# Patient Record
Sex: Female | Born: 1935 | Race: White | Hispanic: No | Smoking: Never smoker
Health system: Southern US, Community
[De-identification: ages and names within clinical notes are randomized; demographics above are authoritative.]

## PROBLEM LIST (undated history)

## (undated) DIAGNOSIS — D649 Anemia, unspecified: Secondary | ICD-10-CM

## (undated) DIAGNOSIS — H409 Unspecified glaucoma: Secondary | ICD-10-CM

## (undated) DIAGNOSIS — I6529 Occlusion and stenosis of unspecified carotid artery: Secondary | ICD-10-CM

## (undated) DIAGNOSIS — M810 Age-related osteoporosis without current pathological fracture: Secondary | ICD-10-CM

## (undated) DIAGNOSIS — N390 Urinary tract infection, site not specified: Secondary | ICD-10-CM

## (undated) DIAGNOSIS — M1990 Unspecified osteoarthritis, unspecified site: Secondary | ICD-10-CM

## (undated) DIAGNOSIS — E7800 Pure hypercholesterolemia, unspecified: Secondary | ICD-10-CM

## (undated) DIAGNOSIS — R55 Syncope and collapse: Secondary | ICD-10-CM

## (undated) DIAGNOSIS — I10 Essential (primary) hypertension: Secondary | ICD-10-CM

## (undated) DIAGNOSIS — J189 Pneumonia, unspecified organism: Secondary | ICD-10-CM

## (undated) DIAGNOSIS — H269 Unspecified cataract: Secondary | ICD-10-CM

## (undated) HISTORY — DX: Unspecified glaucoma: H40.9

## (undated) HISTORY — DX: Pneumonia, unspecified organism: J18.9

## (undated) HISTORY — DX: Anemia, unspecified: D64.9

## (undated) HISTORY — DX: Pure hypercholesterolemia, unspecified: E78.00

## (undated) HISTORY — DX: Unspecified osteoarthritis, unspecified site: M19.90

## (undated) HISTORY — DX: Urinary tract infection, site not specified: N39.0

## (undated) HISTORY — PX: APPENDECTOMY: SHX54

## (undated) HISTORY — PX: EYE SURGERY: SHX253

## (undated) HISTORY — DX: Syncope and collapse: R55

## (undated) HISTORY — DX: Unspecified cataract: H26.9

## (undated) HISTORY — DX: Age-related osteoporosis without current pathological fracture: M81.0

## (undated) HISTORY — DX: Essential (primary) hypertension: I10

## (undated) HISTORY — DX: Occlusion and stenosis of unspecified carotid artery: I65.29

---

## 1967-03-09 HISTORY — PX: ABDOMINAL HYSTERECTOMY: SHX81

## 1997-07-15 ENCOUNTER — Other Ambulatory Visit: Admission: RE | Admit: 1997-07-15 | Discharge: 1997-07-15 | Payer: Self-pay | Admitting: *Deleted

## 2003-10-10 ENCOUNTER — Ambulatory Visit (HOSPITAL_COMMUNITY): Admission: RE | Admit: 2003-10-10 | Discharge: 2003-10-10 | Payer: Self-pay | Admitting: Endocrinology

## 2011-02-10 ENCOUNTER — Ambulatory Visit (INDEPENDENT_AMBULATORY_CARE_PROVIDER_SITE_OTHER): Payer: MEDICARE

## 2011-02-10 DIAGNOSIS — J069 Acute upper respiratory infection, unspecified: Secondary | ICD-10-CM

## 2011-02-10 DIAGNOSIS — H6120 Impacted cerumen, unspecified ear: Secondary | ICD-10-CM

## 2011-11-28 ENCOUNTER — Ambulatory Visit (INDEPENDENT_AMBULATORY_CARE_PROVIDER_SITE_OTHER): Payer: MEDICARE | Admitting: Family Medicine

## 2011-11-28 ENCOUNTER — Ambulatory Visit: Payer: MEDICARE

## 2011-11-28 VITALS — BP 148/80 | HR 97 | Temp 98.0°F | Resp 16 | Ht 61.5 in | Wt 145.0 lb

## 2011-11-28 DIAGNOSIS — M545 Low back pain, unspecified: Secondary | ICD-10-CM

## 2011-11-28 DIAGNOSIS — R82998 Other abnormal findings in urine: Secondary | ICD-10-CM

## 2011-11-28 DIAGNOSIS — R300 Dysuria: Secondary | ICD-10-CM

## 2011-11-28 DIAGNOSIS — R8281 Pyuria: Secondary | ICD-10-CM

## 2011-11-28 LAB — POCT URINALYSIS DIPSTICK
Blood, UA: NEGATIVE
Glucose, UA: NEGATIVE
Nitrite, UA: NEGATIVE
Spec Grav, UA: >=1.03
Urobilinogen, UA: 0.2
pH, UA: 5.5

## 2011-11-28 LAB — POCT UA - MICROSCOPIC ONLY
Casts, Ur, LPF, POC: NEGATIVE
Crystals, Ur, HPF, POC: NEGATIVE
Epithelial cells, urine per micros: NEGATIVE
Mucus, UA: NEGATIVE
RBC, urine, microscopic: NEGATIVE
Yeast, UA: NEGATIVE

## 2011-11-28 MED ORDER — VALACYCLOVIR HCL 1 G PO TABS: 1000.0000 mg | ORAL_TABLET | Freq: Three times a day (TID) | ORAL | Status: DC

## 2011-11-28 NOTE — Patient Instructions (Signed)
Your low back pain is likely a flair of arthritis in the back.  You can take tylenol over the counter, or if stronger med needed, 1/2 to 1 of your Vicoprofen up to every 4 to 6 hours.  Drink plenty of fluids. If any rash - start Valtrex for possible shingles. Your should receive a call or letter about your lab results within the next week to 10 days. Return to the clinic or go to the nearest emergency room if any of your symptoms worsen or new symptoms occur.

## 2011-11-28 NOTE — Progress Notes (Signed)
Subjective:    Patient ID: Lisa [name], female    DOB: May 26, 1923, 78 y.o.   MRN: [id number]  HPI Lisa [name] is a 78 y.o. female Woke up 2 mornings ago - low back ache - across both sides lower back. Slight discomfort.  Took tylenol that afternoon - few episodes.  Localized pain that night to L side - worsened. Up and down that night.  Took ibuprofen that night. Yesterday - took vicoprofen at bedtime (leftover from dental problem).   S/p 2 doses.  Last one at around 3 am.  Pain has subsided for most part. No pain now.  Few ibuprofen this am. No bowel or bladder incontinence, no saddle anesthesia, no lower extremity weakness.  No rash, no burning or tingling on skin. Felt like muscle spasm type of pain.  Hx of shingles 5 or 6 years ago, but has had zostavax few years ago. This pain felt similar to shingles sx's in past, but no rash.   No known hx of compression fractures, but hx of Osteoporosis - taken Evista for few years.  No known hx of kidney stones.   Review of Systems  Constitutional: Negative for fever and chills.  Respiratory: Negative for chest tightness and shortness of breath.   Cardiovascular: Negative for chest pain.  Gastrointestinal: Negative for abdominal pain, blood in stool and abdominal distention.  Genitourinary: Positive for frequency (chronic - but may be going a little more than usual.). Negative for dysuria, urgency, hematuria, flank pain, vaginal bleeding and difficulty urinating.  Neurological: Negative for weakness.       Objective:   Physical Exam  Constitutional: She is oriented to person, place, and time. She appears well-developed and well-nourished. No distress.  HENT:  Head: Normocephalic and atraumatic.  Cardiovascular: Normal rate, regular rhythm, normal heart sounds and intact distal pulses.   Pulmonary/Chest: Effort normal and breath sounds normal. No respiratory distress.  Abdominal: Soft. There is no tenderness.  Musculoskeletal: Normal  range of motion.       Lumbar back: She exhibits normal range of motion, no tenderness, no bony tenderness, no swelling, no edema, no pain and no spasm.       Back:  Neurological: She is alert and oriented to person, place, and time. She has normal strength. No sensory deficit.  Reflex Scores:      Patellar reflexes are 2+ on the right side and 2+ on the left side.      Achilles reflexes are 2+ on the right side and 2+ on the left side. Skin: Skin is warm and dry. No rash noted. No erythema.  Psychiatric: She has a normal mood and affect. Her behavior is normal.      Results for orders placed in visit on 11/28/11  POCT UA - MICROSCOPIC ONLY      Component Value Range   WBC, Ur, HPF, POC 2-6     RBC, urine, microscopic neg     Bacteria, U Microscopic trace     Mucus, UA neg     Epithelial cells, urine per micros neg     Crystals, Ur, HPF, POC neg     Casts, Ur, LPF, POC neg     Yeast, UA neg    POCT URINALYSIS DIPSTICK      Component Value Range   Color, UA yellow     Clarity, UA clear     Glucose, UA neg     Bilirubin, UA small     Ketones, UA trace  Spec Grav, UA >=1.030     Blood, UA neg     pH, UA 5.5     Protein, UA trace     Urobilinogen, UA 0.2     Nitrite, UA neg     Leukocytes, UA small (1+)     [facility] reading (PRIMARY) by  Dr. Neva Seat: DDD, R apex scoliosis of lumbar spine.  Likely degenerative retrolisthesis of L5 - Grade 1.      Assessment & Plan:  Lisa [name] is a 78 y.o. female 1. Dysuria  POCT UA - Microscopic Only, POCT urinalysis dipstick  2. Low back pain  DG Lumbar Spine Complete, valACYclovir (VALTREX) 1000 MG tablet  3. Pyuria  Urine culture    Low back pain - with progression to L side.  No rash and hx of Zostavax makes zoster less likely, but if any rash noted - start VAltrex.  Likely DDD flair with degerative spondylolisthesis. Pain much improved in office. . Start with otc tylenol, sx care, but if increased pain - has Vicoprfen 7.5/200 -  1/2 to 1 Q 4-6 hrs prn.  Only slight increase in urinary frequency, Unlikely UTI, but will check urine cx. Hold on abx's for now. RTC precautions. All questions answered.    Patient Instructions  Your low back pain is likely a flair of arthritis in the back.  You can take tylenol over the counter, or if stronger med needed, 1/2 to 1 of your Vicoprofen up to every 4 to 6 hours.  Drink plenty of fluids. If any rash - start Valtrex for possible shingles. Your should receive a call or letter about your lab results within the next week to 10 days. Return to the clinic or go to the nearest emergency room if any of your symptoms worsen or new symptoms occur.

## 2011-11-30 LAB — URINE CULTURE
Colony Count: NO GROWTH
Organism ID, Bacteria: NO GROWTH

## 2013-01-31 ENCOUNTER — Ambulatory Visit (HOSPITAL_COMMUNITY)
Admission: RE | Admit: 2013-01-31 | Discharge: 2013-01-31 | Disposition: A | Discharge status: Home / Self Care | Payer: Medicare Other | Source: Ambulatory Visit | Attending: Family Medicine | Admitting: Family Medicine

## 2013-01-31 ENCOUNTER — Other Ambulatory Visit: Payer: Self-pay | Admitting: Physician Assistant

## 2013-01-31 ENCOUNTER — Ambulatory Visit (INDEPENDENT_AMBULATORY_CARE_PROVIDER_SITE_OTHER): Payer: Medicare Other | Admitting: Family Medicine

## 2013-01-31 ENCOUNTER — Other Ambulatory Visit (HOSPITAL_COMMUNITY): Payer: Self-pay | Admitting: Physician Assistant

## 2013-01-31 VITALS — BP 124/74 | HR 94 | Temp 98.4°F | Resp 18 | Wt 138.0 lb

## 2013-01-31 DIAGNOSIS — R7989 Other specified abnormal findings of blood chemistry: Secondary | ICD-10-CM

## 2013-01-31 DIAGNOSIS — M79604 Pain in right leg: Secondary | ICD-10-CM

## 2013-01-31 DIAGNOSIS — R600 Localized edema: Secondary | ICD-10-CM

## 2013-01-31 DIAGNOSIS — M7989 Other specified soft tissue disorders: Secondary | ICD-10-CM

## 2013-01-31 DIAGNOSIS — R799 Abnormal finding of blood chemistry, unspecified: Secondary | ICD-10-CM | POA: Insufficient documentation

## 2013-01-31 DIAGNOSIS — R609 Edema, unspecified: Secondary | ICD-10-CM | POA: Insufficient documentation

## 2013-01-31 LAB — POCT CBC
Granulocyte percent: 74.4 %G (ref 37–80)
HCT, POC: 36.4 % — AB (ref 37.7–47.9)
Hemoglobin: 11.3 g/dL — AB (ref 12.2–16.2)
Lymph, poc: 1 (ref 0.6–3.4)
MCH, POC: 32 pg — AB (ref 27–31.2)
MCHC: 31 g/dL — AB (ref 31.8–35.4)
MCV: 103 fL — AB (ref 80–97)
MID (cbc): 0.3 (ref 0–0.9)
MPV: 8.6 fL (ref 0–99.8)
POC Granulocyte: 3.8 (ref 2–6.9)
POC LYMPH PERCENT: 19.6 %L (ref 10–50)
POC MID %: 6 %M (ref 0–12)
Platelet Count, POC: 178 10*3/uL (ref 142–424)
RBC: 3.53 M/uL — AB (ref 4.04–5.48)
RDW, POC: 14.6 %
WBC: 5.1 10*3/uL (ref 4.6–10.2)

## 2013-01-31 LAB — D-DIMER, QUANTITATIVE: D-Dimer, Quant: 0.56 ug/mL-FEU — ABNORMAL HIGH (ref 0.00–0.48)

## 2013-01-31 NOTE — Progress Notes (Signed)
VASCULAR LAB PRELIMINARY  PRELIMINARY  PRELIMINARY  PRELIMINARY  Bilateral lower extremity venous duplex completed.    Preliminary report:  Bilateral:  No evidence of DVT, superficial thrombosis, or Baker's Cyst.   Jacklynn Dehaas, RVS 01/31/2013, 7:54 PM

## 2013-01-31 NOTE — Patient Instructions (Signed)
Wear the compression stockings  Keep legs elevated  Avoid excessive salt  Return if worse or so your primary physician

## 2013-01-31 NOTE — Progress Notes (Signed)
Subjective: Patient was at the beach last week, and probably got more salt in her diet computer. She's been home for a few days, but after a day or 2 at home she started having some edema her left leg. It goes down overnight, but swells in the daytime. She has not had swelling there in the past. She's not had any shortness of breath or chest pain or other major symptoms. Her daughter is with her today.  Objective: Alert pleasant lady. No JVD. Chest clear. Heart regular without murmurs. She has 2+ edema of her left ankle, 1+ on the right. Feet are warm and dry. Calves and thighs are nontender. No erythema.  Assessment: Left leg edema, new onset   Plan:  CBC D-dimer If her d-dimer is elevated she will need to get a Doppler ultrasound of her leg. Have no suspicion of pulmonary issues.

## 2013-11-23 ENCOUNTER — Other Ambulatory Visit: Payer: Self-pay | Admitting: Family Medicine

## 2013-11-23 DIAGNOSIS — R0989 Other specified symptoms and signs involving the circulatory and respiratory systems: Secondary | ICD-10-CM

## 2013-12-06 ENCOUNTER — Ambulatory Visit
Admission: RE | Admit: 2013-12-06 | Discharge: 2013-12-06 | Disposition: A | Discharge status: Home / Self Care | Payer: Medicare Other | Source: Ambulatory Visit | Attending: Family Medicine | Admitting: Family Medicine

## 2013-12-06 DIAGNOSIS — R0989 Other specified symptoms and signs involving the circulatory and respiratory systems: Secondary | ICD-10-CM

## 2013-12-14 ENCOUNTER — Encounter: Payer: Self-pay | Admitting: Vascular Surgery

## 2013-12-19 ENCOUNTER — Encounter: Payer: Self-pay | Admitting: Vascular Surgery

## 2013-12-20 ENCOUNTER — Encounter: Payer: Self-pay | Admitting: Vascular Surgery

## 2013-12-20 ENCOUNTER — Ambulatory Visit (INDEPENDENT_AMBULATORY_CARE_PROVIDER_SITE_OTHER): Payer: Medicare Other | Admitting: Vascular Surgery

## 2013-12-20 VITALS — BP 123/67 | HR 86 | Resp 14 | Ht 61.0 in | Wt 127.0 lb

## 2013-12-20 DIAGNOSIS — I6523 Occlusion and stenosis of bilateral carotid arteries: Secondary | ICD-10-CM

## 2013-12-20 DIAGNOSIS — I6529 Occlusion and stenosis of unspecified carotid artery: Secondary | ICD-10-CM | POA: Insufficient documentation

## 2013-12-20 DIAGNOSIS — I872 Venous insufficiency (chronic) (peripheral): Secondary | ICD-10-CM

## 2013-12-20 NOTE — Addendum Note (Signed)
Addended by: Sharee PimpleMCCHESNEY, Renley Gutman K on: 12/20/2013 03:33 PM   Modules accepted: Orders

## 2013-12-20 NOTE — Progress Notes (Signed)
New Carotid Patient  Referred by:  Junious SilkMichael D Altheimer, MD [facility] [place], [place] [id number]  Reason for referral: B carotid stenosis  History of Present Illness  Lisa [name] is a 80 y.o. (October 23, 1923) female who presents with chief complaint: abnormal carotid studies.  Previous carotid studies demonstrated: RICA <50% stenosis, LICA <50% stenosis.   Pt had B carotid duplex completed due to R carotid bruit.  Patient has no history of TIA or stroke symptom.  The patient has never had amaurosis fugax or monocular blindness.  The patient has never had facial drooping or hemiplegia.  The patient has never had receptive or expressive aphasia.   The patient's risks factors for carotid disease include: HTN, HLD.    Past Medical History  Diagnosis Date  . Arthritis   . Cataract   . Glaucoma   . Hypertension   . Osteoporosis    Hyperlipidemia   Past Surgical History  Procedure Laterality Date  . Appendectomy    . Eye surgery    . Abdominal hysterectomy      History   Social History  . Marital Status: Widowed    Spouse Name: N/A    Number of Children: N/A  . Years of Education: N/A   Occupational History  . Not on file.   Social History Main Topics  . Smoking status: Never Smoker   . Smokeless tobacco: Never Used  . Alcohol Use: No  . Drug Use: No  . Sexual Activity: Not on file   Other Topics Concern  . Not on file   Social History Narrative  . No narrative on file    Family History  Problem Relation Age of Onset  . Hypertension Mother   . Heart disease Mother   . Hyperlipidemia Brother   . Hypertension Brother     Current Outpatient Prescriptions on File Prior to Visit  Medication Sig Dispense Refill  . amLODipine (NORVASC) 2.5 MG tablet Take 2.5 mg by mouth daily.      . calcium carbonate (OS-CAL) 600 MG TABS Take 600 mg by mouth 2 (two) times daily with a meal.      . ezetimibe-simvastatin (VYTORIN) 10-40 MG per tablet Take 1 tablet by  mouth at bedtime.      . Multiple Vitamin (MULTIVITAMIN) tablet Take 1 tablet by mouth daily.      . raloxifene (EVISTA) 60 MG tablet Take 60 mg by mouth daily.      Marland Kitchen. telmisartan (MICARDIS) 80 MG tablet Take 80 mg by mouth daily.      . timolol (BETIMOL) 0.25 % ophthalmic solution Place 1-2 drops into both eyes daily.      . valACYclovir (VALTREX) 1000 MG tablet Take 1 tablet (1,000 mg total) by mouth 3 (three) times daily.  21 tablet  0   No current facility-administered medications on file prior to visit.    Allergies  Allergen Reactions  . Mycostatin [Nystatin] Swelling and Rash    REVIEW OF SYSTEMS:  (Positives checked otherwise negative)  CARDIOVASCULAR:  []  chest pain, []  chest pressure, []  palpitations, []  shortness of breath when laying flat, []  shortness of breath with exertion,  []  pain in feet when walking, []  pain in feet when laying flat, []  history of blood clot in veins (DVT), []  history of phlebitis, [x]  swelling in legs, [x]  varicose veins  PULMONARY:  []  productive cough, []  asthma, []  wheezing  NEUROLOGIC:  []  weakness in arms or legs, [x]  numbness in  arms or legs, []  difficulty speaking or slurred speech, []  temporary loss of vision in one eye, []  dizziness  HEMATOLOGIC:  []  bleeding problems, []  problems with blood clotting too easily  MUSCULOSKEL:  []  joint pain, []  joint swelling  GASTROINTEST:  []  vomiting blood, []  blood in stool     GENITOURINARY:  []  burning with urination, []  blood in urine  PSYCHIATRIC:  []  history of major depression  INTEGUMENTARY:  []  rashes, []  ulcers  CONSTITUTIONAL:  []  fever, []  chills  For VQI Use Only  PRE-ADM LIVING: Home  AMB STATUS: Ambulatory  CAD Sx: None  PRIOR CHF: None  STRESS TEST: [x]  No, [ ]  Normal, [ ]  + ischemia, [ ]  + MI, [ ]  Both  Physical Examination  Filed Vitals:   12/20/13 1357 12/20/13 1358  BP: 138/64 123/67  Pulse: 89 86  Resp: 14   Height: 5\' 1"  (1.549 m)   Weight: 127 lb (57.607  kg)    Body mass index is 24.01 kg/(m^2).  General: A&O x 3, WDWN  Head: [place]/AT  Ear/Nose/Throat: Hearing grossly intact, nares w/o erythema or drainage, oropharynx w/o Erythema/Exudate, Mallampati score: 2  Eyes: PERRLA, EOMI, Post surg chg to lenses  Neck: Supple, no nuchal rigidity, no palpable LAD  Pulmonary: Sym exp, good air movt, CTAB, no rales, rhonchi, & wheezing  Cardiac: RRR, Nl S1, S2, no Murmurs, rubs or gallops  Vascular: Vessel Right Left  Radial Faintly Palpable Faintly Palpable  Brachial Palpable Palpable  Carotid Palpable, without bruit Palpable, without bruit  Aorta  Not palpable N/A  Femoral Palpable Palpable  Popliteal Not palpable Not palpable  PT Not Palpable Not Palpable  DP Faintly Palpable Faintly Palpable   Gastrointestinal: soft, NTND, -G/R, - HSM, - masses, - CVAT B  Musculoskeletal: M/S 5/5 throughout , Extremities without ischemic changes , B varicose veins and spider veins, no LDS  Neurologic: CN 2-12 intact , Pain and light touch intact in extremities , Motor exam as listed above  Psychiatric: Judgment intact, Mood & affect appropriate for pt's clinical situation  Dermatologic: See M/S exam for extremity exam, no rashes otherwise noted  Lymph : No Cervical, Axillary, or Inguinal lymphadenopathy   Non-Invasive Vascular Imaging  Outside CAROTID DUPLEX (Date: 12/20/2013):  B carotid have plaque but no hemodynamically significant disease  Outside Studies/Documentation 8 pages of outside documents were reviewed including: outside carotid duplex, PCP chart.  Medical Decision Making  Lisa Canardancy O Proctor is a 80 y.o. female who presents with: asx B ICA stenosis <50%, chronic venous insufficiency (C2)   No evidence for any intervention in any patient with <50% carotid stenosis .  Based on the patient's vascular studies and examination, I have offered the patient: Annual B carotid duplex.  I discussed in depth with the patient the nature of  atherosclerosis, and emphasized the importance of maximal medical management including strict control of blood pressure, blood glucose, and lipid levels, obtaining regular exercise, antiplatelet agents, and cessation of smoking.   The patient is currently on a statin: Vytorin.  The patient is currently on an anti-platelet: ASA.  The patient is aware that without maximal medical management the underlying atherosclerotic disease process will progress, limiting the benefit of any interventions.  Thank you for allowing us to participate in this patient's care.  Leonides SakeBrian Jaycelyn Orrison, MD [facility] Office: [phone] Pager: [phone]  12/20/2013, 2:50 PM

## 2014-07-03 ENCOUNTER — Emergency Department (HOSPITAL_COMMUNITY): Payer: Medicare Other

## 2014-07-03 ENCOUNTER — Inpatient Hospital Stay (HOSPITAL_COMMUNITY)
Admission: EM | Admit: 2014-07-03 | Discharge: 2014-07-07 | DRG: 193 | Disposition: A | Discharge status: Skilled Nursing Facility | Payer: Medicare Other | Attending: Internal Medicine | Admitting: Internal Medicine

## 2014-07-03 ENCOUNTER — Encounter (HOSPITAL_COMMUNITY): Payer: Self-pay | Admitting: Emergency Medicine

## 2014-07-03 DIAGNOSIS — M810 Age-related osteoporosis without current pathological fracture: Secondary | ICD-10-CM | POA: Diagnosis present

## 2014-07-03 DIAGNOSIS — J9601 Acute respiratory failure with hypoxia: Secondary | ICD-10-CM | POA: Diagnosis present

## 2014-07-03 DIAGNOSIS — R42 Dizziness and giddiness: Secondary | ICD-10-CM

## 2014-07-03 DIAGNOSIS — E440 Moderate protein-calorie malnutrition: Secondary | ICD-10-CM | POA: Diagnosis present

## 2014-07-03 DIAGNOSIS — H409 Unspecified glaucoma: Secondary | ICD-10-CM | POA: Diagnosis present

## 2014-07-03 DIAGNOSIS — R5381 Other malaise: Secondary | ICD-10-CM

## 2014-07-03 DIAGNOSIS — I6529 Occlusion and stenosis of unspecified carotid artery: Secondary | ICD-10-CM | POA: Diagnosis present

## 2014-07-03 DIAGNOSIS — D649 Anemia, unspecified: Secondary | ICD-10-CM | POA: Diagnosis present

## 2014-07-03 DIAGNOSIS — J189 Pneumonia, unspecified organism: Secondary | ICD-10-CM | POA: Diagnosis not present

## 2014-07-03 DIAGNOSIS — I10 Essential (primary) hypertension: Secondary | ICD-10-CM | POA: Diagnosis present

## 2014-07-03 DIAGNOSIS — E860 Dehydration: Secondary | ICD-10-CM | POA: Diagnosis present

## 2014-07-03 DIAGNOSIS — R531 Weakness: Secondary | ICD-10-CM | POA: Diagnosis not present

## 2014-07-03 DIAGNOSIS — Z8249 Family history of ischemic heart disease and other diseases of the circulatory system: Secondary | ICD-10-CM

## 2014-07-03 DIAGNOSIS — R0902 Hypoxemia: Secondary | ICD-10-CM

## 2014-07-03 DIAGNOSIS — R109 Unspecified abdominal pain: Secondary | ICD-10-CM

## 2014-07-03 DIAGNOSIS — E785 Hyperlipidemia, unspecified: Secondary | ICD-10-CM | POA: Diagnosis present

## 2014-07-03 HISTORY — DX: Pneumonia, unspecified organism: J18.9

## 2014-07-03 LAB — URINALYSIS, ROUTINE W REFLEX MICROSCOPIC
Bilirubin Urine: NEGATIVE
Glucose, UA: NEGATIVE mg/dL
Hgb urine dipstick: NEGATIVE
Ketones, ur: 15 mg/dL — AB
Nitrite: NEGATIVE
Protein, ur: NEGATIVE mg/dL
Specific Gravity, Urine: 1.026 (ref 1.005–1.030)
Urobilinogen, UA: 0.2 mg/dL (ref 0.0–1.0)
pH: 5 (ref 5.0–8.0)

## 2014-07-03 LAB — COMPREHENSIVE METABOLIC PANEL
ALT: 24 U/L (ref 0–35)
AST: 23 U/L (ref 0–37)
Albumin: 3.4 g/dL — ABNORMAL LOW (ref 3.5–5.2)
Alkaline Phosphatase: 59 U/L (ref 39–117)
Anion gap: 7 (ref 5–15)
BUN: 23 mg/dL (ref 6–23)
CO2: 25 mmol/L (ref 19–32)
Calcium: 8.2 mg/dL — ABNORMAL LOW (ref 8.4–10.5)
Chloride: 104 mmol/L (ref 96–112)
Creatinine, Ser: 0.81 mg/dL (ref 0.50–1.10)
GFR calc Af Amer: 73 mL/min — ABNORMAL LOW (ref >90)
GFR calc non Af Amer: 63 mL/min — ABNORMAL LOW (ref >90)
Glucose, Bld: 163 mg/dL — ABNORMAL HIGH (ref 70–99)
Potassium: 4.3 mmol/L (ref 3.5–5.1)
Sodium: 136 mmol/L (ref 135–145)
Total Bilirubin: 0.4 mg/dL (ref 0.3–1.2)
Total Protein: 5.4 g/dL — ABNORMAL LOW (ref 6.0–8.3)

## 2014-07-03 LAB — CBC WITH DIFFERENTIAL/PLATELET
Basophils Absolute: 0 10*3/uL (ref 0.0–0.1)
Basophils Relative: 0 % (ref 0–1)
Eosinophils Absolute: 0 10*3/uL (ref 0.0–0.7)
Eosinophils Relative: 0 % (ref 0–5)
HCT: 30.7 % — ABNORMAL LOW (ref 36.0–46.0)
Hemoglobin: 10.4 g/dL — ABNORMAL LOW (ref 12.0–15.0)
Lymphocytes Relative: 5 % — ABNORMAL LOW (ref 12–46)
Lymphs Abs: 0.4 10*3/uL — ABNORMAL LOW (ref 0.7–4.0)
MCH: 32.4 pg (ref 26.0–34.0)
MCHC: 33.9 g/dL (ref 30.0–36.0)
MCV: 95.6 fL (ref 78.0–100.0)
Monocytes Absolute: 0.2 10*3/uL (ref 0.1–1.0)
Monocytes Relative: 2 % — ABNORMAL LOW (ref 3–12)
Neutro Abs: 7.1 10*3/uL (ref 1.7–7.7)
Neutrophils Relative %: 93 % — ABNORMAL HIGH (ref 43–77)
Platelets: 156 10*3/uL (ref 150–400)
RBC: 3.21 MIL/uL — ABNORMAL LOW (ref 3.87–5.11)
RDW: 12.7 % (ref 11.5–15.5)
WBC: 7.7 10*3/uL (ref 4.0–10.5)

## 2014-07-03 LAB — URINE MICROSCOPIC-ADD ON

## 2014-07-03 LAB — TROPONIN I: Troponin I: <0.03 ng/mL (ref <0.031)

## 2014-07-03 MED ORDER — SODIUM CHLORIDE 0.9 % IV BOLUS (SEPSIS)
1000.0000 mL | Freq: Once | INTRAVENOUS | Status: AC
  Administered 2014-07-03: 1000 mL via INTRAVENOUS

## 2014-07-03 MED ORDER — ASPIRIN 81 MG PO CHEW
324.0000 mg | CHEWABLE_TABLET | Freq: Once | ORAL | Status: AC
  Administered 2014-07-03: 324 mg via ORAL
  Filled 2014-07-03: qty 4

## 2014-07-03 MED ORDER — ONDANSETRON HCL 4 MG/2ML IJ SOLN
4.0000 mg | Freq: Once | INTRAMUSCULAR | Status: AC
  Administered 2014-07-03: 4 mg via INTRAVENOUS
  Filled 2014-07-03: qty 2

## 2014-07-03 MED ORDER — AZITHROMYCIN 500 MG IV SOLR
500.0000 mg | Freq: Once | INTRAVENOUS | Status: DC
  Administered 2014-07-04: 500 mg via INTRAVENOUS
  Filled 2014-07-03: qty 500

## 2014-07-03 MED ORDER — CEFTRIAXONE SODIUM 1 G IJ SOLR
1.0000 g | Freq: Once | INTRAMUSCULAR | Status: AC
  Administered 2014-07-04: 1 g via INTRAVENOUS
  Filled 2014-07-03: qty 10

## 2014-07-03 NOTE — ED Provider Notes (Signed)
CSN: [id number]     Arrival date & time 07/03/14  1712 History   First MD Initiated Contact with Patient 07/03/14 1741     Chief Complaint  Patient presents with  . Weakness     (Consider location/radiation/quality/duration/timing/severity/associated sxs/prior Treatment) Patient is a 81 y.o. female presenting with weakness.  Weakness This is a new problem. The current episode started 3 to 5 hours ago. The problem occurs constantly. The problem has not changed since onset.Pertinent negatives include no chest pain, no abdominal pain, no headaches and no shortness of breath. Associated symptoms comments: Nausea, vomiting x 2, lightheadedness.. Exacerbated by: standing up. Nothing relieves the symptoms. Treatments tried: zofran given by EMS. The treatment provided moderate relief.    Past Medical History  Diagnosis Date  . Arthritis   . Cataract   . Glaucoma   . Hypertension   . Osteoporosis    Past Surgical History  Procedure Laterality Date  . Appendectomy    . Eye surgery    . Abdominal hysterectomy     Family History  Problem Relation Age of Onset  . Hypertension Mother   . Heart disease Mother   . Hyperlipidemia Brother   . Hypertension Brother    History  Substance Use Topics  . Smoking status: Never Smoker   . Smokeless tobacco: Never Used  . Alcohol Use: No   OB History    No data available     Review of Systems  Respiratory: Negative for shortness of breath.   Cardiovascular: Negative for chest pain.  Gastrointestinal: Negative for abdominal pain.  Neurological: Positive for weakness. Negative for headaches.  All other systems reviewed and are negative.     Allergies  Mycostatin  Home Medications   Prior to Admission medications   Medication Sig Start Date End Date Taking? Authorizing Provider  amLODipine (NORVASC) 2.5 MG tablet Take 2.5 mg by mouth daily.    Historical Provider, MD  calcium carbonate (OS-CAL) 600 MG TABS Take 600 mg by mouth 2  (two) times daily with a meal.    Historical Provider, MD  ezetimibe-simvastatin (VYTORIN) 10-40 MG per tablet Take 1 tablet by mouth at bedtime.    Historical Provider, MD  Multiple Vitamin (MULTIVITAMIN) tablet Take 1 tablet by mouth daily.    Historical Provider, MD  raloxifene (EVISTA) 60 MG tablet Take 60 mg by mouth daily.    Historical Provider, MD  telmisartan (MICARDIS) 80 MG tablet Take 80 mg by mouth daily.    Historical Provider, MD  timolol (BETIMOL) 0.25 % ophthalmic solution Place 1-2 drops into both eyes daily.    Historical Provider, MD  timolol (TIMOPTIC) 0.5 % ophthalmic solution  12/07/13   Historical Provider, MD  valACYclovir (VALTREX) 1000 MG tablet Take 1 tablet (1,000 mg total) by mouth 3 (three) times daily. 11/28/11   Shade Flood, MD   BP 143/60 mmHg  Pulse 89  Temp(Src) 97.6 F (36.4 C) (Oral)  Resp 20  SpO2 97% Physical Exam  Constitutional: She is oriented to person, place, and time. She appears well-developed and well-nourished. No distress.  HENT:  Head: Normocephalic and atraumatic.  Mouth/Throat: Oropharynx is clear and moist.  Eyes: Conjunctivae are normal. Pupils are equal, round, and reactive to light. No scleral icterus.  Neck: Neck supple.  Cardiovascular: Normal rate, regular rhythm, normal heart sounds and intact distal pulses.   No murmur heard. Pulmonary/Chest: Effort normal and breath sounds normal. No stridor. No respiratory distress. She has no rales.  Abdominal:  Soft. Bowel sounds are normal. She exhibits no distension. There is no tenderness.  Musculoskeletal: Normal range of motion.  Neurological: She is alert and oriented to person, place, and time.  Skin: Skin is warm and dry. No rash noted.  Psychiatric: She has a normal mood and affect. Her behavior is normal.  Nursing note and vitals reviewed.   ED Course  Procedures (including critical care time) Labs Review Labs Reviewed  CBC WITH DIFFERENTIAL/PLATELET - Abnormal; Notable  for the following:    RBC 3.21 (*)    Hemoglobin 10.4 (*)    HCT 30.7 (*)    Neutrophils Relative % 93 (*)    Lymphocytes Relative 5 (*)    Lymphs Abs 0.4 (*)    Monocytes Relative 2 (*)    All other components within normal limits  COMPREHENSIVE METABOLIC PANEL - Abnormal; Notable for the following:    Glucose, Bld 163 (*)    Calcium 8.2 (*)    Total Protein 5.4 (*)    Albumin 3.4 (*)    GFR calc non Af Amer 63 (*)    GFR calc Af Amer 73 (*)    All other components within normal limits  URINALYSIS, ROUTINE W REFLEX MICROSCOPIC - Abnormal; Notable for the following:    APPearance HAZY (*)    Ketones, ur 15 (*)    Leukocytes, UA SMALL (*)    All other components within normal limits  URINE MICROSCOPIC-ADD ON - Abnormal; Notable for the following:    Casts HYALINE CASTS (*)    All other components within normal limits  CULTURE, BLOOD (ROUTINE X 2)  CULTURE, BLOOD (ROUTINE X 2)  TROPONIN I    Imaging Review Dg Chest Port 1 View  07/03/2014   CLINICAL DATA:  Syncope and shortness of breath for 1 day.  EXAM: PORTABLE CHEST - 1 VIEW  COMPARISON:  None.  FINDINGS: Mild enlargement of the cardiopericardial silhouette. No mediastinal or hilar masses or convincing adenopathy.  There are prominent markings in the lungs. Mild opacity at the right lung base partly silhouettes the hemidiaphragm. This may be atelectasis. A small area of pneumonia is possible. No other evidence of pneumonia and no pulmonary edema.  Bony thorax is demineralized but grossly intact.  IMPRESSION: Mild opacity at the right lung base. Pneumonia is possible but this is more likely scarring or atelectasis.  No other evidence of acute cardiopulmonary disease. Mild cardiomegaly.   Electronically Signed   By: Amie Portlandavid  Ormond M.D.   On: 07/03/2014 19:33  All radiology studies independently viewed by me.      EKG Interpretation   Date/Time:  Wednesday July 03 2014 17:18:03 EDT Ventricular Rate:  87 PR Interval:  188 QRS  Duration: 139 QT Interval:  406 QTC Calculation: 488 R Axis:   90 Text Interpretation:  Sinus rhythm LVH with secondary repolarization  abnormality Anterior infarct, old No old tracing to compare Confirmed by  [facility]  MD, TREY ([id number]) on 07/03/2014 6:04:17 PM     Pt reports hx of LBBB MDM   Final diagnoses:  Dizziness  Malaise  Hypoxia    81 yo female with generalized weakness and lightheadedness.  Symptoms started when she tried to get out of bed after a nap about 2pm.  She also reports taking her BP meds late today (around lunch time).    During ED course, her O2 sats dropped when taken off oxygen, and her HR increased to 110's.  Workup notable for no leukocytosis, negative troponin,  CXR with atelectasis vs pneumonia.  No significant cough or subjective SOB.  No fevers.  She will need to be admitted for her hypoxia.  Treated for CAP.  But also have ordered CTA to rule out PE.  Discussed with Dr. Allena Katz ([facility]) for admisison.    Blake Divine, MD 07/04/14 [phone]

## 2014-07-03 NOTE — ED Notes (Signed)
Patient if from Home took her morning Hypertension medication at 1400. States starting feeling dizzy, lighthead, and hot.Patient CBG with EMS 159 with negative orthostatics. Patient vomiting x2 with EMS which she received 4mg  of Zofran. No N/V since.

## 2014-07-04 ENCOUNTER — Encounter (HOSPITAL_COMMUNITY): Payer: Self-pay | Admitting: Radiology

## 2014-07-04 ENCOUNTER — Inpatient Hospital Stay (HOSPITAL_COMMUNITY): Payer: Medicare Other

## 2014-07-04 DIAGNOSIS — E860 Dehydration: Secondary | ICD-10-CM

## 2014-07-04 DIAGNOSIS — Z8249 Family history of ischemic heart disease and other diseases of the circulatory system: Secondary | ICD-10-CM | POA: Diagnosis not present

## 2014-07-04 DIAGNOSIS — R531 Weakness: Secondary | ICD-10-CM | POA: Diagnosis present

## 2014-07-04 DIAGNOSIS — I872 Venous insufficiency (chronic) (peripheral): Secondary | ICD-10-CM | POA: Diagnosis not present

## 2014-07-04 DIAGNOSIS — J9601 Acute respiratory failure with hypoxia: Secondary | ICD-10-CM | POA: Diagnosis present

## 2014-07-04 DIAGNOSIS — I6523 Occlusion and stenosis of bilateral carotid arteries: Secondary | ICD-10-CM | POA: Diagnosis not present

## 2014-07-04 DIAGNOSIS — H409 Unspecified glaucoma: Secondary | ICD-10-CM | POA: Diagnosis present

## 2014-07-04 DIAGNOSIS — E785 Hyperlipidemia, unspecified: Secondary | ICD-10-CM | POA: Diagnosis present

## 2014-07-04 DIAGNOSIS — D649 Anemia, unspecified: Secondary | ICD-10-CM | POA: Diagnosis present

## 2014-07-04 DIAGNOSIS — R112 Nausea with vomiting, unspecified: Secondary | ICD-10-CM

## 2014-07-04 DIAGNOSIS — I10 Essential (primary) hypertension: Secondary | ICD-10-CM

## 2014-07-04 DIAGNOSIS — R000 Tachycardia, unspecified: Secondary | ICD-10-CM

## 2014-07-04 DIAGNOSIS — J189 Pneumonia, unspecified organism: Secondary | ICD-10-CM | POA: Diagnosis present

## 2014-07-04 DIAGNOSIS — E440 Moderate protein-calorie malnutrition: Secondary | ICD-10-CM | POA: Diagnosis present

## 2014-07-04 DIAGNOSIS — R42 Dizziness and giddiness: Secondary | ICD-10-CM

## 2014-07-04 DIAGNOSIS — M810 Age-related osteoporosis without current pathological fracture: Secondary | ICD-10-CM | POA: Diagnosis present

## 2014-07-04 LAB — COMPREHENSIVE METABOLIC PANEL
ALT: 24 U/L (ref 0–35)
AST: 21 U/L (ref 0–37)
Albumin: 3.2 g/dL — ABNORMAL LOW (ref 3.5–5.2)
Alkaline Phosphatase: 54 U/L (ref 39–117)
Anion gap: 6 (ref 5–15)
BUN: 21 mg/dL (ref 6–23)
CO2: 25 mmol/L (ref 19–32)
Calcium: 8.3 mg/dL — ABNORMAL LOW (ref 8.4–10.5)
Chloride: 103 mmol/L (ref 96–112)
Creatinine, Ser: 0.7 mg/dL (ref 0.50–1.10)
GFR calc Af Amer: 87 mL/min — ABNORMAL LOW (ref >90)
GFR calc non Af Amer: 75 mL/min — ABNORMAL LOW (ref >90)
Glucose, Bld: 162 mg/dL — ABNORMAL HIGH (ref 70–99)
Potassium: 4.2 mmol/L (ref 3.5–5.1)
Sodium: 134 mmol/L — ABNORMAL LOW (ref 135–145)
Total Bilirubin: 0.3 mg/dL (ref 0.3–1.2)
Total Protein: 5.1 g/dL — ABNORMAL LOW (ref 6.0–8.3)

## 2014-07-04 LAB — CBC WITH DIFFERENTIAL/PLATELET
Basophils Absolute: 0 10*3/uL (ref 0.0–0.1)
Basophils Relative: 0 % (ref 0–1)
Eosinophils Absolute: 0 10*3/uL (ref 0.0–0.7)
Eosinophils Relative: 0 % (ref 0–5)
HCT: 30.1 % — ABNORMAL LOW (ref 36.0–46.0)
Hemoglobin: 10.3 g/dL — ABNORMAL LOW (ref 12.0–15.0)
Lymphocytes Relative: 3 % — ABNORMAL LOW (ref 12–46)
Lymphs Abs: 0.3 10*3/uL — ABNORMAL LOW (ref 0.7–4.0)
MCH: 32.6 pg (ref 26.0–34.0)
MCHC: 34.2 g/dL (ref 30.0–36.0)
MCV: 95.3 fL (ref 78.0–100.0)
Monocytes Absolute: 0.9 10*3/uL (ref 0.1–1.0)
Monocytes Relative: 7 % (ref 3–12)
Neutro Abs: 10.4 10*3/uL — ABNORMAL HIGH (ref 1.7–7.7)
Neutrophils Relative %: 90 % — ABNORMAL HIGH (ref 43–77)
Platelets: 163 10*3/uL (ref 150–400)
RBC: 3.16 MIL/uL — ABNORMAL LOW (ref 3.87–5.11)
RDW: 12.8 % (ref 11.5–15.5)
WBC: 11.6 10*3/uL — ABNORMAL HIGH (ref 4.0–10.5)

## 2014-07-04 LAB — STREP PNEUMONIAE URINARY ANTIGEN: Strep Pneumo Urinary Antigen: NEGATIVE

## 2014-07-04 LAB — RETICULOCYTES
RBC.: 3.16 MIL/uL — ABNORMAL LOW (ref 3.87–5.11)
Retic Count, Absolute: 34.8 10*3/uL (ref 19.0–186.0)
Retic Ct Pct: 1.1 % (ref 0.4–3.1)

## 2014-07-04 LAB — TROPONIN I: Troponin I: <0.03 ng/mL (ref <0.031)

## 2014-07-04 LAB — LACTIC ACID, PLASMA: Lactic Acid, Venous: 0.6 mmol/L (ref 0.5–2.0)

## 2014-07-04 LAB — LIPASE, BLOOD: Lipase: 20 U/L (ref 11–59)

## 2014-07-04 MED ORDER — METOPROLOL TARTRATE 12.5 MG HALF TABLET
12.5000 mg | ORAL_TABLET | Freq: Two times a day (BID) | ORAL | Status: DC
  Administered 2014-07-04 – 2014-07-07 (×8): 12.5 mg via ORAL
  Filled 2014-07-04 (×9): qty 1

## 2014-07-04 MED ORDER — RALOXIFENE HCL 60 MG PO TABS
60.0000 mg | ORAL_TABLET | Freq: Every day | ORAL | Status: DC
  Administered 2014-07-04 – 2014-07-06 (×3): 60 mg via ORAL
  Filled 2014-07-04 (×4): qty 1

## 2014-07-04 MED ORDER — LEVALBUTEROL HCL 0.63 MG/3ML IN NEBU
0.6300 mg | INHALATION_SOLUTION | RESPIRATORY_TRACT | Status: DC | PRN
  Filled 2014-07-04: qty 3

## 2014-07-04 MED ORDER — ASPIRIN EC 81 MG PO TBEC
81.0000 mg | DELAYED_RELEASE_TABLET | Freq: Every day | ORAL | Status: DC
  Administered 2014-07-04 – 2014-07-07 (×4): 81 mg via ORAL
  Filled 2014-07-04 (×4): qty 1

## 2014-07-04 MED ORDER — SODIUM CHLORIDE 0.9 % IV SOLN
INTRAVENOUS | Status: DC
  Administered 2014-07-04: 03:00:00 via INTRAVENOUS

## 2014-07-04 MED ORDER — ONDANSETRON HCL 4 MG/2ML IJ SOLN
4.0000 mg | Freq: Four times a day (QID) | INTRAMUSCULAR | Status: DC | PRN
  Administered 2014-07-04 – 2014-07-05 (×2): 4 mg via INTRAVENOUS
  Filled 2014-07-04 (×2): qty 2

## 2014-07-04 MED ORDER — TIMOLOL MALEATE 0.25 % OP SOLN
1.0000 [drp] | Freq: Every day | OPHTHALMIC | Status: DC
  Administered 2014-07-05 – 2014-07-07 (×3): 1 [drp] via OPHTHALMIC
  Filled 2014-07-04 (×2): qty 5

## 2014-07-04 MED ORDER — DEXTROSE 5 % IV SOLN
1.0000 g | INTRAVENOUS | Status: DC
Start: 2014-07-04 — End: 2014-07-07
  Administered 2014-07-04 – 2014-07-07 (×3): 1 g via INTRAVENOUS
  Filled 2014-07-04 (×5): qty 10

## 2014-07-04 MED ORDER — EZETIMIBE-SIMVASTATIN 10-40 MG PO TABS
1.0000 | ORAL_TABLET | Freq: Every day | ORAL | Status: DC
  Administered 2014-07-04 – 2014-07-06 (×3): 1 via ORAL
  Filled 2014-07-04 (×4): qty 1

## 2014-07-04 MED ORDER — BOOST / RESOURCE BREEZE PO LIQD
1.0000 | Freq: Three times a day (TID) | ORAL | Status: DC
  Administered 2014-07-04 – 2014-07-07 (×7): 1 via ORAL

## 2014-07-04 MED ORDER — SODIUM CHLORIDE 0.9 % IV BOLUS (SEPSIS)
250.0000 mL | Freq: Once | INTRAVENOUS | Status: AC
  Administered 2014-07-04: 250 mL via INTRAVENOUS

## 2014-07-04 MED ORDER — DEXTROSE 5 % IV SOLN
500.0000 mg | INTRAVENOUS | Status: DC
  Administered 2014-07-04 – 2014-07-06 (×3): 500 mg via INTRAVENOUS
  Filled 2014-07-04 (×5): qty 500

## 2014-07-04 MED ORDER — IOHEXOL 350 MG/ML SOLN
80.0000 mL | Freq: Once | INTRAVENOUS | Status: AC | PRN
  Administered 2014-07-04: 80 mL via INTRAVENOUS

## 2014-07-04 MED ORDER — IRBESARTAN 150 MG PO TABS
150.0000 mg | ORAL_TABLET | Freq: Every day | ORAL | Status: DC
  Filled 2014-07-04: qty 1

## 2014-07-04 MED ORDER — ENOXAPARIN SODIUM 30 MG/0.3ML ~~LOC~~ SOLN
30.0000 mg | SUBCUTANEOUS | Status: DC
  Administered 2014-07-04: 30 mg via SUBCUTANEOUS
  Filled 2014-07-04 (×2): qty 0.3

## 2014-07-04 MED ORDER — LEVALBUTEROL HCL 0.63 MG/3ML IN NEBU
0.6300 mg | INHALATION_SOLUTION | Freq: Four times a day (QID) | RESPIRATORY_TRACT | Status: DC
  Administered 2014-07-04: 0.63 mg via RESPIRATORY_TRACT

## 2014-07-04 MED ORDER — PANTOPRAZOLE SODIUM 40 MG IV SOLR
40.0000 mg | Freq: Two times a day (BID) | INTRAVENOUS | Status: DC
  Administered 2014-07-04 (×3): 40 mg via INTRAVENOUS
  Filled 2014-07-04 (×6): qty 40

## 2014-07-04 NOTE — Progress Notes (Signed)
Patient states is having some nausea. Paged Dr. Allena KatzPatel via text page. Awaiting page and or orders.

## 2014-07-04 NOTE — ED Notes (Signed)
Dr. Patel at bedside 

## 2014-07-04 NOTE — ED Notes (Signed)
Floor called and environmental there now to clean bed

## 2014-07-04 NOTE — Progress Notes (Signed)
Patient transferred from the ED via stretcher to room 3E10. Oriented and educated patient and family to the room, room equipment, and to the heart failure floor.VSS. Confirmed placement of telemetry leads and box number with CMT - Trina. Currently patient states no pain but is requesting something for nausea. Will notify doctor and continue to monitor patient to end of shift.

## 2014-07-04 NOTE — ED Notes (Signed)
Report given but bed and room needs to be cleaned.

## 2014-07-04 NOTE — Progress Notes (Signed)
I have seen and assessed patient and agree with Dr. Eliane DecreePatel's assessment and plan. Patient is a pleasant 81 year old female presented with nausea, vomiting and generalized weakness with dizziness and lightheadedness. Patient also noted to be hypoxic on room air. CT angiogram chest negative for PE however consent for possible bibasilar pneumonia versus atelectasis. Patient with some clinical improvement. Patient's blood pressure is borderline. Discontinue Avapro. Continue home dose Lopressor. Continue hydration with IV fluids. Continue empiric antibiotics to cover for probable community acquired pneumonia versus aspiration pneumonia. Will place on Xopenex nebs. Advance diet to clear liquid diet and advance as tolerated. Check orthostatics. PT/OT. Discussed CODE STATUS with patient and daughter and patient is a full code.

## 2014-07-04 NOTE — Evaluation (Addendum)
Physical Therapy Evaluation Patient Details Name: Lisa [name] MRN: [id number] DOB: 1923/05/07 Today's Date: 07/04/2014   History of Present Illness  Patient is an 81 yo female admitted 07/03/14 with fatigue, N/V, SOB.  Patient with CAP.   PMH:  HTN, dyslipidemia, arthritis  Clinical Impression  Patient presents with problems listed below.  Will benefit from acute PT to maximize mobility prior to discharge.  Patient with weakness and decreased mobility/gait.  Recommend SNF at discharge for continued therapy and 24 hour assist.   If patient can progress to Mod I level, may be able to return home with HHPT.    Follow Up Recommendations SNF;Supervision/Assistance - 24 hour    Equipment Recommendations  None recommended by PT    Recommendations for Other Services       Precautions / Restrictions Precautions Precautions: Fall Restrictions Weight Bearing Restrictions: No      Mobility  Bed Mobility Overal bed mobility: Needs Assistance Bed Mobility: Supine to Sit;Sit to Supine;Rolling Rolling: Min assist   Supine to sit: Min assist Sit to supine: Min guard   General bed mobility comments: Patient able to roll to both sides (removed bedpan and changed bed pad).  Uses rail.  Required assist to roll completely onto Rt side.  Assist to raise trunk to sitting position.  Once upright, patient c/o feeling lightheaded.  Able to sit EOB x 10 minutes.  Returned to supine with assist for safety only.  Transfers                 General transfer comment: Patient declined to stand due to lightheadedness and feeling "too weak".  Ambulation/Gait                Stairs            Wheelchair Mobility    Modified Rankin (Stroke Patients Only)       Balance                                             Pertinent Vitals/Pain Pain Assessment: No/denies pain    Home Living Family/patient expects to be discharged to:: Unsure Living Arrangements:  Children (Daughter) Available Help at Discharge: Family;Available PRN/intermittently Type of Home: House Home Access: Stairs to enter   [employer] of Steps: 2 Home Layout: Multi-level;Bed/bath upstairs (Split-level; laundry downstairs) Home Equipment: Environmental consultant - 4 wheels;Cane - single point;Shower seat;Bedside commode Additional Comments: Daughter works prn for school system.  Daughter moving to townhouse in June.  Patient reports she is on waiting list for [facility].  Daughter travels to [place] to see grandchild.  Neighborhood "teenager" stays with patient at night when daughter is away.    Prior Function Level of Independence: Independent with assistive device(s);Needs assistance   Gait / Transfers Assistance Needed: Ambulates with cane inside of house.  Uses rollator outside of home.  ADL's / Homemaking Assistance Needed: Assist with meals and housekeeping        Hand Dominance        Extremity/Trunk Assessment   Upper Extremity Assessment: Generalized weakness           Lower Extremity Assessment: Generalized weakness      Cervical / Trunk Assessment: Kyphotic  Communication   Communication: HOH  Cognition Arousal/Alertness: Awake/alert Behavior During Therapy: Anxious Overall Cognitive Status: Within Functional Limits for tasks assessed  General Comments      Exercises        Assessment/Plan    PT Assessment Patient needs continued PT services  PT Diagnosis Difficulty walking;Generalized weakness   PT Problem List Decreased strength;Decreased activity tolerance;Decreased balance;Decreased mobility;Decreased knowledge of use of DME;Cardiopulmonary status limiting activity  PT Treatment Interventions Gait training;DME instruction;Functional mobility training;Therapeutic activities;Patient/family education;Stair training   PT Goals (Current goals can be found in the Care Plan section) Acute Rehab PT  Goals Patient Stated Goal: To get stronger PT Goal Formulation: With patient Time For Goal Achievement: 07/18/14 Potential to Achieve Goals: Good    Frequency Min 3X/week   Barriers to discharge Decreased caregiver support Patient does not have 24 hour assist    Co-evaluation               End of Session Equipment Utilized During Treatment: Oxygen Activity Tolerance: Patient limited by fatigue (Limited by lightheadedness) Patient left: in bed;with call bell/phone within reach [facility](HOB elevated) Nurse Communication: Mobility status (O2 sats remained 93-97% during mobility)         Time: 1720-1744 PT Time Calculation (min) (ACUTE ONLY): 24 min   Charges:   PT Evaluation $Initial PT Evaluation Tier I: 1 Procedure PT Treatments $Therapeutic Activity: 8-22 mins   PT G CodesVena Austria:        June Vacha H 07/04/2014, 6:19 PM Durenda HurtSusan H. Renaldo Fiddleravis, PT, [facility] Acute Rehab Services Pager [phone]

## 2014-07-04 NOTE — Care Management Note (Unsigned)
    Page 1 of 1   07/05/2014     4:19:56 PM CARE MANAGEMENT NOTE 07/05/2014  Patient:  Lisa [name],Lisa [name]   Account Number:  [id number]  Date Initiated:  07/04/2014  Documentation initiated by:  Karmel Patricelli  Subjective/Objective Assessment:   Pt adm on 07/03/14 with vomiting, PNA.  PTA, pt reisdes at home with daughter.  [facility]     Action/Plan:   PT/OT evals pending.  Will follow for home needs as pt progresses.   Anticipated DC Date:  07/07/2014   Anticipated DC Plan:  SKILLED NURSING FACILITY  In-house referral  Clinical Social Worker      DC Planning Services  CM consult      Choice offered to / List presented to:             Status of service:  In process, will continue to follow Medicare Important Message given?   (If response is "NO", the following Medicare IM given date fields will be blank) Date Medicare IM given:   Medicare IM given by:   Date Additional Medicare IM given:   Additional Medicare IM given by:    Discharge Disposition:    Per UR Regulation:  Reviewed for med. necessity/level of care/duration of stay  If discussed at Long Length of Stay Meetings, dates discussed:    Comments:  07/05/14 Sidney AceJulie Melane Windholz, RN, BSN [phone] PT recommending SNF at dc; referral to CSW to facilitate dc to SNF when medically stable for dc.

## 2014-07-04 NOTE — Evaluation (Signed)
Clinical/Bedside Swallow Evaluation Patient Details  Name: Lisa [name] MRN: [id number] Date of Birth: 1923/12/29  Today's Date: 07/04/2014 Time: SLP Start Time (ACUTE ONLY): 0935 SLP Stop Time (ACUTE ONLY): 0956 SLP Time Calculation (min) (ACUTE ONLY): 21 min  Past Medical History:  Past Medical History  Diagnosis Date  . Arthritis   . Cataract   . Glaucoma   . Hypertension   . Osteoporosis    Past Surgical History:  Past Surgical History  Procedure Laterality Date  . Appendectomy    . Eye surgery    . Abdominal hysterectomy     HPI:  Lisa [name] is a 81 y.o. female with Past medical history of hypertension, dyslipidemia. The patient is presents with complaints of fatigue as well as nausea vomiting and shortness of breath. MD suspects CAP with bibasilar atelectasis vs pna on xray.    Assessment / Plan / Recommendation Clinical Impression  Pt with no clinical signs of aspiration with thin liquids or jello. Pt still feeling sick to her stomach and refused solid trials, though given her strength and adequate arousal, would expect pt to tolerate solids well as she does at baseline. Given presentation and no report of difficulty at home, risk of aspiration relatively low. Pt may continue thin liquids and upgrade diet as tolerated. No SLP f/u needed, will sign off.      Aspiration Risk  Mild    Diet Recommendation Regular;Thin liquid   Liquid Administration via: Cup;Straw Medication Administration: Whole meds with liquid Supervision: Patient able to self feed Postural Changes and/or Swallow Maneuvers: Seated upright 90 degrees    Other  Recommendations Oral Care Recommendations: Oral care BID   Follow Up Recommendations  None    Frequency and Duration        Pertinent Vitals/Pain NA    SLP Swallow Goals     Swallow Study Prior Functional Status       General HPI: Lisa [name] is a 81 y.o. female with Past medical history of hypertension, dyslipidemia.  The patient is presents with complaints of fatigue as well as nausea vomiting and shortness of breath. MD suspects CAP with bibasilar atelectasis vs pna on xray.  Type of Study: Bedside swallow evaluation Previous Swallow Assessment: none Diet Prior to this Study: Thin liquids Temperature Spikes Noted: No Respiratory Status: Room air History of Recent Intubation: No Behavior/Cognition: Alert;Cooperative;Pleasant mood Oral Cavity - Dentition: Dentures, bottom;Missing dentition;Dentures, not available Self-Feeding Abilities: Able to feed self Patient Positioning: Upright in bed Baseline Vocal Quality: Clear Volitional Cough: Strong Volitional Swallow: Able to elicit    Oral/Motor/Sensory Function Overall Oral Motor/Sensory Function: Appears within functional limits for tasks assessed   Ice Chips     Thin Liquid Thin Liquid: Within functional limits    Nectar Thick Nectar Thick Liquid: Not tested   Honey Thick Honey Thick Liquid: Not tested   Puree Puree: Within functional limits   Solid   GO    Solid: Not tested       Claudine MoutonDeBlois, Maxmillian Carsey Caroline 07/04/2014,10:08 AM

## 2014-07-04 NOTE — ED Notes (Addendum)
Dr Allena KatzPatel stated should finish the Zithromax that is hanging.

## 2014-07-04 NOTE — H&P (Signed)
[facility] History and Physical  Patient: Lisa [name]  MRN: [id number]  DOB: 1924-02-03  DOS: the patient was seen and examined on 07/04/2014 PCP: [name], MD  Referring physician: Dr. Loretha Stapler Chief Complaint: Fatigue  HPI: Lisa [name] is a 81 y.o. female with Past medical history of hypertension, dyslipidemia. The patient is presenting with complaints of fatigue as well as nausea vomiting and shortness of breath. Patient was at her baseline earlier in the morning, she lives with her daughter, history was obtained on patient as well as daughter. After the lunch the patient has taken her blood pressure medications which she generally takes it on 11 AM. After that she slept and when she woke up she felt dizzy and lightheaded. She also for warm. She also had some generalized fatigue and weakness and therefore they called EMS as her symptoms were not resolving. When EMS found her she was hypoxic. She also had an episode of vomiting 2 with the EMS and received Zofran. At the time of my evaluation she continues to have complains of nausea and vomiting as well as mild epigastric discomfort. She denies any chest pain chest heaviness chest tightness. No recent hospitalization or immobilization. She denies any focal deficit. She has chronic carpal tunnel on the right arm. She denies any diarrhea or constipation does not have any active bleeding does not have the burning urination.  The patient is coming from home. And at her baseline independent for most of her ADL.  Review of Systems: as mentioned in the history of present illness.  A comprehensive review of the other systems is negative.  Past Medical History  Diagnosis Date  . Arthritis   . Cataract   . Glaucoma   . Hypertension   . Osteoporosis    Past Surgical History  Procedure Laterality Date  . Appendectomy    . Eye surgery    . Abdominal hysterectomy     Social History:  reports that she has  never smoked. She has never used smokeless tobacco. She reports that she does not drink alcohol or use illicit drugs.  Allergies  Allergen Reactions  . Mycostatin [Nystatin] Swelling and Rash    Family History  Problem Relation Age of Onset  . Hypertension Mother   . Heart disease Mother   . Hyperlipidemia Brother   . Hypertension Brother     Prior to Admission medications   Medication Sig Start Date End Date Taking? Authorizing Provider  amLODipine (NORVASC) 2.5 MG tablet Take 2.5 mg by mouth daily.   Yes Historical Provider, MD  aspirin EC 81 MG tablet Take 81 mg by mouth daily.   Yes Historical Provider, MD  calcium carbonate (OS-CAL) 600 MG TABS Take 600 mg by mouth 2 (two) times daily with a meal.   Yes Historical Provider, MD  Cholecalciferol (VITAMIN D PO) Take 1 tablet by mouth daily.   Yes Historical Provider, MD  ezetimibe-simvastatin (VYTORIN) 10-40 MG per tablet Take 1 tablet by mouth at bedtime.   Yes Historical Provider, MD  Multiple Vitamin (MULTIVITAMIN) tablet Take 1 tablet by mouth daily.   Yes Historical Provider, MD  raloxifene (EVISTA) 60 MG tablet Take 60 mg by mouth at bedtime.    Yes Historical Provider, MD  telmisartan (MICARDIS) 20 MG tablet Take 60 mg by mouth daily.   Yes Historical Provider, MD  timolol (BETIMOL) 0.25 % ophthalmic solution Place 1-2 drops into both eyes daily.   Yes Historical Provider, MD  valACYclovir (VALTREX) 1000  MG tablet Take 1 tablet (1,000 mg total) by mouth 3 (three) times daily. 11/28/11   Shade Flood, MD    Physical Exam: Filed Vitals:   07/03/14 2330 07/04/14 0025 07/04/14 0039 07/04/14 0045  BP: 112/41  119/47 119/43  Pulse: 97  102 103  Temp:  97.1 F (36.2 C)    TempSrc:  Rectal    Resp: SpO2: 98%  97% 95%    General: Alert, Awake and Oriented to Time, Place and Person. Appear in mild distress Eyes: PERRL ENT: Oral Mucosa clear moist. Neck: no JVD Cardiovascular: S1 and S2 Present, aortic  systolic Murmur, Peripheral Pulses Present Respiratory: Bilateral Air entry equal and Decreased,  basal Crackles, no wheezes Abdomen: Bowel Sound present, Soft and mild epigastric tender Skin: no Rash Extremities: Bilateral trace Pedal edema, no calf tenderness Neurologic: Grossly no focal neuro deficit.  Labs on Admission:  CBC:  Recent Labs Lab 07/03/14 1941  WBC 7.7  NEUTROABS 7.1  HGB 10.4*  HCT 30.7*  MCV 95.6  PLT 156    CMP     Component Value Date/Time   NA 136 07/03/2014 1941   K 4.3 07/03/2014 1941   CL 104 07/03/2014 1941   CO2 25 07/03/2014 1941   GLUCOSE 163* 07/03/2014 1941   BUN 23 07/03/2014 1941   CREATININE 0.81 07/03/2014 1941   CALCIUM 8.2* 07/03/2014 1941   PROT 5.4* 07/03/2014 1941   ALBUMIN 3.4* 07/03/2014 1941   AST 23 07/03/2014 1941   ALT 24 07/03/2014 1941   ALKPHOS 59 07/03/2014 1941   BILITOT 0.4 07/03/2014 1941   GFRNONAA 63* 07/03/2014 1941   GFRAA 73* 07/03/2014 1941    No results for input(s): LIPASE, AMYLASE in the last 168 hours.   Recent Labs Lab 07/03/14 1941  TROPONINI <0.03   BNP (last 3 results) No results for input(s): BNP in the last 8760 hours.  ProBNP (last 3 results) No results for input(s): PROBNP in the last 8760 hours.   Radiological Exams on Admission: Ct Angio Chest Pe W/cm &/or Wo Cm  07/04/2014   CLINICAL DATA:  Acute onset of nausea, vomiting, dizziness and lightheadedness. Initial encounter.  EXAM: CT ANGIOGRAPHY CHEST WITH CONTRAST  TECHNIQUE: Multidetector CT imaging of the chest was performed using the standard protocol during bolus administration of intravenous contrast. Multiplanar CT image reconstructions and MIPs were obtained to evaluate the vascular anatomy.  CONTRAST:  80mL OMNIPAQUE IOHEXOL 350 MG/ML SOLN  COMPARISON:  Chest radiograph performed 07/03/2014  FINDINGS: There is no evidence of pulmonary embolus.  Dependent bilateral lower lobe airspace opacification may reflect atelectasis or  pneumonia. Scattered peripheral nodular densities likely reflect atelectasis. Scattered bilateral calcified granulomata are seen. There is no evidence of pleural effusion or pneumothorax. No masses are identified; no abnormal focal contrast enhancement is seen.  Calcified nodes are noted at the subcarinal region, and about the distal esophagus. No mediastinal lymphadenopathy is seen. No pericardial effusion is identified. Scattered calcification is noted along the aortic arch. The great vessels are grossly unremarkable in appearance. No axillary lymphadenopathy is seen. The visualized portions of the thyroid gland are unremarkable in appearance.  The visualized portions of the liver and spleen are unremarkable. The visualized portions of the pancreas, gallbladder, stomach, adrenal glands and kidneys are within normal limits. Scattered diverticulosis is noted at the splenic flexure of the colon.  No acute osseous abnormalities are seen. There is mild chronic anterior fusion of multiple thoracic vertebral bodies.  Review of the MIP images confirms the above findings.  IMPRESSION: 1. No evidence of pulmonary embolus. 2. Dependent bilateral lower lobe airspace opacification may reflect atelectasis or pneumonia. 3. Calcified nodes at the subcarinal region, and about the distal esophagus, likely reflecting remote granulomatous disease. No mediastinal lymphadenopathy seen. 4. Scattered diverticulosis at the splenic flexure of the colon. 5. Mild chronic anterior fusion of multiple thoracic vertebral bodies.   Electronically Signed   By: Roanna RaiderJeffery  Chang M.D.   On: 07/04/2014 00:25   Dg Chest Port 1 View  07/03/2014   CLINICAL DATA:  Syncope and shortness of breath for 1 day.  EXAM: PORTABLE CHEST - 1 VIEW  COMPARISON:  None.  FINDINGS: Mild enlargement of the cardiopericardial silhouette. No mediastinal or hilar masses or convincing adenopathy.  There are prominent markings in the lungs. Mild opacity at the right lung base  partly silhouettes the hemidiaphragm. This may be atelectasis. A small area of pneumonia is possible. No other evidence of pneumonia and no pulmonary edema.  Bony thorax is demineralized but grossly intact.  IMPRESSION: Mild opacity at the right lung base. Pneumonia is possible but this is more likely scarring or atelectasis.  No other evidence of acute cardiopulmonary disease. Mild cardiomegaly.   Electronically Signed   By: Amie Portlandavid  Ormond M.D.   On: 07/03/2014 19:33   EKG: Independently reviewed. normal sinus rhythm, LBBB, family mentions she has long-standing LBBB.  Assessment/Plan Principal Problem:   CAP (community acquired pneumonia) Active Problems:   Asymptomatic carotid artery stenosis without infarction   Chronic venous insufficiency   Acute respiratory failure with hypoxia   Hypertension   Nausea with vomiting   Dizziness and giddiness   1. CAP (community acquired pneumonia)  Acute hypoxia  The patient is presenting with complaints of shortness of breath nausea vomiting and fatigue. This is a sudden onset symptoms. Patient is significantly hypoxic requiring 3 L of oxygen to maintain adequate saturation Her chest x-ray suggests possible atelectasis versus pneumonia. Probability of aspiration is high. At present she remains nothing by mouth we will get speech therapy involved. She'll be treated with ceftriaxone azithromycin. Follow cultures. I would also use incentive spirometry.  2. Nausea and vomiting. X-ray of the abdomen does not show any evidence of free air or any other acute bowel issues. Lactic acid and lipase levels are negative. Allergies unremarkable. Probably gastritis. Will use Zofran as needed as well as Protonix every 12 hours. Continue close monitoring.  3. Dizziness. Patient has occasional PVCs on rhythm. She does not have any focal deficit on exam. Will check orthostatic in the morning. I would hold her amlodipine and add Lopressor in view of PVC as  well as bundle branch block. She does have history of bilateral carotid stenosis which is moderate. We will continue aspirin and monitor serial neuro checks as well as monitor on telemetry.  4. Chronic anemia. Continue close monitoring. H&H remained stable. Continue iron supplements.  Advance goals of care discussion: Full code as per my discussion with patient and her daughter. Patient's daughter will be power of attorney.   DVT Prophylaxis: subcutaneous Heparin Nutrition: Nothing by mouth  Family Communication: family was present at bedside, opportunity was given to ask question and all questions were answered satisfactorily at the time of interview. Disposition: Admitted as inpatient, telemetry unit.  Author: Lynden OxfordPranav Theophilus Walz, MD [facility] Pager: [phone] 07/04/2014  If 7PM-7AM, please contact night-coverage [email or web address] Password [email or web address]

## 2014-07-04 NOTE — Progress Notes (Signed)
Initial Nutrition Assessment  DOCUMENTATION CODES:  Non-severe (moderate) malnutrition in context of chronic illness  INTERVENTION:  Boost Breeze TID, each supplement provides 250 kcal and 9 grams of protein Encourage PO intake  NUTRITION DIAGNOSIS:  Predicted suboptimal nutrient intake related to nausea as evidenced by per patient/family report.   GOAL:  Patient will meet greater than or equal to 90% of their needs   MONITOR:  PO intake, Supplement acceptance, Weight trends, Labs  REASON FOR ASSESSMENT:  Malnutrition Screening Tool    ASSESSMENT: 81 y.o. female with Past medical history of hypertension, dyslipidemia. The patient is presenting with complaints of fatigue as well as nausea vomiting and shortness of breath.  Pt reports that she usually eats 6 small meals daily but, yesterday she developed nausea and vomiting and was unable to eat. Today she is on clear liquids and has only eaten a few bites of jello due to nausea and being nervous to eat. She reports losing 15 lbs in the past 2 years due to decreased appetite. She drinks Ensure or Boost supplements on occasion depending on how she has been eating.   Height:  Ht Readings from Last 1 Encounters:  07/04/14 5\' 1"  (1.549 m)    Weight:  Wt Readings from Last 1 Encounters:  07/04/14 121 lb 11.1 oz (55.2 kg)    Ideal Body Weight:  105 lbs  Wt Readings from Last 10 Encounters:  07/04/14 121 lb 11.1 oz (55.2 kg)  12/20/13 127 lb (57.607 kg)  01/31/13 138 lb (62.596 kg)  11/28/11 145 lb (65.772 kg)    BMI:  Body mass index is 23.01 kg/(m^2).  Estimated Nutritional Needs:  Kcal:  1300-1500  Protein:  65-75 grams  Fluid:  1.3-1.5 L/day  Skin:  Reviewed, no issues  Diet Order:  Diet clear liquid Room service appropriate?: Yes; Fluid consistency:: Thin  EDUCATION NEEDS:  No education needs identified at this time   Intake/Output Summary (Last 24 hours) at 07/04/14 1406 Last data filed at  07/04/14 0824  Gross per 24 hour  Intake    300 ml  Output    300 ml  Net      0 ml    Last BM:  4/27   Ian Malkineanne Barnett RD, LDN Inpatient Clinical Dietitian Pager: [phone] After Hours Pager: [phone]

## 2014-07-05 LAB — LEGIONELLA ANTIGEN, URINE

## 2014-07-05 LAB — BASIC METABOLIC PANEL
Anion gap: 7 (ref 5–15)
BUN: 13 mg/dL (ref 6–23)
CO2: 25 mmol/L (ref 19–32)
Calcium: 8.2 mg/dL — ABNORMAL LOW (ref 8.4–10.5)
Chloride: 105 mmol/L (ref 96–112)
Creatinine, Ser: 0.69 mg/dL (ref 0.50–1.10)
GFR calc Af Amer: 87 mL/min — ABNORMAL LOW (ref >90)
GFR calc non Af Amer: 75 mL/min — ABNORMAL LOW (ref >90)
Glucose, Bld: 100 mg/dL — ABNORMAL HIGH (ref 70–99)
Potassium: 4.4 mmol/L (ref 3.5–5.1)
Sodium: 137 mmol/L (ref 135–145)

## 2014-07-05 LAB — CBC
HCT: 27.4 % — ABNORMAL LOW (ref 36.0–46.0)
Hemoglobin: 9.2 g/dL — ABNORMAL LOW (ref 12.0–15.0)
MCH: 33 pg (ref 26.0–34.0)
MCHC: 33.6 g/dL (ref 30.0–36.0)
MCV: 98.2 fL (ref 78.0–100.0)
Platelets: 136 10*3/uL — ABNORMAL LOW (ref 150–400)
RBC: 2.79 MIL/uL — ABNORMAL LOW (ref 3.87–5.11)
RDW: 13.1 % (ref 11.5–15.5)
WBC: 7.2 10*3/uL (ref 4.0–10.5)

## 2014-07-05 MED ORDER — PANTOPRAZOLE SODIUM 40 MG PO TBEC
40.0000 mg | DELAYED_RELEASE_TABLET | Freq: Two times a day (BID) | ORAL | Status: DC
  Administered 2014-07-05: 40 mg via ORAL
  Filled 2014-07-05: qty 1

## 2014-07-05 MED ORDER — ENOXAPARIN SODIUM 40 MG/0.4ML ~~LOC~~ SOLN
40.0000 mg | SUBCUTANEOUS | Status: DC
  Administered 2014-07-05 – 2014-07-07 (×3): 40 mg via SUBCUTANEOUS
  Filled 2014-07-05 (×3): qty 0.4

## 2014-07-05 MED ORDER — SENNOSIDES-DOCUSATE SODIUM 8.6-50 MG PO TABS
2.0000 | ORAL_TABLET | Freq: Two times a day (BID) | ORAL | Status: DC
  Administered 2014-07-05 – 2014-07-06 (×3): 2 via ORAL
  Filled 2014-07-05 (×6): qty 2

## 2014-07-05 NOTE — Clinical Social Work Note (Signed)
Clinical Social Work Assessment  Patient Details  Name: Lisa [name] MRN: [id number] Date of Birth: 1923-07-12  Date of referral:  07/05/14               Reason for consult:  Facility Placement, Discharge Planning                Permission sought to share information with:  Facility Industrial/product designerContact Representative Permission granted to share information::  Yes, Verbal Permission Granted  Name::     [facility]   Housing/Transportation Living arrangements for the past 2 months:  Single Family Home Source of Information:  Patient, Adult Children Patient Interpreter Needed:  None Criminal Activity/Legal Involvement Pertinent to Current Situation/Hospitalization:  No - Comment as needed Significant Relationships:  Adult Children, Community Support, Church Lives with:  Adult Children Do you feel safe going back to the place where you live?  No Need for family participation in patient care:  No (Coment) (Not needed but pt requested)  Care giving concerns:  Pt daughter concerned about pt mobility and safety at home   Social Worker assessment / plan:  CSW visited pt room and spoke with pt and pt daughter at bedside. Pt very pleasant but deferred all medical/discharge conversations to there daughter. Pt daughter explained to CSW that pt [facility] and she is hoping pt can dc to their SNF at dc. CSW explained that typically this facility is unable to accept residents outside of their community. CSW did agree to call facility to confirm and also provide pt daughter with contact information.   CSW confirmed with [facility] that they cannot accept pt. CSW provided alternative bed offers to pt daughter who confirmed she would [facility]. CSW did notify of plan for weekend dc. Facility confirmed they can accept over weekend and CSW did receive insurance authorization for weekend  discharge.   Employment status:  Retired Database administratornsurance information:  Managed Medicare PT Recommendations:  Skilled Nursing Facility Information / Referral to community resources:  Skilled Nursing Facility  Patient/Family's Response to care:  Pt and pt daughter in agreement that SNF will be safest option at [facility] for [facility] re[facility]  Patient/Family's Understanding of and Emotional Response to Diagnosis, Current Treatment, and Prognosis:  Pt and pt daughter both calm through out conversation. Pt with limited understanding of medication condition as she defers to her daughter. Pt daughter with good understanding of pt diagnosis and also long term needs.   Emotional Assessment Appearance:  Appears stated age, Well-Groomed Attitude/Demeanor/Rapport:    Affect (typically observed):  Appropriate, Happy, Pleasant Orientation:  Oriented to Self, Oriented to Place, Oriented to  Time, Oriented to Situation Alcohol / Substance use:  Not Applicable Psych involvement (Current and /or in the community):  No (Comment)  Discharge Needs  Concerns to be addressed:  Discharge Planning Concerns Readmission within the last 30 days:  No Current discharge risk:  Dependent with Mobility Barriers to Discharge:  Continued Medical Work up   [employer] BlockPoonum Shraddha Lebron, [employer]CSWA [phone]

## 2014-07-05 NOTE — Evaluation (Signed)
Occupational Therapy Evaluation Patient Details Name: Lisa [name] MRN: [id number] DOB: Nov 05, 1923 Today's Date: 07/05/2014    History of Present Illness Patient is an 81 yo female admitted 07/03/14 with fatigue, N/V, SOB.  Patient with CAP.   PMH:  HTN, dyslipidemia, arthritis   Clinical Impression   Pt was performing self care at a modified independent level PTA. Pt currently demonstrates deficits in balance and generalized weakness interfering with ability to perform ADL and ADL transfers.  Pt needs 24 hour assist and ST rehab prior to return home.  Will defer further OT to SNF.    Follow Up Recommendations  SNF;Supervision/Assistance - 24 hour    Equipment Recommendations  None recommended by OT    Recommendations for Other Services       Precautions / Restrictions Precautions Precautions: Fall Restrictions Weight Bearing Restrictions: No      Mobility Bed Mobility Overal bed mobility: Needs Assistance Bed Mobility: Supine to Sit;Sit to Supine     Supine to sit: Supervision;HOB elevated Sit to supine: Supervision      Transfers Overall transfer level: Needs assistance Equipment used: 1 person hand held assist Transfers: Sit to/from [employer] Sit to Stand: Min assist Stand pivot transfers: Min assist            Balance Overall balance assessment: Needs assistance Sitting-balance support: Feet supported Sitting balance-Leahy Scale: Good       Standing balance-Leahy Scale: Poor                              ADL Overall ADL's : Needs assistance/impaired Eating/Feeding: Independent;Bed level   Grooming: Wash/dry hands;Bed level;Set up   Upper Body Bathing: Minimal assitance;Sitting   Lower Body Bathing: Minimal assistance;Sit to/from stand   Upper Body Dressing : Set up;Sitting   Lower Body Dressing: Minimal assistance;Sit to/from stand Lower Body Dressing Details (indicate cue type and reason): able to donn and  doff socks Toilet Transfer: Minimal assistance;Stand-pivot;BSC   Toileting- Clothing Manipulation and Hygiene: Minimal assistance;Sit to/from stand               Vision     Perception     Praxis      Pertinent Vitals/Pain Pain Assessment: No/denies pain     Hand Dominance Right   Extremity/Trunk Assessment Upper Extremity Assessment Upper Extremity Assessment: Overall WFL for tasks assessed   Lower Extremity Assessment Lower Extremity Assessment: Defer to PT evaluation       Communication Communication Communication: HOH   Cognition Arousal/Alertness: Awake/alert Behavior During Therapy: WFL for tasks assessed/performed Overall Cognitive Status: Within Functional Limits for tasks assessed                     General Comments       Exercises       Shoulder Instructions      Home Living Family/patient expects to be discharged to:: Skilled nursing facility Living Arrangements: Children (daughter) Available Help at Discharge: Family;Available PRN/intermittently Type of Home: House Home Access: Stairs to enter Entrance Stairs-Number of Steps: 2   Home Layout: Multi-level;Bed/bath upstairs (split level) Alternate Level Stairs-Number of Steps: 6 Alternate Level Stairs-Rails: Right           Home Equipment: Walker - 4 wheels;Cane - single point;Shower seat;Bedside commode;Grab bars - toilet;Grab bars - tub/shower   Additional Comments: Does not have 24 hour care.      Prior Functioning/Environment Level of Independence:  Independent with assistive device(s);Needs assistance  Gait / Transfers Assistance Needed: Ambulates with cane inside of house.  Uses rollator outside of home. ADL's / Homemaking Assistance Needed: Assist with meals and housekeeping        OT Diagnosis: Generalized weakness   OT Problem List:     OT Treatment/Interventions:      OT Goals(Current goals can be found in the care plan section) Acute Rehab OT  Goals Patient Stated Goal: To get stronger  OT Frequency:     Barriers to D/C:            Co-evaluation              End of Session    Activity Tolerance: Patient tolerated treatment well Patient left: in bed;with family/visitor present;with call bell/phone within reach   Time: 1355-1419 OT Time Calculation (min): 24 min Charges:  OT General Charges $OT Visit: 1 Procedure OT Evaluation $Initial OT Evaluation Tier I: 1 Procedure OT Treatments $Self Care/Home Management : 8-22 mins G-Codes:    Evern BioMayberry, Kennieth Plotts Lynn 07/05/2014, 2:27 [id number]

## 2014-07-05 NOTE — Progress Notes (Signed)
PROGRESS NOTE  Lisa [name] ZOX:[id number] DOB: [birth year] DOA: 07/03/2014 PCP: [name], MD  HPI/Recap of past 24 hours: Reported feeling better, no n/v, no cough, no sob, no chest pain, still weak, but has improved, daughter at bedside Patient requesting soft food due to wearing denture. Denies dysphagia.  Assessment/Plan: Principal Problem:   CAP (community acquired pneumonia) Active Problems:   Asymptomatic carotid artery stenosis without infarction   Chronic venous insufficiency   Acute respiratory failure with hypoxia   Hypertension   Nausea with vomiting   Dizziness and giddiness   Dehydration   Tachycardia   Malnutrition of moderate degree  CAP: continue rocephin/zithromax, improving, adequate oral intake, d/c ivf.  N/v, resolved, not take good oral intake, d/c ivf  H/o htn: continue lopressor, hold norvasc/micardis   Code Status: full  Family Communication: patient and daughter  Disposition Plan: snf when medically stable   Consultants:  none  Procedures:  CTA  Antibiotics:  Rocephin/zithro   Objective: BP 126/43 mmHg  Pulse 89  Temp(Src) 98.6 F (37 C) (Oral)  Resp 18  Ht  (1.549 m)  Wt 56.7 kg (125 lb)  BMI 23.63 kg/m2  SpO2 94%  Intake/Output Summary (Last 24 hours) at 07/05/14 1816 Last data filed at 07/05/14 1359  Gross per 24 hour  Intake   2760 ml  Output    950 ml  Net   1810 ml   Filed Weights   07/04/14 0222 07/05/14 0530  Weight: 55.2 kg (121 lb 11.1 oz) 56.7 kg (125 lb)    Exam:   General:  NAD  Cardiovascular: RRR  Respiratory: CTABL  Abdomen: Soft/ND/NT, positive BS  Musculoskeletal: No Edema  Neuro: aox3  Data Reviewed: Basic Metabolic Panel:  Recent Labs Lab 07/03/14 1941 07/04/14 0146 07/05/14 0547  NA 136 134* 137  K 4.3 4.2 4.4  CL 104 103 105  CO2 GLUCOSE 163* 162* 100*  BUN CREATININE 0.81 0.70 0.69  CALCIUM 8.2* 8.3* 8.2*   Liver Function  Tests:  Recent Labs Lab 07/03/14 1941 07/04/14 0146  AST 23 21  ALT 24 24  ALKPHOS 59 54  BILITOT 0.4 0.3  PROT 5.4* 5.1*  ALBUMIN 3.4* 3.2*    Recent Labs Lab 07/04/14 0146  LIPASE 20   No results for input(s): AMMONIA in the last 168 hours. CBC:  Recent Labs Lab 07/03/14 1941 07/04/14 0146 07/05/14 0547  WBC 7.7 11.6* 7.2  NEUTROABS 7.1 10.4*  --   HGB 10.4* 10.3* 9.2*  HCT 30.7* 30.1* 27.4*  MCV 95.6 95.3 98.2  PLT 156 163 136*   Cardiac Enzymes:    Recent Labs Lab 07/03/14 1941 07/04/14 0146  TROPONINI <0.03 <0.03   BNP (last 3 results) No results for input(s): BNP in the last 8760 hours.  ProBNP (last 3 results) No results for input(s): PROBNP in the last 8760 hours.  CBG: No results for input(s): GLUCAP in the last 168 hours.  Recent Results (from the past 240 hour(s))  Blood culture (routine x 2)     Status: None (Preliminary result)   Collection Time: 07/03/14 10:39 PM  Result Value Ref Range Status   Specimen Description BLOOD RIGHT ARM  Final   Special Requests BOTTLES DRAWN AEROBIC AND ANAEROBIC 5CC  Final   Culture   Final           BLOOD CULTURE RECEIVED NO GROWTH TO DATE CULTURE WILL BE HELD FOR 5 DAYS  BEFORE ISSUING A FINAL NEGATIVE REPORT Performed at [facility]    Report Status PENDING  Incomplete  Blood culture (routine x 2)     Status: None (Preliminary result)   Collection Time: 07/03/14 10:43 PM  Result Value Ref Range Status   Specimen Description BLOOD RIGHT HAND  Final   Special Requests BOTTLES DRAWN AEROBIC AND ANAEROBIC 5CC  Final   Culture   Final           BLOOD CULTURE RECEIVED NO GROWTH TO DATE CULTURE WILL BE HELD FOR 5 DAYS BEFORE ISSUING A FINAL NEGATIVE REPORT Performed at [facility]    Report Status PENDING  Incomplete     Studies: Ct Angio Chest Pe W/cm &/or Wo Cm  07/04/2014   CLINICAL DATA:  Acute onset of nausea, vomiting, dizziness and lightheadedness. Initial encounter.  EXAM: CT  ANGIOGRAPHY CHEST WITH CONTRAST  TECHNIQUE: Multidetector CT imaging of the chest was performed using the standard protocol during bolus administration of intravenous contrast. Multiplanar CT image reconstructions and MIPs were obtained to evaluate the vascular anatomy.  CONTRAST:  80mL OMNIPAQUE IOHEXOL 350 MG/ML SOLN  COMPARISON:  Chest radiograph performed 07/03/2014  FINDINGS: There is no evidence of pulmonary embolus.  Dependent bilateral lower lobe airspace opacification may reflect atelectasis or pneumonia. Scattered peripheral nodular densities likely reflect atelectasis. Scattered bilateral calcified granulomata are seen. There is no evidence of pleural effusion or pneumothorax. No masses are identified; no abnormal focal contrast enhancement is seen.  Calcified nodes are noted at the subcarinal region, and about the distal esophagus. No mediastinal lymphadenopathy is seen. No pericardial effusion is identified. Scattered calcification is noted along the aortic arch. The great vessels are grossly unremarkable in appearance. No axillary lymphadenopathy is seen. The visualized portions of the thyroid gland are unremarkable in appearance.  The visualized portions of the liver and spleen are unremarkable. The visualized portions of the pancreas, gallbladder, stomach, adrenal glands and kidneys are within normal limits. Scattered diverticulosis is noted at the splenic flexure of the colon.  No acute osseous abnormalities are seen. There is mild chronic anterior fusion of multiple thoracic vertebral bodies.  Review of the MIP images confirms the above findings.  IMPRESSION: 1. No evidence of pulmonary embolus. 2. Dependent bilateral lower lobe airspace opacification may reflect atelectasis or pneumonia. 3. Calcified nodes at the subcarinal region, and about the distal esophagus, likely reflecting remote granulomatous disease. No mediastinal lymphadenopathy seen. 4. Scattered diverticulosis at the splenic flexure  of the colon. 5. Mild chronic anterior fusion of multiple thoracic vertebral bodies.   Electronically Signed   By: Roanna RaiderJeffery  Chang M.D.   On: 07/04/2014 00:25   Dg Abd 2 Views  07/04/2014   CLINICAL DATA:  81 year old female with abdominal pain, nausea and vomiting  EXAM: ABDOMEN - 2 VIEW  COMPARISON:  Chest x-ray and CT PE study performed earlier today  FINDINGS: Lateral decubitus radiograph demonstrates no evidence of free air. The bones are diffusely osteopenic. Contrast material present within the bladder. No evidence of bowel obstruction. Partial opacification of the bilateral renal collecting systems. Multilevel degenerative disc disease and dextro convex scoliosis.  IMPRESSION: 1. No evidence of obstruction or free air.   Electronically Signed   By: Malachy MoanHeath  McCullough M.D.   On: 07/04/2014 02:08    Scheduled Meds: . aspirin EC  81 mg Oral Daily  . azithromycin  500 mg Intravenous Q24H  . cefTRIAXone (ROCEPHIN)  IV  1 g Intravenous Q24H  . enoxaparin (  LOVENOX) injection  40 mg Subcutaneous Q24H  . ezetimibe-simvastatin  1 tablet Oral QHS  . feeding supplement (RESOURCE BREEZE)  1 Container Oral TID BM  . metoprolol tartrate  12.5 mg Oral BID  . raloxifene  60 mg Oral QHS  . senna-docusate  2 tablet Oral BID  . timolol  1 drop Both Eyes Daily    Continuous Infusions:    Time spent:  Quaron Delacruz MD, PhD  [facility] Pager [phone]. If 7PM-7AM, please contact night-coverage at [email or web address], password [facility] 07/05/2014, 6:16 PM  LOS: 1 day

## 2014-07-05 NOTE — Progress Notes (Signed)
CSW (Clinical Child psychotherapistocial Worker) full assessment to follow. CSW working on SNF placement.   Lisa [name], LCSWA [phone]

## 2014-07-06 DIAGNOSIS — E440 Moderate protein-calorie malnutrition: Secondary | ICD-10-CM

## 2014-07-06 LAB — CBC
HCT: 25.7 % — ABNORMAL LOW (ref 36.0–46.0)
Hemoglobin: 8.8 g/dL — ABNORMAL LOW (ref 12.0–15.0)
MCH: 33.1 pg (ref 26.0–34.0)
MCHC: 34.2 g/dL (ref 30.0–36.0)
MCV: 96.6 fL (ref 78.0–100.0)
Platelets: 168 10*3/uL (ref 150–400)
RBC: 2.66 MIL/uL — ABNORMAL LOW (ref 3.87–5.11)
RDW: 13.1 % (ref 11.5–15.5)
WBC: 5.6 10*3/uL (ref 4.0–10.5)

## 2014-07-06 LAB — BASIC METABOLIC PANEL
Anion gap: 7 (ref 5–15)
BUN: 13 mg/dL (ref 6–23)
CO2: 25 mmol/L (ref 19–32)
Calcium: 8.2 mg/dL — ABNORMAL LOW (ref 8.4–10.5)
Chloride: 101 mmol/L (ref 96–112)
Creatinine, Ser: 0.78 mg/dL (ref 0.50–1.10)
GFR calc Af Amer: 84 mL/min — ABNORMAL LOW (ref >90)
GFR calc non Af Amer: 73 mL/min — ABNORMAL LOW (ref >90)
Glucose, Bld: 112 mg/dL — ABNORMAL HIGH (ref 70–99)
Potassium: 4.3 mmol/L (ref 3.5–5.1)
Sodium: 133 mmol/L — ABNORMAL LOW (ref 135–145)

## 2014-07-06 LAB — VITAMIN B12: Vitamin B-12: 745 pg/mL (ref 211–911)

## 2014-07-06 LAB — IRON AND TIBC
Iron: 32 ug/dL — ABNORMAL LOW (ref 42–145)
Saturation Ratios: 16 % — ABNORMAL LOW (ref 20–55)
TIBC: 195 ug/dL — ABNORMAL LOW (ref 250–470)
UIBC: 163 ug/dL (ref 125–400)

## 2014-07-06 LAB — FERRITIN: Ferritin: 82 ng/mL (ref 10–291)

## 2014-07-06 LAB — TSH: TSH: 3.12 u[IU]/mL (ref 0.350–4.500)

## 2014-07-06 LAB — OCCULT BLOOD X 1 CARD TO LAB, STOOL: Fecal Occult Bld: NEGATIVE

## 2014-07-06 MED ORDER — ACETAMINOPHEN 325 MG PO TABS
650.0000 mg | ORAL_TABLET | Freq: Four times a day (QID) | ORAL | Status: DC | PRN
  Administered 2014-07-06: 650 mg via ORAL
  Filled 2014-07-06: qty 2

## 2014-07-06 NOTE — Progress Notes (Signed)
PROGRESS NOTE  Lisa [name] ZHY:[id number] DOB: [birth year] DOA: 07/03/2014 PCP: Gaye AlkenBARNES,[name], MD  HPI/Recap of past 24 hours:  tmax 100.5, still weak, no n/v, no cough, no sob, no chest pain,  daughter at bedside  C/o [id number], Reported recent EGD a few days ago wnl  Assessment/Plan: Principal Problem:   CAP (community acquired pneumonia) Active Problems:   Asymptomatic carotid artery stenosis without infarction   Chronic venous insufficiency   Acute respiratory failure with hypoxia   Hypertension   Nausea with vomiting   Dizziness and giddiness   Dehydration   Tachycardia   Malnutrition of moderate degree  CAP: continue rocephin/zithromax, improving, adequate oral intake, d/c ivf.  N/v, resolved,  take good oral intake, d/c ivf  H/o htn: continue lopressor, hold norvasc/micardis  Anemia: work up pending, no indication for transfusion.   Code Status: full  Family Communication: patient and daughter  Disposition Plan: snf when medically stable   Consultants:  none  Procedures:  CTA  Antibiotics:  Rocephin/zithro   Objective: BP 121/37 mmHg  Pulse 73  Temp(Src) 98.2 F (36.8 C) (Oral)  Resp 20  Ht 5\' 1"  (1.549 m)  Wt 57.834 kg (127 lb 8 oz)  BMI 24.10 kg/m2  SpO2 94%  Intake/Output Summary (Last 24 hours) at 07/06/14 1718 Last data filed at 07/06/14 1600  Gross per 24 hour  Intake   1242 ml  Output   1901 ml  Net   -659 ml   Filed Weights   07/04/14 0222 07/05/14 0530 07/06/14 0445  Weight: 55.2 kg (121 lb 11.1 oz) 56.7 kg (125 lb) 57.834 kg (127 lb 8 oz)    Exam:   General:  NAD  Cardiovascular: RRR  Respiratory: CTABL  Abdomen: Soft/ND/NT, positive BS  Musculoskeletal: No Edema  Neuro: aaox3  Data Reviewed: Basic Metabolic Panel:  Recent Labs Lab 07/03/14 1941 07/04/14 0146 07/05/14 0547 07/06/14 0525  NA 136 134* 137 133*  K 4.3 4.2 4.4 4.3  CL 104 103 105 101  CO2 25 25 25 25   GLUCOSE  163* 162* 100* 112*  BUN 23 21 13 13   CREATININE 0.81 0.70 0.69 0.78  CALCIUM 8.2* 8.3* 8.2* 8.2*   Liver Function Tests:  Recent Labs Lab 07/03/14 1941 07/04/14 0146  AST 23 21  ALT 24 24  ALKPHOS 59 54  BILITOT 0.4 0.3  PROT 5.4* 5.1*  ALBUMIN 3.4* 3.2*    Recent Labs Lab 07/04/14 0146  LIPASE 20   No results for input(s): AMMONIA in the last 168 hours. CBC:  Recent Labs Lab 07/03/14 1941 07/04/14 0146 07/05/14 0547 07/06/14 0525  WBC 7.7 11.6* 7.2 5.6  NEUTROABS 7.1 10.4*  --   --   HGB 10.4* 10.3* 9.2* 8.8*  HCT 30.7* 30.1* 27.4* 25.7*  MCV 95.6 95.3 98.2 96.6  PLT 156 163 136* 168   Cardiac Enzymes:    Recent Labs Lab 07/03/14 1941 07/04/14 0146  TROPONINI <0.03 <0.03   BNP (last 3 results) No results for input(s): BNP in the last 8760 hours.  ProBNP (last 3 results) No results for input(s): PROBNP in the last 8760 hours.  CBG: No results for input(s): GLUCAP in the last 168 hours.  Recent Results (from the past 240 hour(s))  Blood culture (routine x 2)     Status: None (Preliminary result)   Collection Time: 07/03/14 10:39 PM  Result Value Ref Range Status   Specimen Description BLOOD RIGHT ARM  Final   Special Requests BOTTLES  DRAWN AEROBIC AND ANAEROBIC 5CC  Final   Culture   Final           BLOOD CULTURE RECEIVED NO GROWTH TO DATE CULTURE WILL BE HELD FOR 5 DAYS BEFORE ISSUING A FINAL NEGATIVE REPORT Performed at [facility]    Report Status PENDING  Incomplete  Blood culture (routine x 2)     Status: None (Preliminary result)   Collection Time: 07/03/14 10:43 PM  Result Value Ref Range Status   Specimen Description BLOOD RIGHT HAND  Final   Special Requests BOTTLES DRAWN AEROBIC AND ANAEROBIC 5CC  Final   Culture   Final           BLOOD CULTURE RECEIVED NO GROWTH TO DATE CULTURE WILL BE HELD FOR 5 DAYS BEFORE ISSUING A FINAL NEGATIVE REPORT Performed at [facility]    Report Status PENDING  Incomplete      Studies: No results found.  Scheduled Meds: . aspirin EC  81 mg Oral Daily  . azithromycin  500 mg Intravenous Q24H  . cefTRIAXone (ROCEPHIN)  IV  1 g Intravenous Q24H  . enoxaparin (LOVENOX) injection  40 mg Subcutaneous Q24H  . ezetimibe-simvastatin  1 tablet Oral QHS  . feeding supplement (RESOURCE BREEZE)  1 Container Oral TID BM  . metoprolol tartrate  12.5 mg Oral BID  . raloxifene  60 mg Oral QHS  . senna-docusate  2 tablet Oral BID  . timolol  1 drop Both Eyes Daily    Continuous Infusions:    Time spent:  Bob Daversa MD, PhD  [facility] Pager [phone]. If 7PM-7AM, please contact night-coverage at [email or web address], password [facility] 07/06/2014, 5:18 PM  LOS: 2 days

## 2014-07-06 NOTE — Progress Notes (Signed)
CSW (Clinical Child psychotherapistocial Worker) notified by MD pt with fever last night so will not dc today. CSW notified MD pt can dc tomorrow if medically stable. Facility confirmed they can accept pt but still needing pt daughter to do paperwork. Pt daughter aware that if pt is ready for dc she will need to meet with facility at facility requested time to complete paperwork.  Lj Miyamoto Lajean Savermbelal, LCSWA Weekend CSW [phone]

## 2014-07-06 NOTE — Progress Notes (Signed)
Patient has low fever at 100.5 this AM with vital check. Paged Claiborne Billingsallahan (on-call) as there is no PRN tylenol.

## 2014-07-07 DIAGNOSIS — D509 Iron deficiency anemia, unspecified: Secondary | ICD-10-CM

## 2014-07-07 LAB — BASIC METABOLIC PANEL
Anion gap: 6 (ref 5–15)
BUN: 10 mg/dL (ref 6–20)
CO2: 28 mmol/L (ref 22–32)
Calcium: 8.5 mg/dL — ABNORMAL LOW (ref 8.9–10.3)
Chloride: 102 mmol/L (ref 101–111)
Creatinine, Ser: 0.69 mg/dL (ref 0.44–1.00)
GFR calc Af Amer: >60 mL/min (ref >60)
GFR calc non Af Amer: >60 mL/min (ref >60)
Glucose, Bld: 109 mg/dL — ABNORMAL HIGH (ref 70–99)
Potassium: 3.8 mmol/L (ref 3.5–5.1)
Sodium: 136 mmol/L (ref 135–145)

## 2014-07-07 LAB — CBC
HCT: 28.1 % — ABNORMAL LOW (ref 36.0–46.0)
Hemoglobin: 9.5 g/dL — ABNORMAL LOW (ref 12.0–15.0)
MCH: 32.4 pg (ref 26.0–34.0)
MCHC: 33.8 g/dL (ref 30.0–36.0)
MCV: 95.9 fL (ref 78.0–100.0)
Platelets: 138 10*3/uL — ABNORMAL LOW (ref 150–400)
RBC: 2.93 MIL/uL — ABNORMAL LOW (ref 3.87–5.11)
RDW: 12.8 % (ref 11.5–15.5)
WBC: 5.9 10*3/uL (ref 4.0–10.5)

## 2014-07-07 MED ORDER — FERROUS SULFATE 325 (65 FE) MG PO TABS
325.0000 mg | ORAL_TABLET | Freq: Two times a day (BID) | ORAL | Status: DC
  Filled 2014-07-07 (×2): qty 1

## 2014-07-07 MED ORDER — SENNOSIDES-DOCUSATE SODIUM 8.6-50 MG PO TABS: 2.0000 | ORAL_TABLET | Freq: Every day | ORAL | Status: DC

## 2014-07-07 MED ORDER — METOPROLOL TARTRATE 25 MG PO TABS: 12.5000 mg | ORAL_TABLET | Freq: Two times a day (BID) | ORAL | Status: DC

## 2014-07-07 MED ORDER — FERROUS SULFATE 325 (65 FE) MG PO TABS: 325.0000 mg | ORAL_TABLET | Freq: Every day | ORAL | Status: DC

## 2014-07-07 MED ORDER — DOXYCYCLINE HYCLATE 100 MG PO CAPS: 100.0000 mg | ORAL_CAPSULE | Freq: Two times a day (BID) | ORAL | Status: DC

## 2014-07-07 MED ORDER — BOOST / RESOURCE BREEZE PO LIQD
1.0000 | Freq: Three times a day (TID) | ORAL | Status: DC
Start: 2014-07-07 — End: 2015-03-13

## 2014-07-07 NOTE — Progress Notes (Signed)
  [facility] to [facility]. Pt to be taken by daughter

## 2014-07-07 NOTE — Clinical Social Work Note (Signed)
Clinical Social Worker facilitated patient discharge including contacting patient family and facility to confirm patient discharge plans.  Clinical information faxed to facility and family agreeable with plan.  CSW arranged transport via family care.  RN to call report prior to discharge.  Clinical Social Worker will sign off for now as social work intervention is no longer needed. Please consult us again if new need arises.  Macario GoldsJesse Corena Tilson, [place] [phone]

## 2014-07-07 NOTE — Discharge Summary (Signed)
Discharge Summary  Lisa [name] ZOX:[id number] DOB: 1923-06-20  PCP: [name], MD  Admit date: 07/03/2014 Discharge date: 07/07/2014  Time spent: >3mins  Recommendations for Outpatient Follow-up:  1. F/u with PMD within a week, pmd to further adjust blood pressure meds  Discharge Diagnoses:  Active Hospital Problems   Diagnosis Date Noted  . CAP (community acquired pneumonia) 07/04/2014  . Malnutrition of moderate degree 07/05/2014  . Acute respiratory failure with hypoxia 07/04/2014  . Nausea with vomiting 07/04/2014  . Dizziness and giddiness 07/04/2014  . Dehydration 07/04/2014  . Tachycardia 07/04/2014  . Hypertension   . Asymptomatic carotid artery stenosis without infarction 12/20/2013  . Chronic venous insufficiency 12/20/2013    Resolved Hospital Problems   Diagnosis Date Noted Date Resolved  No resolved problems to display.    Discharge Condition: stable  Diet recommendation: heart healthy  Filed Weights   07/05/14 0530 07/06/14 0445 07/07/14 0545  Weight: 56.7 kg (125 lb) 57.834 kg (127 lb 8 oz) 56.836 kg (125 lb 4.8 oz)    History of present illness:  Lisa [name] is a 81 y.o. female with Past medical history of hypertension, dyslipidemia. The patient is presenting with complaints of fatigue as well as nausea vomiting and shortness of breath. Patient was at her baseline earlier in the morning, she lives with her daughter, history was obtained on patient as well as daughter. After the lunch the patient has taken her blood pressure medications which she generally takes it on 11 AM. After that she slept and when she woke up she felt dizzy and lightheaded. She also for warm. She also had some generalized fatigue and weakness and therefore they called EMS as her symptoms were not resolving. When EMS found her she was hypoxic. She also had an episode of vomiting 2 with the EMS and received Zofran. At the time of my evaluation she continues to  have complains of nausea and vomiting as well as mild epigastric discomfort. She denies any chest pain chest heaviness chest tightness. No recent hospitalization or immobilization. She denies any focal deficit. She has chronic carpal tunnel on the right arm. She denies any diarrhea or constipation does not have any active bleeding does not have the burning urination.  The patient is coming from home. And at her baseline independent for most of her ADL.   Hospital Course:  Principal Problem:   CAP (community acquired pneumonia) Active Problems:   Asymptomatic carotid artery stenosis without infarction   Chronic venous insufficiency   Acute respiratory failure with hypoxia   Hypertension   Nausea with vomiting   Dizziness and giddiness   Dehydration   Tachycardia   Malnutrition of moderate degree  CAP: urine legionella negative, urine strep pneumo negative, CTA no PE, + bilateral lower lobe pna, received rocephin/zithromax, improving, adequate oral intake, d/c ivf. Lung clear, on room air, no increased WOB, mild fever 100.5, subsided, discharge to snf with doxycycline for total of 10days.  N/v, resolved, take good oral intake, d/c ivf  H/o htn: started lopressor, home meds norvasc/micardis discontinued. PMD to continue adjust bp meds.  Anemia: FOBT negative, b12 wnl, folate pending, + Iron deficiency, started iron supplement with stool softener.   Code Status: full  Family Communication: patient and daughter  Disposition Plan: snf    Consultants:  none  Procedures:  CTA  Antibiotics:  Rocephin/zithro  Discharge Exam: BP 137/57 mmHg  Pulse 91  Temp(Src) 99.3 F (37.4 C) (Oral)  Resp 20  Ht 5'  1" (1.549 m)  Wt 56.836 kg (125 lb 4.8 oz)  BMI 23.69 kg/m2  SpO2 93%   General: NAD  Cardiovascular: RRR  Respiratory: CTABL  Abdomen: Soft/ND/NT, positive BS  Musculoskeletal: No Edema  Neuro: aaox3, hard of hearing   Discharge Instructions You were  cared for by a hospitalist during your hospital stay. If you have any questions about your discharge medications or the care you received while you were in the hospital after you are discharged, you can call the unit and asked to speak with the hospitalist on call if the hospitalist that took care of you is not available. Once you are discharged, your primary care physician will handle any further medical issues. Please note that NO REFILLS for any discharge medications will be authorized once you are discharged, as it is imperative that you return to your primary care physician (or establish a relationship with a primary care physician if you do not have one) for your aftercare needs so that they can reassess your need for medications and monitor your lab values.  Discharge Instructions    Diet - low sodium heart healthy    Complete by:  As directed      Increase activity slowly    Complete by:  As directed             Medication List    STOP taking these medications        amLODipine 2.5 MG tablet  Commonly known as:  NORVASC     telmisartan 20 MG tablet  Commonly known as:  MICARDIS      TAKE these medications        aspirin EC 81 MG tablet  Take 81 mg by mouth daily.     calcium carbonate 600 MG Tabs tablet  Commonly known as:  OS-CAL  Take 600 mg by mouth 2 (two) times daily with a meal.     doxycycline 100 MG capsule  Commonly known as:  VIBRAMYCIN  Take 1 capsule (100 mg total) by mouth 2 (two) times daily.     ezetimibe-simvastatin 10-40 MG per tablet  Commonly known as:  VYTORIN  Take 1 tablet by mouth at bedtime.     feeding supplement (RESOURCE BREEZE) Liqd  Take 1 Container by mouth 3 (three) times daily between meals.     ferrous sulfate 325 (65 FE) MG tablet  Take 1 tablet (325 mg total) by mouth daily with breakfast.     metoprolol tartrate 25 MG tablet  Commonly known as:  LOPRESSOR  Take 0.5 tablets (12.5 mg total) by mouth 2 (two) times daily.      multivitamin tablet  Take 1 tablet by mouth daily.     raloxifene 60 MG tablet  Commonly known as:  EVISTA  Take 60 mg by mouth at bedtime.     senna-docusate 8.6-50 MG per tablet  Commonly known as:  Senokot-S  Take 2 tablets by mouth at bedtime.     timolol 0.25 % ophthalmic solution  Commonly known as:  BETIMOL  Place 1-2 drops into both eyes daily.     VITAMIN D PO  Take 1 tablet by mouth daily.       Allergies  Allergen Reactions  . Mycostatin [Nystatin] Swelling and Rash       Follow-up Information    Follow up with Gaye AlkenBARNES,[name], MD In 1 week.   Specialty:  Family Medicine   Why:  hospital follow up, pmd to further adjust blood  pressure meds.   Contact information:   [address] [place] [address] [phone]        The results of significant diagnostics from this hospitalization (including imaging, microbiology, ancillary and laboratory) are listed below for reference.    Significant Diagnostic Studies: Ct Angio Chest Pe W/cm &/or Wo Cm  07/04/2014   CLINICAL DATA:  Acute onset of nausea, vomiting, dizziness and lightheadedness. Initial encounter.  EXAM: CT ANGIOGRAPHY CHEST WITH CONTRAST  TECHNIQUE: Multidetector CT imaging of the chest was performed using the standard protocol during bolus administration of intravenous contrast. Multiplanar CT image reconstructions and MIPs were obtained to evaluate the vascular anatomy.  CONTRAST:  80mL OMNIPAQUE IOHEXOL 350 MG/ML SOLN  COMPARISON:  Chest radiograph performed 07/03/2014  FINDINGS: There is no evidence of pulmonary embolus.  Dependent bilateral lower lobe airspace opacification may reflect atelectasis or pneumonia. Scattered peripheral nodular densities likely reflect atelectasis. Scattered bilateral calcified granulomata are seen. There is no evidence of pleural effusion or pneumothorax. No masses are identified; no abnormal focal contrast enhancement is seen.  Calcified nodes are noted at  the subcarinal region, and about the distal esophagus. No mediastinal lymphadenopathy is seen. No pericardial effusion is identified. Scattered calcification is noted along the aortic arch. The great vessels are grossly unremarkable in appearance. No axillary lymphadenopathy is seen. The visualized portions of the thyroid gland are unremarkable in appearance.  The visualized portions of the liver and spleen are unremarkable. The visualized portions of the pancreas, gallbladder, stomach, adrenal glands and kidneys are within normal limits. Scattered diverticulosis is noted at the splenic flexure of the colon.  No acute osseous abnormalities are seen. There is mild chronic anterior fusion of multiple thoracic vertebral bodies.  Review of the MIP images confirms the above findings.  IMPRESSION: 1. No evidence of pulmonary embolus. 2. Dependent bilateral lower lobe airspace opacification may reflect atelectasis or pneumonia. 3. Calcified nodes at the subcarinal region, and about the distal esophagus, likely reflecting remote granulomatous disease. No mediastinal lymphadenopathy seen. 4. Scattered diverticulosis at the splenic flexure of the colon. 5. Mild chronic anterior fusion of multiple thoracic vertebral bodies.   Electronically Signed   By: Roanna Raider M.D.   On: 07/04/2014 00:25   Dg Chest Port 1 View  07/03/2014   CLINICAL DATA:  Syncope and shortness of breath for 1 day.  EXAM: PORTABLE CHEST - 1 VIEW  COMPARISON:  None.  FINDINGS: Mild enlargement of the cardiopericardial silhouette. No mediastinal or hilar masses or convincing adenopathy.  There are prominent markings in the lungs. Mild opacity at the right lung base partly silhouettes the hemidiaphragm. This may be atelectasis. A small area of pneumonia is possible. No other evidence of pneumonia and no pulmonary edema.  Bony thorax is demineralized but grossly intact.  IMPRESSION: Mild opacity at the right lung base. Pneumonia is possible but this is  more likely scarring or atelectasis.  No other evidence of acute cardiopulmonary disease. Mild cardiomegaly.   Electronically Signed   By: Amie Portland M.D.   On: 07/03/2014 19:33   Dg Abd 2 Views  07/04/2014   CLINICAL DATA:  81 year old female with abdominal pain, nausea and vomiting  EXAM: ABDOMEN - 2 VIEW  COMPARISON:  Chest x-ray and CT PE study performed earlier today  FINDINGS: Lateral decubitus radiograph demonstrates no evidence of free air. The bones are diffusely osteopenic. Contrast material present within the bladder. No evidence of bowel obstruction. Partial opacification of the bilateral renal collecting systems.  Multilevel degenerative disc disease and dextro convex scoliosis.  IMPRESSION: 1. No evidence of obstruction or free air.   Electronically Signed   By: Malachy Moan M.D.   On: 07/04/2014 02:08    Microbiology: Recent Results (from the past 240 hour(s))  Blood culture (routine x 2)     Status: None (Preliminary result)   Collection Time: 07/03/14 10:39 PM  Result Value Ref Range Status   Specimen Description BLOOD RIGHT ARM  Final   Special Requests BOTTLES DRAWN AEROBIC AND ANAEROBIC 5CC  Final   Culture   Final           BLOOD CULTURE RECEIVED NO GROWTH TO DATE CULTURE WILL BE HELD FOR 5 DAYS BEFORE ISSUING A FINAL NEGATIVE REPORT Performed at [facility]    Report Status PENDING  Incomplete  Blood culture (routine x 2)     Status: None (Preliminary result)   Collection Time: 07/03/14 10:43 PM  Result Value Ref Range Status   Specimen Description BLOOD RIGHT HAND  Final   Special Requests BOTTLES DRAWN AEROBIC AND ANAEROBIC 5CC  Final   Culture   Final           BLOOD CULTURE RECEIVED NO GROWTH TO DATE CULTURE WILL BE HELD FOR 5 DAYS BEFORE ISSUING A FINAL NEGATIVE REPORT Performed at [facility]    Report Status PENDING  Incomplete     Labs: Basic Metabolic Panel:  Recent Labs Lab 07/03/14 1941 07/04/14 0146 07/05/14 0547  07/06/14 0525 07/07/14 0643  NA 136 134* 137 133* 136  K 4.3 4.2 4.4 4.3 3.8  CL 104 103 105 101 102  CO2 GLUCOSE 163* 162* 100* 112* 109*  BUN CREATININE 0.81 0.70 0.69 0.78 0.69  CALCIUM 8.2* 8.3* 8.2* 8.2* 8.5*   Liver Function Tests:  Recent Labs Lab 07/03/14 1941 07/04/14 0146  AST 23 21  ALT 24 24  ALKPHOS 59 54  BILITOT 0.4 0.3  PROT 5.4* 5.1*  ALBUMIN 3.4* 3.2*    Recent Labs Lab 07/04/14 0146  LIPASE 20   No results for input(s): AMMONIA in the last 168 hours. CBC:  Recent Labs Lab 07/03/14 1941 07/04/14 0146 07/05/14 0547 07/06/14 0525 07/07/14 0643  WBC 7.7 11.6* 7.2 5.6 5.9  NEUTROABS 7.1 10.4*  --   --   --   HGB 10.4* 10.3* 9.2* 8.8* 9.5*  HCT 30.7* 30.1* 27.4* 25.7* 28.1*  MCV 95.6 95.3 98.2 96.6 95.9  PLT 156 163 136* 168 138*   Cardiac Enzymes:  Recent Labs Lab 07/03/14 1941 07/04/14 0146  TROPONINI <0.03 <0.03   BNP: BNP (last 3 results) No results for input(s): BNP in the last 8760 hours.  ProBNP (last 3 results) No results for input(s): PROBNP in the last 8760 hours.  CBG: No results for input(s): GLUCAP in the last 168 hours.     SignedAlbertine Grates MD, PhD  [facility] 07/07/2014, 9:24 AM

## 2014-07-07 NOTE — Clinical Social Work Placement (Signed)
   CLINICAL SOCIAL WORK PLACEMENT  NOTE  Date:  07/07/2014  Patient Details  Name: Lisa [name] MRN: [id number] Date of Birth: [birth year]  Clinical Social Work is seeking post-discharge placement for this patient at the   level of care (*CSW will initial, date and re-position this form in  chart as items are completed):  Yes   Patient/family provided with [facility] Clinical Social Work Department's list of facilities offering this level of care within the geographic area requested by the patient (or if unable, by the patient's family).  Yes   Patient/family informed of their freedom to choose among providers that offer the needed level of care, that participate in Medicare, Medicaid or managed care program needed by the patient, have an available bed and are willing to accept the patient.  Yes   Patient/family informed of [facility] ownership interest in [facility] and [facility], as well as of the fact that they are under no obligation to receive care at these facilities.  PASRR submitted to EDS on 07/05/14     PASRR number received on 07/05/14     Existing PASRR number confirmed on       FL2 transmitted to all facilities in geographic area requested by pt/family on 07/05/14     FL2 transmitted to all facilities within larger geographic area on       Patient informed that his/her managed care company has contracts with or will negotiate with certain facilities, including the following:        Yes   Patient/family informed of bed offers received.  [facility]     Physician recommends and patient chooses bed at      [facility].  [facility]     Patient family notified on 07/07/14 of transfer.  Name of family member notified:  Emmit AlexandersBarbara Forsythe ([phone])     PHYSICIAN Please prepare priority discharge summary, including medications, Please sign FL2,  Please prepare prescriptions     Additional Comment:    Macario GoldsJesse Hai Grabe, LCSW [phone]

## 2014-07-08 ENCOUNTER — Non-Acute Institutional Stay (SKILLED_NURSING_FACILITY): Payer: Medicare Other | Admitting: Adult Health

## 2014-07-08 DIAGNOSIS — K5900 Constipation, unspecified: Secondary | ICD-10-CM | POA: Diagnosis not present

## 2014-07-08 DIAGNOSIS — J189 Pneumonia, unspecified organism: Secondary | ICD-10-CM | POA: Diagnosis not present

## 2014-07-08 DIAGNOSIS — D509 Iron deficiency anemia, unspecified: Secondary | ICD-10-CM | POA: Diagnosis not present

## 2014-07-08 DIAGNOSIS — R112 Nausea with vomiting, unspecified: Secondary | ICD-10-CM | POA: Diagnosis not present

## 2014-07-08 DIAGNOSIS — M810 Age-related osteoporosis without current pathological fracture: Secondary | ICD-10-CM

## 2014-07-08 DIAGNOSIS — R5381 Other malaise: Secondary | ICD-10-CM | POA: Diagnosis not present

## 2014-07-08 DIAGNOSIS — J029 Acute pharyngitis, unspecified: Secondary | ICD-10-CM | POA: Diagnosis not present

## 2014-07-08 DIAGNOSIS — I10 Essential (primary) hypertension: Secondary | ICD-10-CM | POA: Diagnosis not present

## 2014-07-08 LAB — FOLATE RBC
Folate, Hemolysate: 393.4 ng/mL
Folate, RBC: 1415 ng/mL (ref >498)
Hematocrit: 27.8 % — ABNORMAL LOW (ref 34.0–46.6)

## 2014-07-09 ENCOUNTER — Non-Acute Institutional Stay (SKILLED_NURSING_FACILITY): Payer: Medicare Other | Admitting: Internal Medicine

## 2014-07-09 DIAGNOSIS — E46 Unspecified protein-calorie malnutrition: Secondary | ICD-10-CM | POA: Diagnosis not present

## 2014-07-09 DIAGNOSIS — K5901 Slow transit constipation: Secondary | ICD-10-CM

## 2014-07-09 DIAGNOSIS — D509 Iron deficiency anemia, unspecified: Secondary | ICD-10-CM

## 2014-07-09 DIAGNOSIS — R682 Dry mouth, unspecified: Secondary | ICD-10-CM | POA: Diagnosis not present

## 2014-07-09 DIAGNOSIS — M810 Age-related osteoporosis without current pathological fracture: Secondary | ICD-10-CM

## 2014-07-09 DIAGNOSIS — R5381 Other malaise: Secondary | ICD-10-CM | POA: Diagnosis not present

## 2014-07-09 DIAGNOSIS — I959 Hypotension, unspecified: Secondary | ICD-10-CM | POA: Diagnosis not present

## 2014-07-09 DIAGNOSIS — J189 Pneumonia, unspecified organism: Secondary | ICD-10-CM | POA: Diagnosis not present

## 2014-07-09 NOTE — Progress Notes (Signed)
Patient ID: Lisa [name], female   DOB: 1923-03-27, 81 y.o.   MRN: [id number]     [facility]   PCP: [name], MD  Code Status: full code  Allergies  Allergen Reactions  . Mycostatin [Nystatin] Swelling and Rash    Chief Complaint  Patient presents with  . New Admit To SNF     HPI:  81 year old patient is here for short term rehabilitation post hospital admission from 07/03/14-07/07/14 with dyspnea. She was diagnosed with community acquired pneumonia. She was started on rocephin and azithromycin with iv fluids. She was started on iron supplement for iron deficiency anemia. Her bp medications were held for low blood pressure reading. She is seen in her room today. Her daughter is present at bedside. She complaints of dry mouth. Her energy level is slowly improving. Her appetite is picking up. She complaints of scratchy throat and is using chloraseptic spray. This has been helping. She has past medical history of hypertension, dyslipidemia, iron deficiency anemia.   Review of Systems:  Constitutional: Negative for fever, chills, diaphoresis. positive for malaise. HENT: Negative for headache, congestion, nasal discharge Eyes: Negative for eye pain, blurred vision, double vision and discharge.  Respiratory: Negative for cough, shortness of breath and wheezing.   Cardiovascular: Negative for chest pain, palpitations, leg swelling.  Gastrointestinal: Negative for heartburn, nausea, vomiting, abdominal pain. Had a bowel movement this am Genitourinary: Negative for dysuria Musculoskeletal: Negative for back pain, falls Skin: Negative for itching, rash.  Neurological: positive for weakness. Negative for dizziness, tingling, focal weakness Psychiatric/Behavioral: Negative for depression    Past Medical History  Diagnosis Date  . Arthritis   . Cataract   . Glaucoma   . Hypertension   . Osteoporosis    Past Surgical History    Procedure Laterality Date  . Appendectomy    . Eye surgery    . Abdominal hysterectomy     Social History:   reports that she has never smoked. She has never used smokeless tobacco. She reports that she does not drink alcohol or use illicit drugs.  Family History  Problem Relation Age of Onset  . Hypertension Mother   . Heart disease Mother   . Hyperlipidemia Brother   . Hypertension Brother     Medications: Patient's Medications  New Prescriptions   No medications on file  Previous Medications   ASPIRIN EC 81 MG TABLET    Take 81 mg by mouth daily.   CALCIUM CARBONATE (OS-CAL) 600 MG TABS    Take 600 mg by mouth 2 (two) times daily with a meal.   CHOLECALCIFEROL (VITAMIN D PO)    Take 1 tablet by mouth daily.   DOXYCYCLINE (VIBRAMYCIN) 100 MG CAPSULE    Take 1 capsule (100 mg total) by mouth 2 (two) times daily.   EZETIMIBE-SIMVASTATIN (VYTORIN) 10-40 MG PER TABLET    Take 1 tablet by mouth at bedtime.   FEEDING SUPPLEMENT, RESOURCE BREEZE, (RESOURCE BREEZE) LIQD    Take 1 Container by mouth 3 (three) times daily between meals.   FERROUS SULFATE 325 (65 FE) MG TABLET    Take 1 tablet (325 mg total) by mouth daily with breakfast.   METOPROLOL TARTRATE (LOPRESSOR) 25 MG TABLET    Take 0.5 tablets (12.5 mg total) by mouth 2 (two) times daily.   MULTIPLE VITAMIN (MULTIVITAMIN) TABLET    Take 1 tablet by mouth daily.   RALOXIFENE (EVISTA) 60 MG TABLET    Take  60 mg by mouth at bedtime.    SENNA-DOCUSATE (SENOKOT-S) 8.6-50 MG PER TABLET    Take 2 tablets by mouth at bedtime.   TIMOLOL (BETIMOL) 0.25 % OPHTHALMIC SOLUTION    Place 1-2 drops into both eyes daily.  Modified Medications   No medications on file  Discontinued Medications   No medications on file     Physical Exam: Filed Vitals:   07/09/14 1426  BP: 94/53  Pulse: 78  Temp: 98.9 F (37.2 C)  Resp: 18  SpO2: 93%    General- elderly female, in no acute distress Head- normocephalic, atraumatic Throat- moist  mucus membrane Eyes- PERRLA, EOMI, no pallor, no icterus, no discharge Neck- no cervical lymphadenopathy Cardiovascular- normal s1,s2, no murmurs, palpable dorsalis pedis and radial pulses, no leg edema Respiratory- bilateral decreased air entry at bases, no wheeze, no rhonchi, no crackles, no use of accessory muscles Abdomen- bowel sounds present, soft, non tender Musculoskeletal- able to move all 4 extremities, generalized weakness  Neurological- no focal deficit Skin- warm and dry Psychiatry- alert and oriented to person, place and time, normal mood and affect    Labs reviewed: Basic Metabolic Panel:  Recent Labs  11/91/4704/29/16 0547 07/06/14 0525 07/07/14 0643  NA 137 133* 136  K 4.4 4.3 3.8  CL 105 101 102  CO2 25 25 28   GLUCOSE 100* 112* 109*  BUN 13 13 10   CREATININE 0.69 0.78 0.69  CALCIUM 8.2* 8.2* 8.5*   Liver Function Tests:  Recent Labs  07/03/14 1941 07/04/14 0146  AST 23 21  ALT 24 24  ALKPHOS 59 54  BILITOT 0.4 0.3  PROT 5.4* 5.1*  ALBUMIN 3.4* 3.2*    Recent Labs  07/04/14 0146  LIPASE 20   No results for input(s): AMMONIA in the last 8760 hours. CBC:  Recent Labs  07/03/14 1941 07/04/14 0146 07/05/14 0547 07/06/14 0525 07/06/14 1320 07/07/14 0643  WBC 7.7 11.6* 7.2 5.6  --  5.9  NEUTROABS 7.1 10.4*  --   --   --   --   HGB 10.4* 10.3* 9.2* 8.8*  --  9.5*  HCT 30.7* 30.1* 27.4* 25.7* 27.8* 28.1*  MCV 95.6 95.3 98.2 96.6  --  95.9  PLT 156 163 136* 168  --  138*   Cardiac Enzymes:  Recent Labs  07/03/14 1941 07/04/14 0146  TROPONINI <0.03 <0.03    Assessment/Plan  Physical deconditioning Will have her work with physical therapy and occupational therapy team to help with gait training and muscle strengthening exercises.fall precautions. Skin care. Encourage to be out of bed.   CAP To complete course of doxycycline until 07/16/14. Encouraged to use incentive spirometer.   Dry mouth Add mouth moisturizer - to use as needed.  Also add biotene  Hypotension Low bp reading. On coreg 12.5 mg bid, change this to once a day for now, check bp q shift and adjust medication further as needed  Iron deficiency anemia Monitor h&h, continue ferrous sulfate 325 mg daily  Protein calorie malnutrition Encourage po intake, continue feeding supplement, monitor weight  Osteoporosis Continue evista daily with oscal  Constipation Stable, continue senokot s qhs prn   Goals of care: short term rehabilitation   Labs/tests ordered: cbc with diff, cmp  Family/ staff Communication: reviewed care plan with patient, her daughter and nursing supervisor    Oneal GroutMAHIMA Edvin Albus, MD  [facility] [phone] (Monday-Friday 8 am - 5 pm) [phone] (afterhours)

## 2014-07-10 LAB — CULTURE, BLOOD (ROUTINE X 2)
Culture: NO GROWTH
Culture: NO GROWTH

## 2014-07-12 ENCOUNTER — Non-Acute Institutional Stay (SKILLED_NURSING_FACILITY): Payer: Medicare Other | Admitting: Adult Health

## 2014-07-12 ENCOUNTER — Encounter: Payer: Self-pay | Admitting: Adult Health

## 2014-07-12 DIAGNOSIS — R112 Nausea with vomiting, unspecified: Secondary | ICD-10-CM

## 2014-07-12 DIAGNOSIS — J189 Pneumonia, unspecified organism: Secondary | ICD-10-CM

## 2014-07-12 DIAGNOSIS — D509 Iron deficiency anemia, unspecified: Secondary | ICD-10-CM

## 2014-07-12 DIAGNOSIS — I10 Essential (primary) hypertension: Secondary | ICD-10-CM | POA: Diagnosis not present

## 2014-07-12 DIAGNOSIS — K5901 Slow transit constipation: Secondary | ICD-10-CM | POA: Diagnosis not present

## 2014-07-12 DIAGNOSIS — R5381 Other malaise: Secondary | ICD-10-CM

## 2014-07-12 DIAGNOSIS — M810 Age-related osteoporosis without current pathological fracture: Secondary | ICD-10-CM | POA: Diagnosis not present

## 2014-07-12 NOTE — Progress Notes (Signed)
Patient ID: Lisa [name], female   DOB: 11/17/1923, 81 y.o.   MRN: [id number]   07/12/15  Facility:  Nursing Home Location:  [facility] Room Number: 601-P LEVEL OF CARE:  SNF (31)   Chief Complaint  Patient presents with  . Discharge Note    Physical deconditioning, CAP, Hypertension, Constipation, Osteoporosis, Nausea and Anemia     HISTORY OF PRESENT ILLNESS:  This is an 81 year old female who is for discharge home with Home health PT for endurance, OT for ADLs and Home health aide for ADL  assistance. [facility] with [facility] Acquired Pneumonia. She is still on Doxycycline for 5 more days. No SOB, cough nor fever noted.  Patient was admitted to this facility for short-term rehabilitation after the patient's recent hospitalization.  Patient has completed SNF rehabilitation and therapy has cleared the patient for discharge.   PAST MEDICAL HISTORY:  Past Medical History  Diagnosis Date  . Arthritis   . Cataract   . Glaucoma   . Hypertension   . Osteoporosis     CURRENT MEDICATIONS: Reviewed per MAR/see medication list  Allergies  Allergen Reactions  . Mycostatin [Nystatin] Swelling and Rash     REVIEW OF SYSTEMS:  GENERAL: no change in appetite, no fatigue, no weight changes, no fever, chills or weakness RESPIRATORY: no cough, SOB, DOE, wheezing, hemoptysis CARDIAC: no chest pain, edema or palpitations GI: no abdominal pain, diarrhea, constipation, heart burn PHYSICAL EXAMINATION  GENERAL: no acute distress, normal body habitus NECK: supple, trachea midline, no neck masses, no thyroid tenderness, no thyromegaly LYMPHATICS: no LAN in the neck, no supraclavicular LAN RESPIRATORY: breathing is even & unlabored, BS CTAB CARDIAC: RRR, no murmur,no extra heart sounds, no edema GI: abdomen soft, normal BS, no masses, no tenderness, no hepatomegaly, no splenomegaly EXTREMITIES:  able to move X 4 extremities PSYCHIATRIC: the patient is alert & oriented to person, affect & behavior appropriate  LABS/RADIOLOGY: Labs reviewed: 07/10/14  WBC 6.3 hemoglobin 10.2 hematocrit 29.3 MCV 96.4 sodium 141 potassium 4.3 glucose 87 BUN 11 creatinine 0.54 total bilirubin 0.4 alkaline phosphatase 58 SGOT 27 SGPT 35 total protein 4.8 albumin 2.9 CA 8.4 Basic Metabolic Panel:  Recent Labs  78/46/9604/29/16 0547 07/06/14 0525 07/07/14 0643  NA 137 133* 136  K 4.4 4.3 3.8  CL 105 101 102  CO2 25 25 28   GLUCOSE 100* 112* 109*  BUN 13 13 10   CREATININE 0.69 0.78 0.69  CALCIUM 8.2* 8.2* 8.5*   Liver Function Tests:  Recent Labs  07/03/14 1941 07/04/14 0146  AST 23 21  ALT 24 24  ALKPHOS 59 54  BILITOT 0.4 0.3  PROT 5.4* 5.1*  ALBUMIN 3.4* 3.2*    Recent Labs  07/04/14 0146  LIPASE 20   CBC:  Recent Labs  07/03/14 1941 07/04/14 0146 07/05/14 0547 07/06/14 0525 07/06/14 1320 07/07/14 0643  WBC 7.7 11.6* 7.2 5.6  --  5.9  NEUTROABS 7.1 10.4*  --   --   --   --   HGB 10.4* 10.3* 9.2* 8.8*  --  9.5*  HCT 30.7* 30.1* 27.4* 25.7* 27.8* 28.1*  MCV 95.6 95.3 98.2 96.6  --  95.9  PLT 156 163 136* 168  --  138*   Cardiac Enzymes:  Recent Labs  07/03/14 1941 07/04/14 0146  TROPONINI <0.03 <0.03    Ct Angio Chest Pe W/cm &/or Wo Cm  07/04/2014  CLINICAL DATA:  Acute onset of nausea, vomiting, dizziness and lightheadedness. Initial encounter.  EXAM: CT ANGIOGRAPHY CHEST WITH CONTRAST  TECHNIQUE: Multidetector CT imaging of the chest was performed using the standard protocol during bolus administration of intravenous contrast. Multiplanar CT image reconstructions and MIPs were obtained to evaluate the vascular anatomy.  CONTRAST:  80mL OMNIPAQUE IOHEXOL 350 MG/ML SOLN  COMPARISON:  Chest radiograph performed 07/03/2014  FINDINGS: There is no evidence of pulmonary embolus.  Dependent bilateral lower lobe airspace opacification may reflect atelectasis or pneumonia.  Scattered peripheral nodular densities likely reflect atelectasis. Scattered bilateral calcified granulomata are seen. There is no evidence of pleural effusion or pneumothorax. No masses are identified; no abnormal focal contrast enhancement is seen.  Calcified nodes are noted at the subcarinal region, and about the distal esophagus. No mediastinal lymphadenopathy is seen. No pericardial effusion is identified. Scattered calcification is noted along the aortic arch. The great vessels are grossly unremarkable in appearance. No axillary lymphadenopathy is seen. The visualized portions of the thyroid gland are unremarkable in appearance.  The visualized portions of the liver and spleen are unremarkable. The visualized portions of the pancreas, gallbladder, stomach, adrenal glands and kidneys are within normal limits. Scattered diverticulosis is noted at the splenic flexure of the colon.  No acute osseous abnormalities are seen. There is mild chronic anterior fusion of multiple thoracic vertebral bodies.  Review of the MIP images confirms the above findings.  IMPRESSION: 1. No evidence of pulmonary embolus. 2. Dependent bilateral lower lobe airspace opacification may reflect atelectasis or pneumonia. 3. Calcified nodes at the subcarinal region, and about the distal esophagus, likely reflecting remote granulomatous disease. No mediastinal lymphadenopathy seen. 4. Scattered diverticulosis at the splenic flexure of the colon. 5. Mild chronic anterior fusion of multiple thoracic vertebral bodies.   Electronically Signed   By: Roanna Raider M.D.   On: 07/04/2014 00:25   Dg Chest Port 1 View  07/03/2014   CLINICAL DATA:  Syncope and shortness of breath for 1 day.  EXAM: PORTABLE CHEST - 1 VIEW  COMPARISON:  None.  FINDINGS: Mild enlargement of the cardiopericardial silhouette. No mediastinal or hilar masses or convincing adenopathy.  There are prominent markings in the lungs. Mild opacity at the right lung base partly  silhouettes the hemidiaphragm. This may be atelectasis. A small area of pneumonia is possible. No other evidence of pneumonia and no pulmonary edema.  Bony thorax is demineralized but grossly intact.  IMPRESSION: Mild opacity at the right lung base. Pneumonia is possible but this is more likely scarring or atelectasis.  No other evidence of acute cardiopulmonary disease. Mild cardiomegaly.   Electronically Signed   By: Amie Portland M.D.   On: 07/03/2014 19:33   Dg Abd 2 Views  07/04/2014   CLINICAL DATA:  81 year old female with abdominal pain, nausea and vomiting  EXAM: ABDOMEN - 2 VIEW  COMPARISON:  Chest x-ray and CT PE study performed earlier today  FINDINGS: Lateral decubitus radiograph demonstrates no evidence of free air. The bones are diffusely osteopenic. Contrast material present within the bladder. No evidence of bowel obstruction. Partial opacification of the bilateral renal collecting systems. Multilevel degenerative disc disease and dextro convex scoliosis.  IMPRESSION: 1. No evidence of obstruction or free air.   Electronically Signed   By: Malachy Moan M.D.   On: 07/04/2014 02:08    ASSESSMENT/PLAN:  Physical deconditioning - for Home health PT, OT and Home health aide CAP - continue Doxycycline 100 mg PO BID X  5 days Hypertension - well-controlled; continue Lopressor 25 mg take 1/2 tab= 12.5 mg PO BID Constipation -  continue Senna--S 1 tab PO Q HS PRN Osteoporosis - continue Evista 60 mg PO Q HS Anemia, iron deficiency - hgb 10.2; continue Iron 325 mg 1 tab PO Q D Nausea and Vomiting - continue Phenergan 12.5 mg PO Q 6 hours PRN    I have filled out patient's discharge paperwork and written prescriptions.  Patient will receive home health PT, OT and Home health aide.  Total discharge time: Less than 30 minutes  Discharge time involved coordination of the discharge process with Child psychotherapistsocial worker, nursing staff and therapy department. Medical justification for home health  services verified.   [facility],Hulda Reddix, NP [facility] [phone]

## 2014-07-12 NOTE — Progress Notes (Signed)
Patient ID: Lisa CanardNancy O [name], female   DOB: 11/03/1923, 81 y.o.   MRN: [id number]   07/08/14  Facility:  Nursing Home Location:  [facility] Room Number: 601-P LEVEL OF CARE:  SNF (31)   Chief Complaint  Patient presents with  . Hospitalization Follow-up    Physical deconditioning, CAP, Hypertension, Constipation, Osteoporosis, Anemia, Nausea and sore throat    HISTORY OF PRESENT ILLNESS:  This is an 81 year old female who [facility] with [facility] Acquired Pneumonia. She complains of being nauseated @ times and sore throat. No redness noted on throat.  She has been admitted for short-term rehabilitation.    PAST MEDICAL HISTORY:  Past Medical History  Diagnosis Date  . Arthritis   . Cataract   . Glaucoma   . Hypertension   . Osteoporosis     CURRENT MEDICATIONS: Reviewed per MAR/see medication list  Allergies  Allergen Reactions  . Mycostatin [Nystatin] Swelling and Rash     REVIEW OF SYSTEMS:  GENERAL: no change in appetite, no fatigue, no weight changes, no fever, chills or weakness RESPIRATORY: no cough, SOB, DOE, wheezing, hemoptysis CARDIAC: no chest pain, edema or palpitations GI: no abdominal pain, diarrhea, constipation, heart burn, nausea or vomiting  PHYSICAL EXAMINATION  GENERAL: no acute distress, normal body habitus EYES: conjunctivae normal, sclerae normal, normal eye lids NECK: supple, trachea midline, no neck masses, no thyroid tenderness, no thyromegaly LYMPHATICS: no LAN in the neck, no supraclavicular LAN RESPIRATORY: breathing is even & unlabored, BS CTAB CARDIAC: RRR, no murmur,no extra heart sounds, no edema GI: abdomen soft, normal BS, no masses, no tenderness, no hepatomegaly, no splenomegaly EXTREMITIES: able to move X 4 extremities PSYCHIATRIC: the patient is alert & oriented to person, affect & behavior appropriate  LABS/RADIOLOGY: Labs  reviewed: Basic Metabolic Panel:  Recent Labs  40/98/1104/29/16 0547 07/06/14 0525 07/07/14 0643  NA 137 133* 136  K 4.4 4.3 3.8  CL 105 101 102  CO2 25 25 28   GLUCOSE 100* 112* 109*  BUN 13 13 10   CREATININE 0.69 0.78 0.69  CALCIUM 8.2* 8.2* 8.5*   Liver Function Tests:  Recent Labs  07/03/14 1941 07/04/14 0146  AST 23 21  ALT 24 24  ALKPHOS 59 54  BILITOT 0.4 0.3  PROT 5.4* 5.1*  ALBUMIN 3.4* 3.2*    Recent Labs  07/04/14 0146  LIPASE 20   CBC:  Recent Labs  07/03/14 1941 07/04/14 0146 07/05/14 0547 07/06/14 0525 07/06/14 1320 07/07/14 0643  WBC 7.7 11.6* 7.2 5.6  --  5.9  NEUTROABS 7.1 10.4*  --   --   --   --   HGB 10.4* 10.3* 9.2* 8.8*  --  9.5*  HCT 30.7* 30.1* 27.4* 25.7* 27.8* 28.1*  MCV 95.6 95.3 98.2 96.6  --  95.9  PLT 156 163 136* 168  --  138*   Cardiac Enzymes:  Recent Labs  07/03/14 1941 07/04/14 0146  TROPONINI <0.03 <0.03    Ct Angio Chest Pe W/cm &/or Wo Cm  07/04/2014   CLINICAL DATA:  Acute onset of nausea, vomiting, dizziness and lightheadedness. Initial encounter.  EXAM: CT ANGIOGRAPHY CHEST WITH CONTRAST  TECHNIQUE: Multidetector CT imaging of the chest was performed using the standard protocol during bolus administration of intravenous contrast. Multiplanar CT image reconstructions and MIPs were obtained to evaluate the vascular anatomy.  CONTRAST:  80mL OMNIPAQUE IOHEXOL 350 MG/ML SOLN  COMPARISON:  Chest  radiograph performed 07/03/2014  FINDINGS: There is no evidence of pulmonary embolus.  Dependent bilateral lower lobe airspace opacification may reflect atelectasis or pneumonia. Scattered peripheral nodular densities likely reflect atelectasis. Scattered bilateral calcified granulomata are seen. There is no evidence of pleural effusion or pneumothorax. No masses are identified; no abnormal focal contrast enhancement is seen.  Calcified nodes are noted at the subcarinal region, and about the distal esophagus. No mediastinal  lymphadenopathy is seen. No pericardial effusion is identified. Scattered calcification is noted along the aortic arch. The great vessels are grossly unremarkable in appearance. No axillary lymphadenopathy is seen. The visualized portions of the thyroid gland are unremarkable in appearance.  The visualized portions of the liver and spleen are unremarkable. The visualized portions of the pancreas, gallbladder, stomach, adrenal glands and kidneys are within normal limits. Scattered diverticulosis is noted at the splenic flexure of the colon.  No acute osseous abnormalities are seen. There is mild chronic anterior fusion of multiple thoracic vertebral bodies.  Review of the MIP images confirms the above findings.  IMPRESSION: 1. No evidence of pulmonary embolus. 2. Dependent bilateral lower lobe airspace opacification may reflect atelectasis or pneumonia. 3. Calcified nodes at the subcarinal region, and about the distal esophagus, likely reflecting remote granulomatous disease. No mediastinal lymphadenopathy seen. 4. Scattered diverticulosis at the splenic flexure of the colon. 5. Mild chronic anterior fusion of multiple thoracic vertebral bodies.   Electronically Signed   By: Roanna Raider M.D.   On: 07/04/2014 00:25   Dg Chest Port 1 View  07/03/2014   CLINICAL DATA:  Syncope and shortness of breath for 1 day.  EXAM: PORTABLE CHEST - 1 VIEW  COMPARISON:  None.  FINDINGS: Mild enlargement of the cardiopericardial silhouette. No mediastinal or hilar masses or convincing adenopathy.  There are prominent markings in the lungs. Mild opacity at the right lung base partly silhouettes the hemidiaphragm. This may be atelectasis. A small area of pneumonia is possible. No other evidence of pneumonia and no pulmonary edema.  Bony thorax is demineralized but grossly intact.  IMPRESSION: Mild opacity at the right lung base. Pneumonia is possible but this is more likely scarring or atelectasis.  No other evidence of acute  cardiopulmonary disease. Mild cardiomegaly.   Electronically Signed   By: Amie Portland M.D.   On: 07/03/2014 19:33   Dg Abd 2 Views  07/04/2014   CLINICAL DATA:  81 year old female with abdominal pain, nausea and vomiting  EXAM: ABDOMEN - 2 VIEW  COMPARISON:  Chest x-ray and CT PE study performed earlier today  FINDINGS: Lateral decubitus radiograph demonstrates no evidence of free air. The bones are diffusely osteopenic. Contrast material present within the bladder. No evidence of bowel obstruction. Partial opacification of the bilateral renal collecting systems. Multilevel degenerative disc disease and dextro convex scoliosis.  IMPRESSION: 1. No evidence of obstruction or free air.   Electronically Signed   By: Malachy Moan M.D.   On: 07/04/2014 02:08    ASSESSMENT/PLAN:  Physical deconditioning - for rehabilitation CAP - continue Doxycycline 100 mg PO BID X 10 days Hypertension - continue Lopressor 25 mg take 1/2 tab PO BID; check BP/HR BID X 1 week Constipation -  Change Senna--S 1 tab PO Q HS PRN Osteoporosis - continue Evista 60 mg PO Q HS Anemia, iron deficiency - hgb 9.5; continue Iron 325 mg 1 tab PO Q D Nausea and Vomiting - start Phenergan 12.5 mg PO Q 6 hours PRN Sore throat - start Chloraseptic spray  2 sprays to throat Q 4 hours PRN   Goals of care:  Short-term rehabilitation   Labs/test ordered:  CBC, BMP in 1 week   Spent 50 minutes in patient care.    [facility],Zalman Hull, NP [facility] [phone]

## 2014-12-24 ENCOUNTER — Encounter: Payer: Self-pay | Admitting: Vascular Surgery

## 2014-12-27 ENCOUNTER — Ambulatory Visit (INDEPENDENT_AMBULATORY_CARE_PROVIDER_SITE_OTHER): Payer: Medicare Other | Admitting: Family

## 2014-12-27 ENCOUNTER — Ambulatory Visit (HOSPITAL_COMMUNITY)
Admission: RE | Admit: 2014-12-27 | Discharge: 2014-12-27 | Disposition: A | Discharge status: Home / Self Care | Payer: Medicare Other | Source: Ambulatory Visit | Attending: Family | Admitting: Family

## 2014-12-27 ENCOUNTER — Encounter: Payer: Self-pay | Admitting: Family

## 2014-12-27 ENCOUNTER — Other Ambulatory Visit: Payer: Self-pay | Admitting: Vascular Surgery

## 2014-12-27 VITALS — BP 131/83 | HR 89 | Temp 98.2°F | Resp 18 | Ht 61.0 in | Wt 115.0 lb

## 2014-12-27 DIAGNOSIS — I872 Venous insufficiency (chronic) (peripheral): Secondary | ICD-10-CM | POA: Diagnosis not present

## 2014-12-27 DIAGNOSIS — I6523 Occlusion and stenosis of bilateral carotid arteries: Secondary | ICD-10-CM | POA: Diagnosis present

## 2014-12-27 DIAGNOSIS — I10 Essential (primary) hypertension: Secondary | ICD-10-CM | POA: Insufficient documentation

## 2014-12-27 NOTE — Progress Notes (Signed)
Filed Vitals:   12/27/14 1352 12/27/14 1355 12/27/14 1401 12/27/14 1402  BP: 152/77 145/84 129/77 131/83  Pulse: 96 92 87 89  Temp:  98.2 F (36.8 C)    TempSrc:  Oral    Resp:  18    Height:  5\' 1"  (1.549 m)    Weight:  115 lb (52.164 kg)    SpO2:  97%

## 2014-12-27 NOTE — Progress Notes (Signed)
Established Carotid Patient   History of Present Illness  Lisa [name] is a 81 y.o. female patient of Dr. Imogene Burn who presents with chief complaint: abnormal carotid studies. Previous carotid studies demonstrated: RICA <50% stenosis, LICA <50% stenosis. Pt had B carotid duplex completed due to R carotid bruit. Patient has no history of TIA or stroke symptom. The patient has never had amaurosis fugax or monocular blindness. The patient has never had facial drooping or hemiplegia. The patient has never had receptive or expressive aphasia. The patient's risks factors for carotid disease include: HTN, HLD.   The patient reports New Medical or Surgical History: was hospitalized for 5 days at [facility] in April 2016 with pneumonia.Since this she has not needed blood pressure medication.   Pt Diabetic: no Pt smoker: non-smoker  Pt meds include: Statin : yes ASA: yes Other anticoagulants/antiplatelets: no   Past Medical History  Diagnosis Date  . Arthritis   . Cataract   . Glaucoma   . Hypertension   . Osteoporosis   . Carotid artery occlusion   . Pneumonia July 03, 2014    Social History Social History  Substance Use Topics  . Smoking status: Never Smoker   . Smokeless tobacco: Never Used  . Alcohol Use: No    Family History Family History  Problem Relation Age of Onset  . Hypertension Mother   . Heart disease Mother   . Hyperlipidemia Brother   . Hypertension Brother     Surgical History Past Surgical History  Procedure Laterality Date  . Appendectomy    . Eye surgery    . Abdominal hysterectomy      Allergies  Allergen Reactions  . Mycostatin [Nystatin] Swelling and Rash    Current Outpatient Prescriptions  Medication Sig Dispense Refill  . aspirin EC 81 MG tablet Take 81 mg by mouth daily.    . calcium carbonate (OS-CAL) 600 MG TABS Take 600 mg by mouth 2 (two) times daily with a meal.    . Cholecalciferol (VITAMIN D PO) Take 1 tablet by mouth daily.     Marland Kitchen ezetimibe-simvastatin (VYTORIN) 10-40 MG per tablet Take 1 tablet by mouth at bedtime.    . Multiple Vitamin (MULTIVITAMIN) tablet Take 1 tablet by mouth daily.    . raloxifene (EVISTA) 60 MG tablet Take 60 mg by mouth at bedtime.     . timolol (BETIMOL) 0.25 % ophthalmic solution Place 1-2 drops into both eyes daily.    Marland Kitchen doxycycline (VIBRAMYCIN) 100 MG capsule Take 1 capsule (100 mg total) by mouth 2 (two) times daily. (Patient not taking: Reported on 12/27/2014) 20 capsule 0  . feeding supplement, RESOURCE BREEZE, (RESOURCE BREEZE) LIQD Take 1 Container by mouth 3 (three) times daily between meals. (Patient not taking: Reported on 12/27/2014) 90 Container 0  . ferrous sulfate 325 (65 FE) MG tablet Take 1 tablet (325 mg total) by mouth daily with breakfast. (Patient not taking: Reported on 12/27/2014) 30 tablet 3  . metoprolol tartrate (LOPRESSOR) 25 MG tablet Take 0.5 tablets (12.5 mg total) by mouth 2 (two) times daily. (Patient not taking: Reported on 12/27/2014) 60 tablet 3  . senna-docusate (SENOKOT-S) 8.6-50 MG per tablet Take 2 tablets by mouth at bedtime. (Patient not taking: Reported on 12/27/2014) 30 tablet 0   No current facility-administered medications for this visit.    Review of Systems : See HPI for pertinent positives and negatives.  Physical Examination  Filed Vitals:   12/27/14 1352 12/27/14 1355 12/27/14 1401 12/27/14 1402  BP: 152/77 145/84 129/77 131/83  Pulse: 96 92 87 89  Temp:  98.2 F (36.8 C)    TempSrc:  Oral    Resp:  18    Height:  5\' 1"  (1.549 m)    Weight:  115 lb (52.164 kg)    SpO2:  97%     Body mass index is 21.74 kg/(m^2).  General: A&O x 3, WDWN  Head: [place]/AT  Eyes: PERRLA, Post surg chg to lenses  Pulmonary: Sym exp, good air movt, CTAB, no rales, rhonchi, & wheezing  Cardiac: RRR, Nl S1, S2, no murmurs  Vascular: Vessel Right Left  Radial Faintly Palpable Faintly Palpable  Brachial Palpable Palpable  Carotid Palpable,  without bruit Palpable, without bruit  Aorta Not palpable N/A  Popliteal Not palpable Not palpable  PT Not Palpable Not Palpable  DP Faintly Palpable Faintly Palpable   Gastrointestinal: soft, NTND, -G/R, - HSM, - palpable masses, - CVAT B  Musculoskeletal: M/S 5/5 throughout , Extremities without ischemic changes,  B varicose veins and spider veins, no LDS  Neurologic: CN 2-12 intact except for significant hearing loss, pain and light touch intact in extremities , motor exam as listed above.  Psychiatric: Judgment intact, mood & affect appropriate for pt's clinical situation. Loquacious.   Dermatologic: See M/S exam for extremity exam, no rashes otherwise noted        Non-Invasive Vascular Imaging CAROTID DUPLEX 12/27/2014   CEREBROVASCULAR DUPLEX EVALUATION    INDICATION: Carotid artery stenosis    PREVIOUS INTERVENTION(S): NA    DUPLEX EXAM:     RIGHT  LEFT  Peak Systolic Velocities (cm/s) End Diastolic Velocities (cm/s) Plaque LOCATION Peak Systolic Velocities (cm/s) End Diastolic Velocities (cm/s) Plaque  108 19  CCA PROXIMAL 110 23   104 19  CCA MID 99 28   64 13 CP CCA DISTAL 85 23 HT  85 7 CP ECA 91 7 HT  102 24 HT ICA PROXIMAL 105 28 HT  128 40  ICA MID 117 35   105 30  ICA DISTAL 104 31     0.98 ICA / CCA Ratio (PSV) 1.06  Antegrade Vertebral Flow Antegrade  NA Brachial Systolic Pressure (mmHg) NA  NA Brachial Artery Waveforms NA    Plaque Morphology:  HM = Homogeneous, HT = Heterogeneous, CP = Calcific Plaque, SP = Smooth Plaque, IP = Irregular Plaque     ADDITIONAL FINDINGS: Right subclavian artery PSV166cm/sec; Left subclavian artery PSV125cm/sec    IMPRESSION: Bilateral carotid artery tortuousity present throughout. Bilateral internal carotid artery stenosis present in the less than 40% range.    Compared to the previous exam:  No previous [place] study to compare.      Assessment: Lisa [name] is a 81 y.o. female who has  no history of stroke or TIA. Today's carotid duplex suggests bilateral carotid artery tortuousity present throughout and <40% stenosis of the bilateral internal carotid arteries. Vertebral arteries are antegrade. No significant change from previous carotid studies from an outside facility.  Plan: Follow-up in 1 year with Carotid Duplex scan.   I discussed in depth with the patient the nature of atherosclerosis, and emphasized the importance of maximal medical management including strict control of blood pressure, blood glucose, and lipid levels, obtaining regular exercise, and continued cessation of smoking.  The patient is aware that without maximal medical management the underlying atherosclerotic disease process will progress, limiting the benefit of any interventions. The patient was given information about stroke prevention and what  symptoms should prompt the patient to seek immediate medical care. Thank you for allowing [place] to participate in this patient's care.  Charisse March, RN, MSN, FNP-C [facility] Office: [phone]  Clinic Physician: Imogene Burn  12/27/2014 2:27 PM

## 2014-12-27 NOTE — Patient Instructions (Signed)
Stroke Prevention Some medical conditions and behaviors are associated with an increased chance of having a stroke. You may prevent a stroke by making healthy choices and managing medical conditions. HOW CAN I REDUCE MY RISK OF HAVING A STROKE?   Stay physically active. Get at least 30 minutes of activity on most or all days.  Do not smoke. It may also be helpful to avoid exposure to secondhand smoke.  Limit alcohol use. Moderate alcohol use is considered to be:  No more than 2 drinks per day for men.  No more than 1 drink per day for nonpregnant women.  Eat healthy foods. This involves:  Eating 5 or more servings of fruits and vegetables a day.  Making dietary changes that address high blood pressure (hypertension), high cholesterol, diabetes, or obesity.  Manage your cholesterol levels.  Making food choices that are high in fiber and low in saturated fat, trans fat, and cholesterol may control cholesterol levels.  Take any prescribed medicines to control cholesterol as directed by your health care provider.  Manage your diabetes.  Controlling your carbohydrate and sugar intake is recommended to manage diabetes.  Take any prescribed medicines to control diabetes as directed by your health care provider.  Control your hypertension.  Making food choices that are low in salt (sodium), saturated fat, trans fat, and cholesterol is recommended to manage hypertension.  Ask your health care provider if you need treatment to lower your blood pressure. Take any prescribed medicines to control hypertension as directed by your health care provider.  If you are 18-39 years of age, have your blood pressure checked every 3-5 years. If you are 40 years of age or older, have your blood pressure checked every year.  Maintain a healthy weight.  Reducing calorie intake and making food choices that are low in sodium, saturated fat, trans fat, and cholesterol are recommended to manage  weight.  Stop drug abuse.  Avoid taking birth control pills.  Talk to your health care provider about the risks of taking birth control pills if you are over 35 years old, smoke, get migraines, or have ever had a blood clot.  Get evaluated for sleep disorders (sleep apnea).  Talk to your health care provider about getting a sleep evaluation if you snore a lot or have excessive sleepiness.  Take medicines only as directed by your health care provider.  For some people, aspirin or blood thinners (anticoagulants) are helpful in reducing the risk of forming abnormal blood clots that can lead to stroke. If you have the irregular heart rhythm of atrial fibrillation, you should be on a blood thinner unless there is a good reason you cannot take them.  Understand all your medicine instructions.  Make sure that other conditions (such as anemia or atherosclerosis) are addressed. SEEK IMMEDIATE MEDICAL CARE IF:   You have sudden weakness or numbness of the face, arm, or leg, especially on one side of the body.  Your face or eyelid droops to one side.  You have sudden confusion.  You have trouble speaking (aphasia) or understanding.  You have sudden trouble seeing in one or both eyes.  You have sudden trouble walking.  You have dizziness.  You have a loss of balance or coordination.  You have a sudden, severe headache with no known cause.  You have new chest pain or an irregular heartbeat. Any of these symptoms may represent a serious problem that is an emergency. Do not wait to see if the symptoms will   go away. Get medical help at once. Call your local emergency services (911 in [place] Do not drive yourself to the hospital.   This information is not intended to replace advice given to you by your health care provider. Make sure you discuss any questions you have with your health care provider.   Document Released: 04/01/2004 Document Revised: 03/15/2014 Document Reviewed:  08/25/2012 Elsevier Interactive Patient Education 2016 [employer]     Venous Stasis or Chronic Venous Insufficiency Chronic venous insufficiency, also called venous stasis, is a condition that affects the veins in the legs. The condition prevents blood from being pumped through these veins effectively. Blood may no longer be pumped effectively from the legs back to the heart. This condition can range from mild to severe. With proper treatment, you should be able to continue with an active life. CAUSES  Chronic venous insufficiency occurs when the vein walls become stretched, weakened, or damaged or when valves within the vein are damaged. Some common causes of this include:  High blood pressure inside the veins (venous hypertension).  Increased blood pressure in the leg veins from long periods of sitting or standing.  A blood clot that blocks blood flow in a vein (deep vein thrombosis).  Inflammation of a superficial vein (phlebitis) that causes a blood clot to form. RISK FACTORS Various things can make you more likely to develop chronic venous insufficiency, including:  Family history of this condition.  Obesity.  Pregnancy.  Sedentary lifestyle.  Smoking.  Jobs requiring long periods of standing or sitting in one place.  Being a certain age. Women in their 40s and 50s and men in their 70s are more likely to develop this condition. SIGNS AND SYMPTOMS  Symptoms may include:   Varicose veins.  Skin breakdown or ulcers.  Reddened or discolored skin on the leg.  Brown, smooth, tight, and painful skin just above the ankle, usually on the inside surface (lipodermatosclerosis).  Swelling. DIAGNOSIS  To diagnose this condition, your health care provider will take a medical history and do a physical exam. The following tests may be ordered to confirm the diagnosis:  Duplex ultrasound--A procedure that produces a picture of a blood vessel and nearby organs and also  provides information on blood flow through the blood vessel.  Plethysmography--A procedure that tests blood flow.  A venogram, or venography--A procedure used to look at the veins using X-ray and dye. TREATMENT The goals of treatment are to help you return to an active life and to minimize pain or disability. Treatment will depend on the severity of the condition. Medical procedures may be needed for severe cases. Treatment options may include:   Use of compression stockings. These can help with symptoms and lower the chances of the problem getting worse, but they do not cure the problem.  Sclerotherapy--A procedure involving an injection of a material that "dissolves" the damaged veins. Other veins in the network of blood vessels take over the function of the damaged veins.  Surgery to remove the vein or cut off blood flow through the vein (vein stripping or laser ablation surgery).  Surgery to repair a valve. HOME CARE INSTRUCTIONS   Wear compression stockings as directed by your health care provider.  Only take over-the-counter or prescription medicines for pain, discomfort, or fever as directed by your health care provider.  Follow up with your health care provider as directed. SEEK MEDICAL CARE IF:   You have redness, swelling, or increasing pain in the affected area.    You see a red streak or line that extends up or down from the affected area.  You have a breakdown or loss of skin in the affected area, even if the breakdown is small.  You have an injury to the affected area. SEEK IMMEDIATE MEDICAL CARE IF:   You have an injury and open wound in the affected area.  Your pain is severe and does not improve with medicine.  You have sudden numbness or weakness in the foot or ankle below the affected area, or you have trouble moving your foot or ankle.  You have a fever or persistent symptoms for more than 2-3 days.  You have a fever and your symptoms suddenly get  worse. MAKE SURE YOU:   Understand these instructions.  Will watch your condition.  Will get help right away if you are not doing well or get worse.   This information is not intended to replace advice given to you by your health care provider. Make sure you discuss any questions you have with your health care provider.   Document Released: 06/28/2006 Document Revised: 12/13/2012 Document Reviewed: 10/30/2012 Elsevier Interactive Patient Education 2016 [employer] Inc.  

## 2014-12-30 NOTE — Addendum Note (Signed)
Addended by: Adria DillELDRIDGE-LEWIS, Ethanjames Fontenot L on: 12/30/2014 01:02 PM   Modules accepted: Orders

## 2015-03-13 ENCOUNTER — Encounter: Payer: Self-pay | Admitting: Neurology

## 2015-03-13 ENCOUNTER — Encounter: Payer: Self-pay | Admitting: *Deleted

## 2015-03-13 ENCOUNTER — Encounter (INDEPENDENT_AMBULATORY_CARE_PROVIDER_SITE_OTHER): Payer: Self-pay

## 2015-03-13 ENCOUNTER — Ambulatory Visit (INDEPENDENT_AMBULATORY_CARE_PROVIDER_SITE_OTHER): Payer: Medicare Other | Admitting: Neurology

## 2015-03-13 VITALS — BP 144/81 | HR 93 | Ht 61.0 in | Wt 121.4 lb

## 2015-03-13 DIAGNOSIS — G250 Essential tremor: Secondary | ICD-10-CM

## 2015-03-13 MED ORDER — PRIMIDONE 50 MG PO TABS: ORAL_TABLET | ORAL | Status: DC

## 2015-03-13 NOTE — Patient Instructions (Signed)
Remember to drink plenty of fluid, eat healthy meals and do not skip any meals. Try to eat protein with a every meal and eat a healthy snack such as fruit or nuts in between meals. Try to keep a regular sleep-wake schedule and try to exercise daily, particularly in the form of walking, 20-30 minutes a day, if you can.   As far as your medications are concerned, I would like to suggest: primidone (Mysoline) 1/2 a pill (25mg ) and then can increase to a whole pill.  I would like to see you back in 3 months, sooner if we need to. Please call us with any interim questions, concerns, problems, updates or refill requests.   Our phone number is [phone]. We also have an after hours call service for urgent matters and there is a physician on-call for urgent questions. For any emergencies you know to call 911 or go to the nearest emergency room

## 2015-03-13 NOTE — Progress Notes (Signed)
[facility]    Provider:  Dr Lucia Gaskins Referring Provider: [name], MD Primary Care Physician:  [name], MD  CC:  tremor  HPI:  Lisa [name] is a 82 y.o. female here as a referral from Dr. Zachery Dauer for tremor. PMHx of CAP, asymptomatic carotid artery disease, Chronic venous insufficiency, Acute resp failure with hypoxia, HTN, dizziness, glucose intolerance, osteoporosis, benign essential tremor, anemia, allergic rhinitis,, HLD, glaucoma. She has had a tremor for years. Started 10 years ago in the right hand and then to the left hand then to the face. Worsening over the last year. No pain just very annoying. Continuous. It is embarrassing. She was on a b-blocker and that was discontinued. She was dizzy on the b-blocker with hypotension. The beta blocker help the tremor, Worsened since stopping the b-blocker. The tremor is continuous. Her mohter had a tremor as well as her brother. Slowly progressive. Daughter is with her and also provides information.  Reviewed notes, labs and imaging from outside physicians, which showed:  Cbc with anemia (9.5/28.1), BMP unremarkable, B12 745, TSH 3.120  MRI of the brain 2005: MRI BRAIN WO/W CONTRAST 15 cc IV Omniscan. No comparison. There is moderate cortical atrophy. There are scattered small subcortical and periventricular white matter hyperintensities as well as a 1 cm hyperintensity in the left inferior temporal lobe. 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 There also is some minimal patchy hyperintensity in the pons bilaterally compatible with chronic ischemic change. Diffusion weighted imaging is negative for acute infarct. The enhancement pattern is normal and there is no mass. IMPRESSION Chronic small vessel ischemic changes. No acute abnormalities  Reviewed records from [facility] physicians. She also has dyslipidemia and impaired fasting glucose. Patient (solving [place]. Spent a good move for her. She  enjoys activities in the company. She the dining hall twice a day is probably eating better but is also exercising more. She has a history of benign essential tremor primarily in her jaw. She was on a beta blocker without discontinued due to low blood pressure and becoming dizzy. He frequently clears her throat has a lot of sneezing or blowing her nose.    Review of Systems: Patient complains of symptoms per HPI as well as the following symptoms: Tremor, hearing loss. Pertinent negatives per HPI. All others negative.   Social History   Social History  . Marital Status: Widowed    Spouse Name: N/A  . Number of Children: 3  . Years of Education: 16   Occupational History  . n/a    Social History Main Topics  . Smoking status: Never Smoker   . Smokeless tobacco: Never Used  . Alcohol Use: 0.0 oz/week    0 Standard drinks or equivalent per week     Comment: rare  . Drug Use: No  . Sexual Activity: Not on file   Other Topics Concern  . Not on file   Social History Narrative   Lives alone   Caffeine use: none    Family History  Problem Relation Age of Onset  . Hypertension Mother   . Heart disease Mother   . Hyperlipidemia Brother   . Hypertension Brother     Past Medical History  Diagnosis Date  . Arthritis   . Cataract   . Glaucoma   . Hypertension   . Osteoporosis   . Carotid artery occlusion   . Pneumonia July 03, 2014  . High cholesterol     Past Surgical History  Procedure Laterality Date  .  Appendectomy    . Eye surgery    . Abdominal hysterectomy  1969    Current Outpatient Prescriptions  Medication Sig Dispense Refill  . aspirin EC 81 MG tablet Take 81 mg by mouth daily.    . calcium carbonate (OS-CAL) 600 MG TABS Take 600 mg by mouth 2 (two) times daily with a meal.    . diptheria-tetanus toxoids (DECAVAC) 2-2 LF/0.5ML injection   0  . Multiple Vitamin (MULTIVITAMIN) tablet Take 1 tablet by mouth daily.    . raloxifene (EVISTA) 60 MG tablet Take  60 mg by mouth at bedtime.     . timolol (TIMOPTIC) 0.5 % ophthalmic solution   1  . VYTORIN 10-20 MG tablet Take 1 tablet by mouth daily.  0  . primidone (MYSOLINE) 50 MG tablet Start with a 1/2 pill at night and increase to a whole pill. 30 tablet 6   No current facility-administered medications for this visit.    Allergies as of 03/13/2015 - Review Complete 03/13/2015  Allergen Reaction Noted  . Mycostatin [nystatin] Swelling and Rash 11/28/2011    Vitals: BP 144/81 mmHg  Pulse 93  Ht 5\' 1"  (1.549 m)  Wt 121 lb 6.4 oz (55.067 kg)  BMI 22.95 kg/m2 Last Weight:  Wt Readings from Last 1 Encounters:  03/13/15 121 lb 6.4 oz (55.067 kg)   Last Height:   Ht Readings from Last 1 Encounters:  03/13/15 5\' 1"  (1.549 m)   Physical exam: Exam: Gen: NAD, conversant                  CV: RRR, no MRG. No Carotid Bruits. No peripheral edema, warm, nontender Eyes: Conjunctivae clear without exudates or hemorrhage  Neuro: Detailed Neurologic Exam  Speech:    Speech is normal; fluent and spontaneous with normal comprehension.  Cognition:    The patient is oriented to person, place, and time;     recent and remote memory intact;     language fluent;     normal attention, concentration,     fund of knowledge Cranial Nerves:    The pupils are equal, round, and reactive to light. The fundi are flat. Visual fields are full to finger confrontation. Extraocular movements are intact. Trigeminal sensation is intact and the muscles of mastication are normal. The face is symmetric. The palate elevates in the midline. Hearing impaired. Voice is normal. Shoulder shrug is normal. The tongue has normal motion without fasciculations.   Coordination:    Normal finger to nose and heel to shin. Normal rapid alternating movements.   Gait:    Not ataxic, uses a cane  Motor Observation:    High frequency, low amplitude postural, voice and chin tremor Tone:    Normal muscle tone.    Posture:     Mildly stooped    Strength:    Strength is V/V in the upper and lower limbs.      Sensation: intact to LT     Reflex Exam:  DTR's: absent AJs  Toes:    The toes are equivocal bilaterally.   Clonus:    Clonus is absent.       Assessment/Plan:  Patient is a lovely 82 year old female here with familial essential tremor. A beta blocker worked in the past but had to be discontinued due to hypotension and dizziness. We'll start primidone/Mysoline. Discussed that this medication is a barbiturate and can also be very sedating. We'll start very very low and increase slowly. Watch for sedation especially  when she gets up in the middle of the next visit to the bathroom. If she has any side effects at all she is to call me. dizziness, drowsiness, spinning sensation; nausea, vomiting, loss of appetite; feeling irritable; blurred vision; mild skin rash. Started 25 mg daily at bedtime and can increase to 50mg  as tolerated.   Naomie Dean, MD  [facility] [address] [place], [place] [address]  Phone [phone] Fax [phone]

## 2015-04-16 DIAGNOSIS — D0439 Carcinoma in situ of skin of other parts of face: Secondary | ICD-10-CM | POA: Diagnosis not present

## 2015-04-16 DIAGNOSIS — D485 Neoplasm of uncertain behavior of skin: Secondary | ICD-10-CM | POA: Diagnosis not present

## 2015-04-16 DIAGNOSIS — L820 Inflamed seborrheic keratosis: Secondary | ICD-10-CM | POA: Diagnosis not present

## 2015-04-22 DIAGNOSIS — H6123 Impacted cerumen, bilateral: Secondary | ICD-10-CM | POA: Diagnosis not present

## 2015-05-16 DIAGNOSIS — D3132 Benign neoplasm of left choroid: Secondary | ICD-10-CM | POA: Diagnosis not present

## 2015-05-16 DIAGNOSIS — H4010X1 Unspecified open-angle glaucoma, mild stage: Secondary | ICD-10-CM | POA: Diagnosis not present

## 2015-05-16 DIAGNOSIS — H31002 Unspecified chorioretinal scars, left eye: Secondary | ICD-10-CM | POA: Diagnosis not present

## 2015-05-16 DIAGNOSIS — Z961 Presence of intraocular lens: Secondary | ICD-10-CM | POA: Diagnosis not present

## 2015-06-12 ENCOUNTER — Ambulatory Visit (INDEPENDENT_AMBULATORY_CARE_PROVIDER_SITE_OTHER): Payer: Medicare Other | Admitting: Neurology

## 2015-06-12 ENCOUNTER — Encounter (INDEPENDENT_AMBULATORY_CARE_PROVIDER_SITE_OTHER): Payer: Self-pay

## 2015-06-12 VITALS — BP 130/76 | HR 93 | Ht 61.0 in | Wt 133.6 lb

## 2015-06-12 DIAGNOSIS — G250 Essential tremor: Secondary | ICD-10-CM

## 2015-06-12 MED ORDER — PRIMIDONE 50 MG PO TABS: 100.0000 mg | ORAL_TABLET | Freq: Every day | ORAL | Status: DC

## 2015-06-12 NOTE — Progress Notes (Signed)
[facility]    Provider:  Dr Lucia Gaskins Referring Provider: [name], MD Primary Care Physician:  [name], MD  CC: tremor  She is eating more and better. She is on more of a routine at the nursing home. She is on a whole pill of primidone at night. She does not feel that the primidone is helping her however she is only on 50 mg at night. Discussed that we may have to increase this medication more than not to see results. Propranolol the past helped but was discontinued due to side effects. Discussed with daughter and patient again. She denies any side effects at all to the primidone. Let daughter know that she doesn't have to wait several months if the medication is not working, they can call me and I'm happy to increase it. We'll plan on increasing the primidone very slowly to 100 mg at night. They understand that we may have to go up much more than that and that this may be limited by side effects. Can cause sedation in the middle of the night, advised patient to keep lights on, and if she has any side effects or any increased sedation that she should go back to the last dose of the primidone that was tolerated. Discussed other medications we could try including Neurontin, Keppra, Topamax for essential tremor. Propranolol and primidone are the most effective however Neurontin often works as well.  HPI: Lisa [name] is a 81 y.o. female here as a referral from Dr. Zachery Dauer for tremor. PMHx of CAP, asymptomatic carotid artery disease, Chronic venous insufficiency, Acute resp failure with hypoxia, HTN, dizziness, glucose intolerance, osteoporosis, benign essential tremor, anemia, allergic rhinitis,, HLD, glaucoma. She has had a tremor for years. Started 10 years ago in the right hand and then to the left hand then to the face. Worsening over the last year. No pain just very annoying. Continuous. It is embarrassing. She was on a b-blocker and that was  discontinued. She was dizzy on the b-blocker with hypotension. The beta blocker help the tremor, Worsened since stopping the b-blocker. The tremor is continuous. Her mohter had a tremor as well as her brother. Slowly progressive. Daughter is with her and also provides information.  Reviewed notes, labs and imaging from outside physicians, which showed:  Cbc with anemia (9.5/28.1), BMP unremarkable, B12 745, TSH 3.120  MRI of the brain 2005: MRI BRAIN WO/W CONTRAST 15 cc IV Omniscan. No comparison. There is moderate cortical atrophy. There are scattered small subcortical and periventricular white matter hyperintensities as well as a 1 cm hyperintensity in the left inferior temporal lobe. 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 There also is some minimal patchy hyperintensity in the pons bilaterally compatible with chronic ischemic change. Diffusion weighted imaging is negative for acute infarct. The enhancement pattern is normal and there is no mass. IMPRESSION Chronic small vessel ischemic changes. No acute abnormalities  Reviewed records from [facility] physicians. She also has dyslipidemia and impaired fasting glucose. Patient (solving [place]. Spent a good move for her. She enjoys activities in the company. She the dining hall twice a day is probably eating better but is also exercising more. She has a history of benign essential tremor primarily in her jaw. She was on a beta blocker without discontinued due to low blood pressure and becoming dizzy. He frequently clears her throat has a lot of sneezing or blowing her nose.   Review of Systems: Patient complains of symptoms per HPI as well as the following  symptoms: Tremor, hearing loss. Pertinent negatives per HPI. All others negative.     Social History   Social History  . Marital Status: Widowed    Spouse Name: N/A  . Number of Children: 3  . Years of Education: 16   Occupational History  . n/a    Social History Main Topics    . Smoking status: Never Smoker   . Smokeless tobacco: Never Used  . Alcohol Use: 0.0 oz/week    0 Standard drinks or equivalent per week     Comment: rare  . Drug Use: No  . Sexual Activity: Not on file   Other Topics Concern  . Not on file   Social History Narrative   Lives alone   Caffeine use: none    Family History  Problem Relation Age of Onset  . Hypertension Mother   . Heart disease Mother   . Hyperlipidemia Brother   . Hypertension Brother     Past Medical History  Diagnosis Date  . Arthritis   . Cataract   . Glaucoma   . Hypertension   . Osteoporosis   . Carotid artery occlusion   . Pneumonia July 03, 2014  . High cholesterol     Past Surgical History  Procedure Laterality Date  . Appendectomy    . Eye surgery    . Abdominal hysterectomy  1969    Current Outpatient Prescriptions  Medication Sig Dispense Refill  . aspirin EC 81 MG tablet Take 81 mg by mouth daily.    . calcium carbonate (OS-CAL) 600 MG TABS Take 600 mg by mouth 2 (two) times daily with a meal.    . diptheria-tetanus toxoids (DECAVAC) 2-2 LF/0.5ML injection   0  . Multiple Vitamin (MULTIVITAMIN) tablet Take 1 tablet by mouth daily.    . primidone (MYSOLINE) 50 MG tablet Start with a 1/2 pill at night and increase to a whole pill. 30 tablet 6  . raloxifene (EVISTA) 60 MG tablet Take 60 mg by mouth at bedtime.     . timolol (TIMOPTIC) 0.5 % ophthalmic solution   1  . VYTORIN 10-20 MG tablet Take 1 tablet by mouth daily.  0   No current facility-administered medications for this visit.    Allergies as of 06/12/2015 - Review Complete 06/12/2015  Allergen Reaction Noted  . Mycostatin [nystatin] Swelling and Rash 11/28/2011    Vitals: BP 130/76 mmHg  Pulse 93  Ht 5\' 1"  (1.549 m)  Wt 133 lb 9.6 oz (60.601 kg)  BMI 25.26 kg/m2 Last Weight:  Wt Readings from Last 1 Encounters:  06/12/15 133 lb 9.6 oz (60.601 kg)   Last Height:   Ht Readings from Last 1 Encounters:  06/12/15  5\' 1"  (1.549 m)     Physical exam: Exam: Gen: NAD, conversant  CV: RRR, no MRG. No Carotid Bruits. No peripheral edema, warm, nontender Eyes: Conjunctivae clear without exudates or hemorrhage  Neuro: Detailed Neurologic Exam  Speech:  Speech is normal; fluent and spontaneous with normal comprehension.  Cognition:  The patient is oriented to person, place, and time;   recent and remote memory intact;   language fluent;   normal attention, concentration,   fund of knowledge Cranial Nerves:  The pupils are equal, round, and reactive to light. The fundi are flat. Visual fields are full to finger confrontation. Extraocular movements are intact. Trigeminal sensation is intact and the muscles of mastication are normal. The face is symmetric. The palate elevates in the midline. Hearing impaired.  Voice is normal. Shoulder shrug is normal. The tongue has normal motion without fasciculations.   Coordination:  Normal finger to nose and heel to shin. Normal rapid alternating movements.   Gait:  Not ataxic, uses a cane  Motor Observation:  High frequency, low amplitude upper extremity postural, also a voice and chin tremor Tone:  Normal muscle tone.   Posture:  Mildly stooped   Strength:  Strength is V/V in the upper and lower limbs.    Sensation: intact to LT   Reflex Exam:  DTR's: absent AJs  Toes:  The toes are equivocal bilaterally.  Clonus:  Clonus is absent.      Assessment/Plan: Patient is a lovely 82 year old female here with familial essential tremor. A beta blocker worked in the past but had to be discontinued due to hypotension and dizziness. We'll increase primidone/Mysoline. Discussed that this medication is a barbiturate and can also be very sedating. We'll increase slowly. Watch for sedation especially when she gets up in the middle of the next visit to the bathroom. If she has any side effects  at all she is to call me. dizziness, drowsiness, spinning sensation; nausea, vomiting, loss of appetite; feeling irritable; blurred vision; mild skin rash. Started 25 mg daily at bedtime and increased to . she is not having any side effects but she is also not seeing any improvement. We'll increase slowly to 100 mg and then can further increase from there. I asked daughter to call me after she has been on 100 mg for 2-3 weeks.   Lisa Dean, MD  [facility] [address] [place], [place] [address]  Phone [phone] Fax [phone]  A total of 30 minutes was spent face-to-face with this patient. Over half this time was spent on counseling patient on the essential tremor diagnosis and different diagnostic and therapeutic options available.

## 2015-06-12 NOTE — Patient Instructions (Signed)
Remember to drink plenty of fluid, eat healthy meals and do not skip any meals. Try to eat protein with a every meal and eat a healthy snack such as fruit or nuts in between meals. Try to keep a regular sleep-wake schedule and try to exercise daily, particularly in the form of walking, 20-30 minutes a day, if you can.   As far as your medications are concerned, I would like to suggest  As far as diagnostic testing: Increase Primidone (Mysoline). Can increase by 1/2 pill every 2 weeks until taking 100mg  at night. Please email me after 2-3 weeks and tell me how she is doing. If tolerating well and still has tremor, we can increase again.   I would like to see you back in 4 months, sooner if we need to. Please call us with any interim questions, concerns, problems, updates or refill requests.   Our phone number is [phone]. We also have an after hours call service for urgent matters and there is a physician on-call for urgent questions. For any emergencies you know to call 911 or go to the nearest emergency room

## 2015-06-13 DIAGNOSIS — R7301 Impaired fasting glucose: Secondary | ICD-10-CM | POA: Diagnosis not present

## 2015-06-13 DIAGNOSIS — I10 Essential (primary) hypertension: Secondary | ICD-10-CM | POA: Diagnosis not present

## 2015-06-13 DIAGNOSIS — E782 Mixed hyperlipidemia: Secondary | ICD-10-CM | POA: Diagnosis not present

## 2015-06-17 DIAGNOSIS — I10 Essential (primary) hypertension: Secondary | ICD-10-CM | POA: Diagnosis not present

## 2015-06-17 DIAGNOSIS — D649 Anemia, unspecified: Secondary | ICD-10-CM | POA: Diagnosis not present

## 2015-06-17 DIAGNOSIS — R7301 Impaired fasting glucose: Secondary | ICD-10-CM | POA: Diagnosis not present

## 2015-06-17 DIAGNOSIS — E782 Mixed hyperlipidemia: Secondary | ICD-10-CM | POA: Diagnosis not present

## 2015-06-17 DIAGNOSIS — E559 Vitamin D deficiency, unspecified: Secondary | ICD-10-CM | POA: Diagnosis not present

## 2015-07-29 ENCOUNTER — Other Ambulatory Visit: Payer: Self-pay | Admitting: Neurology

## 2015-07-29 ENCOUNTER — Encounter: Payer: Self-pay | Admitting: Neurology

## 2015-07-29 MED ORDER — PRIMIDONE 50 MG PO TABS: 150.0000 mg | ORAL_TABLET | Freq: Every day | ORAL | Status: DC

## 2015-09-19 DIAGNOSIS — G5601 Carpal tunnel syndrome, right upper limb: Secondary | ICD-10-CM | POA: Diagnosis not present

## 2015-09-19 DIAGNOSIS — G5602 Carpal tunnel syndrome, left upper limb: Secondary | ICD-10-CM | POA: Diagnosis not present

## 2015-10-09 ENCOUNTER — Ambulatory Visit (INDEPENDENT_AMBULATORY_CARE_PROVIDER_SITE_OTHER): Payer: Medicare Other | Admitting: Neurology

## 2015-10-09 ENCOUNTER — Encounter: Payer: Self-pay | Admitting: Neurology

## 2015-10-09 VITALS — BP 145/70 | HR 80 | Ht 61.0 in | Wt 137.4 lb

## 2015-10-09 DIAGNOSIS — G250 Essential tremor: Secondary | ICD-10-CM | POA: Diagnosis not present

## 2015-10-09 DIAGNOSIS — D509 Iron deficiency anemia, unspecified: Secondary | ICD-10-CM | POA: Diagnosis not present

## 2015-10-09 DIAGNOSIS — H6121 Impacted cerumen, right ear: Secondary | ICD-10-CM | POA: Diagnosis not present

## 2015-10-09 MED ORDER — PROPRANOLOL HCL 10 MG PO TABS: 10.0000 mg | ORAL_TABLET | Freq: Two times a day (BID) | ORAL | 6 refills | Status: DC

## 2015-10-09 NOTE — Patient Instructions (Addendum)
Remember to drink plenty of fluid, eat healthy meals and do not skip any meals. Try to eat protein with a every meal and eat a healthy snack such as fruit or nuts in between meals. Try to keep a regular sleep-wake schedule and try to exercise daily, particularly in the form of walking, 20-30 minutes a day, if you can.   As far as your medications are concerned, I would like to suggest: Start propranolol once a day and in 2 weeks can increase  As far as diagnostic testing: Labs  I would like to see you back in 4-6 months, sooner if we need to. Please call us with any interim questions, concerns, problems, updates or refill requests.   Our phone number is [phone]. We also have an after hours call service for urgent matters and there is a physician on-call for urgent questions. For any emergencies you know to call 911 or go to the nearest emergency room  Pramipexole tablets What is this medicine? PRAMIPEXOLE (pra mi PEX ole) is used to treat symptoms of Parkinson's disease. It is also used to treat Restless Legs Syndrome. This medicine may be used for other purposes; ask your health care provider or pharmacist if you have questions. What should I tell my health care provider before I take this medicine? They need to know if you have any of these conditions: -dizzy or fainting spells -heart disease -kidney disease -low blood pressure -schizophrenia -sleeping problems -an unusual or allergic reaction to pramipexole, other medicines, foods, dyes, or preservatives -pregnant or trying to get pregnant -breast-feeding How should I use this medicine? Take this medicine by mouth with a glass of water. Follow the directions on the prescription label. Take with food. Take your doses at regular intervals. Do not take your medicine more often than directed. Do not stop taking except on the advice of your doctor or health care professional. Talk to your pediatrician regarding the use of this medicine  in children. Special care may be needed. Overdosage: If you think you have taken too much of this medicine contact a poison control center or emergency room at once. NOTE: This medicine is only for you. Do not share this medicine with others. What if I miss a dose? If you miss a dose, take it as soon as you can. If it is almost time for your next dose, take only that dose. Do not take double or extra doses. What may interact with this medicine? -amantadine -cimetidine -diltiazem -medicines for mental problems or psychotic disturbances -medicines for sleep -metoclopramide -quinidine or quinine -ranitidine -triamterene -verapamil This list may not describe all possible interactions. Give your health care provider a list of all the medicines, herbs, non-prescription drugs, or dietary supplements you use. Also tell them if you smoke, drink alcohol, or use illegal drugs. Some items may interact with your medicine. What should I watch for while using this medicine? Visit your doctor or health care professional for regular checks on your progress. It may be several weeks or months before you feel the full effect of this medicine. Continue to take your medicine on a regular schedule. You may get drowsy or dizzy. Do not drive, use machinery, or do anything that needs mental alertness until you know how this drug affects you. Do not stand or sit up quickly, especially if you are an older patient. This reduces the risk of dizzy or fainting spells. If you find that you have sudden feelings of wanting to sleep during normal  activities, like cooking, watching television, or while driving or riding in a car, you should contact your health care professional. Your mouth may get dry. Chewing sugarless gum or sucking hard candy, and drinking plenty of water may help. Contact your doctor if the problem does not go away or is severe. There have been reports of increased sexual urges or other strong urges such as  gambling while taking some medicines for Parkinson's disease. If you experience any of these urges while taking this medicine, you should report it to your health care provider as soon as possible. You should check your skin often for changes to moles and new growths while taking this medicine. Call your doctor if you notice any of these changes. What side effects may I notice from receiving this medicine? Side effects that you should report to your doctor or health care professional as soon as possible: -confusion -double vision or other vision problems -fainting spells -falling asleep during normal activities like driving -hallucination, loss of contact with reality -mental changes -muscle pain or severe muscle weakness -uncontrollable movements of the arms, face, hands, head, mouth, shoulders, or upper body Side effects that usually do not require medical attention (report to your doctor or health care professional if they continue or are bothersome): -constipation -frequent urination -mild weakness -nausea This list may not describe all possible side effects. Call your doctor for medical advice about side effects. You may report side effects to FDA at 1-800-FDA-1088. Where should I keep my medicine? Keep out of the reach of children. Store at room temperature between 15 and 30 degrees C (59 and 86 degrees F). Protect from light. Throw away any unused medicine after the expiration date. NOTE: This sheet is a summary. It may not cover all possible information. If you have questions about this medicine, talk to your doctor, pharmacist, or health care provider.    2016, Elsevier/Gold Standard. (2013-07-09 15:53:08)   Propranolol tablets What is this medicine? PROPRANOLOL (proe PRAN oh lole) is a beta-blocker. Beta-blockers reduce the workload on the heart and help it to beat more regularly. This medicine is used to treat high blood pressure, to control irregular heart rhythms  (arrhythmias) and to relieve chest pain caused by angina. It may also be helpful after a heart attack. This medicine is also used to prevent migraine headaches, relieve uncontrollable shaking (tremors), and help certain problems related to the thyroid gland and adrenal gland. This medicine may be used for other purposes; ask your health care provider or pharmacist if you have questions. What should I tell my health care provider before I take this medicine? They need to know if you have any of these conditions: -circulation problems or blood vessel disease -diabetes -history of heart attack or heart disease, vasospastic angina -kidney disease -liver disease -lung or breathing disease, like asthma or emphysema -pheochromocytoma -slow heart rate -thyroid disease -an unusual or allergic reaction to propranolol, other beta-blockers, medicines, foods, dyes, or preservatives -pregnant or trying to get pregnant -breast-feeding How should I use this medicine? Take this medicine by mouth with a glass of water. Follow the directions on the prescription label. Take your doses at regular intervals. Do not take your medicine more often than directed. Do not stop taking except on your the advice of your doctor or health care professional. Talk to your pediatrician regarding the use of this medicine in children. Special care may be needed. Overdosage: If you think you have taken too much of this medicine contact a  poison control center or emergency room at once. NOTE: This medicine is only for you. Do not share this medicine with others. What if I miss a dose? If you miss a dose, take it as soon as you can. If it is almost time for your next dose, take only that dose. Do not take double or extra doses. What may interact with this medicine? Do not take this medicine with any of the following medications: -feverfew -phenothiazines like chlorpromazine, mesoridazine, prochlorperazine, thioridazine This  medicine may also interact with the following medications: -aluminum hydroxide gel -antipyrine -antiviral medicines for HIV or AIDS -barbiturates like phenobarbital -certain medicines for blood pressure, heart disease, irregular heart beat -cimetidine -ciprofloxacin -diazepam -fluconazole -haloperidol -isoniazid -medicines for cholesterol like cholestyramine or colestipol -medicines for mental depression -medicines for migraine headache like almotriptan, eletriptan, frovatriptan, naratriptan, rizatriptan, sumatriptan, zolmitriptan -NSAIDs, medicines for pain and inflammation, like ibuprofen or naproxen -phenytoin -rifampin -teniposide -theophylline -thyroid medicines -tolbutamide -warfarin -zileuton This list may not describe all possible interactions. Give your health care provider a list of all the medicines, herbs, non-prescription drugs, or dietary supplements you use. Also tell them if you smoke, drink alcohol, or use illegal drugs. Some items may interact with your medicine. What should I watch for while using this medicine? Visit your doctor or health care professional for regular check ups. Check your blood pressure and pulse rate regularly. Ask your health care professional what your blood pressure and pulse rate should be, and when you should contact them. You may get drowsy or dizzy. Do not drive, use machinery, or do anything that needs mental alertness until you know how this drug affects you. Do not stand or sit up quickly, especially if you are an older patient. This reduces the risk of dizzy or fainting spells. Alcohol can make you more drowsy and dizzy. Avoid alcoholic drinks. This medicine can affect blood sugar levels. If you have diabetes, check with your doctor or health care professional before you change your diet or the dose of your diabetic medicine. Do not treat yourself for coughs, colds, or pain while you are taking this medicine without asking your doctor or  health care professional for advice. Some ingredients may increase your blood pressure. What side effects may I notice from receiving this medicine? Side effects that you should report to your doctor or health care professional as soon as possible: -allergic reactions like skin rash, itching or hives, swelling of the face, lips, or tongue -breathing problems -changes in blood sugar -cold hands or feet -difficulty sleeping, nightmares -dry peeling skin -hallucinations -muscle cramps or weakness -slow heart rate -swelling of the legs and ankles -vomiting Side effects that usually do not require medical attention (report to your doctor or health care professional if they continue or are bothersome): -change in sex drive or performance -diarrhea -dry sore eyes -hair loss -nausea -weak or tired This list may not describe all possible side effects. Call your doctor for medical advice about side effects. You may report side effects to FDA at 1-800-FDA-1088. Where should I keep my medicine? Keep out of the reach of children. Store at room temperature between 15 and 30 degrees C (59 and 86 degrees F). Protect from light. Throw away any unused medicine after the expiration date. NOTE: This sheet is a summary. It may not cover all possible information. If you have questions about this medicine, talk to your doctor, pharmacist, or health care provider.    2016, Elsevier/Gold Standard. (2012-10-27 14:51:53)

## 2015-10-09 NOTE — Progress Notes (Signed)
[name]    Provider:  Dr Lisa [name] Referring Provider: [name], MD Primary Care Physician:  Lisa Alken, MD  CC: tremor  Interval history 10/09/2015: She is on 3 pills of primidone at night. The hand tremor is better.  The chin tremor is still bad. Some sedation and feeling sluggish likely die to the primidone, discussed, cannot increase here. Discussed other medications and possibly a very low dose b-blocker. She is sleeping later in the morning. She goes to be at 11pm and 1130. She takes the pills at 10pm. She wakes up at 830. She feels sluggish. Will ask her to take primidone a little earlier. Cannot increase at this time. Discussed other options, decided on propranolol very low dose and discussed side effects at length. Continue primidone at current dose.    Interval history: She is eating more and better. She is on more of a routine at the nursing home. She is on a whole pill of primidone at night. She does not feel that the primidone is helping her however she is only on 50 mg at night. Discussed that we may have to increase this medication more than not to see results. Propranolol the past helped but was discontinued due to side effects. Discussed with daughter and patient again. She denies any side effects at all to the primidone. Let daughter know that she doesn't have to wait several months if the medication is not working, they can call me and I'm happy to increase it. We'll plan on increasing the primidone very slowly to 100 mg at night. They understand that we may have to go up much more than that and that this may be limited by side effects. Can cause sedation in the middle of the night, advised patient to keep lights on, and if she has any side effects or any increased sedation that she should go back to the last dose of the primidone that was tolerated. Discussed other medications we could try including Neurontin, Keppra, Topamax for essential  tremor. Propranolol and primidone are the most effective however Neurontin often works as well.  HPI: Lisa [name] is a 82 y.o. female here as a referral from Dr. Zachery [name] for tremor. PMHx of CAP, asymptomatic carotid artery disease, Chronic venous insufficiency, Acute resp failure with hypoxia, HTN, dizziness, glucose intolerance, osteoporosis, benign essential tremor, anemia, allergic rhinitis,, HLD, glaucoma. She has had a tremor for years. Started 10 years ago in the right hand and then to the left hand then to the face. Worsening over the last year. No pain just very annoying. Continuous. It is embarrassing. She was on a b-blocker and that was discontinued. She was dizzy on the b-blocker with hypotension. The beta blocker help the tremor, Worsened since stopping the b-blocker. The tremor is continuous. Her mohter had a tremor as well as her brother. Slowly progressive. Daughter is with her and also provides information.  Reviewed notes, labs and imaging from outside physicians, which showed:  Cbc with anemia (9.5/28.1), BMP unremarkable, B12 745, TSH 3.120  MRI of the brain 2005: MRI BRAIN WO/W CONTRAST 15 cc IV Omniscan. No comparison. There is moderate cortical atrophy. There are scattered small subcortical and periventricular white matter hyperintensities as well as a 1 cm hyperintensity in the left inferior temporal lobe. 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 There also is some minimal patchy hyperintensity in the pons bilaterally compatible with chronic ischemic change. Diffusion weighted imaging is negative for acute infarct. The enhancement pattern is normal and  there is no mass. IMPRESSION Chronic small vessel ischemic changes. No acute abnormalities  Reviewed records from [facility] physicians. She also has dyslipidemia and impaired fasting glucose. Patient (solving [place]. Spent a good move for her. She enjoys activities in the company. She the dining hall twice a day is  probably eating better but is also exercising more. She has a history of benign essential tremor primarily in her jaw. She was on a beta blocker without discontinued due to low blood pressure and becoming dizzy. He frequently clears her throat has a lot of sneezing or blowing her nose.   Review of Systems: Patient complains of symptoms per HPI as well as the following symptoms: Tremor, hearing loss. Pertinent negatives per HPI. All others negative.   Social History   Social History  . Marital status: Widowed    Spouse name: N/A  . Number of children: 3  . Years of education: 38   Occupational History  . n/a    Social History Main Topics  . Smoking status: Never Smoker  . Smokeless tobacco: Never Used  . Alcohol use 0.0 oz/week     Comment: rare  . Drug use: No  . Sexual activity: Not on file   Other Topics Concern  . Not on file   Social History Narrative   Lives alone   Caffeine use: none    Family History  Problem Relation Age of Onset  . Hypertension Mother   . Heart disease Mother   . Hyperlipidemia Brother   . Hypertension Brother     Past Medical History:  Diagnosis Date  . Arthritis   . Carotid artery occlusion   . Cataract   . Glaucoma   . High cholesterol   . Hypertension   . Osteoporosis   . Pneumonia July 03, 2014    Past Surgical History:  Procedure Laterality Date  . ABDOMINAL HYSTERECTOMY  1969  . APPENDECTOMY    . EYE SURGERY      Current Outpatient Prescriptions  Medication Sig Dispense Refill  . aspirin EC 81 MG tablet Take 81 mg by mouth daily.    . calcium carbonate (OS-CAL) 600 MG TABS Take 600 mg by mouth 2 (two) times daily with a meal.    . diptheria-tetanus toxoids (DECAVAC) 2-2 LF/0.5ML injection   0  . Multiple Vitamin (MULTIVITAMIN) tablet Take 1 tablet by mouth daily.    . primidone (MYSOLINE) 50 MG tablet Take 3 tablets (150 mg total) by mouth at bedtime. 90 tablet 6  . raloxifene (EVISTA) 60 MG tablet Take 60 mg by  mouth at bedtime.     . timolol (TIMOPTIC) 0.5 % ophthalmic solution   1  . VYTORIN 10-20 MG tablet Take 1 tablet by mouth daily.  0  . propranolol (INDERAL) 10 MG tablet Take 1 tablet (10 mg total) by mouth 2 (two) times daily. 60 tablet 6   No current facility-administered medications for this visit.     Allergies as of 10/09/2015 - Review Complete 10/09/2015  Allergen Reaction Noted  . Mycostatin [nystatin] Swelling and Rash 11/28/2011    Vitals: BP (!) 145/70 (BP Location: Right Arm, Patient Position: Sitting, Cuff Size: Normal)   Pulse 80   Ht  (1.549 m)   Wt 137 lb 6.4 oz (62.3 kg)   SpO2 95%   BMI 25.96 kg/m  Last Weight:  Wt Readings from Last 1 Encounters:  10/09/15 137 lb 6.4 oz (62.3 kg)   Last Height:   Ht  Readings from Last 1 Encounters:  10/09/15 5\' 1"  (1.549 m)    Cognition:  The patient is oriented to person, place, and time;   recent and remote memory intact;   language fluent;   normal attention, concentration,   fund of knowledge Cranial Nerves:  The pupils are equal, round, and reactive to light. Visual fields are full to finger confrontation. Extraocular movements are intact. Trigeminal sensation is intact and the muscles of mastication are normal. The face is symmetric. The palate elevates in the midline. Hearing impaired. Voice is normal. Shoulder shrug is normal. The tongue has normal motion without fasciculations.    Motor Observation:  High frequency, low amplitude upper extremity postural, also a voice and chin tremor Tone:  Normal muscle tone.   Posture:  Mildly stooped   Strength:  Strength is V/V in the upper and lower limbs.    Sensation: intact to LT   Reflex Exam:  DTR's: absent AJs  Toes:  The toes are equivocal bilaterally.  Clonus:  Clonus is absent.      Assessment/Plan: Patient is a lovely 82 year old female here with familial essential tremor.   - Discussed  beta blockers in the past, appears she was on a lot of  Blood pressure medications at the time and dizziness and hypotension may not have been solely due to metoprolol. Her BP and pulse today are not low, she is not on any BP meds, she may be able to tolerate low-dose propranolol and slowly increase. Discussed risks especially bradycardia, hypotension, CP, cardiac arrythmias which could cause death if severe. We will start very low, propranolol 10mg  once dialy then twice daily. Discussed all side effects as per patient instrictions. Combined with primidone it may help, cannot increase primidone any further.    Lisa Dean, MD  [facility] [address] [place], [place] [address]  Phone [phone] Fax [phone]  A total of 30 minutes was spent face-to-face with this patient. Over half this time was spent on counseling patient on the essential tremor diagnosis and different diagnostic and therapeutic options available.

## 2015-10-10 ENCOUNTER — Telehealth: Payer: Self-pay | Admitting: *Deleted

## 2015-10-10 LAB — BASIC METABOLIC PANEL
BUN/Creatinine Ratio: 28 (ref 12–28)
BUN: 24 mg/dL (ref 8–27)
CO2: 27 mmol/L (ref 18–29)
Calcium: 9 mg/dL (ref 8.7–10.3)
Chloride: 98 mmol/L (ref 96–106)
Creatinine, Ser: 0.86 mg/dL (ref 0.57–1.00)
GFR calc Af Amer: 69 mL/min/{1.73_m2} (ref >59)
GFR calc non Af Amer: 60 mL/min/{1.73_m2} (ref >59)
Glucose: 109 mg/dL — ABNORMAL HIGH (ref 65–99)
Potassium: 5.5 mmol/L — ABNORMAL HIGH (ref 3.5–5.2)
Sodium: 139 mmol/L (ref 134–144)

## 2015-10-10 LAB — IRON AND TIBC
Iron Saturation: 34 % (ref 15–55)
Iron: 92 ug/dL (ref 27–139)
Total Iron Binding Capacity: 268 ug/dL (ref 250–450)
UIBC: 176 ug/dL (ref 118–369)

## 2015-10-10 LAB — FERRITIN: Ferritin: 36 ng/mL (ref 15–150)

## 2015-10-10 NOTE — Telephone Encounter (Signed)
-----   Message from Anson Fret, MD sent at 10/10/2015 10:28 AM EDT ----- Please call daughter. Her iron level is exceedingly low, 36, this is likely contributing to her restless legs. I would like to see this value more than doubled. We can provide IV iron infusion which is quicker than oral. She can take iron 325mg  twice daily in a pill form as well. thanks

## 2015-10-10 NOTE — Telephone Encounter (Signed)
Called and spoke to daughter about lab results per Dr Lucia Gaskins. She is going to proceed with getting 325mg  tab iron for pt. She declined IV infusion at this time. I went over foods high in iron.  I advised I will let Dr Lucia Gaskins know. She verbalized understanding.

## 2015-10-12 DIAGNOSIS — G250 Essential tremor: Secondary | ICD-10-CM | POA: Insufficient documentation

## 2015-10-21 DIAGNOSIS — G5602 Carpal tunnel syndrome, left upper limb: Secondary | ICD-10-CM | POA: Diagnosis not present

## 2015-10-21 DIAGNOSIS — G5601 Carpal tunnel syndrome, right upper limb: Secondary | ICD-10-CM | POA: Diagnosis not present

## 2015-10-29 DIAGNOSIS — L918 Other hypertrophic disorders of the skin: Secondary | ICD-10-CM | POA: Diagnosis not present

## 2015-10-29 DIAGNOSIS — D2271 Melanocytic nevi of right lower limb, including hip: Secondary | ICD-10-CM | POA: Diagnosis not present

## 2015-10-29 DIAGNOSIS — L814 Other melanin hyperpigmentation: Secondary | ICD-10-CM | POA: Diagnosis not present

## 2015-10-29 DIAGNOSIS — L821 Other seborrheic keratosis: Secondary | ICD-10-CM | POA: Diagnosis not present

## 2015-10-29 DIAGNOSIS — L570 Actinic keratosis: Secondary | ICD-10-CM | POA: Diagnosis not present

## 2015-10-29 DIAGNOSIS — D225 Melanocytic nevi of trunk: Secondary | ICD-10-CM | POA: Diagnosis not present

## 2015-10-29 DIAGNOSIS — D2371 Other benign neoplasm of skin of right lower limb, including hip: Secondary | ICD-10-CM | POA: Diagnosis not present

## 2015-10-29 DIAGNOSIS — D1801 Hemangioma of skin and subcutaneous tissue: Secondary | ICD-10-CM | POA: Diagnosis not present

## 2015-10-29 DIAGNOSIS — D2272 Melanocytic nevi of left lower limb, including hip: Secondary | ICD-10-CM | POA: Diagnosis not present

## 2015-11-11 DIAGNOSIS — E559 Vitamin D deficiency, unspecified: Secondary | ICD-10-CM | POA: Diagnosis not present

## 2015-11-11 DIAGNOSIS — E782 Mixed hyperlipidemia: Secondary | ICD-10-CM | POA: Diagnosis not present

## 2015-11-11 DIAGNOSIS — D649 Anemia, unspecified: Secondary | ICD-10-CM | POA: Diagnosis not present

## 2015-11-11 DIAGNOSIS — R7301 Impaired fasting glucose: Secondary | ICD-10-CM | POA: Diagnosis not present

## 2015-11-13 DIAGNOSIS — E782 Mixed hyperlipidemia: Secondary | ICD-10-CM | POA: Diagnosis not present

## 2015-11-13 DIAGNOSIS — I779 Disorder of arteries and arterioles, unspecified: Secondary | ICD-10-CM | POA: Diagnosis not present

## 2015-11-13 DIAGNOSIS — R7301 Impaired fasting glucose: Secondary | ICD-10-CM | POA: Diagnosis not present

## 2015-11-13 DIAGNOSIS — E559 Vitamin D deficiency, unspecified: Secondary | ICD-10-CM | POA: Diagnosis not present

## 2015-11-13 DIAGNOSIS — I10 Essential (primary) hypertension: Secondary | ICD-10-CM | POA: Diagnosis not present

## 2015-11-13 DIAGNOSIS — D509 Iron deficiency anemia, unspecified: Secondary | ICD-10-CM | POA: Diagnosis not present

## 2015-11-13 DIAGNOSIS — G250 Essential tremor: Secondary | ICD-10-CM | POA: Diagnosis not present

## 2015-11-13 DIAGNOSIS — L989 Disorder of the skin and subcutaneous tissue, unspecified: Secondary | ICD-10-CM | POA: Diagnosis not present

## 2015-11-13 DIAGNOSIS — Z23 Encounter for immunization: Secondary | ICD-10-CM | POA: Diagnosis not present

## 2015-11-18 ENCOUNTER — Telehealth: Payer: Self-pay | Admitting: Neurology

## 2015-11-18 ENCOUNTER — Other Ambulatory Visit: Payer: Self-pay | Admitting: Neurology

## 2015-11-18 MED ORDER — PROPRANOLOL HCL 10 MG PO TABS: 10.0000 mg | ORAL_TABLET | Freq: Two times a day (BID) | ORAL | 6 refills | Status: DC

## 2015-11-18 NOTE — Telephone Encounter (Addendum)
Daughter Lisa [name] called regarding propranolol (INDERAL) 10 MG tablet, needs to be refilled later this week, with increase, Dr. Lucia GaskinsAhern started off on low dosage until she gets acclimated to it, 1 a day for week or so then 2 a day, would then gradually increase. Daughter advised DPR needs to be signed.

## 2015-11-18 NOTE — Telephone Encounter (Signed)
I spoke to pt's daughter, Lisa [name] (emergency contact). I advised her that Dr. Lucia GaskinsAhern refilled the propranolol for 10mg  BID. Pt's daughter says that this is dosage that she has been on, but that Dr. Lucia GaskinsAhern mentioned that she would increase it to 2 tablets BID if pt was doing well.  Pt's daughter wants to verify that Dr. Lucia GaskinsAhern does NOT want to increase it at this time (based on what Dr. Lucia GaskinsAhern refilled, the dose did not change).  If Dr. Lucia GaskinsAhern does want to increase the propranolol, pt's daughter is suggesting that it maybe it should be done slowly, such as 15 mg BID, instead of 20 mg BID.  Pt's daughter says that she will be unavailable before 1pm tomorrow and needs a call back after 1 pm.

## 2015-11-18 NOTE — Telephone Encounter (Signed)
I called it in again, not sure why they did not get it last month. Sorry, thanks

## 2015-11-18 NOTE — Telephone Encounter (Signed)
Dr Ahern- please advise 

## 2015-11-19 ENCOUNTER — Other Ambulatory Visit: Payer: Self-pay | Admitting: Neurology

## 2015-11-19 MED ORDER — PROPRANOLOL HCL 10 MG PO TABS: 20.0000 mg | ORAL_TABLET | Freq: Two times a day (BID) | ORAL | 6 refills | Status: DC

## 2015-11-19 NOTE — Telephone Encounter (Signed)
I spoke to pt's daughter, Britta MccreedyBarbara and advised her of Dr. Trevor MaceAhern's message. Pt's daughter says that she and the pt would prefer to start out doing 1 pill three times daily of propranolol 10mg  for now and see how she does. Pt's daughter verbalized understanding.

## 2015-11-19 NOTE — Telephone Encounter (Signed)
If she is doing well on 10mg  twice daily we can increase to 20mg  twice daily. I ordered enough for 20mg  twice daily. She can break the pills in half and do 15mg  twice daily or alternatively 1 pill three times a day if they want to go slower thanks.  I ordered it thanks!

## 2015-11-20 DIAGNOSIS — H401122 Primary open-angle glaucoma, left eye, moderate stage: Secondary | ICD-10-CM | POA: Diagnosis not present

## 2015-11-20 DIAGNOSIS — Z961 Presence of intraocular lens: Secondary | ICD-10-CM | POA: Diagnosis not present

## 2015-11-20 DIAGNOSIS — H401111 Primary open-angle glaucoma, right eye, mild stage: Secondary | ICD-10-CM | POA: Diagnosis not present

## 2015-11-28 DIAGNOSIS — Z1211 Encounter for screening for malignant neoplasm of colon: Secondary | ICD-10-CM | POA: Diagnosis not present

## 2015-12-30 DIAGNOSIS — Z1231 Encounter for screening mammogram for malignant neoplasm of breast: Secondary | ICD-10-CM | POA: Diagnosis not present

## 2015-12-31 ENCOUNTER — Ambulatory Visit: Payer: Medicare Other | Admitting: Family

## 2015-12-31 ENCOUNTER — Encounter (HOSPITAL_COMMUNITY): Payer: Medicare Other

## 2016-02-04 ENCOUNTER — Encounter: Payer: Self-pay | Admitting: Family

## 2016-02-11 ENCOUNTER — Ambulatory Visit (HOSPITAL_COMMUNITY)
Admission: RE | Admit: 2016-02-11 | Discharge: 2016-02-11 | Disposition: A | Discharge status: Home / Self Care | Payer: Medicare Other | Source: Ambulatory Visit | Attending: Family | Admitting: Family

## 2016-02-11 ENCOUNTER — Encounter: Payer: Self-pay | Admitting: Family

## 2016-02-11 ENCOUNTER — Ambulatory Visit (INDEPENDENT_AMBULATORY_CARE_PROVIDER_SITE_OTHER): Payer: Medicare Other | Admitting: Family

## 2016-02-11 VITALS — BP 134/65 | HR 90 | Temp 99.2°F | Ht 61.0 in | Wt 136.9 lb

## 2016-02-11 DIAGNOSIS — I6523 Occlusion and stenosis of bilateral carotid arteries: Secondary | ICD-10-CM | POA: Insufficient documentation

## 2016-02-11 DIAGNOSIS — I872 Venous insufficiency (chronic) (peripheral): Secondary | ICD-10-CM

## 2016-02-11 LAB — VAS US CAROTID
LEFT ECA DIAS: -6 cm/s
Left CCA dist dias: -17 cm/s
Left CCA dist sys: -79 cm/s
Left CCA prox dias: 14 cm/s
Left CCA prox sys: 84 cm/s
Left ICA dist dias: -21 cm/s
Left ICA dist sys: -100 cm/s
Left ICA prox dias: 18 cm/s
Left ICA prox sys: 73 cm/s
RIGHT CCA MID DIAS: 11 cm/s
RIGHT ECA DIAS: -8 cm/s
Right CCA prox dias: 19 cm/s
Right CCA prox sys: 101 cm/s
Right cca dist sys: -116 cm/s

## 2016-02-11 NOTE — Progress Notes (Signed)
Chief Complaint: Follow up Extracranial Carotid Artery Stenosis   History of Present Illness  Lisa Canardancy O Verley is a 82 y.o. female patient of Dr. Imogene Burnhen who presents with chief complaint: abnormal carotid studies. Previous carotid studies demonstrated: RICA <50% stenosis, LICA <50% stenosis. Pt had B carotid duplex completed due to R carotid bruit. Patient has no history of TIA or stroke symptom. The patient has never had amaurosis fugax or monocular blindness. The patient has never had unilateral facial drooping or hemiplegia. The patient has never had receptive or expressive aphasia. The patient's risks factors for carotid disease include: HTN, HLD.   [facility] with pneumonia.Since this she has not needed blood pressure medication.   Pt Diabetic: no Pt smoker: non-smoker  Pt meds include: Statin : yes ASA: yes Other anticoagulants/antiplatelets: no   Past Medical History:  Diagnosis Date  . Arthritis   . Carotid artery occlusion   . Cataract   . Glaucoma   . High cholesterol   . Hypertension   . Osteoporosis   . Pneumonia July 03, 2014    Social History Social History  Substance Use Topics  . Smoking status: Never Smoker  . Smokeless tobacco: Never Used  . Alcohol use 0.0 oz/week     Comment: rare    Family History Family History  Problem Relation Age of Onset  . Hypertension Mother   . Heart disease Mother   . Hyperlipidemia Brother   . Hypertension Brother     Surgical History Past Surgical History:  Procedure Laterality Date  . ABDOMINAL HYSTERECTOMY  1969  . APPENDECTOMY    . EYE SURGERY      Allergies  Allergen Reactions  . Mycostatin [Nystatin] Swelling and Rash    Current Outpatient Prescriptions  Medication Sig Dispense Refill  . aspirin EC 81 MG tablet Take 81 mg by mouth daily.    . calcium carbonate (OS-CAL) 600 MG TABS Take 600 mg by mouth 2 (two) times daily with a meal.    .  diptheria-tetanus toxoids (DECAVAC) 2-2 LF/0.5ML injection   0  . Multiple Vitamin (MULTIVITAMIN) tablet Take 1 tablet by mouth daily.    . primidone (MYSOLINE) 50 MG tablet Take 3 tablets (150 mg total) by mouth at bedtime. 90 tablet 6  . propranolol (INDERAL) 10 MG tablet Take 2 tablets (20 mg total) by mouth 2 (two) times daily. (Patient taking differently: Take 10 mg by mouth 3 (three) times daily. ) 120 tablet 6  . raloxifene (EVISTA) 60 MG tablet Take 60 mg by mouth at bedtime.     . timolol (TIMOPTIC) 0.5 % ophthalmic solution   1  . VYTORIN 10-20 MG tablet Take 1 tablet by mouth daily.  0   No current facility-administered medications for this visit.     Review of Systems : See HPI for pertinent positives and negatives.  Physical Examination  Vitals:   02/11/16 1507 02/11/16 1509  BP: 132/66 134/65  Pulse: 91 90  Temp: 99.2 F (37.3 C)   TempSrc: Oral   SpO2: 96%   Weight: 136 lb 14.4 oz (62.1 kg)   Height: 5\' 1"  (1.549 m)    Body mass index is 25.87 kg/m.  General:A&O x 3, WDWN  Head: [place]/AT  Eyes: PERRLA, Post surg chg to lenses  Pulmonary: Sym exp, respirations are non labored, good air movement in all fields, CTAB, no rales, rhonchi, or wheezing  Cardiac: RRR, Nl S1, S2,  no detected murmur  Vascular: Vessel Right Left  Radial Faintly Palpable Faintly Palpable  Brachial Palpable Palpable  Carotid Palpable, without bruit Palpable, without bruit  Aorta Not palpable N/A  Popliteal Not palpable Not palpable  PT Not Palpable Not Palpable  DP Faintly Palpable Faintly Palpable   Gastrointestinal: soft, NTND, -G/R, - HSM, - palpable masses, - CVAT B  Musculoskeletal: M/S 5/5 throughout , Extremities without ischemic changes,  B varicose veins and spider veins, no LDS  Neurologic: CN 2-12 intact except for significant hearing loss, pain and light touch intact in extremities , motor exam as listed above. + Essential tremor of  mouth mandible.  Psychiatric: Judgment intact, mood & affect appropriate for pt's clinical situation. Loquacious.   Dermatologic: See M/S exam for extremity exam, no rashes otherwise noted     Assessment: Lisa [name] is a 82 y.o. female who has no history of stroke or TIA.  DATA Today's carotid duplex suggests <40% stenosis of the bilateral internal carotid arteries.  No significant stenosis of the bilateral ECA or CCA. Vertebral arteries are antegrade. Bilateral subclavian artery waveforms are normal. No significant change from last carotid studies on 12-27-14.   Plan: Follow-up in 24 months with Carotid Duplex scan, or as needed.   I discussed in depth with the patient the nature of atherosclerosis, and emphasized the importance of maximal medical management including strict control of blood pressure, blood glucose, and lipid levels, obtaining regular exercise, and continued cessation of smoking.  The patient is aware that without maximal medical management the underlying atherosclerotic disease process will progress, limiting the benefit of any interventions. The patient was given information about stroke prevention and what symptoms should prompt the patient to seek immediate medical care. Thank you for allowing us to participate in this patient's care.  Charisse MarchSuzanne Roque Schill, RN, MSN, FNP-C [facility] Office: [phone]  Clinic Physician: Edilia BoDickson  02/11/16 3:35 PM

## 2016-02-11 NOTE — Patient Instructions (Signed)
Stroke Prevention Some medical conditions and behaviors are associated with an increased chance of having a stroke. You may prevent a stroke by making healthy choices and managing medical conditions. How can I reduce my risk of having a stroke?  Stay physically active. Get at least 30 minutes of activity on most or all days.  Do not smoke. It may also be helpful to avoid exposure to secondhand smoke.  Limit alcohol use. Moderate alcohol use is considered to be:  No more than 2 drinks per day for men.  No more than 1 drink per day for nonpregnant women.  Eat healthy foods. This involves:  Eating 5 or more servings of fruits and vegetables a day.  Making dietary changes that address high blood pressure (hypertension), high cholesterol, diabetes, or obesity.  Manage your cholesterol levels.  Making food choices that are high in fiber and low in saturated fat, trans fat, and cholesterol may control cholesterol levels.  Take any prescribed medicines to control cholesterol as directed by your health care provider.  Manage your diabetes.  Controlling your carbohydrate and sugar intake is recommended to manage diabetes.  Take any prescribed medicines to control diabetes as directed by your health care provider.  Control your hypertension.  Making food choices that are low in salt (sodium), saturated fat, trans fat, and cholesterol is recommended to manage hypertension.  Ask your health care provider if you need treatment to lower your blood pressure. Take any prescribed medicines to control hypertension as directed by your health care provider.  If you are 18-39 years of age, have your blood pressure checked every 3-5 years. If you are 40 years of age or older, have your blood pressure checked every year.  Maintain a healthy weight.  Reducing calorie intake and making food choices that are low in sodium, saturated fat, trans fat, and cholesterol are recommended to manage  weight.  Stop drug abuse.  Avoid taking birth control pills.  Talk to your health care provider about the risks of taking birth control pills if you are over 35 years old, smoke, get migraines, or have ever had a blood clot.  Get evaluated for sleep disorders (sleep apnea).  Talk to your health care provider about getting a sleep evaluation if you snore a lot or have excessive sleepiness.  Take medicines only as directed by your health care provider.  For some people, aspirin or blood thinners (anticoagulants) are helpful in reducing the risk of forming abnormal blood clots that can lead to stroke. If you have the irregular heart rhythm of atrial fibrillation, you should be on a blood thinner unless there is a good reason you cannot take them.  Understand all your medicine instructions.  Make sure that other conditions (such as anemia or atherosclerosis) are addressed. Get help right away if:  You have sudden weakness or numbness of the face, arm, or leg, especially on one side of the body.  Your face or eyelid droops to one side.  You have sudden confusion.  You have trouble speaking (aphasia) or understanding.  You have sudden trouble seeing in one or both eyes.  You have sudden trouble walking.  You have dizziness.  You have a loss of balance or coordination.  You have a sudden, severe headache with no known cause.  You have new chest pain or an irregular heartbeat. Any of these symptoms may represent a serious problem that is an emergency. Do not wait to see if the symptoms will go away.   Get medical help at once. Call your local emergency services (911 in [place] Do not drive yourself to the hospital.  This information is not intended to replace advice given to you by your health care provider. Make sure you discuss any questions you have with your health care provider. Document Released: 04/01/2004 Document Revised: 07/31/2015 Document Reviewed: 08/25/2012 Elsevier  Interactive Patient Education  2017 [employer] Inc[employer]      Venous Stasis or Chronic Venous Insufficiency Chronic venous insufficiency, also called venous stasis, is a condition that affects the veins in the legs. The condition prevents blood from being pumped through these veins effectively. Blood may no longer be pumped effectively from the legs back to the heart. This condition can range from mild to severe. With proper treatment, you should be able to continue with an active life. CAUSES  Chronic venous insufficiency occurs when the vein walls become stretched, weakened, or damaged or when valves within the vein are damaged. Some common causes of this include:  High blood pressure inside the veins (venous hypertension).  Increased blood pressure in the leg veins from long periods of sitting or standing.  A blood clot that blocks blood flow in a vein (deep vein thrombosis).  Inflammation of a superficial vein (phlebitis) that causes a blood clot to form. RISK FACTORS Various things can make you more likely to develop chronic venous insufficiency, including:  Family history of this condition.  Obesity.  Pregnancy.  Sedentary lifestyle.  Smoking.  Jobs requiring long periods of standing or sitting in one place.  Being a certain age. Women in their 7740s and 1050s and men in their 970s are more likely to develop this condition. SIGNS AND SYMPTOMS  Symptoms may include:   Varicose veins.  Skin breakdown or ulcers.  Reddened or discolored skin on the leg.  Brown, smooth, tight, and painful skin just above the ankle, usually on the inside surface (lipodermatosclerosis).  Swelling. DIAGNOSIS  To diagnose this condition, your health care provider will take a medical history and do a physical exam. The following tests may be ordered to confirm the diagnosis:  Duplex ultrasound-A procedure that produces a picture of a blood vessel and nearby organs and also provides information on  blood flow through the blood vessel.  Plethysmography-A procedure that tests blood flow.  A venogram, or venography-A procedure used to look at the veins using X-ray and dye. TREATMENT The goals of treatment are to help you return to an active life and to minimize pain or disability. Treatment will depend on the severity of the condition. Medical procedures may be needed for severe cases. Treatment options may include:   Use of compression stockings. These can help with symptoms and lower the chances of the problem getting worse, but they do not cure the problem.  Sclerotherapy-A procedure involving an injection of a material that "dissolves" the damaged veins. Other veins in the network of blood vessels take over the function of the damaged veins.  Surgery to remove the vein or cut off blood flow through the vein (vein stripping or laser ablation surgery).  Surgery to repair a valve. HOME CARE INSTRUCTIONS   Wear compression stockings as directed by your health care provider.  Only take over-the-counter or prescription medicines for pain, discomfort, or fever as directed by your health care provider.  Follow up with your health care provider as directed. SEEK MEDICAL CARE IF:   You have redness, swelling, or increasing pain in the affected area.  You see a  red streak or line that extends up or down from the affected area.  You have a breakdown or loss of skin in the affected area, even if the breakdown is small.  You have an injury to the affected area. SEEK IMMEDIATE MEDICAL CARE IF:   You have an injury and open wound in the affected area.  Your pain is severe and does not improve with medicine.  You have sudden numbness or weakness in the foot or ankle below the affected area, or you have trouble moving your foot or ankle.  You have a fever or persistent symptoms for more than 2-3 days.  You have a fever and your symptoms suddenly get worse. MAKE SURE YOU:   Understand  these instructions.  Will watch your condition.  Will get help right away if you are not doing well or get worse. This information is not intended to replace advice given to you by your health care provider. Make sure you discuss any questions you have with your health care provider. Document Released: 06/28/2006 Document Revised: 12/13/2012 Document Reviewed: 10/30/2012 Elsevier Interactive Patient Education  2017 [employer]

## 2016-02-12 NOTE — Addendum Note (Signed)
Addended by: Burton ApleyPETTY, Mita Vallo A on: 02/12/2016 10:13 AM   Modules accepted: Orders

## 2016-02-26 DIAGNOSIS — J309 Allergic rhinitis, unspecified: Secondary | ICD-10-CM | POA: Diagnosis not present

## 2016-02-26 DIAGNOSIS — R197 Diarrhea, unspecified: Secondary | ICD-10-CM | POA: Diagnosis not present

## 2016-03-03 DIAGNOSIS — J040 Acute laryngitis: Secondary | ICD-10-CM | POA: Diagnosis not present

## 2016-03-06 ENCOUNTER — Emergency Department (HOSPITAL_BASED_OUTPATIENT_CLINIC_OR_DEPARTMENT_OTHER): Payer: Medicare Other

## 2016-03-06 ENCOUNTER — Encounter (HOSPITAL_BASED_OUTPATIENT_CLINIC_OR_DEPARTMENT_OTHER): Payer: Self-pay | Admitting: Emergency Medicine

## 2016-03-06 ENCOUNTER — Inpatient Hospital Stay (HOSPITAL_BASED_OUTPATIENT_CLINIC_OR_DEPARTMENT_OTHER)
Admission: EM | Admit: 2016-03-06 | Discharge: 2016-03-10 | DRG: 689 | Disposition: A | Discharge status: Skilled Nursing Facility | Payer: Medicare Other | Attending: Internal Medicine | Admitting: Internal Medicine

## 2016-03-06 DIAGNOSIS — Z79899 Other long term (current) drug therapy: Secondary | ICD-10-CM

## 2016-03-06 DIAGNOSIS — E785 Hyperlipidemia, unspecified: Secondary | ICD-10-CM | POA: Diagnosis present

## 2016-03-06 DIAGNOSIS — R531 Weakness: Secondary | ICD-10-CM | POA: Diagnosis not present

## 2016-03-06 DIAGNOSIS — Z90710 Acquired absence of both cervix and uterus: Secondary | ICD-10-CM | POA: Diagnosis not present

## 2016-03-06 DIAGNOSIS — Z8249 Family history of ischemic heart disease and other diseases of the circulatory system: Secondary | ICD-10-CM

## 2016-03-06 DIAGNOSIS — H409 Unspecified glaucoma: Secondary | ICD-10-CM | POA: Diagnosis not present

## 2016-03-06 DIAGNOSIS — G250 Essential tremor: Secondary | ICD-10-CM | POA: Diagnosis not present

## 2016-03-06 DIAGNOSIS — I6529 Occlusion and stenosis of unspecified carotid artery: Secondary | ICD-10-CM | POA: Diagnosis present

## 2016-03-06 DIAGNOSIS — R197 Diarrhea, unspecified: Secondary | ICD-10-CM | POA: Diagnosis not present

## 2016-03-06 DIAGNOSIS — J9601 Acute respiratory failure with hypoxia: Secondary | ICD-10-CM | POA: Diagnosis present

## 2016-03-06 DIAGNOSIS — J209 Acute bronchitis, unspecified: Secondary | ICD-10-CM | POA: Diagnosis not present

## 2016-03-06 DIAGNOSIS — R627 Adult failure to thrive: Secondary | ICD-10-CM | POA: Diagnosis not present

## 2016-03-06 DIAGNOSIS — D649 Anemia, unspecified: Secondary | ICD-10-CM | POA: Diagnosis not present

## 2016-03-06 DIAGNOSIS — Z7982 Long term (current) use of aspirin: Secondary | ICD-10-CM | POA: Diagnosis not present

## 2016-03-06 DIAGNOSIS — Z888 Allergy status to other drugs, medicaments and biological substances status: Secondary | ICD-10-CM

## 2016-03-06 DIAGNOSIS — I739 Peripheral vascular disease, unspecified: Secondary | ICD-10-CM | POA: Diagnosis not present

## 2016-03-06 DIAGNOSIS — M810 Age-related osteoporosis without current pathological fracture: Secondary | ICD-10-CM | POA: Diagnosis not present

## 2016-03-06 DIAGNOSIS — I2510 Atherosclerotic heart disease of native coronary artery without angina pectoris: Secondary | ICD-10-CM | POA: Diagnosis present

## 2016-03-06 DIAGNOSIS — I10 Essential (primary) hypertension: Secondary | ICD-10-CM | POA: Diagnosis not present

## 2016-03-06 DIAGNOSIS — N390 Urinary tract infection, site not specified: Secondary | ICD-10-CM | POA: Diagnosis not present

## 2016-03-06 DIAGNOSIS — R0989 Other specified symptoms and signs involving the circulatory and respiratory systems: Secondary | ICD-10-CM | POA: Diagnosis not present

## 2016-03-06 LAB — CBC WITH DIFFERENTIAL/PLATELET
Band Neutrophils: 0 %
Basophils Absolute: 0 10*3/uL (ref 0.0–0.1)
Basophils Relative: 0 %
Blasts: 0 %
Eosinophils Absolute: 0.2 10*3/uL (ref 0.0–0.7)
Eosinophils Relative: 2 %
HCT: 30.7 % — ABNORMAL LOW (ref 36.0–46.0)
Hemoglobin: 10.6 g/dL — ABNORMAL LOW (ref 12.0–15.0)
Lymphocytes Relative: 4 %
Lymphs Abs: 0.3 10*3/uL — ABNORMAL LOW (ref 0.7–4.0)
MCH: 33.1 pg (ref 26.0–34.0)
MCHC: 34.5 g/dL (ref 30.0–36.0)
MCV: 95.9 fL (ref 78.0–100.0)
Metamyelocytes Relative: 0 %
Monocytes Absolute: 0.7 10*3/uL (ref 0.1–1.0)
Monocytes Relative: 8 %
Myelocytes: 0 %
Neutro Abs: 7.5 10*3/uL (ref 1.7–7.7)
Neutrophils Relative %: 86 %
Platelets: 282 10*3/uL (ref 150–400)
Promyelocytes Absolute: 0 %
RBC: 3.2 MIL/uL — ABNORMAL LOW (ref 3.87–5.11)
RDW: 12.5 % (ref 11.5–15.5)
WBC: 8.7 10*3/uL (ref 4.0–10.5)
nRBC: 0 /100 WBC

## 2016-03-06 LAB — URINALYSIS, ROUTINE W REFLEX MICROSCOPIC
Glucose, UA: NEGATIVE mg/dL
Hgb urine dipstick: NEGATIVE
Ketones, ur: NEGATIVE mg/dL
Nitrite: NEGATIVE
Protein, ur: NEGATIVE mg/dL
Specific Gravity, Urine: 1.029 (ref 1.005–1.030)
pH: 5.5 (ref 5.0–8.0)

## 2016-03-06 LAB — COMPREHENSIVE METABOLIC PANEL
ALT: 69 U/L — ABNORMAL HIGH (ref 14–54)
AST: 56 U/L — ABNORMAL HIGH (ref 15–41)
Albumin: 3 g/dL — ABNORMAL LOW (ref 3.5–5.0)
Alkaline Phosphatase: 82 U/L (ref 38–126)
Anion gap: 8 (ref 5–15)
BUN: 11 mg/dL (ref 6–20)
CO2: 26 mmol/L (ref 22–32)
Calcium: 8.8 mg/dL — ABNORMAL LOW (ref 8.9–10.3)
Chloride: 97 mmol/L — ABNORMAL LOW (ref 101–111)
Creatinine, Ser: 0.63 mg/dL (ref 0.44–1.00)
GFR calc Af Amer: >60 mL/min (ref >60)
GFR calc non Af Amer: >60 mL/min (ref >60)
Glucose, Bld: 119 mg/dL — ABNORMAL HIGH (ref 65–99)
Potassium: 4.6 mmol/L (ref 3.5–5.1)
Sodium: 131 mmol/L — ABNORMAL LOW (ref 135–145)
Total Bilirubin: 0.3 mg/dL (ref 0.3–1.2)
Total Protein: 6.2 g/dL — ABNORMAL LOW (ref 6.5–8.1)

## 2016-03-06 LAB — URINALYSIS, MICROSCOPIC (REFLEX): RBC / HPF: NONE SEEN RBC/hpf (ref 0–5)

## 2016-03-06 MED ORDER — SODIUM CHLORIDE 0.9 % IV BOLUS (SEPSIS)
500.0000 mL | Freq: Once | INTRAVENOUS | Status: AC
  Administered 2016-03-06: 500 mL via INTRAVENOUS

## 2016-03-06 MED ORDER — ENSURE ENLIVE PO LIQD
237.0000 mL | Freq: Two times a day (BID) | ORAL | Status: DC
Start: 2016-03-07 — End: 2016-03-07
  Administered 2016-03-07 (×2): 237 mL via ORAL

## 2016-03-06 MED ORDER — DEXTROSE 5 % IV SOLN
1.0000 g | Freq: Once | INTRAVENOUS | Status: AC
  Administered 2016-03-06: 1 g via INTRAVENOUS
  Filled 2016-03-06: qty 10

## 2016-03-06 MED ORDER — ACETAMINOPHEN 325 MG PO TABS
650.0000 mg | ORAL_TABLET | Freq: Once | ORAL | Status: AC
  Administered 2016-03-06: 650 mg via ORAL
  Filled 2016-03-06: qty 2

## 2016-03-06 NOTE — ED Notes (Signed)
Pt given soup to eat per MD.   Pt also put on 2L 02  for 02 sat 92 after having a round of coughing.

## 2016-03-06 NOTE — ED Provider Notes (Signed)
[facility]   CSN: [id number] Arrival date & time: 03/06/16  1433     History   Chief Complaint Chief Complaint  Patient presents with  . Cough  . Fever    HPI Lisa [name] is a 82 y.o. female.  HPI Patient lives in assisted living facility. She's been running fever for the past 8-9 days. She's had cough with some sputum production. Complains of generalized weakness and decreased mobility. At one day of loose stools but denies any nausea or vomiting. Complains of nasal congestion and "stuffy" ears. No new rashes. No chest pain or abdominal pain. No neck pain or stiffness. Past Medical History:  Diagnosis Date  . Arthritis   . Carotid artery occlusion   . Cataract   . Glaucoma   . High cholesterol   . Hypertension   . Osteoporosis   . Pneumonia July 03, 2014    Patient Active Problem List   Diagnosis Date Noted  . UTI (urinary tract infection) 03/06/2016  . Essential tremor 10/12/2015  . Malnutrition of moderate degree (HCC) 07/05/2014  . Acute respiratory failure with hypoxia (HCC) 07/04/2014  . CAP (community acquired pneumonia) 07/04/2014  . Nausea with vomiting 07/04/2014  . Dizziness and giddiness 07/04/2014  . Dehydration 07/04/2014  . Tachycardia 07/04/2014  . Hypertension   . Asymptomatic carotid artery stenosis without infarction 12/20/2013  . Chronic venous insufficiency 12/20/2013    Past Surgical History:  Procedure Laterality Date  . ABDOMINAL HYSTERECTOMY  1969  . APPENDECTOMY    . EYE SURGERY      OB History    No data available       Home Medications    Prior to Admission medications   Medication Sig Start Date End Date Taking? Authorizing Provider  aspirin EC 81 MG tablet Take 81 mg by mouth daily.    Historical Provider, MD  calcium carbonate (OS-CAL) 600 MG TABS Take 600 mg by mouth 2 (two) times daily with a meal.    Historical Provider, MD  diptheria-tetanus toxoids [facility](DECAVAC) 2-2 LF/0.5ML injection   02/12/15   Historical Provider, MD  Multiple Vitamin (MULTIVITAMIN) tablet Take 1 tablet by mouth daily.    Historical Provider, MD  primidone (MYSOLINE) 50 MG tablet Take 3 tablets (150 mg total) by mouth at bedtime. 07/29/15   Anson FretAntonia B Ahern, MD  propranolol (INDERAL) 10 MG tablet Take 2 tablets (20 mg total) by mouth 2 (two) times daily. Patient taking differently: Take 10 mg by mouth 3 (three) times daily.  11/19/15   Anson FretAntonia B Ahern, MD  raloxifene (EVISTA) 60 MG tablet Take 60 mg by mouth at bedtime.     Historical Provider, MD  timolol (TIMOPTIC) 0.5 % ophthalmic solution  02/25/15   Historical Provider, MD  VYTORIN 10-20 MG tablet Take 1 tablet by mouth daily. 03/11/15   Historical Provider, MD    Family History Family History  Problem Relation Age of Onset  . Hypertension Mother   . Heart disease Mother   . Hyperlipidemia Brother   . Hypertension Brother     Social History Social History  Substance Use Topics  . Smoking status: Never Smoker  . Smokeless tobacco: Never Used  . Alcohol use No     Comment: rare     Allergies   Mycostatin [nystatin]   Review of Systems Review of Systems  Constitutional: Positive for activity change, appetite change, chills, fatigue and fever.  HENT: Positive for congestion and sore throat.  Respiratory: Positive for cough. Negative for shortness of breath.   Cardiovascular: Negative for chest pain, palpitations and leg swelling.  Gastrointestinal: Positive for diarrhea. Negative for abdominal pain, nausea and vomiting.  Genitourinary: Negative for dysuria, flank pain, frequency and hematuria.  Musculoskeletal: Negative for back pain, joint swelling, myalgias and neck stiffness.  Skin: Negative for rash and wound.  Neurological: Positive for tremors and weakness (generalized). Negative for dizziness, light-headedness, numbness and headaches.  All other systems reviewed and are negative.    Physical Exam Updated Vital Signs BP  160/75 (BP Location: Left Arm)   Pulse 103   Temp 99.3 F (37.4 C) (Oral)   Resp 26   Wt 136 lb (61.7 kg)   SpO2 93%   BMI 25.70 kg/m   Physical Exam  Constitutional: She is oriented to person, place, and time. She appears well-developed and well-nourished. No distress.  HENT:  Head: Normocephalic and atraumatic.  Mouth/Throat: Oropharynx is clear and moist.  Mild bilateral nasal mucosal edema. Mild bilateral TM erythema and oropharyngeal erythema. No tonsillar exudates  Eyes: EOM are normal. Pupils are equal, round, and reactive to light.  Neck: Normal range of motion. Neck supple. No JVD present.  No meningismus  Cardiovascular: Normal rate and regular rhythm.  Exam reveals no friction rub.   No murmur heard. Pulmonary/Chest: Effort normal and breath sounds normal. No respiratory distress. She has no wheezes. She has no rales. She exhibits no tenderness.  Abdominal: Soft. Bowel sounds are normal. There is no tenderness. There is no rebound and no guarding.  Musculoskeletal: Normal range of motion. She exhibits no edema or tenderness.  No lower extremity swelling, asymmetry or tenderness.  Neurological: She is alert and oriented to person, place, and time.  5/5 motor in all extremities. Sensation intact.  Skin: Skin is warm and dry. Capillary refill takes less than 2 seconds. No rash noted. No erythema.  Psychiatric: She has a normal mood and affect. Her behavior is normal.  Nursing note and vitals reviewed.    ED Treatments / Results  Labs (all labs ordered are listed, but only abnormal results are displayed) Labs Reviewed  CBC WITH DIFFERENTIAL/PLATELET - Abnormal; Notable for the following:       Result Value   RBC 3.20 (*)    Hemoglobin 10.6 (*)    HCT 30.7 (*)    Lymphs Abs 0.3 (*)    All other components within normal limits  COMPREHENSIVE METABOLIC PANEL - Abnormal; Notable for the following:    Sodium 131 (*)    Chloride 97 (*)    Glucose, Bld 119 (*)     Calcium 8.8 (*)    Total Protein 6.2 (*)    Albumin 3.0 (*)    AST 56 (*)    ALT 69 (*)    All other components within normal limits  URINALYSIS, ROUTINE W REFLEX MICROSCOPIC - Abnormal; Notable for the following:    APPearance CLOUDY (*)    Bilirubin Urine SMALL (*)    Leukocytes, UA TRACE (*)    All other components within normal limits  URINALYSIS, MICROSCOPIC (REFLEX) - Abnormal; Notable for the following:    Bacteria, UA MANY (*)    Squamous Epithelial / LPF 0-5 (*)    All other components within normal limits  INFLUENZA PANEL BY PCR (TYPE A & B, H1N1)    EKG  EKG Interpretation  Date/Time:  Saturday March 06 2016 15:49:02 EST Ventricular Rate:  89 PR Interval:    QRS  Duration: 127 QT Interval:  376 QTC Calculation: 458 R Axis:   56 Text Interpretation:  Sinus rhythm Left bundle branch block Confirmed by Ranae Palms  MD, Frankie Scipio ([id number]) on 03/06/2016 7:09:06 PM       Radiology Dg Chest 2 View  Result Date: 03/06/2016 CLINICAL DATA:  Congestion and fever for almost 10 days and not getting better. EXAM: CHEST  2 VIEW COMPARISON:  07/03/2014. FINDINGS: Cardiac silhouette is mildly enlarged. No mediastinal or hilar masses. No convincing adenopathy. Calcified nodes are noted along the left hilum. There calcified granuloma in the left upper lobe. Scarring or chronic subsegmental atelectasis, or a combination, is noted at the lung bases. Lungs are hyperexpanded but otherwise clear. No convincing pleural effusion.  No pneumothorax. Skeletal structures are demineralized but grossly intact. IMPRESSION: No acute cardiopulmonary disease. Electronically Signed   By: Amie Portland M.D.   On: 03/06/2016 15:37    Procedures Procedures (including critical care time)  Medications Ordered in ED Medications  sodium chloride 0.9 % bolus 500 mL (0 mLs Intravenous Stopped 03/06/16 1701)  cefTRIAXone (ROCEPHIN) 1 g in dextrose 5 % 50 mL IVPB (0 g Intravenous Stopped 03/06/16 1805)      Initial Impression / Assessment and Plan / ED Course  I have reviewed the triage vital signs and the nursing notes.  Pertinent labs & imaging results that were available during my care of the patient were reviewed by me and considered in my medical decision making (see chart for details).  Clinical Course    Patient with evidence of UTI. He also has URI. No evidence of pneumonia on x-ray. Start on IV Rocephin and given IV fluids. Patient normally is able to perform all of her activities of daily living. Over the last few days she's had difficulty getting up out of bed. Discussed with hospitalist and we'll admit to MedSurg bed. Discussed with patient and patient's daughter. Both agree with the plan.  Final Clinical Impressions(s) / ED Diagnoses   Final diagnoses:  Urinary tract infection without hematuria, site unspecified  Generalized weakness    New Prescriptions New Prescriptions   No medications on file     Loren Racer, MD 03/06/16 1909

## 2016-03-06 NOTE — ED Notes (Signed)
[facility] en route to facility at this time.

## 2016-03-06 NOTE — ED Notes (Signed)
Pt leaving with [facility] at this time, vitals stable, pt alert and stable.  [facility] sent with: EMTALA, facesheet, [facility] transfer, med necessity form, e-signature.   [name] on 6E at [facility] notified that [facility] took pt off o2 due to RA sat being 94%.  Also notified that pt was en route to facility at this time.

## 2016-03-06 NOTE — ED Triage Notes (Signed)
Per daughter, 10 days ago she got a sore throat, was eval by MD and was told it was allergies. Fever x 8-9 days and was re-eval by MD on Wed  Weakness, no n/v or diarrhea, cough. Given tlyenol 2 tabs at 1230. Tremor noted to face, which is per norm per daughter.

## 2016-03-06 NOTE — ED Notes (Signed)
Pt placed on bedpan for about 5-7 minutes per patient request.  Pt kept feeling like she had to urinate but had small amount of urine in bedpan.  Bedpan removed at this time, depend applied, skin cleaned and dried, new linens placed on bed, and pt repositioned.

## 2016-03-06 NOTE — ED Notes (Signed)
Verified with Dr. Ranae PalmsYelverton that pt can be transported via [facility] for transfer due to [facility] trucks being behind [place] the [place].

## 2016-03-06 NOTE — ED Notes (Signed)
Patient transported to X-ray 

## 2016-03-06 NOTE — ED Notes (Signed)
Pt placed on bedpan appx 5 min ago, pt still wants to sit on pan because she feels like she has to urinate more.

## 2016-03-07 DIAGNOSIS — R531 Weakness: Secondary | ICD-10-CM | POA: Diagnosis not present

## 2016-03-07 DIAGNOSIS — N3000 Acute cystitis without hematuria: Secondary | ICD-10-CM

## 2016-03-07 DIAGNOSIS — G250 Essential tremor: Secondary | ICD-10-CM | POA: Diagnosis not present

## 2016-03-07 DIAGNOSIS — N390 Urinary tract infection, site not specified: Secondary | ICD-10-CM | POA: Diagnosis not present

## 2016-03-07 DIAGNOSIS — J9601 Acute respiratory failure with hypoxia: Secondary | ICD-10-CM | POA: Diagnosis not present

## 2016-03-07 LAB — CBC
HCT: 29.5 % — ABNORMAL LOW (ref 36.0–46.0)
Hemoglobin: 10.1 g/dL — ABNORMAL LOW (ref 12.0–15.0)
MCH: 32.4 pg (ref 26.0–34.0)
MCHC: 34.2 g/dL (ref 30.0–36.0)
MCV: 94.6 fL (ref 78.0–100.0)
Platelets: 260 10*3/uL (ref 150–400)
RBC: 3.12 MIL/uL — ABNORMAL LOW (ref 3.87–5.11)
RDW: 13.1 % (ref 11.5–15.5)
WBC: 9 10*3/uL (ref 4.0–10.5)

## 2016-03-07 LAB — COMPREHENSIVE METABOLIC PANEL
ALT: 59 U/L — ABNORMAL HIGH (ref 14–54)
AST: 45 U/L — ABNORMAL HIGH (ref 15–41)
Albumin: 2.3 g/dL — ABNORMAL LOW (ref 3.5–5.0)
Alkaline Phosphatase: 78 U/L (ref 38–126)
Anion gap: 8 (ref 5–15)
BUN: 7 mg/dL (ref 6–20)
CO2: 27 mmol/L (ref 22–32)
Calcium: 8.6 mg/dL — ABNORMAL LOW (ref 8.9–10.3)
Chloride: 98 mmol/L — ABNORMAL LOW (ref 101–111)
Creatinine, Ser: 0.66 mg/dL (ref 0.44–1.00)
GFR calc Af Amer: >60 mL/min (ref >60)
GFR calc non Af Amer: >60 mL/min (ref >60)
Glucose, Bld: 101 mg/dL — ABNORMAL HIGH (ref 65–99)
Potassium: 4.2 mmol/L (ref 3.5–5.1)
Sodium: 133 mmol/L — ABNORMAL LOW (ref 135–145)
Total Bilirubin: 0.5 mg/dL (ref 0.3–1.2)
Total Protein: 5.4 g/dL — ABNORMAL LOW (ref 6.5–8.1)

## 2016-03-07 LAB — CREATININE, URINE, RANDOM: Creatinine, Urine: 64.91 mg/dL

## 2016-03-07 LAB — MAGNESIUM: Magnesium: 2.1 mg/dL (ref 1.7–2.4)

## 2016-03-07 LAB — EXPECTORATED SPUTUM ASSESSMENT W GRAM STAIN, RFLX TO RESP C

## 2016-03-07 LAB — URIC ACID: Uric Acid, Serum: 2.8 mg/dL (ref 2.3–6.6)

## 2016-03-07 LAB — INFLUENZA PANEL BY PCR (TYPE A & B)
Influenza A By PCR: NEGATIVE
Influenza B By PCR: NEGATIVE

## 2016-03-07 LAB — SODIUM, URINE, RANDOM: Sodium, Ur: 186 mmol/L

## 2016-03-07 LAB — TSH: TSH: 0.666 u[IU]/mL (ref 0.350–4.500)

## 2016-03-07 LAB — OSMOLALITY, URINE: Osmolality, Ur: 580 mOsm/kg (ref 300–900)

## 2016-03-07 MED ORDER — DM-GUAIFENESIN ER 30-600 MG PO TB12
1.0000 | ORAL_TABLET | Freq: Two times a day (BID) | ORAL | Status: DC
  Administered 2016-03-07 (×2): 1 via ORAL
  Filled 2016-03-07 (×2): qty 1

## 2016-03-07 MED ORDER — PROPRANOLOL HCL 20 MG PO TABS
10.0000 mg | ORAL_TABLET | Freq: Three times a day (TID) | ORAL | Status: DC
  Administered 2016-03-07 – 2016-03-10 (×11): 10 mg via ORAL
  Filled 2016-03-07 (×12): qty 1

## 2016-03-07 MED ORDER — NAPHAZOLINE-GLYCERIN 0.012-0.2 % OP SOLN
1.0000 [drp] | Freq: Four times a day (QID) | OPHTHALMIC | Status: DC | PRN
  Filled 2016-03-07: qty 15

## 2016-03-07 MED ORDER — LEVALBUTEROL HCL 0.63 MG/3ML IN NEBU
0.6300 mg | INHALATION_SOLUTION | Freq: Three times a day (TID) | RESPIRATORY_TRACT | Status: DC
  Administered 2016-03-07 (×2): 0.63 mg via RESPIRATORY_TRACT
  Filled 2016-03-07 (×2): qty 3

## 2016-03-07 MED ORDER — EZETIMIBE-SIMVASTATIN 10-20 MG PO TABS
1.0000 | ORAL_TABLET | Freq: Every day | ORAL | Status: DC
  Administered 2016-03-07 – 2016-03-10 (×4): 1 via ORAL
  Filled 2016-03-07 (×4): qty 1

## 2016-03-07 MED ORDER — ACETAMINOPHEN 325 MG PO TABS
650.0000 mg | ORAL_TABLET | Freq: Four times a day (QID) | ORAL | Status: DC | PRN
  Filled 2016-03-07: qty 2

## 2016-03-07 MED ORDER — IPRATROPIUM-ALBUTEROL 0.5-2.5 (3) MG/3ML IN SOLN
3.0000 mL | Freq: Four times a day (QID) | RESPIRATORY_TRACT | Status: DC
  Filled 2016-03-07: qty 3

## 2016-03-07 MED ORDER — ENOXAPARIN SODIUM 40 MG/0.4ML ~~LOC~~ SOLN
40.0000 mg | SUBCUTANEOUS | Status: DC
  Administered 2016-03-07 – 2016-03-10 (×4): 40 mg via SUBCUTANEOUS
  Filled 2016-03-07 (×4): qty 0.4

## 2016-03-07 MED ORDER — WHITE PETROLATUM GEL
Status: AC
  Administered 2016-03-07: 02:00:00
  Filled 2016-03-07: qty 1

## 2016-03-07 MED ORDER — IPRATROPIUM-ALBUTEROL 0.5-2.5 (3) MG/3ML IN SOLN
3.0000 mL | RESPIRATORY_TRACT | Status: DC | PRN
  Administered 2016-03-07: 3 mL via RESPIRATORY_TRACT
  Filled 2016-03-07: qty 3

## 2016-03-07 MED ORDER — CALCIUM CARBONATE 1250 (500 CA) MG PO TABS
1250.0000 mg | ORAL_TABLET | Freq: Two times a day (BID) | ORAL | Status: DC
  Administered 2016-03-07 – 2016-03-10 (×8): 1250 mg via ORAL
  Filled 2016-03-07 (×8): qty 1

## 2016-03-07 MED ORDER — PRIMIDONE 50 MG PO TABS
150.0000 mg | ORAL_TABLET | Freq: Every day | ORAL | Status: DC
  Administered 2016-03-07 – 2016-03-09 (×4): 150 mg via ORAL
  Filled 2016-03-07 (×5): qty 3

## 2016-03-07 MED ORDER — TIMOLOL MALEATE 0.5 % OP SOLN
1.0000 [drp] | Freq: Every day | OPHTHALMIC | Status: DC
  Administered 2016-03-07 – 2016-03-10 (×4): 1 [drp] via OPHTHALMIC
  Filled 2016-03-07: qty 5

## 2016-03-07 MED ORDER — NAPHAZOLINE-GLYCERIN 0.03-0.5 % OP SOLN
1.0000 [drp] | Freq: Four times a day (QID) | OPHTHALMIC | Status: DC | PRN
  Administered 2016-03-07: 2 [drp] via OPHTHALMIC
  Administered 2016-03-08: 1 [drp] via OPHTHALMIC
  Filled 2016-03-07 (×3): qty 15

## 2016-03-07 MED ORDER — ACETAMINOPHEN 650 MG RE SUPP: 650.0000 mg | Freq: Four times a day (QID) | RECTAL | Status: DC | PRN

## 2016-03-07 MED ORDER — GUAIFENESIN ER 600 MG PO TB12
1200.0000 mg | ORAL_TABLET | Freq: Two times a day (BID) | ORAL | Status: DC
  Administered 2016-03-07 – 2016-03-10 (×6): 1200 mg via ORAL
  Filled 2016-03-07 (×6): qty 2

## 2016-03-07 MED ORDER — ADULT MULTIVITAMIN W/MINERALS CH
1.0000 | ORAL_TABLET | Freq: Every day | ORAL | Status: DC
  Administered 2016-03-07 – 2016-03-10 (×4): 1 via ORAL
  Filled 2016-03-07 (×4): qty 1

## 2016-03-07 MED ORDER — ONDANSETRON HCL 4 MG PO TABS: 4.0000 mg | ORAL_TABLET | Freq: Four times a day (QID) | ORAL | Status: DC | PRN

## 2016-03-07 MED ORDER — ASPIRIN EC 81 MG PO TBEC
81.0000 mg | DELAYED_RELEASE_TABLET | Freq: Every day | ORAL | Status: DC
  Administered 2016-03-07 – 2016-03-10 (×4): 81 mg via ORAL
  Filled 2016-03-07 (×4): qty 1

## 2016-03-07 MED ORDER — ENSURE ENLIVE PO LIQD
237.0000 mL | Freq: Three times a day (TID) | ORAL | Status: DC
  Administered 2016-03-08 – 2016-03-10 (×3): 237 mL via ORAL

## 2016-03-07 MED ORDER — LEVALBUTEROL HCL 0.63 MG/3ML IN NEBU
0.6300 mg | INHALATION_SOLUTION | Freq: Two times a day (BID) | RESPIRATORY_TRACT | Status: DC
  Administered 2016-03-08 – 2016-03-10 (×5): 0.63 mg via RESPIRATORY_TRACT
  Filled 2016-03-07 (×6): qty 3

## 2016-03-07 MED ORDER — POLYVINYL ALCOHOL 1.4 % OP SOLN
2.0000 [drp] | OPHTHALMIC | Status: DC | PRN
  Filled 2016-03-07: qty 15

## 2016-03-07 MED ORDER — DEXTROSE 5 % IV SOLN
1.0000 g | INTRAVENOUS | Status: DC
  Administered 2016-03-07 – 2016-03-09 (×3): 1 g via INTRAVENOUS
  Filled 2016-03-07 (×4): qty 10

## 2016-03-07 MED ORDER — ONDANSETRON HCL 4 MG/2ML IJ SOLN: 4.0000 mg | Freq: Four times a day (QID) | INTRAMUSCULAR | Status: DC | PRN

## 2016-03-07 MED ORDER — SODIUM CHLORIDE 0.9 % IV SOLN
INTRAVENOUS | Status: DC
  Administered 2016-03-07: 02:00:00 via INTRAVENOUS

## 2016-03-07 MED ORDER — NAPHAZOLINE-GLYCERIN 0.03-0.5 % OP SOLN
1.0000 [drp] | Freq: Four times a day (QID) | OPHTHALMIC | Status: DC | PRN
  Filled 2016-03-07: qty 15

## 2016-03-07 MED ORDER — SODIUM CHLORIDE 0.9 % IV SOLN
INTRAVENOUS | Status: DC
  Administered 2016-03-07 – 2016-03-09 (×4): via INTRAVENOUS
  Administered 2016-03-10: 50 mL/h via INTRAVENOUS

## 2016-03-07 MED ORDER — RALOXIFENE HCL 60 MG PO TABS
60.0000 mg | ORAL_TABLET | Freq: Every day | ORAL | Status: DC
  Administered 2016-03-07 – 2016-03-09 (×4): 60 mg via ORAL
  Filled 2016-03-07 (×5): qty 1

## 2016-03-07 NOTE — Progress Notes (Addendum)
MD notified about HR at 115 this AM. MD stated to get an EKG 12 lead. Order placed and EKG done and in chart for review. Shows Sinus Tachycardia. Pt also requesting something for eye as it is itching, put in order for liquid tears and told pt and daughter to notify MD this AM if problem persists. Pt resting comfortably with daughter in room.

## 2016-03-07 NOTE — H&P (Signed)
History and Physical    Lisa Canardancy O Nary UEA:[id number] DOB: 11-Jun-1923 DOA: 03/06/2016  PCP: Gaye AlkenBARNES,[name], MD  [facility]  Chief Complaint: weakness  HPI: Lisa [name] is a 82 y.o. female with medical history significant of CAD/PAD/HTN here for weakness.  She was seen by PMD and noted to have  Viral URI.  Supportive care was recommended.  The symptoms continued for 8-9days, yesterday due to weakness, fatigue dtr brought pt to ER for evaluation.  Noted to have + UTI and was given iv abx.  No report of chest pain, palpitations, sob, cough, hemoptysis, nausea, vomiting, fever, chills, abdominal pain, dysuria, hematuria, polyuria, nocturia, BRBPR, melena, hematochezia, heat/cold intolerance, orthopnea, PND, or leg swelling Dtr has many concerns for her mother's care.  ED Course: IV rocephin, IVF, [facility] care.  Review of Systems: As per HPI otherwise 10 point review of systems negative.   Past Medical History:  Diagnosis Date  . Arthritis   . Carotid artery occlusion   . Cataract   . Glaucoma   . High cholesterol   . Hypertension   . Osteoporosis   . Pneumonia July 03, 2014    Past Surgical History:  Procedure Laterality Date  . ABDOMINAL HYSTERECTOMY  1969  . APPENDECTOMY    . EYE SURGERY       reports that she has never smoked. She has never used smokeless tobacco. She reports that she does not drink alcohol or use drugs.  Allergies  Allergen Reactions  . Mycostatin [Nystatin] Swelling and Rash    Family History  Problem Relation Age of Onset  . Hypertension Mother   . Heart disease Mother   . Hyperlipidemia Brother   . Hypertension Brother    Family hx reviewed  Prior to Admission medications   Medication Sig Start Date End Date Taking? Authorizing Provider  aspirin EC 81 MG tablet Take 81 mg by mouth daily.    Historical Provider, MD  calcium carbonate (OS-CAL) 600 MG TABS Take 600 mg by mouth 2  (two) times daily with a meal.    Historical Provider, MD  diptheria-tetanus toxoids [facility](DECAVAC) 2-2 LF/0.5ML injection  02/12/15   Historical Provider, MD  Multiple Vitamin (MULTIVITAMIN) tablet Take 1 tablet by mouth daily.    Historical Provider, MD  primidone (MYSOLINE) 50 MG tablet Take 3 tablets (150 mg total) by mouth at bedtime. 07/29/15   Anson FretAntonia B Ahern, MD  propranolol (INDERAL) 10 MG tablet Take 2 tablets (20 mg total) by mouth 2 (two) times daily. Patient taking differently: Take 10 mg by mouth 3 (three) times daily.  11/19/15   Anson FretAntonia B Ahern, MD  raloxifene (EVISTA) 60 MG tablet Take 60 mg by mouth at bedtime.     Historical Provider, MD  timolol (TIMOPTIC) 0.5 % ophthalmic solution  02/25/15   Historical Provider, MD  VYTORIN 10-20 MG tablet Take 1 tablet by mouth daily. 03/11/15   Historical Provider, MD    Physical Exam: Vitals:   03/06/16 2030 03/06/16 2115 03/06/16 2124 03/06/16 2233  BP: 151/62  144/77 (!) 132/54  Pulse: 103  113 (!) 107  Resp: 20  24 (!) 23  Temp:  98.6 F (37 C) 98.6 F (37 C) 98.3 F (36.8 C)  TempSrc:  Oral  Oral  SpO2: 99%  100% 100%  Weight:    62 kg (136 lb 11 oz)      Constitutional: NAD, calm, comfortable Vitals:   03/06/16  2030 03/06/16 2115 03/06/16 2124 03/06/16 2233  BP: 151/62  144/77 (!) 132/54  Pulse: 103  113 (!) 107  Resp: 20  24 (!) 23  Temp:  98.6 F (37 C) 98.6 F (37 C) 98.3 F (36.8 C)  TempSrc:  Oral  Oral  SpO2: 99%  100% 100%  Weight:    62 kg (136 lb 11 oz)   Eyes: PERRL, lids and conjunctivae normal, anicteric ENMT: Mucous membranes are moist. Posterior pharynx clear of any exudate or lesions.Normal dentition.  Neck: normal, supple, no masses, no thyromegaly Respiratory: clear to auscultation bilaterally, no wheezing, no crackles. Normal respiratory effort. No accessory muscle use.  Cardiovascular: Regular rate and rhythm, no murmurs / rubs / gallops. No extremity edema. 2+ pedal pulses. No carotid bruits.    Abdomen: no tenderness, no masses palpated. No hepatosplenomegaly. Bowel sounds positive.  Musculoskeletal: no clubbing / cyanosis. No joint deformity upper and lower extremities. Good ROM, no contractures. Normal muscle tone.  Skin: no rashes, lesions, ulcers. No induration Neurologic: CN 2-12 grossly intact. Sensation intact, DTR normal. Strength 5/5 in all 4.  Psychiatric: Normal judgment and insight. Alert and oriented x 3. Normal mood.  Capillary refill less than 2 seconds   Labs on Admission: I have personally reviewed following labs and imaging studies  CBC:  Recent Labs Lab 03/06/16 1556  WBC 8.7  NEUTROABS 7.5  HGB 10.6*  HCT 30.7*  MCV 95.9  PLT 282   Basic Metabolic Panel:  Recent Labs Lab 03/06/16 1556  NA 131*  K 4.6  CL 97*  CO2 26  GLUCOSE 119*  BUN 11  CREATININE 0.63  CALCIUM 8.8*   GFR: Estimated Creatinine Clearance: 39.5 mL/min (by C-G formula based on SCr of 0.63 mg/dL). Liver Function Tests:  Recent Labs Lab 03/06/16 1556  AST 56*  ALT 69*  ALKPHOS 82  BILITOT 0.3  PROT 6.2*  ALBUMIN 3.0*   No results for input(s): LIPASE, AMYLASE in the last 168 hours. No results for input(s): AMMONIA in the last 168 hours. Coagulation Profile: No results for input(s): INR, PROTIME in the last 168 hours. Cardiac Enzymes: No results for input(s): CKTOTAL, CKMB, CKMBINDEX, TROPONINI in the last 168 hours. BNP (last 3 results) No results for input(s): PROBNP in the last 8760 hours. HbA1C: No results for input(s): HGBA1C in the last 72 hours. CBG: No results for input(s): GLUCAP in the last 168 hours. Lipid Profile: No results for input(s): CHOL, HDL, LDLCALC, TRIG, CHOLHDL, LDLDIRECT in the last 72 hours. Thyroid Function Tests: No results for input(s): TSH, T4TOTAL, FREET4, T3FREE, THYROIDAB in the last 72 hours. Anemia Panel: No results for input(s): VITAMINB12, FOLATE, FERRITIN, TIBC, IRON, RETICCTPCT in the last 72 hours. Urine analysis:     Component Value Date/Time   COLORURINE YELLOW 03/06/2016 1535   APPEARANCEUR CLOUDY (A) 03/06/2016 1535   LABSPEC 1.029 03/06/2016 1535   PHURINE 5.5 03/06/2016 1535   GLUCOSEU NEGATIVE 03/06/2016 1535   HGBUR NEGATIVE 03/06/2016 1535   BILIRUBINUR SMALL (A) 03/06/2016 1535   BILIRUBINUR small 11/28/2011 1136   KETONESUR NEGATIVE 03/06/2016 1535   PROTEINUR NEGATIVE 03/06/2016 1535   UROBILINOGEN 0.2 07/03/2014 1941   NITRITE NEGATIVE 03/06/2016 1535   LEUKOCYTESUR TRACE (A) 03/06/2016 1535    Sepsis Labs:  No results found for this or any previous visit (from the past 240 hour(s)).   Radiological Exams on Admission: Dg Chest 2 View  Result Date: 03/06/2016 CLINICAL DATA:  Congestion and fever for almost 10  days and not getting better. EXAM: CHEST  2 VIEW COMPARISON:  07/03/2014. FINDINGS: Cardiac silhouette is mildly enlarged. No mediastinal or hilar masses. No convincing adenopathy. Calcified nodes are noted along the left hilum. There calcified granuloma in the left upper lobe. Scarring or chronic subsegmental atelectasis, or a combination, is noted at the lung bases. Lungs are hyperexpanded but otherwise clear. No convincing pleural effusion.  No pneumothorax. Skeletal structures are demineralized but grossly intact. IMPRESSION: No acute cardiopulmonary disease. Electronically Signed   By: Amie Portlandavid  Ormond M.D.   On: 03/06/2016 15:37    EKG: by my independent review. NSR 89, no Qw, + LBBB, PRWP  Assessment/Plan Principal Problem:   UTI (urinary tract infection) Active Problems:   Asymptomatic carotid artery stenosis without infarction   Hypertension   Essential tremor   Generalized weakness     UTI: iv rocephin, check cultures, monitor, adjust based on result.    HTN: continue meds, monitor, optimize care and Rx  CAD/PAD: stable, continue meds. Chronic   Essential tremor: no change, continue with meds, monitor, stable chronic  Osteoporosis: stable chronic , continue  meds  Hyperlipidemia: stable chronic, continue meds  DVT prophylaxis: LMWH  Code Status: No CPR, intubate is ok  Family Communication: spoke to daughter  Disposition Plan: Independent living  Consults called: none  Admission status: full admit    Kerrin Moobin Cate Oravec MD [facility] Pager [phone]  If 7PM-7AM, please contact night-coverage [email or web address] Password [email or web address]  03/07/2016, 12:31 AM

## 2016-03-07 NOTE — Progress Notes (Addendum)
PROGRESS NOTE        PATIENT DETAILS Name: Lisa [name] Age: 82 y.o. Sex: female Date of Birth: Dec 03, 1923 Admit Date: 03/06/2016 Admitting Physician Lorretta Harp, MD ZOX:[id number] STEWART, MD  Brief Narrative: Patient is a 82 y.o. female with past medical of essential tremor admitted with approximately one-week history of intermittent low-grade fever, cough, nasal congestion, sore throat. Evaluated in the urgent care, thought to have UTI and admitted to the hospitalist service.  Subjective: Coughing-her main complaint is just generalized weakness. Daughter acknowledges poor appetite as well.  Assessment/Plan: Suspected acute bronchitis: Influenza PCR negative, some scattered rhonchi-continue bronchodilators. If no improvement, will start a course of low-dose steroids.  ? UTI: Continue Rocephin, await cultures.  Failure to thrive syndrome: Encourage ambulation with PT, continue nutritional supplements. Etiology felt to be probably secondary to viral syndrome, advanced age.  Deconditioning: Secondary to acute illness-PT evaluation. May need SNF  Essential tremor: Continue primidone and propranolol.  Dyslipidemia: Continue Vytorin.  Asymptomatic carotid artery stenosis without infarction: Continue outpatient follow-up with vascular surgery.  Other issues-family wants to keep the Foley in one additional day-plan to discontinue tomorrow.  DVT Prophylaxis: Prophylactic Lovenox   Code Status: Partial code  Family Communication: Daughter at bedside  Disposition Plan: Remain inpatient-may SNF on discharge over the next few days  Antimicrobial agents: Anti-infectives    Start     Dose/Rate Route Frequency Ordered Stop   03/07/16 1800  cefTRIAXone (ROCEPHIN) 1 g in dextrose 5 % 50 mL IVPB     1 g 100 mL/hr over 30 Minutes Intravenous Every 24 hours 03/07/16 0031     03/06/16 1700  cefTRIAXone (ROCEPHIN) 1 g in dextrose 5 % 50 mL IVPB     1  g 100 mL/hr over 30 Minutes Intravenous  Once 03/06/16 1657 03/06/16 1805      Procedures: None  CONSULTS:  None  Time spent: 25- minutes-Greater than 50% of this time was spent in counseling, explanation of diagnosis, planning of further management, and coordination of care.  MEDICATIONS: Scheduled Meds: . aspirin EC  81 mg Oral Daily  . calcium carbonate  1,250 mg Oral BID WC  . cefTRIAXone (ROCEPHIN)  IV  1 g Intravenous Q24H  . enoxaparin (LOVENOX) injection  40 mg Subcutaneous Q24H  . ezetimibe-simvastatin  1 tablet Oral Daily  . feeding supplement (ENSURE ENLIVE)  237 mL Oral BID BM  . guaiFENesin  1,200 mg Oral BID  . levalbuterol  0.63 mg Nebulization Q8H  . multivitamin with minerals  1 tablet Oral Daily  . primidone  150 mg Oral QHS  . propranolol  10 mg Oral TID  . raloxifene  60 mg Oral QHS  . timolol  1 drop Both Eyes Daily   Continuous Infusions: . sodium chloride     PRN Meds:.acetaminophen **OR** acetaminophen, Naphazoline-Glycerin, ondansetron **OR** ondansetron (ZOFRAN) IV, polyvinyl alcohol   PHYSICAL EXAM: Vital signs: Vitals:   03/07/16 0528 03/07/16 0533 03/07/16 0758 03/07/16 0950  BP: (!) 151/63   (!) 156/67  Pulse: (!) 114   (!) 127  Resp: 20   18  Temp: (!) 100.4 F (38 C)  99.5 F (37.5 C) 99.5 F (37.5 C)  TempSrc: Oral  Oral Oral  SpO2: 92% 92%  94%  Weight:       Filed Weights   03/06/16 1449 03/06/16 2233  Weight: 61.7 kg (136 lb) 62 kg (136 lb 11 oz)   Body mass index is 25.83 kg/m.   General appearance :Awake, alert, not in any distress.  Eyes:, pupils equally reactive to light and accomodation,no scleral icterus. HEENT: Atraumatic and Normocephalic Neck: supple, no JVD. No cervical lymphadenopathy. No thyromegaly Resp:Good air entry bilaterally,Some scattered rhonchi  CVS: S1 S2 regular, no murmurs.  GI: Bowel sounds present, Non tender and not distended with no gaurding, rigidity or rebound.No  organomegaly Extremities: B/L Lower Ext shows no edema, both legs are warm to touch Neurology:  speech clear,Non focal, sensation is grossly intact. Psychiatric: Alert and oriented x 3.  Musculoskeletal:No digital cyanosis Skin:No Rash, warm and dry Wounds:N/A  I have personally reviewed following labs and imaging studies  LABORATORY DATA: CBC:  Recent Labs Lab 03/06/16 1556 03/07/16 0251  WBC 8.7 9.0  NEUTROABS 7.5  --   HGB 10.6* 10.1*  HCT 30.7* 29.5*  MCV 95.9 94.6  PLT 282 260    Basic Metabolic Panel:  Recent Labs Lab 03/06/16 1556 03/07/16 0251  NA 131* 133*  K 4.6 4.2  CL 97* 98*  CO2 26 27  GLUCOSE 119* 101*  BUN 11 7  CREATININE 0.63 0.66  CALCIUM 8.8* 8.6*  MG  --  2.1    GFR: Estimated Creatinine Clearance: 39.5 mL/min (by C-G formula based on SCr of 0.66 mg/dL).  Liver Function Tests:  Recent Labs Lab 03/06/16 1556 03/07/16 0251  AST 56* 45*  ALT 69* 59*  ALKPHOS 82 78  BILITOT 0.3 0.5  PROT 6.2* 5.4*  ALBUMIN 3.0* 2.3*   No results for input(s): LIPASE, AMYLASE in the last 168 hours. No results for input(s): AMMONIA in the last 168 hours.  Coagulation Profile: No results for input(s): INR, PROTIME in the last 168 hours.  Cardiac Enzymes: No results for input(s): CKTOTAL, CKMB, CKMBINDEX, TROPONINI in the last 168 hours.  BNP (last 3 results) No results for input(s): PROBNP in the last 8760 hours.  HbA1C: No results for input(s): HGBA1C in the last 72 hours.  CBG: No results for input(s): GLUCAP in the last 168 hours.  Lipid Profile: No results for input(s): CHOL, HDL, LDLCALC, TRIG, CHOLHDL, LDLDIRECT in the last 72 hours.  Thyroid Function Tests:  Recent Labs  03/07/16 0251  TSH 0.666    Anemia Panel: No results for input(s): VITAMINB12, FOLATE, FERRITIN, TIBC, IRON, RETICCTPCT in the last 72 hours.  Urine analysis:    Component Value Date/Time   COLORURINE YELLOW 03/06/2016 1535   APPEARANCEUR CLOUDY (A)  03/06/2016 1535   LABSPEC 1.029 03/06/2016 1535   PHURINE 5.5 03/06/2016 1535   GLUCOSEU NEGATIVE 03/06/2016 1535   HGBUR NEGATIVE 03/06/2016 1535   BILIRUBINUR SMALL (A) 03/06/2016 1535   BILIRUBINUR small 11/28/2011 1136   KETONESUR NEGATIVE 03/06/2016 1535   PROTEINUR NEGATIVE 03/06/2016 1535   UROBILINOGEN 0.2 07/03/2014 1941   NITRITE NEGATIVE 03/06/2016 1535   LEUKOCYTESUR TRACE (A) 03/06/2016 1535    Sepsis Labs: Lactic Acid, Venous    Component Value Date/Time   LATICACIDVEN 0.6 07/04/2014 0146    MICROBIOLOGY: Recent Results (from the past 240 hour(s))  Culture, sputum-assessment     Status: None   Collection Time: 03/07/16  7:57 AM  Result Value Ref Range Status   Specimen Description SPUTUM  Final   Special Requests NONE  Final   Sputum evaluation THIS SPECIMEN IS ACCEPTABLE FOR SPUTUM CULTURE  Final   Report Status 03/07/2016 FINAL  Final  Culture, respiratory (NON-Expectorated)     Status: None (Preliminary result)   Collection Time: 03/07/16  7:57 AM  Result Value Ref Range Status   Specimen Description SPUTUM  Final   Special Requests NONE Reflexed from [id number]  Final   Gram Stain   Final    RARE WBC PRESENT, PREDOMINANTLY PMN FEW SQUAMOUS EPITHELIAL CELLS PRESENT RARE GRAM VARIABLE COCCI    Culture PENDING  Incomplete   Report Status PENDING  Incomplete    RADIOLOGY STUDIES/RESULTS: Dg Chest 2 View  Result Date: 03/06/2016 CLINICAL DATA:  Congestion and fever for almost 10 days and not getting better. EXAM: CHEST  2 VIEW COMPARISON:  07/03/2014. FINDINGS: Cardiac silhouette is mildly enlarged. No mediastinal or hilar masses. No convincing adenopathy. Calcified nodes are noted along the left hilum. There calcified granuloma in the left upper lobe. Scarring or chronic subsegmental atelectasis, or a combination, is noted at the lung bases. Lungs are hyperexpanded but otherwise clear. No convincing pleural effusion.  No pneumothorax. Skeletal structures are  demineralized but grossly intact. IMPRESSION: No acute cardiopulmonary disease. Electronically Signed   By: Amie Portlandavid  Ormond M.D.   On: 03/06/2016 15:37     LOS: 1 day   Jeoffrey MassedGHIMIRE,Flora Parks, MD  [facility] Pager:[phone]  If 7PM-7AM, please contact night-coverage [email or web address] Password [email or web address] 03/07/2016, 1:56 PM

## 2016-03-07 NOTE — Progress Notes (Signed)
RT discussed with pt's family her concern with using Duoneb neb treatment since the pt's HR is running higher than normal. HR 124 currently.

## 2016-03-07 NOTE — Progress Notes (Signed)
Patient has had 3 loose/watery stools today.  MD notified.

## 2016-03-07 NOTE — Progress Notes (Signed)
Initial Nutrition Assessment  DOCUMENTATION CODES:   Not applicable  INTERVENTION:  Ensure Enlive po TID between meals, each supplement provides 350 kcal and 20 grams of protein.  Multivitamin with minerals daily.  NUTRITION DIAGNOSIS:   Inadequate oral intake related to acute illness, poor appetite as evidenced by meal completion < 50%, per patient/family report.  GOAL:   Patient will meet greater than or equal to 90% of their needs  MONITOR:   PO intake, Supplement acceptance, Labs, Weight trends, I & O's  REASON FOR ASSESSMENT:   Malnutrition Screening Tool    ASSESSMENT:   82 y.o. female with past medical of essential tremor admitted with approximately one-week history of intermittent low-grade fever, cough, nasal congestion, sore throat. Suspected acute bronchitis and UTI.    Spoke with patient's daughter. Patient receiving care at time of assessment. Patient has had poor appetite for 9-11 days while feeling sick. She has gotten very weak and has also developed loose stool. She still attempts some intake (today was able to have 25% of one meal and 1/2 bottle Ensure). Encouraged intake of adequate protein and calories to prevent weight loss. Discussed options that well be tolerated by patient.   Daughter reports patient's UBW is 136 lbs and has remained stable lately. Greater than 1.5 years ago patient had lost weight with PNA but had since regained it back.   Meal Completion: 0-25%  Medications reviewed and include: Oscal 1250 mg BID, ceftriaxone, multivitamin with minerals daily, NS @ 50 ml/hr.   Labs reviewed: Sodium 133, Chloride 98, Glucose 101.   Diet Order:  Diet Heart Room service appropriate? Yes; Fluid consistency: Thin  Skin:  Reviewed, no issues  Last BM:  03/07/2016  Height:   Ht Readings from Last 1 Encounters:  03/07/16 5\' 1"  (1.549 m)    Weight:   Wt Readings from Last 1 Encounters:  03/06/16 136 lb 11 oz (62 kg)    Ideal Body Weight:   47.7 kg  BMI:  Body mass index is 25.83 kg/m.  Estimated Nutritional Needs:   Kcal:  1400-1600  Protein:  65-80 grams  Fluid:  1.5 L/day  EDUCATION NEEDS:   No education needs identified at this time  Helane RimaLeanne Bannie Lobban, MS, RD, LDN Pager: [phone] After Hours Pager: [phone]

## 2016-03-07 NOTE — Progress Notes (Signed)
Spoke with daughter, Lisa [name], if patient is discharged and requiring skilled nursing facility, she would like [facility] to go [facility] to this admission[facility]

## 2016-03-08 DIAGNOSIS — Z90710 Acquired absence of both cervix and uterus: Secondary | ICD-10-CM | POA: Diagnosis not present

## 2016-03-08 DIAGNOSIS — G250 Essential tremor: Secondary | ICD-10-CM

## 2016-03-08 DIAGNOSIS — D649 Anemia, unspecified: Secondary | ICD-10-CM | POA: Diagnosis present

## 2016-03-08 DIAGNOSIS — Z8249 Family history of ischemic heart disease and other diseases of the circulatory system: Secondary | ICD-10-CM | POA: Diagnosis not present

## 2016-03-08 DIAGNOSIS — Z7982 Long term (current) use of aspirin: Secondary | ICD-10-CM | POA: Diagnosis not present

## 2016-03-08 DIAGNOSIS — Z888 Allergy status to other drugs, medicaments and biological substances status: Secondary | ICD-10-CM | POA: Diagnosis not present

## 2016-03-08 DIAGNOSIS — I10 Essential (primary) hypertension: Secondary | ICD-10-CM | POA: Diagnosis not present

## 2016-03-08 DIAGNOSIS — J9601 Acute respiratory failure with hypoxia: Secondary | ICD-10-CM

## 2016-03-08 DIAGNOSIS — I2510 Atherosclerotic heart disease of native coronary artery without angina pectoris: Secondary | ICD-10-CM | POA: Diagnosis not present

## 2016-03-08 DIAGNOSIS — E785 Hyperlipidemia, unspecified: Secondary | ICD-10-CM | POA: Diagnosis not present

## 2016-03-08 DIAGNOSIS — J209 Acute bronchitis, unspecified: Secondary | ICD-10-CM | POA: Diagnosis not present

## 2016-03-08 DIAGNOSIS — M810 Age-related osteoporosis without current pathological fracture: Secondary | ICD-10-CM | POA: Diagnosis not present

## 2016-03-08 DIAGNOSIS — R531 Weakness: Secondary | ICD-10-CM | POA: Diagnosis present

## 2016-03-08 DIAGNOSIS — Z79899 Other long term (current) drug therapy: Secondary | ICD-10-CM | POA: Diagnosis not present

## 2016-03-08 DIAGNOSIS — I6529 Occlusion and stenosis of unspecified carotid artery: Secondary | ICD-10-CM | POA: Diagnosis not present

## 2016-03-08 DIAGNOSIS — R197 Diarrhea, unspecified: Secondary | ICD-10-CM | POA: Diagnosis not present

## 2016-03-08 DIAGNOSIS — H409 Unspecified glaucoma: Secondary | ICD-10-CM | POA: Diagnosis not present

## 2016-03-08 DIAGNOSIS — N390 Urinary tract infection, site not specified: Principal | ICD-10-CM

## 2016-03-08 DIAGNOSIS — I739 Peripheral vascular disease, unspecified: Secondary | ICD-10-CM | POA: Diagnosis not present

## 2016-03-08 DIAGNOSIS — R627 Adult failure to thrive: Secondary | ICD-10-CM | POA: Diagnosis not present

## 2016-03-08 LAB — GASTROINTESTINAL PANEL BY PCR, STOOL (REPLACES STOOL CULTURE)

## 2016-03-08 LAB — CBC
HCT: 29.7 % — ABNORMAL LOW (ref 36.0–46.0)
Hemoglobin: 9.9 g/dL — ABNORMAL LOW (ref 12.0–15.0)
MCH: 32.2 pg (ref 26.0–34.0)
MCHC: 33.3 g/dL (ref 30.0–36.0)
MCV: 96.7 fL (ref 78.0–100.0)
Platelets: 271 10*3/uL (ref 150–400)
RBC: 3.07 MIL/uL — ABNORMAL LOW (ref 3.87–5.11)
RDW: 13.7 % (ref 11.5–15.5)
WBC: 8.8 10*3/uL (ref 4.0–10.5)

## 2016-03-08 LAB — BASIC METABOLIC PANEL
Anion gap: 6 (ref 5–15)
BUN: 7 mg/dL (ref 6–20)
CO2: 27 mmol/L (ref 22–32)
Calcium: 8.4 mg/dL — ABNORMAL LOW (ref 8.9–10.3)
Chloride: 102 mmol/L (ref 101–111)
Creatinine, Ser: 0.65 mg/dL (ref 0.44–1.00)
GFR calc Af Amer: >60 mL/min (ref >60)
GFR calc non Af Amer: >60 mL/min (ref >60)
Glucose, Bld: 106 mg/dL — ABNORMAL HIGH (ref 65–99)
Potassium: 4.6 mmol/L (ref 3.5–5.1)
Sodium: 135 mmol/L (ref 135–145)

## 2016-03-08 MED ORDER — SACCHAROMYCES BOULARDII 250 MG PO CAPS
250.0000 mg | ORAL_CAPSULE | Freq: Two times a day (BID) | ORAL | Status: DC
  Administered 2016-03-08 – 2016-03-10 (×5): 250 mg via ORAL
  Filled 2016-03-08 (×5): qty 1

## 2016-03-08 MED ORDER — ALPRAZOLAM 0.25 MG PO TABS
0.2500 mg | ORAL_TABLET | Freq: Two times a day (BID) | ORAL | Status: DC | PRN
  Administered 2016-03-08 – 2016-03-09 (×2): 0.25 mg via ORAL
  Filled 2016-03-08 (×2): qty 1

## 2016-03-08 NOTE — Progress Notes (Addendum)
Patient ID: Lisa CanardNancy O [name], female   DOB: 07-Jul-1923, 83 y.o.   MRN: [id number]  PROGRESS NOTE    Lisa Canardancy O Mabin  [name]:[id number] DOB: 07-Jul-1923 DOA: 03/06/2016  PCP: Gaye AlkenBARNES,[name], MD   Brief Narrative:  83 y.o. female with past medical history of tremor, carotid artery stenosis, dyslipidemia who presented with one-week history of intermittent low-grade fever, cough, nasal congestion, sore throat.  She was found to have UTI and possible acute bronchitis.   Assessment & Plan:  Acute respiratory failure with hypoxia / Possible acute viral bronchitis - Respiratory status better this am but pt still coughing - CXR without acute cardiopulmonary findings  - Influenza is negative - Continue xopenex nebulizer BID scheduled - Follow up resp cx results  - Continue mucinex BID  UTI - Continue Rocephin - Urine cx not obtained at the time of the admission to guide abx management   Diarrhea - Has had diarrhea after abx received in ED - Diarrhea since then completely resolved - GI viral panel ordered   Failure to thrive in adult - Appreciate nutrition seeing the pt in consultation  - Continue nutritional supplementation   Normocytic anemia - Hgb stable at 9.9 - No reports of bleeding   Deconditioning - Secondary to acute illness - PT eval pending   Essential tremor - Continue primidone and propranolol.  Dyslipidemia - Continue Vytorin.  Asymptomatic carotid artery stenosis without infarction - Continue outpatient follow-up with vascular surgery.    DVT prophylaxis: Lovenox subQ Code Status: Partial Family Communication: Daughter at the bedside  Disposition Plan: home likely in next 24-48 hours if she feels better, she is from [facility] and may require SNF placement    Consultants:   PT  Nutrition   Procedures:  None   Antimicrobials:   Rocephin 03/06/2016 -->    Subjective: Says she feels better this am, no overnight events.    Objective: Vitals:   03/07/16 1810 03/07/16 2146 03/08/16 0514 03/08/16 0940  BP: (!) 125/51 (!) 139/51 96/82 126/67  Pulse: 95 (!) 105 99 (!) 126  Resp: 16 18 16 14   Temp: 99.5 F (37.5 C) 99.3 F (37.4 C) 99.8 F (37.7 C) 98.6 F (37 C)  TempSrc: Oral Oral  Oral  SpO2: 94% 97% 93% 97%  Weight:  64.1 kg (141 lb 5 oz)    Height:        Intake/Output Summary (Last 24 hours) at 03/08/16 1233 Last data filed at 03/08/16 0941  Gross per 24 hour  Intake          1408.33 ml  Output             1751 ml  Net          -342.67 ml   Filed Weights   03/06/16 1449 03/06/16 2233 03/07/16 2146  Weight: 61.7 kg (136 lb) 62 kg (136 lb 11 oz) 64.1 kg (141 lb 5 oz)    Examination:  General exam: Appears calm and comfortable  Respiratory system: Clear to auscultation. Respiratory effort normal. Cardiovascular system: S1 & S2 heard, Rate controlled  Gastrointestinal system: Abdomen is nondistended, soft and nontender. No organomegaly or masses felt. Normal bowel sounds heard. Central nervous system: No focal neurological deficits. Extremities: Symmetric 5 x 5 power. Skin: No rashes, lesions or ulcers Psychiatry: Judgement and insight appear normal. Mood & affect appropriate.   Data Reviewed: I have personally reviewed following labs and imaging studies  CBC:  Recent Labs Lab 03/06/16 1556 03/07/16 0251 03/08/16  0736  WBC 8.7 9.0 8.8  NEUTROABS 7.5  --   --   HGB 10.6* 10.1* 9.9*  HCT 30.7* 29.5* 29.7*  MCV 95.9 94.6 96.7  PLT 282 260 271   Basic Metabolic Panel:  Recent Labs Lab 03/06/16 1556 03/07/16 0251 03/08/16 0736  NA 131* 133* 135  K 4.6 4.2 4.6  CL 97* 98* 102  CO2 26 27 27   GLUCOSE 119* 101* 106*  BUN 11 7 7   CREATININE 0.63 0.66 0.65  CALCIUM 8.8* 8.6* 8.4*  MG  --  2.1  --    GFR: Estimated Creatinine Clearance: 40.1 mL/min (by C-G formula based on SCr of 0.65 mg/dL). Liver Function Tests:  Recent Labs Lab 03/06/16 1556 03/07/16 0251  AST 56*  45*  ALT 69* 59*  ALKPHOS 82 78  BILITOT 0.3 0.5  PROT 6.2* 5.4*  ALBUMIN 3.0* 2.3*   No results for input(s): LIPASE, AMYLASE in the last 168 hours. No results for input(s): AMMONIA in the last 168 hours. Coagulation Profile: No results for input(s): INR, PROTIME in the last 168 hours. Cardiac Enzymes: No results for input(s): CKTOTAL, CKMB, CKMBINDEX, TROPONINI in the last 168 hours. BNP (last 3 results) No results for input(s): PROBNP in the last 8760 hours. HbA1C: No results for input(s): HGBA1C in the last 72 hours. CBG: No results for input(s): GLUCAP in the last 168 hours. Lipid Profile: No results for input(s): CHOL, HDL, LDLCALC, TRIG, CHOLHDL, LDLDIRECT in the last 72 hours. Thyroid Function Tests:  Recent Labs  03/07/16 0251  TSH 0.666   Anemia Panel: No results for input(s): VITAMINB12, FOLATE, FERRITIN, TIBC, IRON, RETICCTPCT in the last 72 hours. Urine analysis:    Component Value Date/Time   COLORURINE YELLOW 03/06/2016 1535   APPEARANCEUR CLOUDY (A) 03/06/2016 1535   LABSPEC 1.029 03/06/2016 1535   PHURINE 5.5 03/06/2016 1535   GLUCOSEU NEGATIVE 03/06/2016 1535   HGBUR NEGATIVE 03/06/2016 1535   BILIRUBINUR SMALL (A) 03/06/2016 1535   BILIRUBINUR small 11/28/2011 1136   KETONESUR NEGATIVE 03/06/2016 1535   PROTEINUR NEGATIVE 03/06/2016 1535   UROBILINOGEN 0.2 07/03/2014 1941   NITRITE NEGATIVE 03/06/2016 1535   LEUKOCYTESUR TRACE (A) 03/06/2016 1535   Sepsis Labs: @LABRCNTIP (procalcitonin:4,lacticidven:4)   Recent Results (from the past 240 hour(s))  Culture, sputum-assessment     Status: None   Collection Time: 03/07/16  7:57 AM  Result Value Ref Range Status   Specimen Description SPUTUM  Final   Special Requests NONE  Final   Sputum evaluation THIS SPECIMEN IS ACCEPTABLE FOR SPUTUM CULTURE  Final   Report Status 03/07/2016 FINAL  Final  Culture, respiratory (NON-Expectorated)     Status: None (Preliminary result)   Collection Time:  03/07/16  7:57 AM  Result Value Ref Range Status   Specimen Description SPUTUM  Final   Special Requests NONE Reflexed from [id number]  Final   Gram Stain   Final    RARE WBC PRESENT, PREDOMINANTLY PMN FEW SQUAMOUS EPITHELIAL CELLS PRESENT RARE GRAM VARIABLE COCCI    Culture CULTURE REINCUBATED FOR BETTER GROWTH  Final   Report Status PENDING  Incomplete      Radiology Studies: Dg Chest 2 View  Result Date: 12/30/2017No acute cardiopulmonary disease. Electronically Signed   By: Amie Portland M.D.   On: 03/06/2016 15:37      Scheduled Meds: . aspirin EC  81 mg Oral Daily  . calcium carbonate  1,250 mg Oral BID WC  . cefTRIAXone (ROCEPHIN)  IV  1 g  Intravenous Q24H  . enoxaparin (LOVENOX) injection  40 mg Subcutaneous Q24H  . ezetimibe-simvastatin  1 tablet Oral Daily  . feeding supplement (ENSURE ENLIVE)  237 mL Oral TID BM  . guaiFENesin  1,200 mg Oral BID  . levalbuterol  0.63 mg Nebulization BID  . multivitamin with minerals  1 tablet Oral Daily  . primidone  150 mg Oral QHS  . propranolol  10 mg Oral TID  . raloxifene  60 mg Oral QHS  . saccharomyces boulardii  250 mg Oral BID  . timolol  1 drop Both Eyes Daily   Continuous Infusions: . sodium chloride 50 mL/hr at 03/08/16 1224     LOS: 1 day    Time spent: 25 minutes  Greater than 50% of the time spent on counseling and coordinating the care.   Manson Passey, MD [facility] Pager [phone]  If 7PM-7AM, please contact night-coverage [email or web address] Password [email or web address] 03/08/2016, 12:33 PM

## 2016-03-08 NOTE — Evaluation (Signed)
Physical Therapy Evaluation Patient Details Name: Lisa [name] O Raska MRN: [id number] DOB: 1924/02/04 Today's Date: 03/08/2016   History of Present Illness  83 y.o. female with past medical history of tremor, carotid artery stenosis, dyslipidemia who presented with one-week history of intermittent low-grade fever, cough, nasal congestion, sore throat.  She was found to have UTI and possible acute bronchitis.  Clinical Impression  Pt admitted with/for above complications.  Pt is at a min assist level presently.  Pt currently limited functionally due to the problems listed. ( See problems list.)   Pt will benefit from PT to maximize function and safety in order to get ready for next venue listed below.     Follow Up Recommendations SNF    Equipment Recommendations  None recommended by PT    Recommendations for Other Services       Precautions / Restrictions Precautions Precautions: Fall      Mobility  Bed Mobility Overal bed mobility: Needs Assistance Bed Mobility: Supine to Sit;Sit to Supine     Supine to sit: Min guard Sit to supine: Min guard      Transfers Overall transfer level: Needs assistance   Transfers: Sit to/from Stand Sit to Stand: Min assist         General transfer comment: min stability assist  Ambulation/Gait Ambulation/Gait assistance: Min assist Ambulation Distance (Feet): 35 Feet Assistive device: Rolling walker (2 wheeled) Gait Pattern/deviations: Step-through pattern   Gait velocity interpretation: Below normal speed for age/gender General Gait Details: generally steady gait, but noticeable fatigue.  Stairs            Wheelchair Mobility    Modified Rankin (Stroke Patients Only)       Balance Overall balance assessment: Needs assistance Sitting-balance support: No upper extremity supported Sitting balance-Leahy Scale: Fair     Standing balance support: No upper extremity supported Standing balance-Leahy Scale: Fair Standing  balance comment: statically only                             Pertinent Vitals/Pain Pain Assessment: Faces Faces Pain Scale: No hurt    Home Living Family/patient expects to be discharged to:: Private residence ([facility]) Living Arrangements: Alone Available Help at Discharge: Family;Other (Comment) (staff) Type of Home: Independent living facility       Home Layout: One level Home Equipment: Walker - 4 wheels;Shower seat;Grab bars - tub/shower;Grab bars - toilet      Prior Function Level of Independence: Independent with assistive device(s)               Hand Dominance        Extremity/Trunk Assessment   Upper Extremity Assessment Upper Extremity Assessment: Generalized weakness;Overall [facility] tasks assessed (carpal tunnel with numbness bil)    Lower Extremity Assessment Lower Extremity Assessment: Overall WFL for tasks assessed (bil proximal weakness)    Cervical / Trunk Assessment Cervical / Trunk Assessment: Kyphotic  Communication   Communication: No difficulties  Cognition Arousal/Alertness: Awake/alert Behavior During Therapy: WFL for tasks assessed/performed Overall Cognitive Status: Within Functional Limits for tasks assessed                      General Comments      Exercises     Assessment/Plan    PT Assessment Patient needs continued PT services  PT Problem List Decreased strength;Decreased activity tolerance;Decreased balance;Decreased mobility;Cardiopulmonary status limiting activity  PT Treatment Interventions Gait training;Functional mobility training;Therapeutic activities;Patient/family education;Balance training;DME instruction    PT Goals (Current goals can be found in the Care Plan section)  Acute Rehab PT Goals Patient Stated Goal: get back to my independently living apartment PT Goal Formulation: With patient Time For Goal Achievement: 03/15/16 Potential to Achieve Goals: Good     Frequency Min 3X/week   Barriers to discharge        Co-evaluation               End of Session   Activity Tolerance: Patient tolerated treatment well Patient left: in bed;with call bell/phone within reach;with bed alarm set Nurse Communication: Mobility status    Functional Assessment Tool Used: clinical judgement Functional Limitation: Mobility: Walking and moving around Mobility: Walking and Moving Around Current Status (Z6109): At least 1 percent but less than 20 percent impaired, limited or restricted Mobility: Walking and Moving Around Goal Status [phone]): At least 1 percent but less than 20 percent impaired, limited or restricted    Time: 0981-1914 PT Time Calculation (min) (ACUTE ONLY): 43 min   Charges:   PT Evaluation $PT Eval Moderate Complexity: 1 Procedure PT Treatments $Gait Training: 8-22 mins $Therapeutic Activity: 8-22 mins   PT G Codes:   PT G-Codes **NOT FOR INPATIENT CLASS** Functional Assessment Tool Used: clinical judgement Functional Limitation: Mobility: Walking and moving around Mobility: Walking and Moving Around Current Status (N8295): At least 1 percent but less than 20 percent impaired, limited or restricted Mobility: Walking and Moving Around Goal Status [phone]): At least 1 percent but less than 20 percent impaired, limited or restricted    Kate Sable 03/08/2016, 4:50 PM 03/08/2016  [place] Bing, PT [phone]  (pager)

## 2016-03-09 DIAGNOSIS — J9601 Acute respiratory failure with hypoxia: Secondary | ICD-10-CM | POA: Diagnosis not present

## 2016-03-09 DIAGNOSIS — E785 Hyperlipidemia, unspecified: Secondary | ICD-10-CM

## 2016-03-09 DIAGNOSIS — J209 Acute bronchitis, unspecified: Secondary | ICD-10-CM

## 2016-03-09 DIAGNOSIS — N390 Urinary tract infection, site not specified: Secondary | ICD-10-CM | POA: Diagnosis not present

## 2016-03-09 DIAGNOSIS — R531 Weakness: Secondary | ICD-10-CM | POA: Diagnosis not present

## 2016-03-09 LAB — CULTURE, RESPIRATORY W GRAM STAIN: Culture: NORMAL

## 2016-03-09 NOTE — Progress Notes (Signed)
Patient ID: Lisa [name], female   DOB: [birth year], 83 y.o.   MRN: [id number]  PROGRESS NOTE    Lisa [name]  WUJ:[id number] DOB: [birth year] DOA: 03/06/2016  PCP: [name], MD   Brief Narrative:  83 y.o. female with past medical history of tremor, carotid artery stenosis, dyslipidemia who presented with one-week history of intermittent low-grade fever, cough, nasal congestion, sore throat.  She was found to have UTI and possible acute bronchitis.   Assessment & Plan:  Acute respiratory failure with hypoxia / Possible acute viral bronchitis - Respiratory status stable - CXR without acute cardiopulmonary findings  - Influenza is negative - Continue xopenex nebulizer BID scheduled - Continue mucinex BID  UTI - Continue Rocephin - Urine cx not obtained at the time of the admission to guide abx management   Diarrhea - Has had diarrhea after abx received in ED - Diarrhea since then completely resolved - GI viral panel negative   Failure to thrive in adult - Appreciate nutrition seeing the pt in consultation  - Continue nutritional supplementation   Normocytic anemia - Hgb stable at 9.9 - No reports of bleeding   Deconditioning - Secondary to acute illness - PT eval - recommended SNF placement   Essential tremor - Continue primidone and propranolol.  Dyslipidemia - Continue Vytorin.  Asymptomatic carotid artery stenosis without infarction - Continue outpatient follow-up with vascular surgery.    DVT prophylaxis: Lovenox subQ Code Status: Partial Family Communication: Daughter at the bedside  Disposition Plan: to SNF likely in am   Consultants:   PT  Nutrition   Procedures:  None   Antimicrobials:   Rocephin 03/06/2016 -->    Subjective: Feels better this am.  Objective: Vitals:   03/08/16 2122 03/09/16 0459 03/09/16 0500 03/09/16 1100  BP: (!) 102/52 (!) 111/41  (!) 111/43  Pulse: 76 89  88  Resp:  16  16  Temp: 99  F (37.2 C) 98.8 F (37.1 C)  98.8 F (37.1 C)  TempSrc: Oral Oral  Oral  SpO2: 99% 97%  97%  Weight:   64.1 kg (141 lb 5 oz)   Height:        Intake/Output Summary (Last 24 hours) at 03/09/16 1154 Last data filed at 03/09/16 0900  Gross per 24 hour  Intake             1120 ml  Output             1377 ml  Net             -257 ml   Filed Weights   03/06/16 2233 03/07/16 2146 03/09/16 0500  Weight: 62 kg (136 lb 11 oz) 64.1 kg (141 lb 5 oz) 64.1 kg (141 lb 5 oz)    Examination:  General exam: Appears calm and comfortable, no distress  Respiratory system: No wheezing, no rhonchi Cardiovascular system: S1 & S2 heard, RRR Gastrointestinal system: (+) BS, non tender  Central nervous system: Nonfocal  Extremities: LE pulses palpable  Skin: skin is warm and dry Psychiatry: Normal mood and behavior   Data Reviewed: I have personally reviewed following labs and imaging studies  CBC:  Recent Labs Lab 03/06/16 1556 03/07/16 0251 03/08/16 0736  WBC 8.7 9.0 8.8  NEUTROABS 7.5  --   --   HGB 10.6* 10.1* 9.9*  HCT 30.7* 29.5* 29.7*  MCV 95.9 94.6 96.7  PLT 282 260 271   Basic Metabolic Panel:  Recent Labs Lab 03/06/16 1556 03/07/16  0251 03/08/16 0736  NA 131* 133* 135  K 4.6 4.2 4.6  CL 97* 98* 102  CO2 26 27 27   GLUCOSE 119* 101* 106*  BUN 11 7 7   CREATININE 0.63 0.66 0.65  CALCIUM 8.8* 8.6* 8.4*  MG  --  2.1  --    GFR: Estimated Creatinine Clearance: 40.1 mL/min (by C-G formula based on SCr of 0.65 mg/dL). Liver Function Tests:  Recent Labs Lab 03/06/16 1556 03/07/16 0251  AST 56* 45*  ALT 69* 59*  ALKPHOS 82 78  BILITOT 0.3 0.5  PROT 6.2* 5.4*  ALBUMIN 3.0* 2.3*   No results for input(s): LIPASE, AMYLASE in the last 168 hours. No results for input(s): AMMONIA in the last 168 hours. Coagulation Profile: No results for input(s): INR, PROTIME in the last 168 hours. Cardiac Enzymes: No results for input(s): CKTOTAL, CKMB, CKMBINDEX, TROPONINI in  the last 168 hours. BNP (last 3 results) No results for input(s): PROBNP in the last 8760 hours. HbA1C: No results for input(s): HGBA1C in the last 72 hours. CBG: No results for input(s): GLUCAP in the last 168 hours. Lipid Profile: No results for input(s): CHOL, HDL, LDLCALC, TRIG, CHOLHDL, LDLDIRECT in the last 72 hours. Thyroid Function Tests:  Recent Labs  03/07/16 0251  TSH 0.666   Anemia Panel: No results for input(s): VITAMINB12, FOLATE, FERRITIN, TIBC, IRON, RETICCTPCT in the last 72 hours. Urine analysis:    Component Value Date/Time   COLORURINE YELLOW 03/06/2016 1535   APPEARANCEUR CLOUDY (A) 03/06/2016 1535   LABSPEC 1.029 03/06/2016 1535   PHURINE 5.5 03/06/2016 1535   GLUCOSEU NEGATIVE 03/06/2016 1535   HGBUR NEGATIVE 03/06/2016 1535   BILIRUBINUR SMALL (A) 03/06/2016 1535   BILIRUBINUR small 11/28/2011 1136   KETONESUR NEGATIVE 03/06/2016 1535   PROTEINUR NEGATIVE 03/06/2016 1535   UROBILINOGEN 0.2 07/03/2014 1941   NITRITE NEGATIVE 03/06/2016 1535   LEUKOCYTESUR TRACE (A) 03/06/2016 1535   Sepsis Labs: @LABRCNTIP (procalcitonin:4,lacticidven:4)   Recent Results (from the past 240 hour(s))  Culture, sputum-assessment     Status: None   Collection Time: 03/07/16  7:57 AM  Result Value Ref Range Status   Specimen Description SPUTUM  Final   Special Requests NONE  Final   Sputum evaluation THIS SPECIMEN IS ACCEPTABLE FOR SPUTUM CULTURE  Final   Report Status 03/07/2016 FINAL  Final  Culture, respiratory (NON-Expectorated)     Status: None   Collection Time: 03/07/16  7:57 AM  Result Value Ref Range Status   Specimen Description SPUTUM  Final   Special Requests NONE Reflexed from [id number]  Final   Gram Stain   Final    RARE WBC PRESENT, PREDOMINANTLY PMN FEW SQUAMOUS EPITHELIAL CELLS PRESENT RARE GRAM VARIABLE COCCI    Culture Consistent with normal respiratory flora.  Final   Report Status 03/09/2016 FINAL  Final  Gastrointestinal Panel by PCR ,  Stool     Status: None   Collection Time: 03/07/16  8:25 PM  Result Value Ref Range Status   Campylobacter species NOT DETECTED NOT DETECTED Final   Plesimonas shigelloides NOT DETECTED NOT DETECTED Final   Salmonella species NOT DETECTED NOT DETECTED Final   Yersinia enterocolitica NOT DETECTED NOT DETECTED Final   Vibrio species NOT DETECTED NOT DETECTED Final   Vibrio cholerae NOT DETECTED NOT DETECTED Final   Enteroaggregative E coli (EAEC) NOT DETECTED NOT DETECTED Final   Enteropathogenic E coli (EPEC) NOT DETECTED NOT DETECTED Final   Enterotoxigenic E coli (ETEC) NOT DETECTED NOT DETECTED Final  Shiga like toxin producing E coli (STEC) NOT DETECTED NOT DETECTED Final   Shigella/Enteroinvasive E coli (EIEC) NOT DETECTED NOT DETECTED Final   Cryptosporidium NOT DETECTED NOT DETECTED Final   Cyclospora cayetanensis NOT DETECTED NOT DETECTED Final   Entamoeba histolytica NOT DETECTED NOT DETECTED Final   Giardia lamblia NOT DETECTED NOT DETECTED Final   Adenovirus F40/41 NOT DETECTED NOT DETECTED Final   Astrovirus NOT DETECTED NOT DETECTED Final   Norovirus GI/GII NOT DETECTED NOT DETECTED Final   Rotavirus A NOT DETECTED NOT DETECTED Final   Sapovirus (I, II, IV, and V) NOT DETECTED NOT DETECTED Final      Radiology Studies: Dg Chest 2 View Result Date: 12/30/2017No acute cardiopulmonary disease. Electronically Signed   By: Amie Portlandavid  Ormond M.D.   On: 03/06/2016 15:37      Scheduled Meds: . aspirin EC  81 mg Oral Daily  . calcium carbonate  1,250 mg Oral BID WC  . cefTRIAXone (ROCEPHIN)  IV  1 g Intravenous Q24H  . enoxaparin (LOVENOX) injection  40 mg Subcutaneous Q24H  . ezetimibe-simvastatin  1 tablet Oral Daily  . feeding supplement (ENSURE ENLIVE)  237 mL Oral TID BM  . guaiFENesin  1,200 mg Oral BID  . levalbuterol  0.63 mg Nebulization BID  . multivitamin with minerals  1 tablet Oral Daily  . primidone  150 mg Oral QHS  . propranolol  10 mg Oral TID  .  raloxifene  60 mg Oral QHS  . saccharomyces boulardii  250 mg Oral BID  . timolol  1 drop Both Eyes Daily   Continuous Infusions: . sodium chloride 50 mL/hr at 03/09/16 1015     LOS: 1 day    Time spent: 25 minutes  Greater than 50% of the time spent on counseling and coordinating the care.   Manson PasseyEVINE, Lisa Ury, MD [facility] Pager [phone]  If 7PM-7AM, please contact night-coverage [email or web address] Password [email or web address] 03/09/2016, 11:54 AM

## 2016-03-09 NOTE — Care Management Obs Status (Signed)
MEDICARE OBSERVATION STATUS NOTIFICATION   Patient Details  Name: Lisa [name] MRN: [id number] Date of Birth: 1923-07-12   Medicare Observation Status Notification Given:  Yes    Hali Balgobin, Annamarie MajorCheryl U, RN 03/09/2016, 11:52 AM

## 2016-03-10 DIAGNOSIS — M25561 Pain in right knee: Secondary | ICD-10-CM | POA: Diagnosis not present

## 2016-03-10 DIAGNOSIS — F411 Generalized anxiety disorder: Secondary | ICD-10-CM | POA: Diagnosis not present

## 2016-03-10 DIAGNOSIS — H9193 Unspecified hearing loss, bilateral: Secondary | ICD-10-CM | POA: Diagnosis not present

## 2016-03-10 DIAGNOSIS — N390 Urinary tract infection, site not specified: Secondary | ICD-10-CM | POA: Diagnosis not present

## 2016-03-10 DIAGNOSIS — R55 Syncope and collapse: Secondary | ICD-10-CM | POA: Diagnosis not present

## 2016-03-10 DIAGNOSIS — G5603 Carpal tunnel syndrome, bilateral upper limbs: Secondary | ICD-10-CM | POA: Diagnosis not present

## 2016-03-10 DIAGNOSIS — M25562 Pain in left knee: Secondary | ICD-10-CM | POA: Diagnosis not present

## 2016-03-10 DIAGNOSIS — D649 Anemia, unspecified: Secondary | ICD-10-CM | POA: Diagnosis not present

## 2016-03-10 DIAGNOSIS — E871 Hypo-osmolality and hyponatremia: Secondary | ICD-10-CM | POA: Diagnosis not present

## 2016-03-10 DIAGNOSIS — R41841 Cognitive communication deficit: Secondary | ICD-10-CM | POA: Diagnosis not present

## 2016-03-10 DIAGNOSIS — M810 Age-related osteoporosis without current pathological fracture: Secondary | ICD-10-CM | POA: Diagnosis not present

## 2016-03-10 DIAGNOSIS — J209 Acute bronchitis, unspecified: Secondary | ICD-10-CM | POA: Diagnosis not present

## 2016-03-10 DIAGNOSIS — G8929 Other chronic pain: Secondary | ICD-10-CM | POA: Diagnosis not present

## 2016-03-10 DIAGNOSIS — B372 Candidiasis of skin and nail: Secondary | ICD-10-CM | POA: Diagnosis not present

## 2016-03-10 DIAGNOSIS — R531 Weakness: Secondary | ICD-10-CM | POA: Diagnosis not present

## 2016-03-10 DIAGNOSIS — R1311 Dysphagia, oral phase: Secondary | ICD-10-CM | POA: Diagnosis not present

## 2016-03-10 DIAGNOSIS — G250 Essential tremor: Secondary | ICD-10-CM | POA: Diagnosis not present

## 2016-03-10 DIAGNOSIS — R21 Rash and other nonspecific skin eruption: Secondary | ICD-10-CM | POA: Diagnosis not present

## 2016-03-10 DIAGNOSIS — J9601 Acute respiratory failure with hypoxia: Secondary | ICD-10-CM | POA: Diagnosis not present

## 2016-03-10 DIAGNOSIS — R2681 Unsteadiness on feet: Secondary | ICD-10-CM | POA: Diagnosis not present

## 2016-03-10 DIAGNOSIS — J989 Respiratory disorder, unspecified: Secondary | ICD-10-CM | POA: Diagnosis not present

## 2016-03-10 DIAGNOSIS — M6281 Muscle weakness (generalized): Secondary | ICD-10-CM | POA: Diagnosis not present

## 2016-03-10 DIAGNOSIS — R32 Unspecified urinary incontinence: Secondary | ICD-10-CM | POA: Diagnosis not present

## 2016-03-10 DIAGNOSIS — G2581 Restless legs syndrome: Secondary | ICD-10-CM | POA: Diagnosis not present

## 2016-03-10 DIAGNOSIS — R509 Fever, unspecified: Secondary | ICD-10-CM | POA: Diagnosis not present

## 2016-03-10 DIAGNOSIS — E785 Hyperlipidemia, unspecified: Secondary | ICD-10-CM | POA: Diagnosis not present

## 2016-03-10 DIAGNOSIS — N3946 Mixed incontinence: Secondary | ICD-10-CM | POA: Diagnosis not present

## 2016-03-10 MED ORDER — CIPROFLOXACIN HCL 500 MG PO TABS: 500.0000 mg | ORAL_TABLET | Freq: Two times a day (BID) | ORAL | 0 refills | Status: DC

## 2016-03-10 MED ORDER — LOPERAMIDE HCL 2 MG PO CAPS: 2.0000 mg | ORAL_CAPSULE | Freq: Two times a day (BID) | ORAL | 0 refills | Status: DC | PRN

## 2016-03-10 MED ORDER — GUAIFENESIN ER 600 MG PO TB12: 600.0000 mg | ORAL_TABLET | Freq: Two times a day (BID) | ORAL | 0 refills | Status: DC | PRN

## 2016-03-10 MED ORDER — ACETAMINOPHEN 325 MG PO TABS: 650.0000 mg | ORAL_TABLET | Freq: Four times a day (QID) | ORAL | 0 refills | Status: DC | PRN

## 2016-03-10 MED ORDER — ENSURE ENLIVE PO LIQD: 237.0000 mL | Freq: Three times a day (TID) | ORAL | 12 refills | Status: DC

## 2016-03-10 MED ORDER — DEXTROSE 5 % IV SOLN
1.0000 g | INTRAVENOUS | Status: DC
  Administered 2016-03-10: 1 g via INTRAVENOUS
  Filled 2016-03-10 (×2): qty 10

## 2016-03-10 MED ORDER — SACCHAROMYCES BOULARDII 250 MG PO CAPS: 250.0000 mg | ORAL_CAPSULE | Freq: Two times a day (BID) | ORAL | 0 refills | Status: DC

## 2016-03-10 MED ORDER — ALPRAZOLAM 0.25 MG PO TABS: 0.2500 mg | ORAL_TABLET | Freq: Two times a day (BID) | ORAL | 0 refills | Status: DC | PRN

## 2016-03-10 MED ORDER — LOPERAMIDE HCL 2 MG PO CAPS: 2.0000 mg | ORAL_CAPSULE | Freq: Two times a day (BID) | ORAL | Status: DC | PRN

## 2016-03-10 NOTE — NC FL2 (Addendum)
[place] MEDICAID FL2 LEVEL OF CARE SCREENING TOOL     IDENTIFICATION  Patient Name: Lisa [name] Birthdate: 12/31/1923 Sex: female Admission Date (Current Location): 03/06/2016  [facility] [place]   Facility and Address:  [facility], [address], [place], [place] [address]      Provider Number: [id number]  Attending Physician Name and Address:  Alison MurrayAlma M Young Brim, MD  Relative Name and Phone Number:  Elna BreslowForsythe,[name]-Daughter; [phone] (home),  [phone] (mobile)     Current Level of Care: Hospital Recommended Level of Care: Skilled Nursing Facility Prior Approval Number:    Date Approved/Denied:   PASRR Number: [id number]  Discharge Plan: SNF ([facility] skilled facility)    Current Diagnoses: Patient Active Problem List   Diagnosis Date Noted  . Acute bronchitis 03/08/2016  . Normocytic anemia 03/08/2016  . Dyslipidemia 03/08/2016  . Urinary tract infection without hematuria 03/06/2016  . Essential tremor 10/12/2015  . Acute respiratory failure with hypoxia (HCC) 07/04/2014  . Asymptomatic carotid artery stenosis without infarction 12/20/2013    Orientation RESPIRATION BLADDER Height & Weight     Self  Normal Continent Weight: 142 lb 13.7 oz (64.8 kg) Height:  5\' 1"  (154.9 cm)  BEHAVIORAL SYMPTOMS/MOOD NEUROLOGICAL BOWEL NUTRITION STATUS      Continent Diet (Heart healthy)  AMBULATORY STATUS COMMUNICATION OF NEEDS Skin   Limited Assist Verbally Normal                       Personal Care Assistance Level of Assistance  Bathing, Feeding, Dressing Bathing Assistance: Limited assistance Feeding assistance: Independent Dressing Assistance: Limited assistance     Functional Limitations Info  Sight, Hearing, Speech Sight Info: Impaired Hearing Info: Impaired Speech Info: Adequate    SPECIAL CARE FACTORS FREQUENCY  PT (By licensed PT)     PT Frequency: Evaluated 1/1 and a minimum of  3X times per week therapy recommended              Contractures Contractures Info: Not present    Additional Factors Info  Code Status, Allergies Code Status Info: Partial Allergies Info: Mycostatin           Current Medications (03/10/2016):  This is the current hospital active medication list Current Facility-Administered Medications  Medication Dose Route Frequency Provider Last Rate Last Dose  . 0.9 %  sodium chloride infusion   Intravenous Continuous Maretta BeesShanker M Ghimire, MD 50 mL/hr at 03/10/16 0924 50 mL/hr at 03/10/16 0924  . acetaminophen (TYLENOL) tablet 650 mg  650 mg Oral Q6H PRN Kerrin Moobin Edwin, MD       Or  . acetaminophen (TYLENOL) suppository 650 mg  650 mg Rectal Q6H PRN Kerrin Moobin Edwin, MD      . ALPRAZolam Prudy Feeler(XANAX) tablet 0.25 mg  0.25 mg Oral BID PRN Alison MurrayAlma M Sujata Maines, MD   0.25 mg at 03/09/16 2151  . aspirin EC tablet 81 mg  81 mg Oral Daily Kerrin Moobin Edwin, MD   81 mg at 03/10/16 1100  . calcium carbonate (OS-CAL - dosed in mg of elemental calcium) tablet 1,250 mg  1,250 mg Oral BID WC Kerrin Moobin Edwin, MD   1,250 mg at 03/10/16 0818  . cefTRIAXone (ROCEPHIN) 1 g in dextrose 5 % 50 mL IVPB  1 g Intravenous Q24H Alison MurrayAlma M Theordore Cisnero, MD   1 g at 03/10/16 1452  . enoxaparin (LOVENOX) injection 40 mg  40 mg Subcutaneous Q24H Kerrin Moobin Edwin, MD   [phone]  mg at 03/10/16 1100  . ezetimibe-simvastatin (VYTORIN) 10-20 MG per tablet 1 tablet  1 tablet Oral Daily Kerrin Mo, MD   1 tablet at 03/10/16 1100  . feeding supplement (ENSURE ENLIVE) (ENSURE ENLIVE) liquid 237 mL  237 mL Oral TID BM Shanker Levora Dredge, MD   237 mL at 03/10/16 1100  . guaiFENesin (MUCINEX) 12 hr tablet 1,200 mg  1,200 mg Oral BID Maretta Bees, MD   1,200 mg at 03/10/16 1100  . levalbuterol (XOPENEX) nebulizer solution 0.63 mg  0.63 mg Nebulization BID Maretta Bees, MD   0.63 mg at 03/10/16 0946  . loperamide (IMODIUM) capsule 2 mg  2 mg Oral BID PRN Alison Murray, MD      . multivitamin with minerals tablet 1 tablet  1 tablet  Oral Daily Kerrin Mo, MD   1 tablet at 03/10/16 1100  . Naphazoline-Glycerin 0.03-0.5 % SOLN 1-2 drop  1-2 drop Ophthalmic QID PRN Maretta Bees, MD   1 drop at 03/08/16 1051  . ondansetron (ZOFRAN) tablet 4 mg  4 mg Oral Q6H PRN Kerrin Mo, MD       Or  . ondansetron [facility]) injection 4 mg  4 mg Intravenous Q6H PRN Kerrin Mo, MD      . polyvinyl alcohol (LIQUIFILM TEARS) 1.4 % ophthalmic solution 2 drop  2 drop Both Eyes PRN Shanker Levora Dredge, MD      . primidone (MYSOLINE) tablet 150 mg  150 mg Oral QHS Kerrin Mo, MD   150 mg at 03/09/16 2151  . propranolol (INDERAL) tablet 10 mg  10 mg Oral TID Kerrin Mo, MD   10 mg at 03/10/16 1059  . raloxifene (EVISTA) tablet 60 mg  60 mg Oral QHS Kerrin Mo, MD   60 mg at 03/09/16 2152  . saccharomyces boulardii (FLORASTOR) capsule 250 mg  250 mg Oral BID Alison Murray, MD   250 mg at 03/10/16 1100  . timolol (TIMOPTIC) 0.5 % ophthalmic solution 1 drop  1 drop Both Eyes Daily Kerrin Mo, MD   1 drop at 03/10/16 1100     Discharge Medications: Please see discharge summary for a list of discharge medications.  Relevant Imaging Results:  Relevant Lab Results:  Manson Passey, MD  Additional Information ss#[id number].  Cristobal Goldmann, LCSW

## 2016-03-10 NOTE — Discharge Instructions (Signed)
Urinary Tract Infection, Adult Introduction A urinary tract infection (UTI) is an infection of any part of the urinary tract. The urinary tract includes the:  Kidneys.  Ureters.  Bladder.  Urethra. These organs make, store, and get rid of pee (urine) in the body. Follow these instructions at home:  Take over-the-counter and prescription medicines only as told by your doctor.  If you were prescribed an antibiotic medicine, take it as told by your doctor. Do not stop taking the antibiotic even if you start to feel better.  Avoid the following drinks:  Alcohol.  Caffeine.  Tea.  Carbonated drinks.  Drink enough fluid to keep your pee clear or pale yellow.  Keep all follow-up visits as told by your doctor. This is important.  Make sure to:  Empty your bladder often and completely. Do not to hold pee for long periods of time.  Empty your bladder before and after sex.  Wipe from front to back after a bowel movement if you are female. Use each tissue one time when you wipe. Contact a doctor if:  You have back pain.  You have a fever.  You feel sick to your stomach (nauseous).  You throw up (vomit).  Your symptoms do not get better after 3 days.  Your symptoms go away and then come back. Get help right away if:  You have very bad back pain.  You have very bad lower belly (abdominal) pain.  You are throwing up and cannot keep down any medicines or water. This information is not intended to replace advice given to you by your health care provider. Make sure you discuss any questions you have with your health care provider. Document Released: 08/11/2007 Document Revised: 07/31/2015 Document Reviewed: 01/13/2015  2017 Elsevier  

## 2016-03-10 NOTE — Clinical Social Work Note (Signed)
Clinical Social Work Assessment  Patient Details  Name: Lisa [name] MRN: [id number] Date of Birth: 1923/07/15  Date of referral:  03/09/16               Reason for consult:  Facility Placement                Permission sought to share information with:  Family Supports Permission granted to share information::  No (Patient oriented only to person and place - talked with daughter)  Name::     Emmit AlexandersBarbara Forsythe  Agency::     Relationship::  Daughter  Contact Information:  [phone] - mobile  Housing/Transportation Living arrangements for the past 2 months:  Assisted Living Facility ([facility]) Source of Information:  Adult Children, Facility Patient Interpreter Needed:  None Criminal Activity/Legal Involvement Pertinent to Current Situation/Hospitalization:  No - Comment as needed Significant Relationships:  Adult Children Lives with:  Facility Resident ([facility]) Do you feel safe going back to the place where you live?  No (Patient will discharge to SNF before returning to her home) Need for family participation in patient care:  Yes (Comment)  Care giving concerns:  Daughter in agreement [facility] before patient returns to her home.   Social Worker assessment / plan:  CSW talked with patient's daughter Britta MccreedyBarbara at the bedside and she is in agreement with rehab before her mother [facility].  Employment status:  Retired Health and safety inspectornsurance information:  [employer] administrator[place] Medicare (BCBS) PT Recommendations:  Skilled Nursing Facility Information / Referral to community resources:  Other (Comment Required) ([facility] and will d/c to SNF before returning to her home)  Patient/Family's Response to care:  No concerns expressed by daughter regarding care during hospitalization.  Patient/Family's Understanding of and Emotional Response to Diagnosis, Current Treatment, and Prognosis:  Not discussed.  Emotional Assessment Appearance:  Appears stated age Attitude/Demeanor/Rapport:  Unable to Assess (Talked to daughter) Affect (typically observed):  Unable to Assess Orientation:  Oriented to Self, Oriented to Place Alcohol / Substance use:  Never Used Psych involvement (Current and /or in the community):  No (Comment)  Discharge Needs  Concerns to be addressed:  No discharge needs identified Readmission within the last 30 days:  No Current discharge risk:  None Barriers to Discharge:  No Barriers Identified   Cristobal GoldmannCrawford, Sanaiya Welliver Bradley, LCSW 03/10/2016, 6:35 PM

## 2016-03-10 NOTE — Discharge Summary (Addendum)
Physician Discharge Summary  Lisa [name] JXB:[id number] DOB: 09/24/1923 DOA: 03/06/2016  PCP: [name], MD  Admit date: 03/06/2016 Discharge date: 03/10/2016  Recommendations for Outpatient Follow-up:  1. Continue ciprofloxacin for 3 days on discharge 2. Continue physical therapy and OT at SNF  Discharge Diagnoses:  Principal Problem:   Acute respiratory failure with hypoxia (HCC) Active Problems:   Urinary tract infection without hematuria   Acute bronchitis   Asymptomatic carotid artery stenosis without infarction   Essential tremor   Normocytic anemia   Dyslipidemia    Discharge Condition: stable   Diet recommendation: as tolerated   History of present illness:  83 y.o.femalewith past medical history of tremor, carotid artery stenosis, dyslipidemia who presented with one-week history of intermittent low-grade fever, cough, nasal congestion, sore throat.  She was found to have UTI and possible acute bronchitis.  Hospital Course:     Assessment & Plan:  Acute respiratory failure with hypoxia / Possible acute viral bronchitis - Respiratory status stable - CXR without acute cardiopulmonary findings  - Influenza is negative - Continue mucinex BID PRN  UTI - Continue Rocephin - Urine cx not obtained at the time of the admission to guide abx management  - Continue cipro for 3 days on discharge   Diarrhea - Has had diarrhea after abx received in ED - Diarrhea since then completely resolved; still has loose stools about 2-3 episodes a day - Will add BID PRN imodium  - GI viral panel negative   Failure to thrive in adult - Appreciate nutrition seeing the pt in consultation  - Continue nutritional supplementation   Normocytic anemia - Hgb stable at 9.9 - No reports of bleeding   Deconditioning - Secondary to acute illness - PT eval - recommended SNF placement   Essential tremor - Continue primidone and  propranolol.  Dyslipidemia - Continue Vytorin.  Asymptomatic carotid artery stenosis without infarction - Continue outpatient follow-up with vascular surgery.    DVT prophylaxis: Lovenox subQ Code Status: Partial Family Communication: Daughter at the bedside     Consultants:   PT  Nutrition   Procedures:  None   Antimicrobials:   Rocephin 03/06/2016 --> 03/11/2015    Signed:  Manson Passey, MD  [facility] 03/10/2016, 10:22 AM  Pager #: [phone]  Time spent in minutes: more than 30 minutes   Discharge Exam: Vitals:   03/09/16 2100 03/10/16 0600  BP: (!) 129/40 (!) 128/53  Pulse: 90 82  Resp: 18 18  Temp: 98.3 F (36.8 C) 98.3 F (36.8 C)   Vitals:   03/09/16 1700 03/09/16 2100 03/09/16 2135 03/10/16 0600  BP: (!) 132/52 (!) 129/40  (!) 128/53  Pulse: 82 90  82  Resp: 18 18  18   Temp: 98.6 F (37 C) 98.3 F (36.8 C)  98.3 F (36.8 C)  TempSrc: Oral Oral  Oral  SpO2: 97% 95% 98% 98%  Weight:  64.8 kg (142 lb 13.7 oz)    Height:        General: Pt is alert, follows commands appropriately, not in acute distress Cardiovascular: Regular rate and rhythm, S1/S2 + Respiratory: Clear to auscultation bilaterally, no wheezing, no crackles, no rhonchi Abdominal: Soft, non tender, non distended, bowel sounds +, no guarding Extremities: no edema, no cyanosis, pulses palpable bilaterally DP and PT Neuro: Grossly nonfocal  Discharge Instructions  Discharge Instructions    Call MD for:  persistant nausea and vomiting    Complete by:  As directed    Call  MD for:  redness, tenderness, or signs of infection (pain, swelling, redness, odor or green/yellow discharge around incision site)    Complete by:  As directed    Call MD for:  severe uncontrolled pain    Complete by:  As directed    Diet - low sodium heart healthy    Complete by:  As directed    Discharge instructions    Complete by:  As directed    Continue cipro for 3 days on  discharge   Increase activity slowly    Complete by:  As directed      Allergies as of 03/10/2016      Reactions   Mycostatin [nystatin] Swelling, Rash      Medication List    TAKE these medications   acetaminophen 325 MG tablet Commonly known as:  TYLENOL Take 2 tablets (650 mg total) by mouth every 6 (six) hours as needed for mild pain (or Fever >/= 101).   ALPRAZolam 0.25 MG tablet Commonly known as:  XANAX Take 1 tablet (0.25 mg total) by mouth 2 (two) times daily as needed for anxiety.   aspirin EC 81 MG tablet Take 81 mg by mouth daily.   calcium carbonate 600 MG Tabs tablet Commonly known as:  OS-CAL Take 600 mg by mouth 2 (two) times daily with a meal.   ciprofloxacin 500 MG tablet Commonly known as:  CIPRO Take 1 tablet (500 mg total) by mouth 2 (two) times daily.   feeding supplement (ENSURE ENLIVE) Liqd Take 237 mLs by mouth 3 (three) times daily between meals.   guaiFENesin 600 MG 12 hr tablet BID PRN Commonly known as:  MUCINEX Take 600 mg by mouth 2 (two) times daily BID PRN   loperamide 2 MG capsule Commonly known as:  IMODIUM Take 1 capsule (2 mg total) by mouth 2 (two) times daily as needed for diarrhea or loose stools.   loratadine 10 MG tablet Commonly known as:  CLARITIN Take 10 mg by mouth daily.   multivitamin tablet Take 1 tablet by mouth daily.   primidone 50 MG tablet Commonly known as:  MYSOLINE Take 3 tablets (150 mg total) by mouth at bedtime.   propranolol 10 MG tablet Commonly known as:  INDERAL Take 2 tablets (20 mg total) by mouth 2 (two) times daily. What changed:  how much to take  when to take this   raloxifene 60 MG tablet Commonly known as:  EVISTA Take 60 mg by mouth at bedtime.   saccharomyces boulardii 250 MG capsule Commonly known as:  FLORASTOR Take 1 capsule (250 mg total) by mouth 2 (two) times daily.   timolol 0.5 % ophthalmic solution Commonly known as:  TIMOPTIC Place 1 drop into both eyes daily.    VYTORIN 10-20 MG tablet Generic drug:  ezetimibe-simvastatin Take 1 tablet by mouth daily.      Follow-up Information    [name], MD. Schedule an appointment as soon as possible for a visit in 2 week(s).   Specialty:  Family Medicine Contact information: [address] [place] [address] [phone]            The results of significant diagnostics from this hospitalization (including imaging, microbiology, ancillary and laboratory) are listed below for reference.    Significant Diagnostic Studies: Dg Chest 2 View  Result Date: 03/06/2016 CLINICAL DATA:  Congestion and fever for almost 10 days and not getting better. EXAM: CHEST  2 VIEW COMPARISON:  07/03/2014. FINDINGS: Cardiac silhouette is  mildly enlarged. No mediastinal or hilar masses. No convincing adenopathy. Calcified nodes are noted along the left hilum. There calcified granuloma in the left upper lobe. Scarring or chronic subsegmental atelectasis, or a combination, is noted at the lung bases. Lungs are hyperexpanded but otherwise clear. No convincing pleural effusion.  No pneumothorax. Skeletal structures are demineralized but grossly intact. IMPRESSION: No acute cardiopulmonary disease. Electronically Signed   By: Amie Portlandavid  Ormond M.D.   On: 03/06/2016 15:37    Microbiology: Recent Results (from the past 240 hour(s))  Culture, sputum-assessment     Status: None   Collection Time: 03/07/16  7:57 AM  Result Value Ref Range Status   Specimen Description SPUTUM  Final   Special Requests NONE  Final   Sputum evaluation THIS SPECIMEN IS ACCEPTABLE FOR SPUTUM CULTURE  Final   Report Status 03/07/2016 FINAL  Final  Culture, respiratory (NON-Expectorated)     Status: None   Collection Time: 03/07/16  7:57 AM  Result Value Ref Range Status   Specimen Description SPUTUM  Final   Special Requests NONE Reflexed from [id number]  Final   Gram Stain   Final    RARE WBC PRESENT, PREDOMINANTLY PMN FEW  SQUAMOUS EPITHELIAL CELLS PRESENT RARE GRAM VARIABLE COCCI    Culture Consistent with normal respiratory flora.  Final   Report Status 03/09/2016 FINAL  Final  Gastrointestinal Panel by PCR , Stool     Status: None   Collection Time: 03/07/16  8:25 PM  Result Value Ref Range Status   Campylobacter species NOT DETECTED NOT DETECTED Final   Plesimonas shigelloides NOT DETECTED NOT DETECTED Final   Salmonella species NOT DETECTED NOT DETECTED Final   Yersinia enterocolitica NOT DETECTED NOT DETECTED Final   Vibrio species NOT DETECTED NOT DETECTED Final   Vibrio cholerae NOT DETECTED NOT DETECTED Final   Enteroaggregative E coli (EAEC) NOT DETECTED NOT DETECTED Final   Enteropathogenic E coli (EPEC) NOT DETECTED NOT DETECTED Final   Enterotoxigenic E coli (ETEC) NOT DETECTED NOT DETECTED Final   Shiga like toxin producing E coli (STEC) NOT DETECTED NOT DETECTED Final   Shigella/Enteroinvasive E coli (EIEC) NOT DETECTED NOT DETECTED Final   Cryptosporidium NOT DETECTED NOT DETECTED Final   Cyclospora cayetanensis NOT DETECTED NOT DETECTED Final   Entamoeba histolytica NOT DETECTED NOT DETECTED Final   Giardia lamblia NOT DETECTED NOT DETECTED Final   Adenovirus F40/41 NOT DETECTED NOT DETECTED Final   Astrovirus NOT DETECTED NOT DETECTED Final   Norovirus GI/GII NOT DETECTED NOT DETECTED Final   Rotavirus A NOT DETECTED NOT DETECTED Final   Sapovirus (I, II, IV, and V) NOT DETECTED NOT DETECTED Final     Labs: Basic Metabolic Panel:  Recent Labs Lab 03/06/16 1556 03/07/16 0251 03/08/16 0736  NA 131* 133* 135  K 4.6 4.2 4.6  CL 97* 98* 102  CO2 26 27 27   GLUCOSE 119* 101* 106*  BUN 11 7 7   CREATININE 0.63 0.66 0.65  CALCIUM 8.8* 8.6* 8.4*  MG  --  2.1  --    Liver Function Tests:  Recent Labs Lab 03/06/16 1556 03/07/16 0251  AST 56* 45*  ALT 69* 59*  ALKPHOS 82 78  BILITOT 0.3 0.5  PROT 6.2* 5.4*  ALBUMIN 3.0* 2.3*   No results for input(s): LIPASE, AMYLASE in  the last 168 hours. No results for input(s): AMMONIA in the last 168 hours. CBC:  Recent Labs Lab 03/06/16 1556 03/07/16 0251 03/08/16 0736  WBC 8.7 9.0 8.8  NEUTROABS 7.5  --   --   HGB 10.6* 10.1* 9.9*  HCT 30.7* 29.5* 29.7*  MCV 95.9 94.6 96.7  PLT 282 260 271   Cardiac Enzymes: No results for input(s): CKTOTAL, CKMB, CKMBINDEX, TROPONINI in the last 168 hours. BNP: BNP (last 3 results) No results for input(s): BNP in the last 8760 hours.  ProBNP (last 3 results) No results for input(s): PROBNP in the last 8760 hours.  CBG: No results for input(s): GLUCAP in the last 168 hours.

## 2016-03-10 NOTE — Progress Notes (Signed)
Physical Therapy Treatment Patient Details Name: Lisa [name] MRN: [id number] DOB: 1923/05/02 Today's Date: 03/10/2016    History of Present Illness 83 y.o. female with past medical history of tremor, carotid artery stenosis, dyslipidemia who presented with one-week history of intermittent low-grade fever, cough, nasal congestion, sore throat.  She was found to have UTI and possible acute bronchitis.    PT Comments    Progressing steadily.  Emphasis on exercise and gait for strengthening and activity tolerance.  Follow Up Recommendations  SNF     Equipment Recommendations  None recommended by PT    Recommendations for Other Services       Precautions / Restrictions Precautions Precautions: Fall    Mobility  Bed Mobility Overal bed mobility: Needs Assistance Bed Mobility: Supine to Sit;Sit to Supine     Supine to sit: Min guard Sit to supine: Min guard      Transfers Overall transfer level: Needs assistance   Transfers: Sit to/from Stand Sit to Stand: Min guard         General transfer comment: guar for safety  Ambulation/Gait Ambulation/Gait assistance: Min assist Ambulation Distance (Feet): 48 Feet Assistive device: Rolling walker (2 wheeled) Gait Pattern/deviations: Step-through pattern   Gait velocity interpretation: Below normal speed for age/gender General Gait Details: steady, but quick to fatigue   Stairs            Wheelchair Mobility    Modified Rankin (Stroke Patients Only)       Balance   Sitting-balance support: No upper extremity supported Sitting balance-Leahy Scale: Fair         Standing balance comment: needs external support for dynamic tasks                    Cognition Arousal/Alertness: Awake/alert Behavior During Therapy: WFL for tasks assessed/performed Overall Cognitive Status: Within Functional Limits for tasks assessed                      Exercises General Exercises - Lower  Extremity Hip ABduction/ADduction: AROM;Both;10 reps;Standing Hip Flexion/Marching: AROM;Strengthening;10 reps;Standing    General Comments        Pertinent Vitals/Pain Pain Assessment: No/denies pain    Home Living                      Prior Function            PT Goals (current goals can now be found in the care plan section) Acute Rehab PT Goals Patient Stated Goal: get back to my independently living apartment PT Goal Formulation: With patient Time For Goal Achievement: 03/15/16 Potential to Achieve Goals: Good Progress towards PT goals: Progressing toward goals    Frequency    Min 3X/week      PT Plan Current plan remains appropriate    Co-evaluation             End of Session   Activity Tolerance: Patient tolerated treatment well Patient left: in bed;with call bell/phone within reach;with bed alarm set;with family/visitor present     Time: [id number] PT Time Calculation (min) (ACUTE ONLY): 24 min  Charges:  $Gait Training: 8-22 mins $Therapeutic Exercise: 8-22 mins                    G CodesEliseo Gum:      Mychaela Lennartz V Xoe Hoe 03/10/2016, 4:57 PM 03/10/2016  Reynolds BingKen Lyndell Gillyard, PT [phone]  (pager)

## 2016-03-10 NOTE — Clinical Social Work Note (Signed)
Patient discharging to [facility] Patient will then return to her home/apartment within [facility]. Received insurance authorization for [employer] Medicare: [phone]#[id number] - next review 1/5; RUG Level - RVB.  Discharge clinicals transmitted to facility. Daughter at the bedside and aware of discharge and requested ambulance transport.  Genelle BalVanessa Henny Strauch, MSW, LCSW Licensed Clinical Social Worker Clinical Social Work Department [facility] [phone]

## 2016-03-11 ENCOUNTER — Encounter: Payer: Self-pay | Admitting: Nurse Practitioner

## 2016-03-11 ENCOUNTER — Non-Acute Institutional Stay (SKILLED_NURSING_FACILITY): Payer: Medicare Other | Admitting: Nurse Practitioner

## 2016-03-11 DIAGNOSIS — D649 Anemia, unspecified: Secondary | ICD-10-CM

## 2016-03-11 DIAGNOSIS — B372 Candidiasis of skin and nail: Secondary | ICD-10-CM

## 2016-03-11 DIAGNOSIS — G250 Essential tremor: Secondary | ICD-10-CM | POA: Diagnosis not present

## 2016-03-11 NOTE — Assessment & Plan Note (Signed)
Under R+L breasts, apply Nystatin powder bid to affected x 2 weeks

## 2016-03-11 NOTE — Assessment & Plan Note (Signed)
Intentional tremor is evident, continue Propranolol and Primidone.

## 2016-03-11 NOTE — Progress Notes (Signed)
Location:  [facility] Room Number: 31 Place of Service:  SNF (31) Provider:  Charnee Turnipseed, Manxie  NP  Gaye AlkenBARNES,[name], MD  Patient Care Team: Juluis RainierElizabeth [name], MD as PCP - General (Family Medicine)  Extended Emergency Contact Information Primary Emergency Contact: [name],[name] Address: [address]          [place], [place] [address] [place] Home Phone: [phone] Mobile Phone: [phone] Relation: Daughter  Code Status:  DNR Goals of care: Advanced Directive information Advanced Directives 03/11/2016  Does Patient Have a Medical Advance Directive? Yes  Type of Advance Directive Healthcare Power of Attorney  Does patient want to make changes to medical advance directive? No - Patient declined  Copy of Healthcare Power of Attorney in Chart? No - copy requested  Would patient like information on creating a medical advance directive? -     Chief Complaint  Patient presents with  . Acute Visit    rash under breast    HPI:  Pt is a [age] y.o. female seen today for an acute visit for itching rash under R+L breasts  Hospitalized 03/06/16 to 03/11/15 for UTI, [facility] Acute bronchitis was fully treated with Rocephin.   Hx of essential tremor, taking Propranolol and Primidone. Adult failure to thrive, may result in SNF for care needs.   Past Medical History:  Diagnosis Date  . Arthritis   . Carotid artery occlusion   . Cataract   . Glaucoma   . High cholesterol   . Hypertension   . Osteoporosis   . Pneumonia July 03, 2014   Past Surgical History:  Procedure Laterality Date  . ABDOMINAL HYSTERECTOMY  1969  . APPENDECTOMY    . EYE SURGERY      Allergies  Allergen Reactions  . Mycostatin [Nystatin] Swelling and Rash    Allergies as of 03/11/2016      Reactions   Mycostatin [nystatin] Swelling, Rash      Medication List       Accurate as of 03/11/16  3:54 PM. Always use your most  recent med list.          acetaminophen 325 MG tablet Commonly known as:  TYLENOL Take 2 tablets (650 mg total) by mouth every 6 (six) hours as needed for mild pain (or Fever >/= 101).   ALPRAZolam 0.25 MG tablet Commonly known as:  XANAX Take 1 tablet (0.25 mg total) by mouth 2 (two) times daily as needed for anxiety.   aspirin EC 81 MG tablet Take 81 mg by mouth daily.   calcium carbonate 600 MG Tabs tablet Commonly known as:  OS-CAL Take 600 mg by mouth 2 (two) times daily with a meal.   ciprofloxacin 500 MG tablet Commonly known as:  CIPRO Take 1 tablet (500 mg total) by mouth 2 (two) times daily.   feeding supplement (ENSURE ENLIVE) Liqd Take 237 mLs by mouth 3 (three) times daily between meals.   guaiFENesin 600 MG 12 hr tablet Commonly known as:  MUCINEX Take 1 tablet (600 mg total) by mouth 2 (two) times daily as needed.   loperamide 2 MG capsule Commonly known as:  IMODIUM Take 1 capsule (2 mg total) by mouth 2 (two) times daily as needed for diarrhea or loose stools.   loratadine 10 MG tablet Commonly known as:  CLARITIN Take 10 mg by mouth daily.   multivitamin tablet Take 1 tablet by mouth daily.   primidone  50 MG tablet Commonly known as:  MYSOLINE Take 3 tablets (150 mg total) by mouth at bedtime.   propranolol 10 MG tablet Commonly known as:  INDERAL Take 2 tablets (20 mg total) by mouth 2 (two) times daily.   raloxifene 60 MG tablet Commonly known as:  EVISTA Take 60 mg by mouth at bedtime.   saccharomyces boulardii 250 MG capsule Commonly known as:  FLORASTOR Take 1 capsule (250 mg total) by mouth 2 (two) times daily.   timolol 0.5 % ophthalmic solution Commonly known as:  TIMOPTIC Place 1 drop into both eyes daily.   VYTORIN 10-20 MG tablet Generic drug:  ezetimibe-simvastatin Take 1 tablet by mouth daily.       Review of Systems  Constitutional: Negative for chills and fever.  Respiratory: Negative for chest tightness and  shortness of breath.   Cardiovascular: Negative for chest pain.  Gastrointestinal: Negative for abdominal distention, abdominal pain and blood in stool.  Genitourinary: Positive for frequency (chronic - but may be going a little more than usual.). Negative for difficulty urinating, dysuria, flank pain, hematuria, urgency and vaginal bleeding.  Skin: Positive for rash.       Itching rash under R+L breasts.   Neurological: Negative for weakness.     There is no immunization history on file for this patient. Pertinent  Health Maintenance Due  Topic Date Due  . DEXA SCAN  10/17/1990  . PNA vac Low Risk Adult (1 of 2 - PCV13) 10/17/1990  . INFLUENZA VACCINE  10/07/2015   No flowsheet data found. Functional Status Survey:    Vitals:   03/11/16 1302  BP: 116/64  Pulse: 70  Resp: 20  Temp: 97.3 F (36.3 C)  Weight: 137 lb 3.2 oz (62.2 kg)  Height: 5\' 1"  (1.549 m)   Body mass index is 25.92 kg/m. Physical Exam  Constitutional: She is oriented to person, place, and time. She appears well-developed and well-nourished.  HENT:  Head: Normocephalic.  Eyes: Conjunctivae and EOM are normal. Pupils are equal, round, and reactive to light.  Neck: Neck supple.  Cardiovascular: Normal rate, normal heart sounds and intact distal pulses.   No murmur heard. Pulmonary/Chest: Breath sounds normal.  Abdominal: Soft. Bowel sounds are normal. There is no tenderness.  Musculoskeletal: Normal range of motion. She exhibits no edema or tenderness.  Neurological: She is alert and oriented to person, place, and time.  Skin: Skin is warm and dry. Rash noted.  Satellite pattern rash under R+L breasts.   Psychiatric: She has a normal mood and affect. Her behavior is normal.    Labs reviewed:  Recent Labs  03/06/16 1556 03/07/16 0251 03/08/16 0736  NA 131* 133* 135  K 4.6 4.2 4.6  CL 97* 98* 102  CO2 26 27 27   GLUCOSE 119* 101* 106*  BUN 11 7 7   CREATININE 0.63 0.66 0.65  CALCIUM 8.8* 8.6*  8.4*  MG  --  2.1  --     Recent Labs  03/06/16 1556 03/07/16 0251  AST 56* 45*  ALT 69* 59*  ALKPHOS 82 78  BILITOT 0.3 0.5  PROT 6.2* 5.4*  ALBUMIN 3.0* 2.3*    Recent Labs  03/06/16 1556 03/07/16 0251 03/08/16 0736  WBC 8.7 9.0 8.8  NEUTROABS 7.5  --   --   HGB 10.6* 10.1* 9.9*  HCT 30.7* 29.5* 29.7*  MCV 95.9 94.6 96.7  PLT 282 260 271   Lab Results  Component Value Date   TSH 0.666 03/07/2016  No results found for: HGBA1C No results found for: CHOL, HDL, LDLCALC, LDLDIRECT, TRIG, CHOLHDL  Significant Diagnostic Results in last 30 days:  Dg Chest 2 View  Result Date: 03/06/2016 CLINICAL DATA:  Congestion and fever for almost 10 days and not getting better. EXAM: CHEST  2 VIEW COMPARISON:  07/03/2014. FINDINGS: Cardiac silhouette is mildly enlarged. No mediastinal or hilar masses. No convincing adenopathy. Calcified nodes are noted along the left hilum. There calcified granuloma in the left upper lobe. Scarring or chronic subsegmental atelectasis, or a combination, is noted at the lung bases. Lungs are hyperexpanded but otherwise clear. No convincing pleural effusion.  No pneumothorax. Skeletal structures are demineralized but grossly intact. IMPRESSION: No acute cardiopulmonary disease. Electronically Signed   By: Amie Portland M.D.   On: 03/06/2016 15:37    Assessment/Plan Skin yeast infection Under R+L breasts, apply Nystatin powder bid to affected x 2 weeks  Normocytic anemia Last Hgb 10, observe. Not on Fe B12 Folate.   Essential tremor Intentional tremor is evident, continue Propranolol and Primidone.      Family/ staff Communication: SNF for placement.   Labs/tests ordered:  none

## 2016-03-11 NOTE — Assessment & Plan Note (Signed)
Last Hgb 10, observe. Not on Fe B12 Folate.

## 2016-03-13 DIAGNOSIS — R509 Fever, unspecified: Secondary | ICD-10-CM | POA: Diagnosis not present

## 2016-03-15 ENCOUNTER — Non-Acute Institutional Stay (SKILLED_NURSING_FACILITY): Payer: Medicare Other | Admitting: Internal Medicine

## 2016-03-15 ENCOUNTER — Encounter: Payer: Self-pay | Admitting: Internal Medicine

## 2016-03-15 DIAGNOSIS — J9601 Acute respiratory failure with hypoxia: Secondary | ICD-10-CM | POA: Diagnosis not present

## 2016-03-15 DIAGNOSIS — F411 Generalized anxiety disorder: Secondary | ICD-10-CM | POA: Diagnosis not present

## 2016-03-15 DIAGNOSIS — N390 Urinary tract infection, site not specified: Secondary | ICD-10-CM | POA: Diagnosis not present

## 2016-03-15 DIAGNOSIS — R55 Syncope and collapse: Secondary | ICD-10-CM | POA: Diagnosis not present

## 2016-03-15 DIAGNOSIS — G5600 Carpal tunnel syndrome, unspecified upper limb: Secondary | ICD-10-CM | POA: Insufficient documentation

## 2016-03-15 DIAGNOSIS — J989 Respiratory disorder, unspecified: Secondary | ICD-10-CM | POA: Diagnosis not present

## 2016-03-15 DIAGNOSIS — D649 Anemia, unspecified: Secondary | ICD-10-CM | POA: Diagnosis not present

## 2016-03-15 DIAGNOSIS — G250 Essential tremor: Secondary | ICD-10-CM

## 2016-03-15 DIAGNOSIS — J209 Acute bronchitis, unspecified: Secondary | ICD-10-CM | POA: Diagnosis not present

## 2016-03-15 DIAGNOSIS — G5603 Carpal tunnel syndrome, bilateral upper limbs: Secondary | ICD-10-CM

## 2016-03-15 DIAGNOSIS — B372 Candidiasis of skin and nail: Secondary | ICD-10-CM

## 2016-03-15 DIAGNOSIS — R531 Weakness: Secondary | ICD-10-CM | POA: Diagnosis not present

## 2016-03-15 HISTORY — DX: Syncope and collapse: R55

## 2016-03-15 MED ORDER — ALPRAZOLAM 0.25 MG PO TABS: ORAL_TABLET | ORAL | 0 refills | Status: DC

## 2016-03-15 NOTE — Progress Notes (Signed)
History and Physical    Provider:  Murray HodgkinsArthur Ajahni Nay, MD Location:  [facility] Room Number: N31 Place of Service:  SNF (31)  PCP: Gaye AlkenBARNES,[name], MD Patient Care Team: Juluis RainierElizabeth [name], MD as PCP - General [facility](Family Medicine)  Extended Emergency Contact Information Primary Emergency Contact: [name],[name] Address: [address]          [place], [place] [address] [place] Home Phone: [phone] Mobile Phone: [phone] Relation: Daughter  Code Status: Full Code Goals of Care: Advanced Directive information Advanced Directives 03/15/2016  Does Patient Have a Medical Advance Directive? Yes  Type of Estate agentAdvance Directive Healthcare Power of [place];Living will  Does patient want to make changes to medical advance directive? -  Copy of Healthcare Power of Attorney in Chart? Yes  Would patient like information on creating a medical advance directive? -      Chief Complaint  Patient presents with  . New Admit To SNF    following hospitalization 03/06/16 to 03/10/16 for acute respiratory failure with hypoxia    HPI: Patient is a 83 y.o. female seen today [facility]. Hospitalized 03/06/16 to 03/10/16. Patient was seen by her primary doctor and an urgent care doctor in late Dec 2017. She was noted to have a respiratory infection and a UTI.  Despite antibiotics,she got progressively worse so she was taken to the ER and subsequently admitted to the hospital with acute bronchitis and hypoxia. She was started on antibiotics and improved a little, but remained quite weak. She wa admitted to the SNF fr strengthening prior to return to independent living. The fist couple of days following admission to the SNF showed increased strength and appetite. She began to feel much better. On the 3rd day, she developed an increased cough and began feeling worse.  This morning she had 3 loose stools and then blacked out briefly when  staff was attempting to walk her. She remained unresponsive for 30-60 seconds. She appeared to have normal respirations and pulse. BP was 150/76. O2 sat 93%. Prior to her syncope she stated she was very weak and that she felt like she was going to faint. She appeared neurologically intact when she woke up. No sign of seizure activity.  She has a complicated medical history. Other active problems include BET, HTN, anemia, HLD, glaucoma, OP, Vit D deficiency, carotid stenosis, and Urine incontinence. She has acquired an itchy red rash on the buttocks which is thought related to incontinence in the hospital. She has a rash under the left breast that she says is related to a prior attack of shingles.   I examined this patient several hours after her syncope. Although sleeping when I entered the room, she aroused easily and answered all questions. Sh is intact neurologically.  Past Medical History:  Diagnosis Date  . Arthritis   . Carotid artery occlusion   . Cataract   . Glaucoma   . High cholesterol   . Hypertension   . Osteoporosis   . Pneumonia July 03, 2014   Past Surgical History:  Procedure Laterality Date  . ABDOMINAL HYSTERECTOMY  1969  . APPENDECTOMY    . EYE SURGERY      reports that she has never smoked. She has never used smokeless tobacco. She reports that she does not drink alcohol or use drugs. Social History   Social History  . Marital status: Widowed    Spouse name: N/A  . Number of children: 3  .  Years of education: 30   Occupational History  . retired Child psychotherapist    Social History Main Topics  . Smoking status: Never Smoker  . Smokeless tobacco: Never Used  . Alcohol use No     Comment: rare  . Drug use: No  . Sexual activity: No   Other Topics Concern  . Not on file   Social History Narrative   Admitted to [facility] 03/10/16   Widowed   Never smoked   Alcohol none   Living Will, POA    Family History  Problem Relation Age  of Onset  . Hypertension Mother   . Heart disease Mother   . Hyperlipidemia Brother   . Hypertension Brother     Health Maintenance  Topic Date Due  . Janet Berlin  10/16/1942  . ZOSTAVAX  10/16/1985  . DEXA SCAN  10/17/1990  . PNA vac Low Risk Adult (1 of 2 - PCV13) 10/17/1990  . INFLUENZA VACCINE  10/07/2015    Allergies  Allergen Reactions  . Mycostatin [Nystatin] Swelling and Rash    Allergies as of 03/15/2016      Reactions   Mycostatin [nystatin] Swelling, Rash      Medication List       Accurate as of 03/15/16 11:01 AM. Always use your most recent med list.          acetaminophen 650 MG suppository Commonly known as:  TYLENOL Place 650 mg rectally. One suppository rectally every four hours as needed for fever greater than 100.3 for 24 hours.   acetaminophen 325 MG tablet Commonly known as:  TYLENOL Take 2 tablets (650 mg total) by mouth every 6 (six) hours as needed for mild pain (or Fever >/= 101).   ALPRAZolam 0.25 MG tablet Commonly known as:  XANAX Take 1 tablet (0.25 mg total) by mouth 2 (two) times daily as needed for anxiety.   aspirin EC 81 MG tablet Take 81 mg by mouth daily.   calcium carbonate 600 MG Tabs tablet Commonly known as:  OS-CAL Take 600 mg by mouth 2 (two) times daily with a meal.   doxycycline 100 MG EC tablet Commonly known as:  DORYX Take 100 mg by mouth. Take one tablet twice daily for 7 days.   feeding supplement (ENSURE ENLIVE) Liqd Take 237 mLs by mouth 3 (three) times daily between meals.   guaiFENesin 600 MG 12 hr tablet Commonly known as:  MUCINEX Take 1 tablet (600 mg total) by mouth 2 (two) times daily as needed.   loperamide 2 MG capsule Commonly known as:  IMODIUM Take 1 capsule (2 mg total) by mouth 2 (two) times daily as needed for diarrhea or loose stools.   loratadine 10 MG tablet Commonly known as:  CLARITIN Take 10 mg by mouth daily.   oseltamivir 75 MG capsule Commonly known as:  TAMIFLU Take 75 mg  by mouth. Take one capsule twice daily   primidone 50 MG tablet Commonly known as:  MYSOLINE Take 3 tablets (150 mg total) by mouth at bedtime.   propranolol 10 MG tablet Commonly known as:  INDERAL Take 10 mg by mouth. Take 2 tablets (20 mg) two times daily   raloxifene 60 MG tablet Commonly known as:  EVISTA Take 60 mg by mouth at bedtime.   saccharomyces boulardii 250 MG capsule Commonly known as:  FLORASTOR Take 1 capsule (250 mg total) by mouth 2 (two) times daily.   timolol 0.5 % ophthalmic solution Commonly known  as:  TIMOPTIC Place 1 drop into both eyes daily.   VYTORIN 10-20 MG tablet Generic drug:  ezetimibe-simvastatin Take 1 tablet by mouth daily.       Review of Systems  Constitutional: Negative for chills and fever.  HENT: Positive for hearing loss.   Eyes:       Glaucoma  Respiratory: Negative for chest tightness and shortness of breath.   Cardiovascular: Negative for chest pain.  Gastrointestinal: Positive for diarrhea. Negative for abdominal distention, abdominal pain and blood in stool.  Endocrine:       Hx hyperglycemia.  Genitourinary: Positive for frequency (chronic - but may be going a little more than usual.). Negative for difficulty urinating, dysuria, flank pain, hematuria, urgency and vaginal bleeding.       Urine incontinence  Musculoskeletal: Positive for arthralgias (left knee) and gait problem (using walker).  Skin: Positive for rash.       Itching post herpetic rash under R+L breasts. Multiple skin tags.  Neurological: Negative for weakness.  Hematological:       Chronic anemia  Psychiatric/Behavioral: The patient is nervous/anxious.     Vitals:   03/15/16 1040  BP: (!) 150/76  Pulse: 82  Resp: 20  Temp: 99 F (37.2 C)  SpO2: 93%  Weight: 132 lb (59.9 kg)  Height: 5\' 1"  (1.549 m)   Body mass index is 24.94 kg/m. Physical Exam  Constitutional: She is oriented to person, place, and time. She appears well-developed and  well-nourished. No distress.  HENT:  Head: Normocephalic.  Right Ear: External ear normal.  Left Ear: External ear normal.  Nose: Nose normal.  Mouth/Throat: Oropharynx is clear and moist. No oropharyngeal exudate.  Eyes: Conjunctivae and EOM are normal. Pupils are equal, round, and reactive to light. No scleral icterus.  Neck: Neck supple. No JVD present. No tracheal deviation present. No thyromegaly present.  Cardiovascular: Normal rate, regular rhythm, normal heart sounds and intact distal pulses.  Exam reveals no gallop and no friction rub.   No murmur heard. Pulmonary/Chest: Effort normal and breath sounds normal. No respiratory distress. She has no wheezes. She has no rales. She exhibits no tenderness.  Abdominal: Soft. Bowel sounds are normal. She exhibits no distension and no mass. There is no tenderness.  Musculoskeletal: Normal range of motion. She exhibits no edema or tenderness.  Generalized weakness  Lymphadenopathy:    She has no cervical adenopathy.  Neurological: She is alert and oriented to person, place, and time. No cranial nerve deficit. Coordination normal.  Skin: Skin is warm and dry. Rash (erythema of the buttocks.) noted. She is not diaphoretic. No erythema. No pallor.  chronic rash under R+L breasts compatible with previous shingles  Psychiatric: She has a normal mood and affect. Her behavior is normal. Judgment and thought content normal.    Labs reviewed: Basic Metabolic Panel:  Recent Labs  21/30/86 1556 03/07/16 0251 03/08/16 0736  NA 131* 133* 135  K 4.6 4.2 4.6  CL 97* 98* 102  CO2 26 27 27   GLUCOSE 119* 101* 106*  BUN 11 7 7   CREATININE 0.63 0.66 0.65  CALCIUM 8.8* 8.6* 8.4*  MG  --  2.1  --    Liver Function Tests:  Recent Labs  03/06/16 1556 03/07/16 0251  AST 56* 45*  ALT 69* 59*  ALKPHOS 82 78  BILITOT 0.3 0.5  PROT 6.2* 5.4*  ALBUMIN 3.0* 2.3*   No results for input(s): LIPASE, AMYLASE in the last 8760 hours. No results for  input(s): AMMONIA in the last 8760 hours. CBC:  Recent Labs  03/06/16 1556 03/07/16 0251 03/08/16 0736  WBC 8.7 9.0 8.8  NEUTROABS 7.5  --   --   HGB 10.6* 10.1* 9.9*  HCT 30.7* 29.5* 29.7*  MCV 95.9 94.6 96.7  PLT 282 260 271   Cardiac Enzymes: No results for input(s): CKTOTAL, CKMB, CKMBINDEX, TROPONINI in the last 8760 hours. BNP: Invalid input(s): POCBNP No results found for: HGBA1C Lab Results  Component Value Date   TSH 0.666 03/07/2016   Lab Results  Component Value Date   VITAMINB12 745 07/06/2014   No results found for: FOLATE Lab Results  Component Value Date   IRON 92 10/09/2015   TIBC 268 10/09/2015   FERRITIN 36 10/09/2015    Imaging and Procedures obtained prior to SNF admission: Dg Chest 2 View  Result Date: 03/06/2016 CLINICAL DATA:  Congestion and fever for almost 10 days and not getting better. EXAM: CHEST  2 VIEW COMPARISON:  07/03/2014. FINDINGS: Cardiac silhouette is mildly enlarged. No mediastinal or hilar masses. No convincing adenopathy. Calcified nodes are noted along the left hilum. There calcified granuloma in the left upper lobe. Scarring or chronic subsegmental atelectasis, or a combination, is noted at the lung bases. Lungs are hyperexpanded but otherwise clear. No convincing pleural effusion.  No pneumothorax. Skeletal structures are demineralized but grossly intact. IMPRESSION: No acute cardiopulmonary disease. Electronically Signed   By: Amie Portland M.D.   On: 03/06/2016 15:37    Assessment/Plan 1. Acute bronchitis, unspecified organism Recovering. Continues on doxycycline.  2. Acute respiratory failure with hypoxia (HCC) Currently compensated  3. Essential tremor Continue primidone and propranolol -Has plan to see neurologist soon.  4. Normocytic anemia Follow lab  5. Skin yeast infection No current evidence for yeast. Post herpetic damage noted.  6. Urinary tract infection without hematuria, site  unspecified Treated.  7. Syncope, unspecified syncope type -CBC, CMP, TSH  8. Anxiety -Increase alprazolam to .25 mg bid.  9. Carpal Tunnel -has told her daughtre she wants another appt with ortho to see what other options she has.  I anticipate she will be a short term rehab stay.

## 2016-03-16 LAB — CBC AND DIFFERENTIAL
HCT: 29 % — AB (ref 36–46)
Hemoglobin: 10 g/dL — AB (ref 12.0–16.0)
Platelets: 327 10*3/uL (ref 150–399)
WBC: 3.3 10^3/mL

## 2016-03-16 LAB — HEPATIC FUNCTION PANEL
ALT: 51 U/L — AB (ref 7–35)
AST: 35 U/L (ref 13–35)
Alkaline Phosphatase: 56 U/L (ref 25–125)
Bilirubin, Total: 0.2 mg/dL

## 2016-03-16 LAB — BASIC METABOLIC PANEL
BUN: 15 mg/dL (ref 4–21)
Creatinine: 0.7 mg/dL (ref <=1.1)
Glucose: 91 mg/dL
Potassium: 4.4 mmol/L (ref 3.4–5.3)
Sodium: 132 mmol/L — AB (ref 137–147)

## 2016-03-17 ENCOUNTER — Other Ambulatory Visit: Payer: Self-pay | Admitting: *Deleted

## 2016-03-17 ENCOUNTER — Encounter: Payer: Self-pay | Admitting: Nurse Practitioner

## 2016-03-17 DIAGNOSIS — E871 Hypo-osmolality and hyponatremia: Secondary | ICD-10-CM | POA: Insufficient documentation

## 2016-03-18 ENCOUNTER — Non-Acute Institutional Stay (SKILLED_NURSING_FACILITY): Payer: Medicare Other | Admitting: Internal Medicine

## 2016-03-18 ENCOUNTER — Encounter: Payer: Self-pay | Admitting: Internal Medicine

## 2016-03-18 DIAGNOSIS — R21 Rash and other nonspecific skin eruption: Secondary | ICD-10-CM | POA: Diagnosis not present

## 2016-03-18 DIAGNOSIS — J209 Acute bronchitis, unspecified: Secondary | ICD-10-CM

## 2016-03-18 DIAGNOSIS — N390 Urinary tract infection, site not specified: Secondary | ICD-10-CM

## 2016-03-18 DIAGNOSIS — E871 Hypo-osmolality and hyponatremia: Secondary | ICD-10-CM

## 2016-03-18 NOTE — Progress Notes (Signed)
Progress Note    Location:  [facility] Room Number: N31 Place of Service:  SNF [phone]) Provider:  Murray Hodgkins, MD  Patient Care Team: Kimber Relic, MD as PCP - General (Internal Medicine) Anson Fret, MD as Consulting Physician (Neurology) Man Johnney Ou, NP as Nurse Practitioner (Internal Medicine)  Extended Emergency Contact Information Primary Emergency Contact: Emmit Alexanders Address: [address]          [place], [place] [address] [place] Home Phone: [phone] Mobile Phone: [phone] Relation: Daughter  Code Status:  Full Code Goals of care: Advanced Directive information Advanced Directives 03/18/2016  Does Patient Have a Medical Advance Directive? Yes  Type of Estate agent of [place];Living will  Does patient want to make changes to medical advance directive? -  Copy of Healthcare Power of Attorney in Chart? Yes  Would patient like information on creating a medical advance directive? -     Chief Complaint  Patient presents with  . Acute Visit    new rash to back, does not itch.     HPI:  Pt is a 83 y.o. female seen today for an acute visit for evaluation of rash on there back. She denies burning or itching.  She is starting to feel a little better overall, but still has poor appetite.    Low protein and albumin on recent lab.  Anemia with hgb 10.0 and MCV 96.   Past Medical History:  Diagnosis Date  . Arthritis   . Carotid artery occlusion   . Cataract   . Glaucoma   . High cholesterol   . Hypertension   . Osteoporosis   . Pneumonia July 03, 2014  . Syncope 03/15/2016   Past Surgical History:  Procedure Laterality Date  . ABDOMINAL HYSTERECTOMY  1969  . APPENDECTOMY    . EYE SURGERY      Allergies  Allergen Reactions  . Mycostatin [Nystatin] Swelling and Rash    Allergies as of 03/18/2016      Reactions   Mycostatin [nystatin] Swelling, Rash      Medication  List       Accurate as of 03/18/16 11:43 AM. Always use your most recent med list.          acetaminophen 650 MG suppository Commonly known as:  TYLENOL Place 650 mg rectally. One suppository rectally every four hours as needed for fever greater than 100.3 for 24 hours.   acetaminophen 500 MG tablet Commonly known as:  TYLENOL Take 500 mg by mouth. Take one tablet at bedtime   acetaminophen 325 MG tablet Commonly known as:  TYLENOL Take 2 tablets (650 mg total) by mouth every 6 (six) hours as needed for mild pain (or Fever >/= 101).   ALPRAZolam 0.25 MG tablet Commonly known as:  XANAX One at lunch and again at supper to help anxiety   aspirin EC 81 MG tablet Take 81 mg by mouth daily.   calcium carbonate 600 MG Tabs tablet Commonly known as:  OS-CAL Take 600 mg by mouth 2 (two) times daily with a meal.   dextromethorphan-guaiFENesin 30-600 MG 12hr tablet Commonly known as:  MUCINEX DM Take 1 tablet by mouth. Take one tablet twice daily for 7 days   doxycycline 100 MG EC tablet Commonly known as:  DORYX Take 100 mg by mouth. Take one tablet twice daily for 7 days.   feeding supplement (ENSURE ENLIVE) Liqd Take 237 mLs by mouth 3 (three)  times daily between meals.   guaiFENesin 600 MG 12 hr tablet Commonly known as:  MUCINEX Take 1 tablet (600 mg total) by mouth 2 (two) times daily as needed.   loperamide 2 MG capsule Commonly known as:  IMODIUM Take 1 capsule (2 mg total) by mouth 2 (two) times daily as needed for diarrhea or loose stools.   loratadine 10 MG tablet Commonly known as:  CLARITIN Take 10 mg by mouth daily.   oseltamivir 75 MG capsule Commonly known as:  TAMIFLU Take 75 mg by mouth. Take one capsule twice daily   primidone 50 MG tablet Commonly known as:  MYSOLINE Take 3 tablets (150 mg total) by mouth at bedtime.   propranolol 10 MG tablet Commonly known as:  INDERAL Take 10 mg by mouth. Take 2 tablets (20 mg) two times daily   raloxifene  60 MG tablet Commonly known as:  EVISTA Take 60 mg by mouth at bedtime.   saccharomyces boulardii 250 MG capsule Commonly known as:  FLORASTOR Take 1 capsule (250 mg total) by mouth 2 (two) times daily.   timolol 0.5 % ophthalmic solution Commonly known as:  TIMOPTIC Place 1 drop into both eyes daily.   VYTORIN 10-20 MG tablet Generic drug:  ezetimibe-simvastatin Take 1 tablet by mouth daily.       Review of Systems  Constitutional: Negative for chills and fever.  HENT: Positive for hearing loss.   Eyes:       Glaucoma  Respiratory: Negative for chest tightness and shortness of breath.   Cardiovascular: Negative for chest pain.  Gastrointestinal: Positive for diarrhea. Negative for abdominal distention, abdominal pain and blood in stool.  Endocrine:       Hx hyperglycemia.  Genitourinary: Positive for frequency (chronic - but may be going a little more than usual.). Negative for difficulty urinating, dysuria, flank pain, hematuria, urgency and vaginal bleeding.       Urine incontinence  Musculoskeletal: Positive for arthralgias (left knee) and gait problem (using walker).  Skin: Positive for rash.       Salmon colored rash involving upper 2/3 of her back. Multiple 3 mm spots. Post herpetic rash under R+L breasts. Multiple skin tags.  Neurological: Negative for weakness.  Hematological:       Chronic anemia  Psychiatric/Behavioral: The patient is nervous/anxious.     Immunization History  Administered Date(s) Administered  . PPD Test 03/10/2016   Pertinent  Health Maintenance Due  Topic Date Due  . DEXA SCAN  10/17/1990  . PNA vac Low Risk Adult (1 of 2 - PCV13) 10/17/1990  . INFLUENZA VACCINE  10/07/2015   No flowsheet data found.  Vitals:   03/18/16 1121  BP: 128/70  Pulse: 68  Resp: 18  Temp: 98 F (36.7 C)  SpO2: 98%  Weight: 132 lb (59.9 kg)  Height: 5\' 1"  (1.549 m)   Body mass index is 24.94 kg/m. Physical Exam  Constitutional: She is oriented  to person, place, and time. She appears well-developed and well-nourished. No distress.  HENT:  Head: Normocephalic.  Right Ear: External ear normal.  Left Ear: External ear normal.  Nose: Nose normal.  Mouth/Throat: Oropharynx is clear and moist. No oropharyngeal exudate.  Eyes: Conjunctivae and EOM are normal. Pupils are equal, round, and reactive to light. No scleral icterus.  Neck: Neck supple. No JVD present. No tracheal deviation present. No thyromegaly present.  Cardiovascular: Normal rate, regular rhythm, normal heart sounds and intact distal pulses.  Exam reveals no gallop  and no friction rub.   No murmur heard. Pulmonary/Chest: Effort normal and breath sounds normal. No respiratory distress. She has no wheezes. She has no rales. She exhibits no tenderness.  Abdominal: Soft. Bowel sounds are normal. She exhibits no distension and no mass. There is no tenderness.  Musculoskeletal: Normal range of motion. She exhibits no edema or tenderness.  Generalized weakness  Lymphadenopathy:    She has no cervical adenopathy.  Neurological: She is alert and oriented to person, place, and time. No cranial nerve deficit. Coordination normal.  Skin: Skin is warm and dry. Rash (erythema of the buttocks.Carmie Kanner colored small patches abross the upper 2/3 of her back.) noted. She is not diaphoretic. No erythema. No pallor.  chronic rash under R+L breasts compatible with previous shingles  Psychiatric: She has a normal mood and affect. Her behavior is normal. Judgment and thought content normal.    Labs reviewed:  Recent Labs  03/06/16 1556 03/07/16 0251 03/08/16 0736 03/16/16  NA 131* 133* 135 132*  K 4.6 4.2 4.6 4.4  CL 97* 98* 102  --   CO2 26 27 27   --   GLUCOSE 119* 101* 106*  --   BUN 11 7 7 15   CREATININE 0.63 0.66 0.65 0.7  CALCIUM 8.8* 8.6* 8.4*  --   MG  --  2.1  --   --     Recent Labs  03/06/16 1556 03/07/16 0251 03/16/16  AST 56* 45* 35  ALT 69* 59* 51*  ALKPHOS 82 78  56  BILITOT 0.3 0.5  --   PROT 6.2* 5.4*  --   ALBUMIN 3.0* 2.3*  --     Recent Labs  03/06/16 1556 03/07/16 0251 03/08/16 0736 03/16/16  WBC 8.7 9.0 8.8 3.3  NEUTROABS 7.5  --   --   --   HGB 10.6* 10.1* 9.9* 10.0*  HCT 30.7* 29.5* 29.7* 29*  MCV 95.9 94.6 96.7  --   PLT 282 260 271 327   Lab Results  Component Value Date   TSH 0.666 03/07/2016   No results found for: HGBA1C No results found for: CHOL, HDL, LDLCALC, LDLDIRECT, TRIG, CHOLHDL  Significant Diagnostic Results in last 30 days:  Dg Chest 2 View  Result Date: 03/06/2016 CLINICAL DATA:  Congestion and fever for almost 10 days and not getting better. EXAM: CHEST  2 VIEW COMPARISON:  07/03/2014. FINDINGS: Cardiac silhouette is mildly enlarged. No mediastinal or hilar masses. No convincing adenopathy. Calcified nodes are noted along the left hilum. There calcified granuloma in the left upper lobe. Scarring or chronic subsegmental atelectasis, or a combination, is noted at the lung bases. Lungs are hyperexpanded but otherwise clear. No convincing pleural effusion.  No pneumothorax. Skeletal structures are demineralized but grossly intact. IMPRESSION: No acute cardiopulmonary disease. Electronically Signed   By: Amie Portland M.D.   On: 03/06/2016 15:37    Assessment/Plan Rash and nonspecific skin eruption I think this is most likely related to heat on heer back. It is not present anywhere except on her back. -Add 1% HC cream qd x 7 d.  Hyponatremia - stable at 132  Urinary tract infection without hematuria, site unspecified - culture was negative this week  Acute bronchitis, unspecified organism - improving

## 2016-03-22 ENCOUNTER — Non-Acute Institutional Stay (SKILLED_NURSING_FACILITY): Payer: Medicare Other | Admitting: Nurse Practitioner

## 2016-03-22 ENCOUNTER — Encounter: Payer: Self-pay | Admitting: Nurse Practitioner

## 2016-03-22 DIAGNOSIS — M25562 Pain in left knee: Secondary | ICD-10-CM | POA: Diagnosis not present

## 2016-03-22 DIAGNOSIS — N390 Urinary tract infection, site not specified: Secondary | ICD-10-CM | POA: Diagnosis not present

## 2016-03-22 DIAGNOSIS — F411 Generalized anxiety disorder: Secondary | ICD-10-CM | POA: Diagnosis not present

## 2016-03-22 DIAGNOSIS — G8929 Other chronic pain: Secondary | ICD-10-CM

## 2016-03-22 DIAGNOSIS — H9193 Unspecified hearing loss, bilateral: Secondary | ICD-10-CM

## 2016-03-22 DIAGNOSIS — E871 Hypo-osmolality and hyponatremia: Secondary | ICD-10-CM

## 2016-03-22 DIAGNOSIS — M25561 Pain in right knee: Secondary | ICD-10-CM | POA: Diagnosis not present

## 2016-03-22 DIAGNOSIS — R21 Rash and other nonspecific skin eruption: Secondary | ICD-10-CM

## 2016-03-22 DIAGNOSIS — G250 Essential tremor: Secondary | ICD-10-CM | POA: Diagnosis not present

## 2016-03-22 DIAGNOSIS — J9601 Acute respiratory failure with hypoxia: Secondary | ICD-10-CM | POA: Diagnosis not present

## 2016-03-22 DIAGNOSIS — H9190 Unspecified hearing loss, unspecified ear: Secondary | ICD-10-CM | POA: Insufficient documentation

## 2016-03-22 NOTE — Progress Notes (Signed)
Location:  [facility] Room Number: 31 Place of Service:  SNF (31) Provider:  Ernest Orr, Manxie  NP  Murray Hodgkins, MD  Patient Care Team: Kimber Relic, MD as PCP - General (Internal Medicine) Anson Fret, MD as Consulting Physician (Neurology) Natalin Bible Johnney Ou, NP as Nurse Practitioner (Internal Medicine)  Extended Emergency Contact Information Primary Emergency Contact: Emmit Alexanders Address: [address]          [place], [place] [address] [place] Home Phone: [phone] Mobile Phone: [phone] Relation: Daughter  Code Status:  Full Code Goals of care: Advanced Directive information Advanced Directives 03/18/2016  Does Patient Have a Medical Advance Directive? Yes  Type of Estate agent of [place];Living will  Does patient want to make changes to medical advance directive? -  Copy of Healthcare Power of Attorney in Chart? Yes  Would patient like information on creating a medical advance directive? -     Chief Complaint  Patient presents with  . Acute Visit    Large skintag/mole to (L) buttock    HPI:  Pt is a 83 y.o. female seen today for an acute visit for injured right buttock skin tag, excoriated urogenital and buttocks, dtr reported BLE coolness, was warm upon my examination, chronic knee pain, Tylenol hs is not adequate, usually 3-4am asks Tylenol, HOH, hearing aid, but it seems worse. Anxious at times, using Alprazolam since hospitalization   Hx of essential tremor, taking Propranolol and Primidone. Adult failure to thrive, may result in SNF for care needs.    Past Medical History:  Diagnosis Date  . Arthritis   . Carotid artery occlusion   . Cataract   . Glaucoma   . High cholesterol   . Hypertension   . Osteoporosis   . Pneumonia July 03, 2014  . Syncope 03/15/2016   Past Surgical History:  Procedure Laterality Date  . ABDOMINAL HYSTERECTOMY  1969  . APPENDECTOMY    . EYE SURGERY       Allergies  Allergen Reactions  . Mycostatin [Nystatin] Swelling and Rash    Allergies as of 03/22/2016      Reactions   Mycostatin [nystatin] Swelling, Rash      Medication List       Accurate as of 03/22/16 11:59 PM. Always use your most recent med list.          acetaminophen 650 MG suppository Commonly known as:  TYLENOL Place 650 mg rectally. One suppository rectally every four hours as needed for fever greater than 100.3 for 24 hours.   acetaminophen 500 MG tablet Commonly known as:  TYLENOL Take 500 mg by mouth. Take one tablet at bedtime   acetaminophen 325 MG tablet Commonly known as:  TYLENOL Take 2 tablets (650 mg total) by mouth every 6 (six) hours as needed for mild pain (or Fever >/= 101).   aspirin EC 81 MG tablet Take 81 mg by mouth daily.   calcium carbonate 600 MG Tabs tablet Commonly known as:  OS-CAL Take 600 mg by mouth 2 (two) times daily with a meal.   doxycycline 100 MG EC tablet Commonly known as:  DORYX Take 100 mg by mouth. Take one tablet twice daily for 7 days.   feeding supplement (ENSURE ENLIVE) Liqd Take 237 mLs by mouth 3 (three) times daily between meals.   guaiFENesin 600 MG 12 hr tablet Commonly known as:  MUCINEX Take 1 tablet (600 mg total) by mouth 2 (two) times daily  as needed.   loperamide 2 MG capsule Commonly known as:  IMODIUM Take 1 capsule (2 mg total) by mouth 2 (two) times daily as needed for diarrhea or loose stools.   loratadine 10 MG tablet Commonly known as:  CLARITIN Take 10 mg by mouth daily.   oseltamivir 75 MG capsule Commonly known as:  TAMIFLU Take 75 mg by mouth. Take one capsule twice daily   primidone 50 MG tablet Commonly known as:  MYSOLINE Take 3 tablets (150 mg total) by mouth at bedtime.   propranolol 10 MG tablet Commonly known as:  INDERAL Take 10 mg by mouth. Take 2 tablets (20 mg) two times daily   raloxifene 60 MG tablet Commonly known as:  EVISTA Take 60 mg by mouth at  bedtime.   saccharomyces boulardii 250 MG capsule Commonly known as:  FLORASTOR Take 1 capsule (250 mg total) by mouth 2 (two) times daily.   timolol 0.5 % ophthalmic solution Commonly known as:  TIMOPTIC Place 1 drop into both eyes daily.   VYTORIN 10-20 MG tablet Generic drug:  ezetimibe-simvastatin Take 1 tablet by mouth daily.       Review of Systems  Constitutional: Negative for chills and fever.  HENT: Positive for hearing loss.   Eyes:       Glaucoma  Respiratory: Negative for chest tightness and shortness of breath.   Cardiovascular: Negative for chest pain.  Gastrointestinal: Negative for abdominal distention, abdominal pain, blood in stool and diarrhea.  Endocrine:       Hx hyperglycemia.  Genitourinary: Positive for frequency (chronic - but may be going a little more than usual.). Negative for difficulty urinating, dysuria, flank pain, hematuria, urgency and vaginal bleeding.       Urine incontinence  Musculoskeletal: Positive for arthralgias (left knee) and gait problem (using walker).       Knee pain  Skin: Positive for rash.       Salmon colored rash involving upper 2/3 of her back. Multiple 3 mm spots. Post herpetic rash under R+L breasts. Multiple skin tags. Injured left buttock skin tag, excoriated urogenital and buttocks.   Neurological: Negative for weakness.  Hematological:       Chronic anemia  Psychiatric/Behavioral: The patient is nervous/anxious.     Immunization History  Administered Date(s) Administered  . PPD Test 03/10/2016   Pertinent  Health Maintenance Due  Topic Date Due  . DEXA SCAN  10/17/1990  . PNA vac Low Risk Adult (1 of 2 - PCV13) 10/17/1990  . INFLUENZA VACCINE  10/07/2015   No flowsheet data found. Functional Status Survey:    Vitals:   03/22/16 1322  BP: 120/70  Pulse: 70  Resp: 20  Temp: 98.5 F (36.9 C)   There is no height or weight on file to calculate BMI. Physical Exam  Constitutional: She is oriented to  person, place, and time. She appears well-developed and well-nourished. No distress.  HENT:  Head: Normocephalic.  Right Ear: External ear normal.  Left Ear: External ear normal.  Nose: Nose normal.  Mouth/Throat: Oropharynx is clear and moist. No oropharyngeal exudate.  Eyes: Conjunctivae and EOM are normal. Pupils are equal, round, and reactive to light. No scleral icterus.  Neck: Neck supple. No JVD present. No tracheal deviation present. No thyromegaly present.  Cardiovascular: Normal rate, regular rhythm, normal heart sounds and intact distal pulses.  Exam reveals no gallop and no friction rub.   No murmur heard. Pulmonary/Chest: Effort normal and breath sounds normal. No respiratory  distress. She has no wheezes. She has no rales. She exhibits no tenderness.  Abdominal: Soft. Bowel sounds are normal. She exhibits no distension and no mass. There is no tenderness.  Musculoskeletal: Normal range of motion. She exhibits no edema or tenderness.  Generalized weakness  Lymphadenopathy:    She has no cervical adenopathy.  Neurological: She is alert and oriented to person, place, and time. No cranial nerve deficit. Coordination normal.  Skin: Skin is warm and dry. Rash (erythema of the buttocks.Carmie Kanner colored small patches abross the upper 2/3 of her back.) noted. She is not diaphoretic. No erythema. No pallor.  chronic rash under R+L breasts compatible with previous shingles Injured left buttock skin tag, excoriated urogenital and buttocks  Psychiatric: She has a normal mood and affect. Her behavior is normal. Judgment and thought content normal.    Labs reviewed:  Recent Labs  03/06/16 1556 03/07/16 0251 03/08/16 0736 03/16/16  NA 131* 133* 135 132*  K 4.6 4.2 4.6 4.4  CL 97* 98* 102  --   CO2 26 27 27   --   GLUCOSE 119* 101* 106*  --   BUN 11 7 7 15   CREATININE 0.63 0.66 0.65 0.7  CALCIUM 8.8* 8.6* 8.4*  --   MG  --  2.1  --   --     Recent Labs  03/06/16 1556  03/07/16 0251 03/16/16  AST 56* 45* 35  ALT 69* 59* 51*  ALKPHOS 82 78 56  BILITOT 0.3 0.5  --   PROT 6.2* 5.4*  --   ALBUMIN 3.0* 2.3*  --     Recent Labs  03/06/16 1556 03/07/16 0251 03/08/16 0736 03/16/16  WBC 8.7 9.0 8.8 3.3  NEUTROABS 7.5  --   --   --   HGB 10.6* 10.1* 9.9* 10.0*  HCT 30.7* 29.5* 29.7* 29*  MCV 95.9 94.6 96.7  --   PLT 282 260 271 327   Lab Results  Component Value Date   TSH 0.666 03/07/2016   No results found for: HGBA1C No results found for: CHOL, HDL, LDLCALC, LDLDIRECT, TRIG, CHOLHDL  Significant Diagnostic Results in last 30 days:  Dg Chest 2 View  Result Date: 03/06/2016 CLINICAL DATA:  Congestion and fever for almost 10 days and not getting better. EXAM: CHEST  2 VIEW COMPARISON:  07/03/2014. FINDINGS: Cardiac silhouette is mildly enlarged. No mediastinal or hilar masses. No convincing adenopathy. Calcified nodes are noted along the left hilum. There calcified granuloma in the left upper lobe. Scarring or chronic subsegmental atelectasis, or a combination, is noted at the lung bases. Lungs are hyperexpanded but otherwise clear. No convincing pleural effusion.  No pneumothorax. Skeletal structures are demineralized but grossly intact. IMPRESSION: No acute cardiopulmonary disease. Electronically Signed   By: Amie Portland M.D.   On: 03/06/2016 15:37    Assessment/Plan Rash and nonspecific skin eruption Injured left buttock skin tag, excoriated urogenital and buttocks, apply Mycolog II bid to affected areas until healed. F/u dermatology for skin tag.   HOH (hard of hearing) Removal impacted ear wax R ear, continue healing aids, f/u Audiology  Urinary tract infection without hematuria Fully treated, f/u UA C/S  Hyponatremia Pending BMP  Anxiety state Continue Alprazolam.   Knee pain, bilateral Tylenol 500mg  qhs and 3am, Biofreeze q3am  Acute respiratory failure with hypoxia (HCC) Wean O2 off  Essential tremor Intentional tremor is  evident, continue Propranolol and Primidone.      Family/ staff Communication: IL when she is able.  Labs/tests ordered:  Pending BMP, UA C/S

## 2016-03-22 NOTE — Assessment & Plan Note (Signed)
Injured left buttock skin tag, excoriated urogenital and buttocks, apply Mycolog II bid to affected areas until healed. F/u dermatology for skin tag.

## 2016-03-22 NOTE — Assessment & Plan Note (Signed)
Intentional tremor is evident, continue Propranolol and Primidone.  

## 2016-03-22 NOTE — Assessment & Plan Note (Signed)
Removal impacted ear wax R ear, continue healing aids, f/u Audiology

## 2016-03-22 NOTE — Assessment & Plan Note (Signed)
Fully treated, f/u UA C/S

## 2016-03-22 NOTE — Assessment & Plan Note (Signed)
Pending BMP

## 2016-03-22 NOTE — Assessment & Plan Note (Signed)
Continue Alprazolam.

## 2016-03-22 NOTE — Assessment & Plan Note (Signed)
Tylenol 500mg  qhs and 3am, Biofreeze q3am

## 2016-03-22 NOTE — Assessment & Plan Note (Signed)
Wean O2 off

## 2016-03-24 ENCOUNTER — Ambulatory Visit: Payer: Medicare Other | Admitting: Neurology

## 2016-03-26 ENCOUNTER — Encounter: Payer: Self-pay | Admitting: Nurse Practitioner

## 2016-03-26 ENCOUNTER — Non-Acute Institutional Stay (SKILLED_NURSING_FACILITY): Payer: Medicare Other | Admitting: Nurse Practitioner

## 2016-03-26 DIAGNOSIS — F411 Generalized anxiety disorder: Secondary | ICD-10-CM

## 2016-03-26 DIAGNOSIS — H9193 Unspecified hearing loss, bilateral: Secondary | ICD-10-CM | POA: Diagnosis not present

## 2016-03-26 DIAGNOSIS — M25561 Pain in right knee: Secondary | ICD-10-CM

## 2016-03-26 DIAGNOSIS — J9601 Acute respiratory failure with hypoxia: Secondary | ICD-10-CM | POA: Diagnosis not present

## 2016-03-26 DIAGNOSIS — N390 Urinary tract infection, site not specified: Secondary | ICD-10-CM

## 2016-03-26 DIAGNOSIS — G8929 Other chronic pain: Secondary | ICD-10-CM

## 2016-03-26 DIAGNOSIS — E871 Hypo-osmolality and hyponatremia: Secondary | ICD-10-CM

## 2016-03-26 DIAGNOSIS — G250 Essential tremor: Secondary | ICD-10-CM

## 2016-03-26 DIAGNOSIS — M25562 Pain in left knee: Secondary | ICD-10-CM | POA: Diagnosis not present

## 2016-03-26 LAB — BASIC METABOLIC PANEL
BUN: 22 mg/dL — AB (ref 4–21)
Creatinine: 0.7 mg/dL (ref <=1.1)
Glucose: 111 mg/dL
Potassium: 4.5 mmol/L (ref 3.4–5.3)
Sodium: 135 mmol/L — AB (ref 137–147)

## 2016-03-26 NOTE — Progress Notes (Signed)
Location:  [facility] Room Number: 31 Place of Service:  SNF (31) Provider:  Calin Ellery, Manxie  NP  Murray Hodgkins, MD  Patient Care Team: Kimber Relic, MD as PCP - General (Internal Medicine) Anson Fret, MD as Consulting Physician (Neurology) Merial Moritz Johnney Ou, NP as Nurse Practitioner (Internal Medicine)  Extended Emergency Contact Information Primary Emergency Contact: Emmit Alexanders Address: [address]          [place], [place] [address] [place] Home Phone: [phone] Mobile Phone: [phone] Relation: Daughter  Code Status:  Full Code Goals of care: Advanced Directive information Advanced Directives 03/26/2016  Does Patient Have a Medical Advance Directive? Yes  Type of Estate agent of [place];Living will  Does patient want to make changes to medical advance directive? No - Patient declined  Copy of Healthcare Power of Attorney in Chart? Yes  Would patient like information on creating a medical advance directive? -     Chief Complaint  Patient presents with  . Acute Visit    Knee pain, hearing issues,    HPI:  Pt is a 83 y.o. female seen today for an acute visit for persisted HOH, ear wax was removed, no improvement of hearing, pending Audiology evaluation. Uncontrolled R+L (L>R) pain at night, Tylenol is not effective, desires to try topical agent.     Hx of essential tremor, head tubulation, intentional tremor in hands(L>R) , taking Propranolol and Primidone. Anxiety is managed with Alprazolam bid. Adult failure to thrive, may result in SNF for care needs.    Past Medical History:  Diagnosis Date  . Arthritis   . Carotid artery occlusion   . Cataract   . Glaucoma   . High cholesterol   . Hypertension   . Osteoporosis   . Pneumonia July 03, 2014  . Syncope 03/15/2016   Past Surgical History:  Procedure Laterality Date  . ABDOMINAL HYSTERECTOMY  1969  . APPENDECTOMY    . EYE SURGERY       Allergies  Allergen Reactions  . Mycostatin [Nystatin] Swelling and Rash    Allergies as of 03/26/2016      Reactions   Mycostatin [nystatin] Swelling, Rash      Medication List       Accurate as of 03/26/16 11:59 PM. Always use your most recent med list.          acetaminophen 650 MG suppository Commonly known as:  TYLENOL Place 650 mg rectally. One suppository rectally every four hours as needed for fever greater than 100.3 for 24 hours.   acetaminophen 325 MG tablet Commonly known as:  TYLENOL Take 2 tablets (650 mg total) by mouth every 6 (six) hours as needed for mild pain (or Fever >/= 101).   ALPRAZolam 0.25 MG dissolvable tablet Commonly known as:  NIRAVAM Take 0.25 mg by mouth 2 (two) times daily.   aspirin EC 81 MG tablet Take 81 mg by mouth daily.   calcium carbonate 600 MG Tabs tablet Commonly known as:  OS-CAL Take 600 mg by mouth 2 (two) times daily with a meal.   guaiFENesin 600 MG 12 hr tablet Commonly known as:  MUCINEX Take 1 tablet (600 mg total) by mouth 2 (two) times daily as needed.   loperamide 2 MG capsule Commonly known as:  IMODIUM Take 1 capsule (2 mg total) by mouth 2 (two) times daily as needed for diarrhea or loose stools.   loratadine 10 MG tablet Commonly known as:  CLARITIN Take 10 mg by mouth daily.   oseltamivir 75 MG capsule Commonly known as:  TAMIFLU Take 75 mg by mouth. Take one capsule twice daily   primidone 50 MG tablet Commonly known as:  MYSOLINE Take 3 tablets (150 mg total) by mouth at bedtime.   propranolol 10 MG tablet Commonly known as:  INDERAL Take 10 mg by mouth. Take 2 tablets (20 mg) two times daily   raloxifene 60 MG tablet Commonly known as:  EVISTA Take 60 mg by mouth at bedtime.   saccharomyces boulardii 250 MG capsule Commonly known as:  FLORASTOR Take 1 capsule (250 mg total) by mouth 2 (two) times daily.   timolol 0.5 % ophthalmic solution Commonly known as:  TIMOPTIC Place 1 drop  into both eyes daily.   VYTORIN 10-20 MG tablet Generic drug:  ezetimibe-simvastatin Take 1 tablet by mouth daily.       Review of Systems  Constitutional: Negative for chills and fever.  HENT: Positive for hearing loss.   Eyes:       Glaucoma  Respiratory: Negative for chest tightness and shortness of breath.   Cardiovascular: Negative for chest pain.  Gastrointestinal: Negative for abdominal distention, abdominal pain, blood in stool and diarrhea.  Endocrine:       Hx hyperglycemia.  Genitourinary: Positive for frequency (chronic - but may be going a little more than usual.). Negative for difficulty urinating, dysuria, flank pain, hematuria, urgency and vaginal bleeding.       Urine incontinence  Musculoskeletal: Positive for arthralgias (left knee) and gait problem (using walker).       R+L knee pain, worse at night.   Skin: Negative for rash.       Salmon colored rash involving upper 2/3 of her back. Multiple 3 mm spots. Post herpetic rash under R+L breasts. Multiple skin tags. Injured left buttock skin tag, excoriated urogenital and buttocks.   Neurological: Negative for weakness.  Hematological:       Chronic anemia  Psychiatric/Behavioral: The patient is nervous/anxious.     Immunization History  Administered Date(s) Administered  . PPD Test 03/10/2016   Pertinent  Health Maintenance Due  Topic Date Due  . DEXA SCAN  10/17/1990  . PNA vac Low Risk Adult (1 of 2 - PCV13) 10/17/1990  . INFLUENZA VACCINE  10/07/2015   No flowsheet data found. Functional Status Survey:    Vitals:   03/26/16 1732  BP: 126/70  Pulse: 68  Resp: 20  Temp: 97.8 F (36.6 C)  Weight: 133 lb 11.2 oz (60.6 kg)  Height: 5\' 1"  (1.549 m)   Body mass index is 25.26 kg/m. Physical Exam  Constitutional: She is oriented to person, place, and time. She appears well-developed and well-nourished. No distress.  HENT:  Head: Normocephalic.  Right Ear: External ear normal.  Left Ear:  External ear normal.  Nose: Nose normal.  Mouth/Throat: Oropharynx is clear and moist. No oropharyngeal exudate.  Eyes: Conjunctivae and EOM are normal. Pupils are equal, round, and reactive to light. No scleral icterus.  Neck: Neck supple. No JVD present. No tracheal deviation present. No thyromegaly present.  Cardiovascular: Normal rate, regular rhythm, normal heart sounds and intact distal pulses.  Exam reveals no gallop and no friction rub.   No murmur heard. Pulmonary/Chest: Effort normal and breath sounds normal. No respiratory distress. She has no wheezes. She has no rales. She exhibits no tenderness.  Abdominal: Soft. Bowel sounds are normal. She exhibits no distension and no mass. There  is no tenderness.  Musculoskeletal: Normal range of motion. She exhibits no edema or tenderness.  Generalized weakness  Lymphadenopathy:    She has no cervical adenopathy.  Neurological: She is alert and oriented to person, place, and time. No cranial nerve deficit. Coordination normal.  Skin: Skin is warm and dry. Rash (erythema of the buttocks.Carmie Kanner. Salmon colored small patches abross the upper 2/3 of her back.) noted. She is not diaphoretic. No erythema. No pallor.  chronic rash under R+L breasts compatible with previous shingles Injured left buttock skin tag, excoriated urogenital and buttocks  Psychiatric: She has a normal mood and affect. Her behavior is normal. Judgment and thought content normal.    Labs reviewed:  Recent Labs  03/06/16 1556 03/07/16 0251 03/08/16 0736 03/16/16  NA 131* 133* 135 132*  K 4.6 4.2 4.6 4.4  CL 97* 98* 102  --   CO2 26 27 27   --   GLUCOSE 119* 101* 106*  --   BUN 11 7 7 15   CREATININE 0.63 0.66 0.65 0.7  CALCIUM 8.8* 8.6* 8.4*  --   MG  --  2.1  --   --     Recent Labs  03/06/16 1556 03/07/16 0251 03/16/16  AST 56* 45* 35  ALT 69* 59* 51*  ALKPHOS 82 78 56  BILITOT 0.3 0.5  --   PROT 6.2* 5.4*  --   ALBUMIN 3.0* 2.3*  --     Recent Labs   03/06/16 1556 03/07/16 0251 03/08/16 0736 03/16/16  WBC 8.7 9.0 8.8 3.3  NEUTROABS 7.5  --   --   --   HGB 10.6* 10.1* 9.9* 10.0*  HCT 30.7* 29.5* 29.7* 29*  MCV 95.9 94.6 96.7  --   PLT 282 260 271 327   Lab Results  Component Value Date   TSH 0.666 03/07/2016   No results found for: HGBA1C No results found for: CHOL, HDL, LDLCALC, LDLDIRECT, TRIG, CHOLHDL  Significant Diagnostic Results in last 30 days:  Dg Chest 2 View  Result Date: 03/06/2016 CLINICAL DATA:  Congestion and fever for almost 10 days and not getting better. EXAM: CHEST  2 VIEW COMPARISON:  07/03/2014. FINDINGS: Cardiac silhouette is mildly enlarged. No mediastinal or hilar masses. No convincing adenopathy. Calcified nodes are noted along the left hilum. There calcified granuloma in the left upper lobe. Scarring or chronic subsegmental atelectasis, or a combination, is noted at the lung bases. Lungs are hyperexpanded but otherwise clear. No convincing pleural effusion.  No pneumothorax. Skeletal structures are demineralized but grossly intact. IMPRESSION: No acute cardiopulmonary disease. Electronically Signed   By: Amie Portlandavid  Ormond M.D.   On: 03/06/2016 15:37    Assessment/Plan Acute respiratory failure with hypoxia (HCC) Resolved, off O2  Essential tremor Intentional tremor is evident, continue Propranolol and Primidone.   HOH (hard of hearing) Audiology evaluation  Urinary tract infection without hematuria 03/23/16 UA showed no growth  Hyponatremia Pending BMP, last Na 132 03/16/16  Anxiety state Continue Alprazolam bid.   Knee pain, bilateral Continue Tylenol, adding Lidoderm patch 12hours on at night. Observe.      Family/ staff Communication: IL when she is able.   Labs/tests ordered:  Pending BMP

## 2016-03-27 NOTE — Assessment & Plan Note (Signed)
Intentional tremor is evident, continue Propranolol and Primidone.  

## 2016-03-27 NOTE — Assessment & Plan Note (Signed)
Pending BMP, last Na 132 03/16/16

## 2016-03-27 NOTE — Assessment & Plan Note (Signed)
Audiology evaluation.  

## 2016-03-27 NOTE — Assessment & Plan Note (Signed)
03/23/16 UA showed no growth

## 2016-03-27 NOTE — Assessment & Plan Note (Signed)
Continue Tylenol, adding Lidoderm patch 12hours on at night. Observe.

## 2016-03-27 NOTE — Assessment & Plan Note (Signed)
Continue Alprazolam bid.

## 2016-03-27 NOTE — Assessment & Plan Note (Signed)
Resolved, off O2

## 2016-03-29 ENCOUNTER — Other Ambulatory Visit: Payer: Self-pay | Admitting: *Deleted

## 2016-04-01 LAB — HEPATIC FUNCTION PANEL
ALT: 13 U/L (ref 7–35)
AST: 17 U/L (ref 13–35)
Alkaline Phosphatase: 51 U/L (ref 25–125)
Bilirubin, Total: 0.3 mg/dL

## 2016-04-01 LAB — BASIC METABOLIC PANEL
BUN: 19 mg/dL (ref 4–21)
Creatinine: 0.6 mg/dL (ref <=1.1)
Glucose: 102 mg/dL
Potassium: 4.3 mmol/L (ref 3.4–5.3)
Sodium: 135 mmol/L — AB (ref 137–147)

## 2016-04-01 LAB — CBC AND DIFFERENTIAL
HCT: 29 % — AB (ref 36–46)
Hemoglobin: 9.5 g/dL — AB (ref 12.0–16.0)
Platelets: 171 10*3/uL (ref 150–399)
WBC: 5.6 10^3/mL

## 2016-04-02 ENCOUNTER — Non-Acute Institutional Stay (SKILLED_NURSING_FACILITY): Payer: Medicare Other | Admitting: Internal Medicine

## 2016-04-02 DIAGNOSIS — J9601 Acute respiratory failure with hypoxia: Secondary | ICD-10-CM | POA: Diagnosis not present

## 2016-04-02 DIAGNOSIS — G2581 Restless legs syndrome: Secondary | ICD-10-CM

## 2016-04-02 DIAGNOSIS — R21 Rash and other nonspecific skin eruption: Secondary | ICD-10-CM

## 2016-04-02 DIAGNOSIS — D649 Anemia, unspecified: Secondary | ICD-10-CM | POA: Diagnosis not present

## 2016-04-05 ENCOUNTER — Encounter: Payer: Self-pay | Admitting: Internal Medicine

## 2016-04-05 ENCOUNTER — Other Ambulatory Visit: Payer: Self-pay | Admitting: *Deleted

## 2016-04-05 ENCOUNTER — Other Ambulatory Visit: Payer: Self-pay | Admitting: Neurology

## 2016-04-05 DIAGNOSIS — G2581 Restless legs syndrome: Secondary | ICD-10-CM | POA: Insufficient documentation

## 2016-04-05 NOTE — Progress Notes (Signed)
Discharge note  Location:  [facility] Room Number: N31 Place of Service:  SNF [phone](31)  Provider: PCP: [name], MD Patient Care Team: [name], MD as PCP - General (Internal Medicine) Anson FretAntonia B Ahern, MD as Consulting Physician (Neurology) [name], NP as Nurse Practitioner (Internal Medicine)  Extended Emergency Contact Information Primary Emergency Contact: [name],[name] Address: [address]          [place], [place] [address] [place] Home Phone: [phone] Mobile Phone: [phone] Relation: Daughter  Code Status: Full Code Goals of care:  Advanced Directive information Advanced Directives 04/02/2016  Does Patient Have a Medical Advance Directive? Yes  Type of Advance Directive Healthcare Power of Attorney  Does patient want to make changes to medical advance directive? -  Copy of Healthcare Power of Attorney in Chart? Yes  Would patient like information on creating a medical advance directive? -     Allergies  Allergen Reactions  . Mycostatin [Nystatin] Swelling and Rash    Chief Complaint  Patient presents with  . Discharge Note    HPI:  83 y.o. female  Is to return home 04/03/16. [facility] hospitalization for acute respiratory failure with hypoxia secondary to acute bronchitis. She has madee steady improvement in the SNF and is now doing much for herself. Oxygen has been weaned off.  She had a rash on arrival, but this has cleared up. Anemia has improved.   She has a new complaint today of severe restless legs. This is not a new problem. She states it ha been present for months. It interferes with her sleep    Past Medical History:  Diagnosis Date  . Arthritis   . Carotid artery occlusion   . Cataract   . Glaucoma   . High cholesterol   . Hypertension   . Osteoporosis   . Pneumonia July 03, 2014  . Syncope 03/15/2016    Past Surgical History:  Procedure  Laterality Date  . ABDOMINAL HYSTERECTOMY  1969  . APPENDECTOMY    . EYE SURGERY        reports that she has never smoked. She has never used smokeless tobacco. She reports that she does not drink alcohol or use drugs. Social History   Social History  . Marital status: Widowed    Spouse name: N/A  . Number of children: 3  . Years of education: 6216   Occupational History  . 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 Smoking status: Never Smoker  . Smokeless tobacco: Never Used  . Alcohol use No     Comment: rare  . Drug use: No  . Sexual activity: No   Other Topics Concern  . Not on file   Social History Narrative   [facility]   Widowed   Never smoked   Alcohol none   Living Will, POA     Allergies  Allergen Reactions  . Mycostatin [Nystatin] Swelling and Rash    Pertinent  Health Maintenance Due  Topic Date Due  . DEXA SCAN  10/17/1990  . PNA vac Low Risk Adult (1 of 2 - PCV13) 10/17/1990  . INFLUENZA VACCINE  10/07/2015    Medications: Allergies as of 04/02/2016      Reactions   Mycostatin [nystatin] Swelling, Rash      Medication List       Accurate as of 04/02/16  11:59 PM. Always use your most recent med list.          acetaminophen 650 MG suppository Commonly known as:  TYLENOL Place 650 mg rectally. One suppository rectally every four hours as needed for fever greater than 100.3 for 24 hours.   acetaminophen 500 MG tablet Commonly known as:  TYLENOL Take 500 mg by mouth. Take one tablet at bedtime and 3 am   acetaminophen 325 MG tablet Commonly known as:  TYLENOL Take 2 tablets (650 mg total) by mouth every 6 (six) hours as needed for mild pain (or Fever >/= 101).   ALPRAZolam 0.25 MG dissolvable tablet Commonly known as:  NIRAVAM Take 0.25 mg by mouth 2 (two) times daily.   aspirin EC 81 MG tablet Take 81 mg by mouth daily.   BIOFREEZE ROLL-ON 4 % Gel Generic drug:  Menthol  (Topical Analgesic) Apply topically. Apply to right knee as needed   calcium carbonate 600 MG Tabs tablet Commonly known as:  OS-CAL Take 600 mg by mouth 2 (two) times daily with a meal.   feeding supplement Liqd Take 1 Container by mouth. Drink three times a day before meals   guaiFENesin 600 MG 12 hr tablet Commonly known as:  MUCINEX Take 1 tablet (600 mg total) by mouth 2 (two) times daily as needed.   lidocaine 4 % cream Commonly known as:  LMX Apply 1 application topically. Apply to both knees twice a day   loperamide 2 MG capsule Commonly known as:  IMODIUM Take 1 capsule (2 mg total) by mouth 2 (two) times daily as needed for diarrhea or loose stools.   loratadine 10 MG tablet Commonly known as:  CLARITIN Take 10 mg by mouth daily.   oseltamivir 75 MG capsule Commonly known as:  TAMIFLU Take 75 mg by mouth. Take one capsule twice daily   pramipexole 0.125 MG tablet Commonly known as:  MIRAPEX Take 0.125 mg by mouth. Take one tablet at bedtime for restless leg syndrome   primidone 50 MG tablet Commonly known as:  MYSOLINE Take 3 tablets (150 mg total) by mouth at bedtime.   propranolol 10 MG tablet Commonly known as:  INDERAL Take 10 mg by mouth. Take 2 tablets (20 mg) two times daily   raloxifene 60 MG tablet Commonly known as:  EVISTA Take 60 mg by mouth at bedtime.   saccharomyces boulardii 250 MG capsule Commonly known as:  FLORASTOR Take 1 capsule (250 mg total) by mouth 2 (two) times daily.   timolol 0.5 % ophthalmic solution Commonly known as:  TIMOPTIC Place 1 drop into both eyes daily.   VYTORIN 10-20 MG tablet Generic drug:  ezetimibe-simvastatin Take 1 tablet by mouth daily.       Review of Systems  Constitutional: Negative for chills and fever.  HENT: Positive for hearing loss.   Eyes:       Glaucoma  Respiratory: Negative for chest tightness and shortness of breath.   Cardiovascular: Negative for chest pain.  Gastrointestinal:  Positive for diarrhea. Negative for abdominal distention, abdominal pain and blood in stool.  Endocrine:       Hx hyperglycemia.  Genitourinary: Positive for frequency (chronic - but may be going a little more than usual.). Negative for difficulty urinating, dysuria, flank pain, hematuria, urgency and vaginal bleeding.       Urine incontinence  Musculoskeletal: Positive for arthralgias (left knee) and gait problem (using walker).       Severe restless legs complaints today  Skin: Positive for rash.       Post herpetic rash under R+L breasts. Multiple skin tags.  Neurological: Negative for weakness.  Hematological:       Chronic anemia  Psychiatric/Behavioral: The patient is nervous/anxious.     Vitals:   04/02/16 1542  BP: 120/68  Pulse: 68  Resp: 18  Temp: 97.8 F (36.6 C)  SpO2: 97%  Weight: 131 lb 8 oz (59.6 kg)  Height: 5\' 1"  (1.549 m)   Body mass index is 24.85 kg/m. Physical Exam  Constitutional: She is oriented to person, place, and time. She appears well-developed and well-nourished. No distress.  HENT:  Head: Normocephalic.  Right Ear: External ear normal.  Left Ear: External ear normal.  Nose: Nose normal.  Mouth/Throat: Oropharynx is clear and moist. No oropharyngeal exudate.  Eyes: Conjunctivae and EOM are normal. Pupils are equal, round, and reactive to light. No scleral icterus.  Neck: Neck supple. No JVD present. No tracheal deviation present. No thyromegaly present.  Cardiovascular: Normal rate, regular rhythm, normal heart sounds and intact distal pulses.  Exam reveals no gallop and no friction rub.   No murmur heard. Pulmonary/Chest: Effort normal and breath sounds normal. No respiratory distress. She has no wheezes. She has no rales. She exhibits no tenderness.  Abdominal: Soft. Bowel sounds are normal. She exhibits no distension and no mass. There is no tenderness.  Musculoskeletal: Normal range of motion. She exhibits no edema or tenderness.    Generalized weakness  Lymphadenopathy:    She has no cervical adenopathy.  Neurological: She is alert and oriented to person, place, and time. No cranial nerve deficit. Coordination normal.  Skin: Skin is warm and dry. Rash: . She is not diaphoretic. No erythema. No pallor.  chronic rash under R+L breasts compatible with previous shingles  Psychiatric: She has a normal mood and affect. Her behavior is normal. Judgment and thought content normal.    Labs reviewed: Basic Metabolic Panel:  Recent Labs  16/10/96 1556 03/07/16 0251 03/08/16 0736 03/16/16 03/26/16 04/01/16  NA 131* 133* 135 132* 135* 135*  K 4.6 4.2 4.6 4.4 4.5 4.3  CL 97* 98* 102  --   --   --   CO2 26 27 27   --   --   --   GLUCOSE 119* 101* 106*  --   --   --   BUN 11 7 7 15  22* 19  CREATININE 0.63 0.66 0.65 0.7 0.7 0.6  CALCIUM 8.8* 8.6* 8.4*  --   --   --   MG  --  2.1  --   --   --   --    Liver Function Tests:  Recent Labs  03/06/16 1556 03/07/16 0251 03/16/16 04/01/16  AST 56* 45* 35 17  ALT 69* 59* 51* 13  ALKPHOS 82 78 56 51  BILITOT 0.3 0.5  --   --   PROT 6.2* 5.4*  --   --   ALBUMIN 3.0* 2.3*  --   --    No results for input(s): LIPASE, AMYLASE in the last 8760 hours. No results for input(s): AMMONIA in the last 8760 hours. CBC:  Recent Labs  03/06/16 1556 03/07/16 0251 03/08/16 0736 03/16/16 04/01/16  WBC 8.7 9.0 8.8 3.3 5.6  NEUTROABS 7.5  --   --   --   --   HGB 10.6* 10.1* 9.9* 10.0* 9.5*  HCT 30.7* 29.5* 29.7* 29* 29*  MCV 95.9 94.6 96.7  --   --  PLT 282 260 271 327 171    Assessment/Plan:   1. Acute respiratory failure with hypoxia (HCC) resolved  2. Normocytic anemia continues  3. Rash and nonspecific skin eruption resolved  4. Restless legs -added Mirapex 0.125 mg qhs     Patient is being discharged with the following home health services:  none  Patient is being discharged with the following durable medical equipment:  none  Patient has been advised to  f/u with their PCP in 1-2 weeks to for a transitions of care visit.  Social services at their facility was responsible for arranging this appointment.  Pt was provided with adequate prescriptions of noncontrolled medications to reach the scheduled appointment .  For controlled substances, a limited supply was provided as appropriate for the individual patient.  If the pt normally receives these medications from a pain clinic or has a contract with another physician, these medications should be received from that clinic or physician only).    Future labs/tests needed:  CBC, CMP

## 2016-04-06 ENCOUNTER — Telehealth: Payer: Self-pay | Admitting: *Deleted

## 2016-04-06 NOTE — Telephone Encounter (Signed)
Called and spoke with daughter, [name]. She stated her mother just got out of the hospital and she is still weak. She will call back to make f/u once patient is feeling better. Advised she has to be seen at least once a year for med refills. She was last seen 10/09/15. Daughter verbalized understanding. Advised I will send in refills for them and will give Dr Ahern the message. 

## 2016-04-06 NOTE — Telephone Encounter (Signed)
Called and spoke with daughter, Britta MccreedyBarbara. She stated her mother just got out of the hospital and she is still weak. She will call back to make f/u once patient is feeling better. Advised she has to be seen at least once a year for med refills. She was last seen 10/09/15. Daughter verbalized understanding. Advised I will send in refills for them and will give Dr Lucia GaskinsAhern the message.

## 2016-04-06 NOTE — Telephone Encounter (Signed)
That's fine. thanks

## 2016-04-12 DIAGNOSIS — L570 Actinic keratosis: Secondary | ICD-10-CM | POA: Diagnosis not present

## 2016-04-12 DIAGNOSIS — D485 Neoplasm of uncertain behavior of skin: Secondary | ICD-10-CM | POA: Diagnosis not present

## 2016-04-12 DIAGNOSIS — L918 Other hypertrophic disorders of the skin: Secondary | ICD-10-CM | POA: Diagnosis not present

## 2016-04-15 DIAGNOSIS — D649 Anemia, unspecified: Secondary | ICD-10-CM | POA: Diagnosis not present

## 2016-04-15 DIAGNOSIS — Z7189 Other specified counseling: Secondary | ICD-10-CM | POA: Diagnosis not present

## 2016-04-15 DIAGNOSIS — K644 Residual hemorrhoidal skin tags: Secondary | ICD-10-CM | POA: Diagnosis not present

## 2016-04-15 DIAGNOSIS — E8809 Other disorders of plasma-protein metabolism, not elsewhere classified: Secondary | ICD-10-CM | POA: Diagnosis not present

## 2016-04-15 DIAGNOSIS — Z87440 Personal history of urinary (tract) infections: Secondary | ICD-10-CM | POA: Diagnosis not present

## 2016-05-14 DIAGNOSIS — G5601 Carpal tunnel syndrome, right upper limb: Secondary | ICD-10-CM | POA: Diagnosis not present

## 2016-05-14 DIAGNOSIS — G5602 Carpal tunnel syndrome, left upper limb: Secondary | ICD-10-CM | POA: Diagnosis not present

## 2016-05-17 IMAGING — CT CT ANGIO CHEST
2 of 9 series · 18 of 46 positions shown · IV contrast (Omni 300)
Comparison: Chest radiograph performed 07/03/2014

CLINICAL DATA: Acute onset of nausea, vomiting, dizziness and
lightheadedness. Initial encounter.

EXAM:
CT ANGIOGRAPHY CHEST WITH CONTRAST
TECHNIQUE: Multidetector CT imaging of the chest was performed using the
standard protocol during bolus administration of intravenous
contrast. Multiplanar CT image reconstructions and MIPs were
obtained to evaluate the vascular anatomy.
CONTRAST:  80mL OMNIPAQUE IOHEXOL 350 MG/ML SOLN

[Series 5: thins · axial · 0.51mm/px · z∈[-237,+22]mm · 15 of 293 slices shown]
[im 17/293  lung]
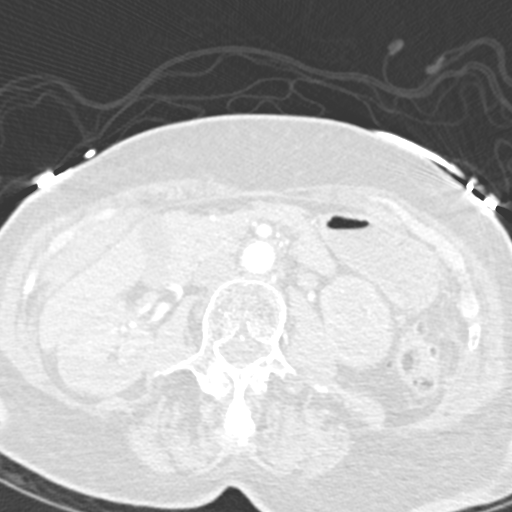
[im 33/293  soft-tissue]
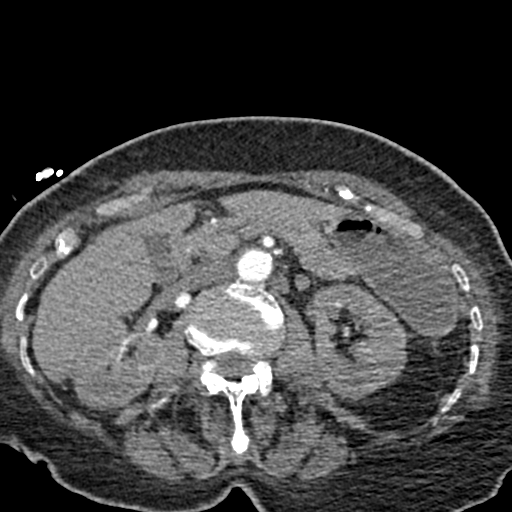
[im 49/293  lung]
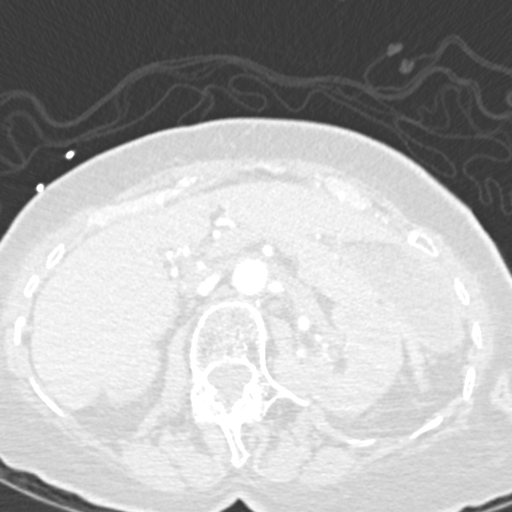
[im 65/293  soft-tissue]
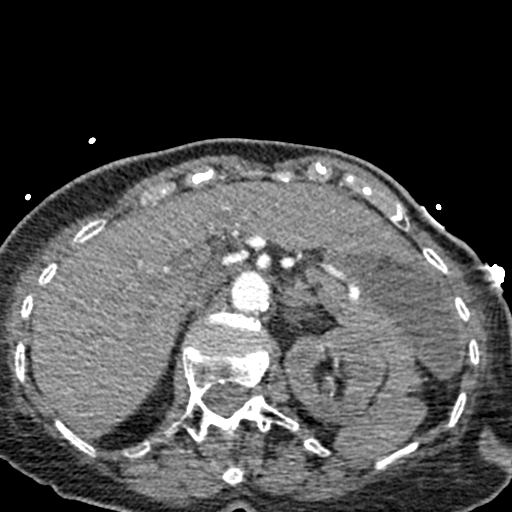
[im 98/293  lung]
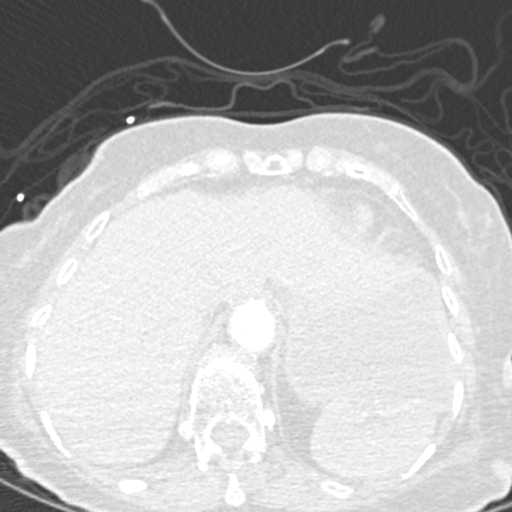
[im 114/293  soft-tissue]
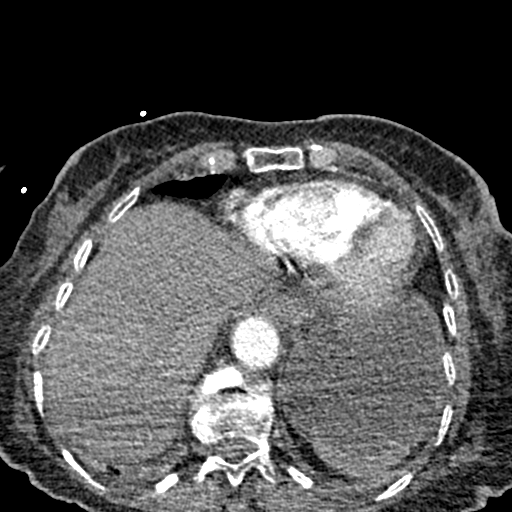
[im 130/293  lung]
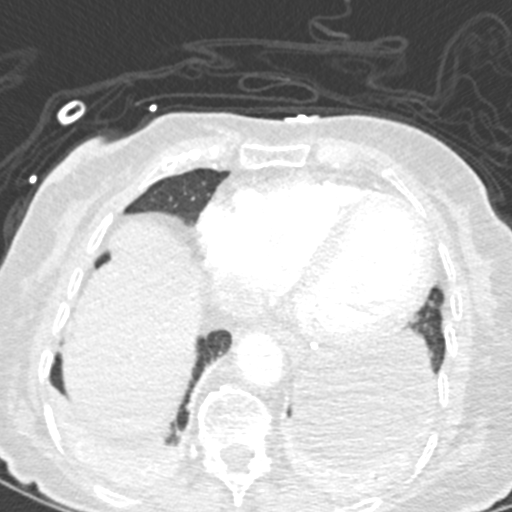
[im 147/293  soft-tissue]
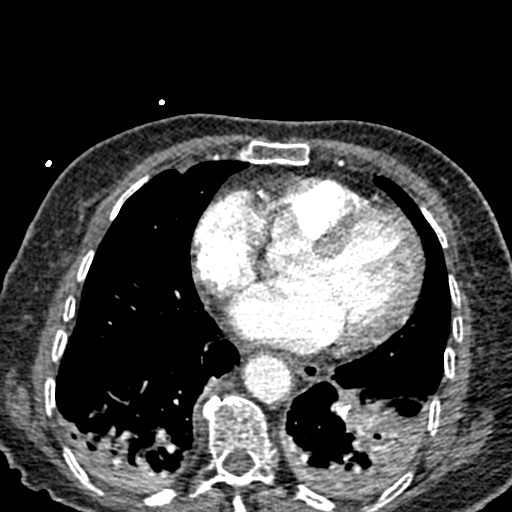
[im 163/293  lung]
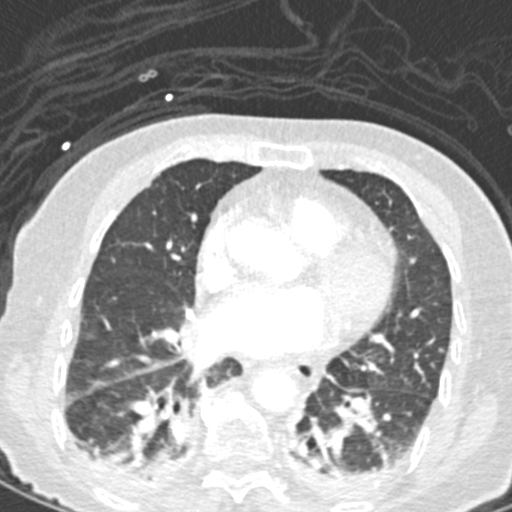
[im 179/293  soft-tissue]
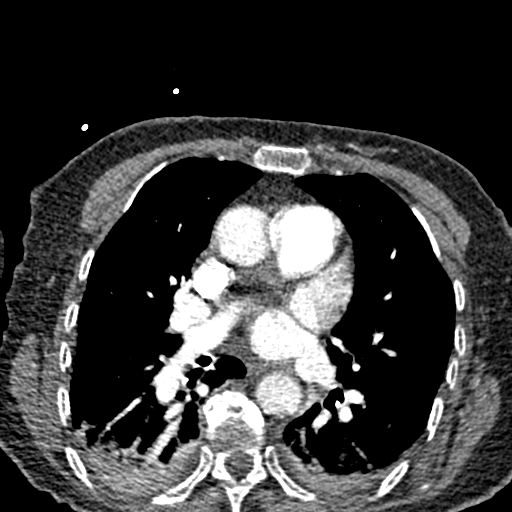
[im 195/293  lung]
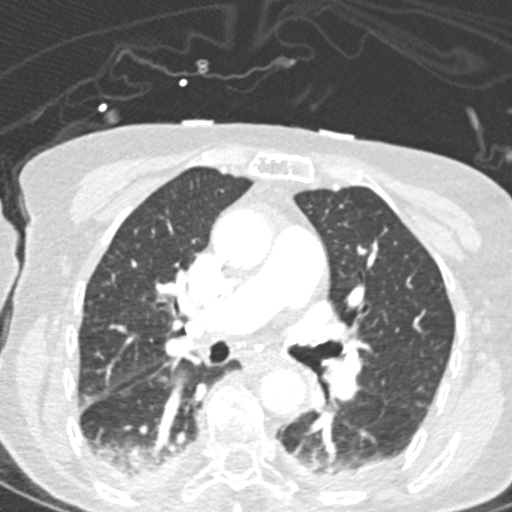
[im 228/293  soft-tissue]
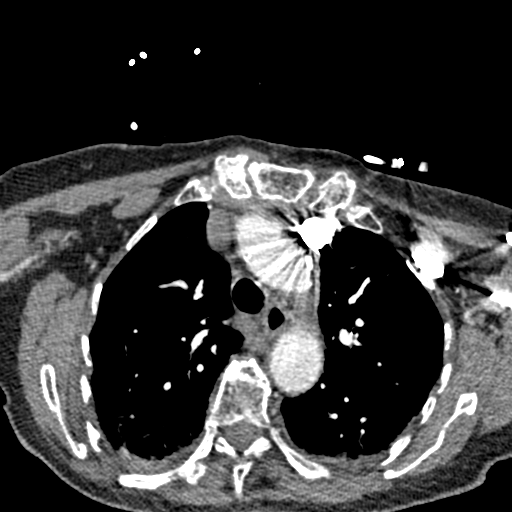
[im 244/293  lung]
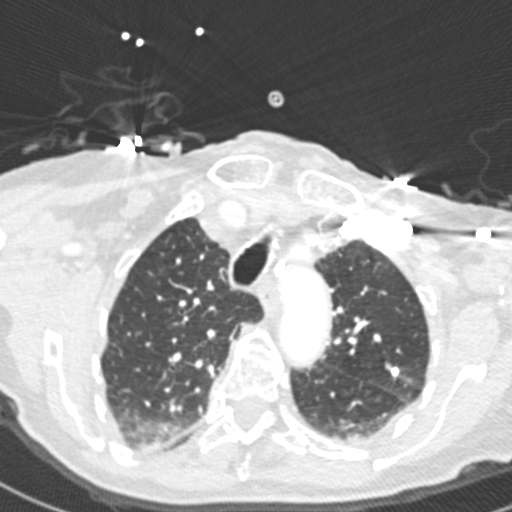
[im 260/293  soft-tissue]
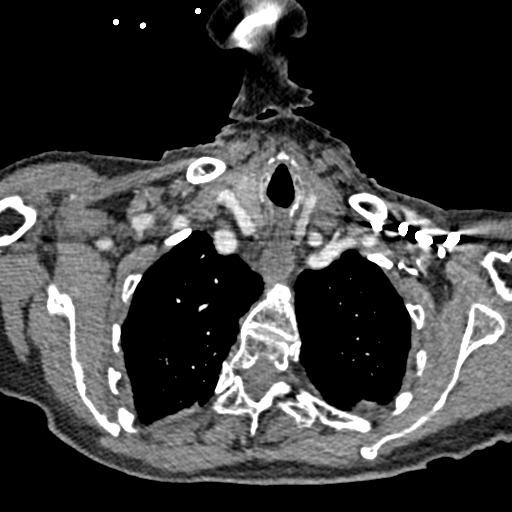
[im 276/293  lung]
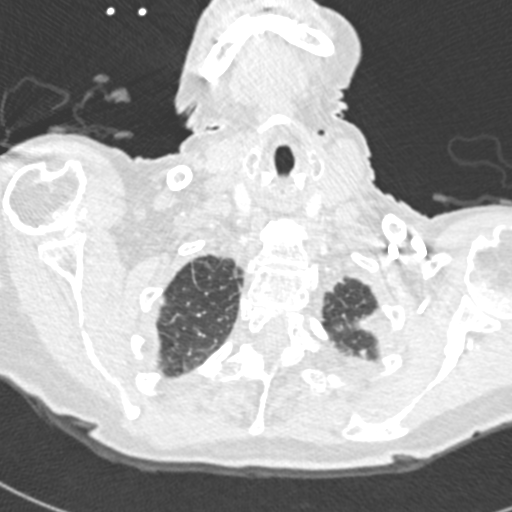

[Series 7: coronal mpr · coronal · 0.55mm/px · 3 of 104 slices shown]
[im 26/104  soft-tissue]
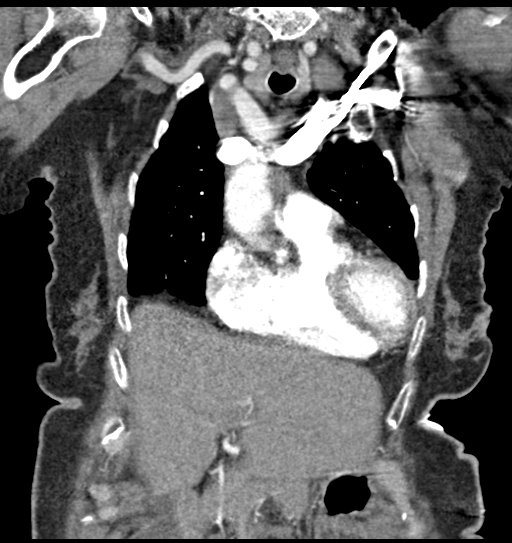
[im 52/104  soft-tissue]
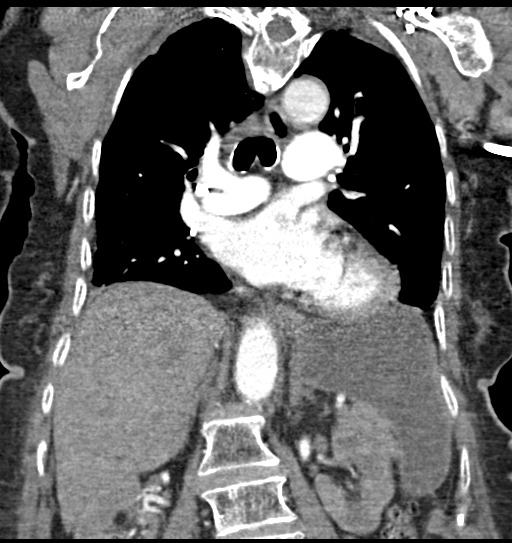
[im 78/104  soft-tissue]
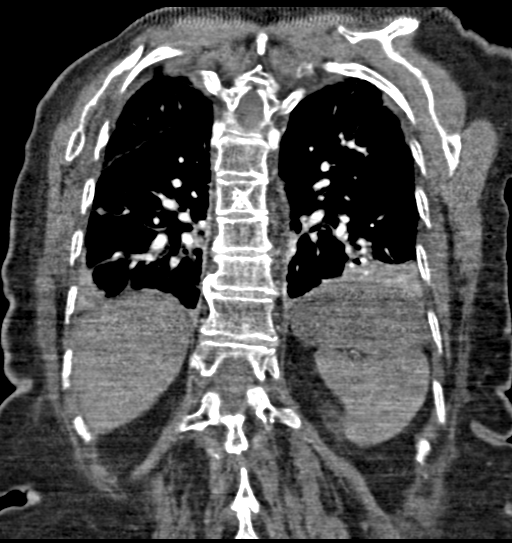

[18 of 46 positions shown; findings below may reference images not displayed]

FINDINGS: There is no evidence of pulmonary embolus.

Dependent bilateral lower lobe airspace opacification may reflect
atelectasis or pneumonia. Scattered peripheral nodular densities
likely reflect atelectasis. Scattered bilateral calcified
granulomata are seen. There is no evidence of pleural effusion or
pneumothorax. No masses are identified; no abnormal focal contrast
enhancement is seen.

Calcified nodes are noted at the subcarinal region, and about the
distal esophagus. No mediastinal lymphadenopathy is seen. No
pericardial effusion is identified. Scattered calcification is noted
along the aortic arch. The great vessels are grossly unremarkable in
appearance. No axillary lymphadenopathy is seen. The visualized
portions of the thyroid gland are unremarkable in appearance.

The visualized portions of the liver and spleen are unremarkable.
The visualized portions of the pancreas, gallbladder, stomach,
adrenal glands and kidneys are within normal limits. Scattered
diverticulosis is noted at the splenic flexure of the colon.

No acute osseous abnormalities are seen. There is mild chronic
anterior fusion of multiple thoracic vertebral bodies.

Review of the MIP images confirms the above findings.
IMPRESSION: 1. No evidence of pulmonary embolus.
2. Dependent bilateral lower lobe airspace opacification may reflect
atelectasis or pneumonia.
3. Calcified nodes at the subcarinal region, and about the distal
esophagus, likely reflecting remote granulomatous disease. No
mediastinal lymphadenopathy seen.
4. Scattered diverticulosis at the splenic flexure of the colon.
5. Mild chronic anterior fusion of multiple thoracic vertebral
bodies.

## 2016-05-17 IMAGING — CR DG ABDOMEN 2V
3 series · 3 of 3 positions shown · non-contrast
Comparison: Chest x-ray and CT PE study performed earlier today

CLINICAL DATA: 89-year-old female with abdominal pain, nausea and
vomiting

EXAM:
ABDOMEN - 2 VIEW

[abdomen erect]
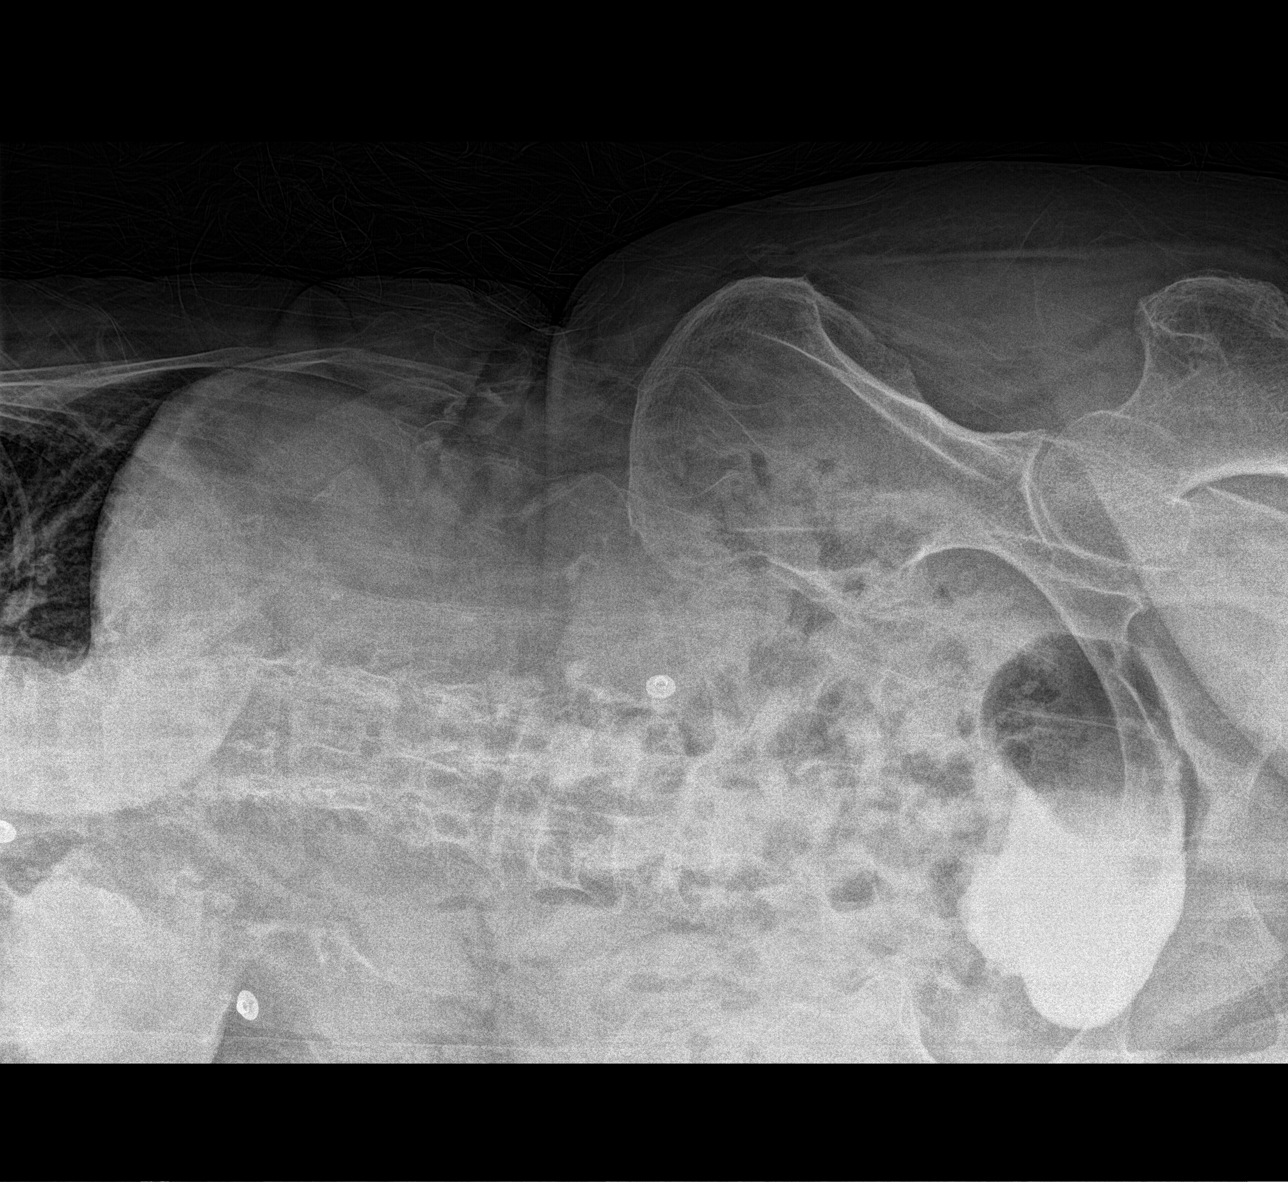

[abdomen supine]
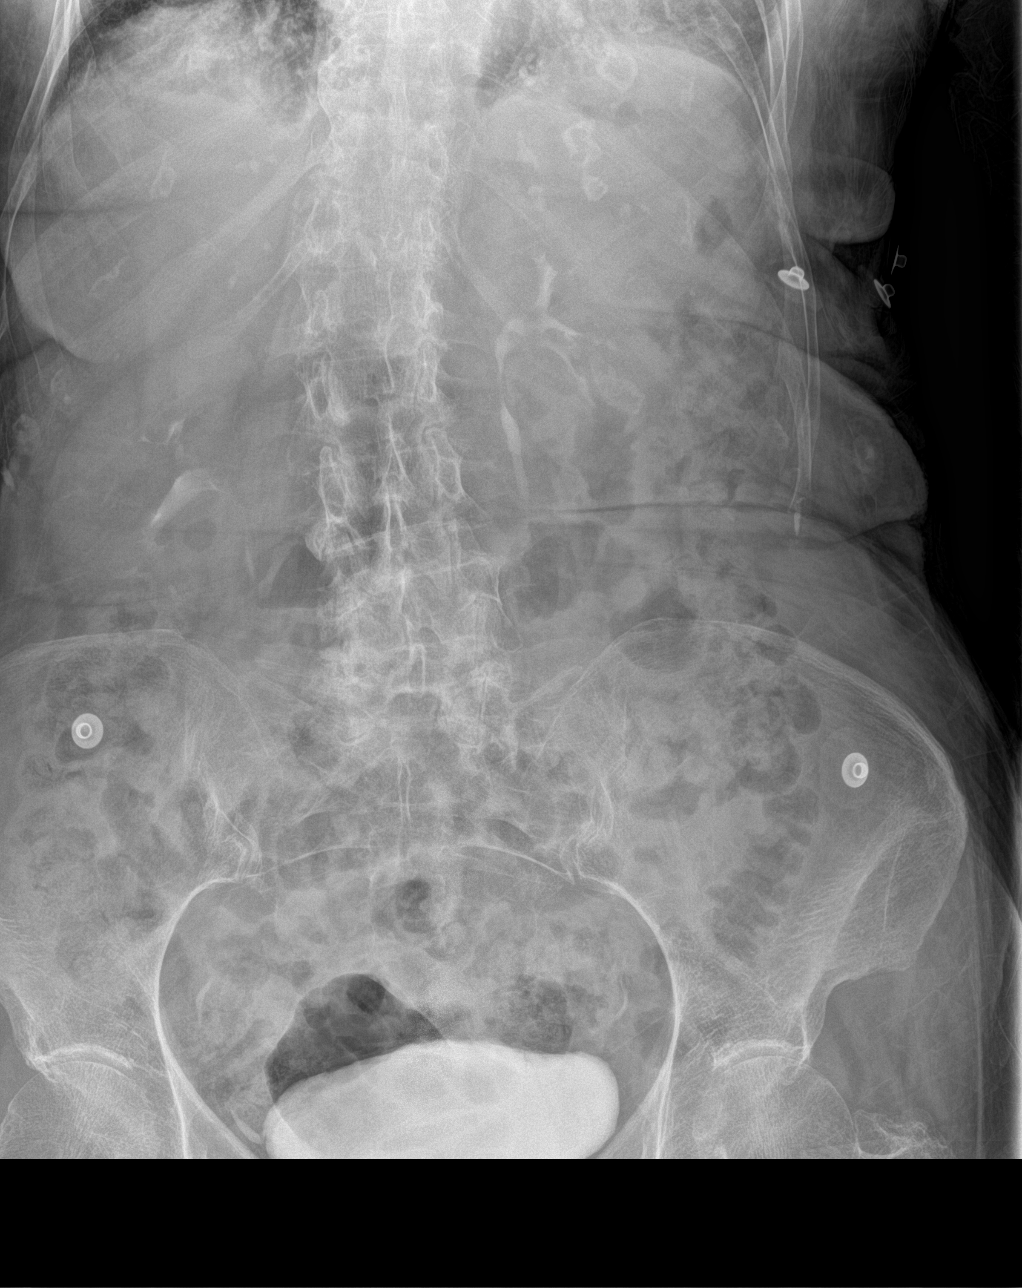

[abdomen decu]
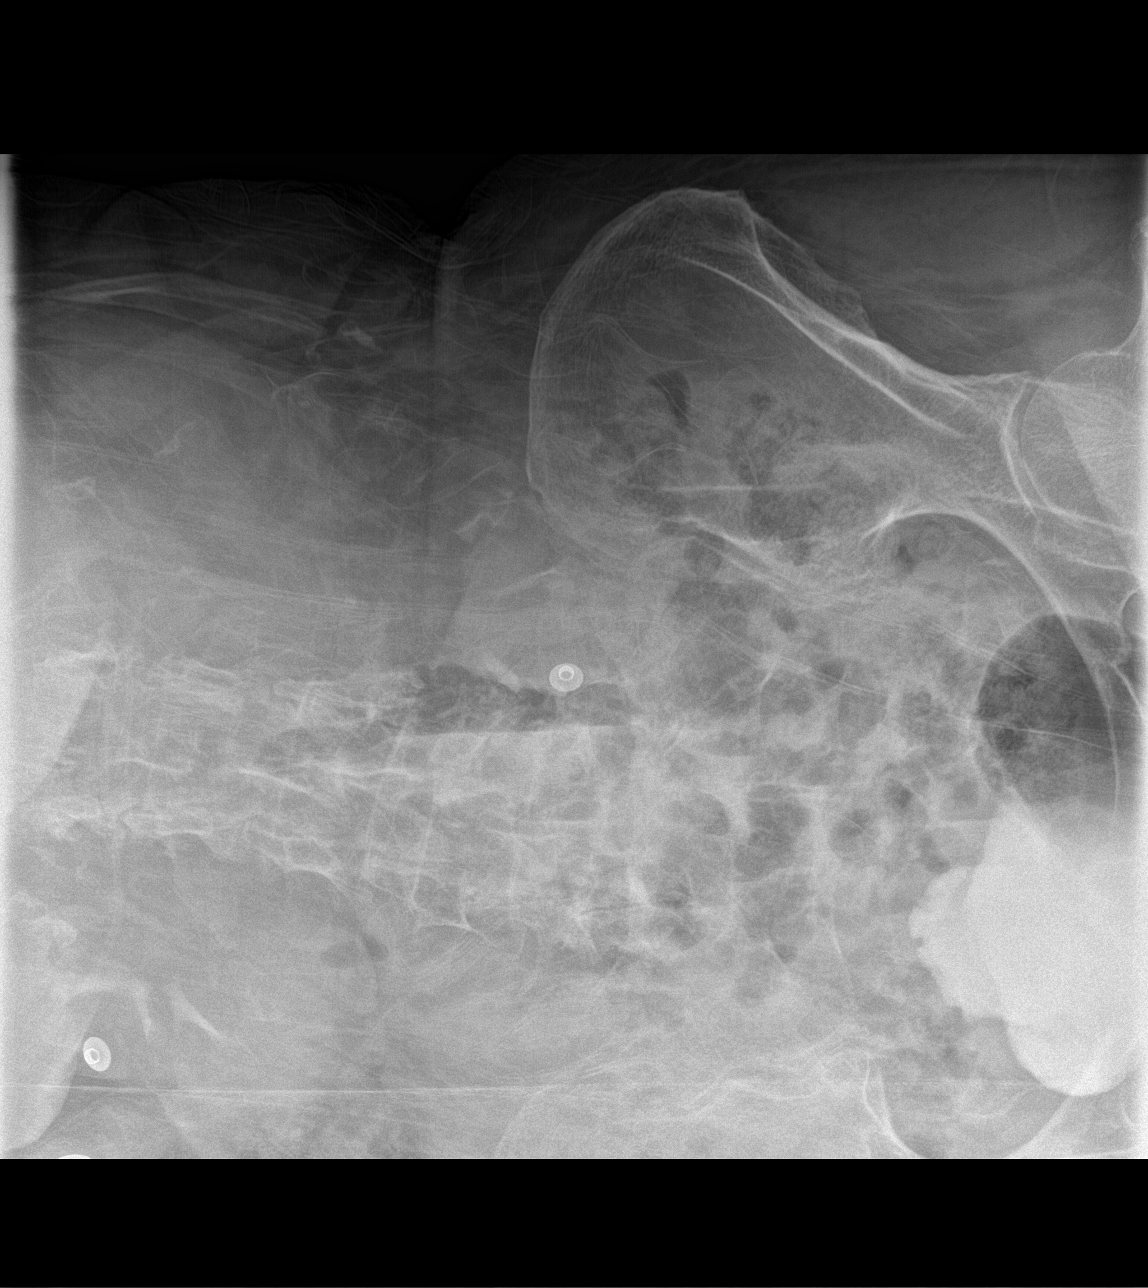

[3 of 3 positions shown; findings below may reference images not displayed]

FINDINGS: Lateral decubitus radiograph demonstrates no evidence of free air.
The bones are diffusely osteopenic. Contrast material present within
the bladder. No evidence of bowel obstruction. Partial opacification
of the bilateral renal collecting systems. Multilevel degenerative
disc disease and dextro convex scoliosis.
IMPRESSION: 1. No evidence of obstruction or free air.

## 2016-06-07 DIAGNOSIS — B353 Tinea pedis: Secondary | ICD-10-CM | POA: Diagnosis not present

## 2016-06-07 DIAGNOSIS — L03031 Cellulitis of right toe: Secondary | ICD-10-CM | POA: Diagnosis not present

## 2016-06-07 DIAGNOSIS — M79672 Pain in left foot: Secondary | ICD-10-CM | POA: Diagnosis not present

## 2016-06-07 DIAGNOSIS — M79671 Pain in right foot: Secondary | ICD-10-CM | POA: Diagnosis not present

## 2016-06-16 DIAGNOSIS — M79672 Pain in left foot: Secondary | ICD-10-CM | POA: Diagnosis not present

## 2016-06-16 DIAGNOSIS — M79671 Pain in right foot: Secondary | ICD-10-CM | POA: Diagnosis not present

## 2016-06-25 DIAGNOSIS — R109 Unspecified abdominal pain: Secondary | ICD-10-CM | POA: Diagnosis not present

## 2016-06-25 DIAGNOSIS — M62830 Muscle spasm of back: Secondary | ICD-10-CM | POA: Diagnosis not present

## 2016-07-06 ENCOUNTER — Other Ambulatory Visit: Payer: Self-pay

## 2016-07-06 MED ORDER — PRIMIDONE 50 MG PO TABS: 150.0000 mg | ORAL_TABLET | Freq: Every day | ORAL | 0 refills | Status: DC

## 2016-07-06 NOTE — Telephone Encounter (Signed)
E-scribed 90 day refill per faxed request from pharmacy.

## 2016-07-30 DIAGNOSIS — Z961 Presence of intraocular lens: Secondary | ICD-10-CM | POA: Diagnosis not present

## 2016-07-30 DIAGNOSIS — H401131 Primary open-angle glaucoma, bilateral, mild stage: Secondary | ICD-10-CM | POA: Diagnosis not present

## 2016-08-23 DIAGNOSIS — M4316 Spondylolisthesis, lumbar region: Secondary | ICD-10-CM | POA: Diagnosis not present

## 2016-08-23 DIAGNOSIS — M5136 Other intervertebral disc degeneration, lumbar region: Secondary | ICD-10-CM | POA: Diagnosis not present

## 2016-08-26 ENCOUNTER — Other Ambulatory Visit: Payer: Self-pay | Admitting: Neurology

## 2016-08-27 DIAGNOSIS — L03032 Cellulitis of left toe: Secondary | ICD-10-CM | POA: Diagnosis not present

## 2016-09-06 ENCOUNTER — Telehealth: Payer: Self-pay | Admitting: Neurology

## 2016-09-06 DIAGNOSIS — L03032 Cellulitis of left toe: Secondary | ICD-10-CM | POA: Diagnosis not present

## 2016-09-06 MED ORDER — PRIMIDONE 50 MG PO TABS: 150.0000 mg | ORAL_TABLET | Freq: Every day | ORAL | 0 refills | Status: DC

## 2016-09-06 MED ORDER — PROPRANOLOL HCL 20 MG PO TABS
20.0000 mg | ORAL_TABLET | Freq: Two times a day (BID) | ORAL | 0 refills | Status: DC
Start: 2016-09-06 — End: 2016-10-06

## 2016-09-06 NOTE — Telephone Encounter (Signed)
Pt daughter calling re: a refill being needed on these 2.primidone (MYSOLINE) 50 MG tablet& propranolol (INDERAL) 10 MG tablet  A check up appointment has been scheduled for the end of this month, pt daughter is asking for a call to ensure that they can be refilled since a check up has been scheduled for pt to see Dr Lucia GaskinsAhern, please call

## 2016-09-06 NOTE — Telephone Encounter (Signed)
30 day refills e-scribed to pt's pharmacy.

## 2016-09-06 NOTE — Addendum Note (Signed)
Addended by: Donnelly AngelicaHOGAN, Travares Nelles L on: 09/06/2016 11:41 AM   Modules accepted: Orders

## 2016-10-04 ENCOUNTER — Ambulatory Visit (INDEPENDENT_AMBULATORY_CARE_PROVIDER_SITE_OTHER): Payer: Medicare Other | Admitting: Neurology

## 2016-10-04 ENCOUNTER — Encounter: Payer: Self-pay | Admitting: Neurology

## 2016-10-04 VITALS — BP 136/63 | HR 86 | Wt 131.8 lb

## 2016-10-04 DIAGNOSIS — E860 Dehydration: Secondary | ICD-10-CM | POA: Diagnosis not present

## 2016-10-04 DIAGNOSIS — G250 Essential tremor: Secondary | ICD-10-CM

## 2016-10-04 DIAGNOSIS — N3281 Overactive bladder: Secondary | ICD-10-CM | POA: Diagnosis not present

## 2016-10-04 NOTE — Progress Notes (Signed)
[facility]    Provider:  Dr Lucia Gaskins Referring Provider: [name], MD Primary Care Physician:  [name], MD  CC: tremor  Interval history: propranolol is making her tired, will discontinue. Her hands are still better. But her chin tremor is ongoing. She is seeing orthopaedics for injections into CTS. She wears braces at night. She cannot tolerate more primidone.   Interval history 10/09/2015: She is on 3 pills of primidone at night. The hand tremor is better.  The chin tremor is still bad. Some sedation and feeling sluggish likely die to the primidone, discussed, cannot increase here. Discussed other medications and possibly a very low dose b-blocker. She is sleeping later in the morning. She goes to be at 11pm and 1130. She takes the pills at 10pm. She wakes up at 830. She feels sluggish. Will ask her to take primidone a little earlier. Cannot increase at this time. Discussed other options, decided on propranolol very low dose and discussed side effects at length. Continue primidone at current dose.    Interval history: She is eating more and better. She is on more of a routine at the nursing home. She is on a whole pill of primidone at night. She does not feel that the primidone is helping her however she is only on 50 mg at night. Discussed that we may have to increase this medication more than not to see results. Propranolol the past helped but was discontinued due to side effects. Discussed with daughter and patient again.She denies any side effects at all to the primidone. Let daughter know that she doesn't have to wait several months if the medication is not working, they can call me and I'm happy to increase it. We'll plan on increasing the primidone very slowly to 100 mg at night. They understand that we may have to go up much more than that and that this may be limited by side effects. Can cause sedation in the middle of the night, advised patient to  keep lights on, and if she has any side effects or any increased sedation that she should go back to the last dose of the primidone that was tolerated. Discussed other medications we could try including Neurontin, Keppra, Topamax for essential tremor. Propranolol and primidone are the most effective however Neurontin often works as well.  HPI: AYANE DELANCEY is a 83 y.o. female here as a referral from Dr. Zachery Dauer for tremor. PMHx of CAP, asymptomatic carotid artery disease, Chronic venous insufficiency, Acute resp failure with hypoxia, HTN, dizziness, glucose intolerance, osteoporosis, benign essential tremor, anemia, allergic rhinitis,, HLD, glaucoma. She has had a tremor for years. Started 10 years ago in the right hand and then to the left hand then to the face. Worsening over the last year. No pain just very annoying. Continuous. It is embarrassing. She was on a b-blocker and that was discontinued. She was dizzy on the b-blocker with hypotension. The beta blocker help the tremor, Worsened since stopping the b-blocker. The tremor is continuous. Her mohter had a tremor as well as her brother. Slowly progressive. Daughter is with her and also provides information.  Reviewed notes, labs and imaging from outside physicians, which showed:  Cbc with anemia (9.5/28.1), BMP unremarkable, B12 745, TSH 3.120  MRI of the brain 2005: MRI BRAIN WO/W CONTRAST 15 cc IV Omniscan. No comparison. There is moderate cortical atrophy. There are scattered small subcortical and periventricular white matter hyperintensities as well as a 1 cm hyperintensity in the left  inferior temporal lobe. 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 There also is some minimal patchy hyperintensity in the pons bilaterally compatible with chronic ischemic change. Diffusion weighted imaging is negative for acute infarct. The enhancement pattern is normal and there is no mass. IMPRESSION Chronic small vessel ischemic changes. No acute  abnormalities  [facility]. She also has dyslipidemia and impaired fasting glucose. Patient (solving [place]. Spent a good move for her. She enjoys activities in the company. She the dining hall twice a day is probably eating better but is also exercising more. She has a history of benign essential tremor primarily in her jaw. She was on a beta blocker without discontinued due to low blood pressure and becoming dizzy. He frequently clears her throat has a lot of sneezing or blowing her nose.   Review of Systems: Patient complains of symptoms per HPI as well as the following symptoms: Tremor, hearing loss. Pertinent negatives per HPI. All others negative.  Social History   Social History  . Marital status: Widowed    Spouse name: N/A  . Number of children: 3  . Years of education: 6016   Occupational History  . 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 Smoking status: Never Smoker  . Smokeless tobacco: Never Used  . Alcohol use No     Comment: rare  . Drug use: No  . Sexual activity: No   Other Topics Concern  . Not on file   Social History Narrative   [facility]   Widowed   Never smoked   Alcohol none   Living Will, POA    Family History  Problem Relation Age of Onset  . Hypertension Mother   . Heart disease Mother   . Hyperlipidemia Brother   . Hypertension Brother     Past Medical History:  Diagnosis Date  . Arthritis   . Carotid artery occlusion   . Cataract   . Glaucoma   . High cholesterol   . Hypertension   . Osteoporosis   . Pneumonia July 03, 2014  . Syncope 03/15/2016  . UTI (urinary tract infection)     Past Surgical History:  Procedure Laterality Date  . ABDOMINAL HYSTERECTOMY  1969  . APPENDECTOMY    . EYE SURGERY      Current Outpatient Prescriptions  Medication Sig Dispense Refill  . acetaminophen (TYLENOL) 325 MG tablet Take 2 tablets (650 mg total) by  mouth every 6 (six) hours as needed for mild pain (or Fever >/= 101). 30 tablet 0  . acetaminophen (TYLENOL) 500 MG tablet Take 500 mg by mouth. Take one tablet at bedtime and 3 am    . aspirin EC 81 MG tablet Take 81 mg by mouth daily.    . calcium carbonate (OS-CAL) 600 MG TABS Take 600 mg by mouth 2 (two) times daily with a meal.    . loratadine (CLARITIN) 10 MG tablet Take 10 mg by mouth daily.    . Menthol, Topical Analgesic, (BIOFREEZE ROLL-ON) 4 % GEL Apply topically. Apply to right knee as needed    . Multiple Vitamins-Minerals (MULTIVITAMIN WITH MINERALS) tablet Take 1 tablet by mouth daily.    . pramipexole (MIRAPEX) 0.125 MG tablet Take 0.125 mg by mouth. Take one tablet at bedtime for restless leg syndrome    . primidone (MYSOLINE) 50 MG tablet Take 3 tablets (150 mg total) by mouth at bedtime. 90 tablet 0  .  propranolol (INDERAL) 20 MG tablet Take 1 tablet (20 mg total) by mouth 2 (two) times daily. 60 tablet 0  . raloxifene (EVISTA) 60 MG tablet Take 60 mg by mouth at bedtime.     . timolol (TIMOPTIC) 0.5 % ophthalmic solution Place 1 drop into both eyes daily.   1  . VYTORIN 10-20 MG tablet Take 1 tablet by mouth daily.  0   No current facility-administered medications for this visit.     Allergies as of 10/04/2016 - Review Complete 10/04/2016  Allergen Reaction Noted  . Mycostatin [nystatin] Swelling and Rash 11/28/2011    Vitals: BP 136/63   Pulse 86   Wt 131 lb 12.8 oz (59.8 kg)   BMI 24.90 kg/m  Last Weight:  Wt Readings from Last 1 Encounters:  10/04/16 131 lb 12.8 oz (59.8 kg)   Last Height:   Ht Readings from Last 1 Encounters:  04/02/16 5\' 1"  (1.549 m)     Assessment/Plan: Patient is a lovely 83 year old female here with familial essential tremor.   - Discontinue beta blockers - continue primidone - will hold off on any med changes, can try to increase primidone but worry about falls - patient, daughter and I discussed medical management for  essential tremors and that all the medications have risk for sedation and falls. The hands are much better. The mouth tremor doesn;t bother her but she feels self conscious. We can try and increase primidone at a later time - she is waking up hourly to use the bathroom, recommend discussion with pcp possibly for medication as this is a fall risk. Discussed limiting fluids after 5pm  - she is not drinking enough fluids, recent UTI. Discussed hydration.  Naomie DeanAntonia Cyree Chuong, MD  [facility] [address] [place] [id number]  Phone [phone] Fax [phone]  A total of 25 minutes was spent face-to-face with this patient. Over half this time was spent on counseling patient on the tremor, overactive bladder, dehydration diagnosis and different diagnostic and therapeutic options available.

## 2016-10-04 NOTE — Patient Instructions (Signed)
Remember to drink plenty of fluid, eat healthy meals and do not skip any meals. Try to eat protein with a every meal and eat a healthy snack such as fruit or nuts in between meals. Try to keep a regular sleep-wake schedule and try to exercise daily, particularly in the form of walking, 20-30 minutes a day, if you can.   As far as your medications are concerned, I would like to suggest: Continue current medications  I would like to see you back as needed, sooner if we need to. Please call us with any interim questions, concerns, problems, updates or refill requests.   Our phone number is [phone]. We also have an after hours call service for urgent matters and there is a physician on-call for urgent questions. For any emergencies you know to call 911 or go to the nearest emergency room   

## 2016-10-05 ENCOUNTER — Observation Stay (HOSPITAL_COMMUNITY)
Admission: EM | Admit: 2016-10-05 | Discharge: 2016-10-06 | Disposition: A | Discharge status: Home / Self Care | Payer: Medicare Other | Attending: Internal Medicine | Admitting: Internal Medicine

## 2016-10-05 ENCOUNTER — Encounter (HOSPITAL_COMMUNITY): Payer: Self-pay

## 2016-10-05 DIAGNOSIS — G2581 Restless legs syndrome: Secondary | ICD-10-CM | POA: Diagnosis not present

## 2016-10-05 DIAGNOSIS — R351 Nocturia: Secondary | ICD-10-CM | POA: Diagnosis not present

## 2016-10-05 DIAGNOSIS — I10 Essential (primary) hypertension: Secondary | ICD-10-CM | POA: Insufficient documentation

## 2016-10-05 DIAGNOSIS — M810 Age-related osteoporosis without current pathological fracture: Secondary | ICD-10-CM | POA: Insufficient documentation

## 2016-10-05 DIAGNOSIS — D649 Anemia, unspecified: Secondary | ICD-10-CM | POA: Diagnosis present

## 2016-10-05 DIAGNOSIS — Z79899 Other long term (current) drug therapy: Secondary | ICD-10-CM | POA: Insufficient documentation

## 2016-10-05 DIAGNOSIS — H409 Unspecified glaucoma: Secondary | ICD-10-CM | POA: Diagnosis not present

## 2016-10-05 DIAGNOSIS — E7800 Pure hypercholesterolemia, unspecified: Secondary | ICD-10-CM | POA: Insufficient documentation

## 2016-10-05 DIAGNOSIS — Z7982 Long term (current) use of aspirin: Secondary | ICD-10-CM | POA: Insufficient documentation

## 2016-10-05 DIAGNOSIS — D509 Iron deficiency anemia, unspecified: Secondary | ICD-10-CM | POA: Diagnosis not present

## 2016-10-05 DIAGNOSIS — G250 Essential tremor: Secondary | ICD-10-CM | POA: Diagnosis not present

## 2016-10-05 DIAGNOSIS — E782 Mixed hyperlipidemia: Secondary | ICD-10-CM | POA: Diagnosis not present

## 2016-10-05 DIAGNOSIS — Z888 Allergy status to other drugs, medicaments and biological substances status: Secondary | ICD-10-CM | POA: Diagnosis not present

## 2016-10-05 DIAGNOSIS — M1990 Unspecified osteoarthritis, unspecified site: Secondary | ICD-10-CM | POA: Diagnosis not present

## 2016-10-05 DIAGNOSIS — R7301 Impaired fasting glucose: Secondary | ICD-10-CM | POA: Diagnosis not present

## 2016-10-05 DIAGNOSIS — E559 Vitamin D deficiency, unspecified: Secondary | ICD-10-CM | POA: Diagnosis not present

## 2016-10-05 DIAGNOSIS — E785 Hyperlipidemia, unspecified: Secondary | ICD-10-CM | POA: Diagnosis present

## 2016-10-05 DIAGNOSIS — I2510 Atherosclerotic heart disease of native coronary artery without angina pectoris: Secondary | ICD-10-CM | POA: Insufficient documentation

## 2016-10-05 LAB — COMPREHENSIVE METABOLIC PANEL
ALT: 16 U/L (ref 14–54)
AST: 20 U/L (ref 15–41)
Albumin: 3.9 g/dL (ref 3.5–5.0)
Alkaline Phosphatase: 47 U/L (ref 38–126)
Anion gap: 8 (ref 5–15)
BUN: 21 mg/dL — ABNORMAL HIGH (ref 6–20)
CO2: 24 mmol/L (ref 22–32)
Calcium: 8.8 mg/dL — ABNORMAL LOW (ref 8.9–10.3)
Chloride: 103 mmol/L (ref 101–111)
Creatinine, Ser: 0.74 mg/dL (ref 0.44–1.00)
GFR calc Af Amer: >60 mL/min (ref >60)
GFR calc non Af Amer: >60 mL/min (ref >60)
Glucose, Bld: 107 mg/dL — ABNORMAL HIGH (ref 65–99)
Potassium: 4.4 mmol/L (ref 3.5–5.1)
Sodium: 135 mmol/L (ref 135–145)
Total Bilirubin: 0.1 mg/dL — ABNORMAL LOW (ref 0.3–1.2)
Total Protein: 6.3 g/dL — ABNORMAL LOW (ref 6.5–8.1)

## 2016-10-05 LAB — RETICULOCYTES
RBC.: 2.46 MIL/uL — ABNORMAL LOW (ref 3.87–5.11)
Retic Count, Absolute: 36.9 10*3/uL (ref 19.0–186.0)
Retic Ct Pct: 1.5 % (ref 0.4–3.1)

## 2016-10-05 LAB — CBC WITH DIFFERENTIAL/PLATELET
Basophils Absolute: 0 10*3/uL (ref 0.0–0.1)
Basophils Relative: 0 %
Eosinophils Absolute: 0.1 10*3/uL (ref 0.0–0.7)
Eosinophils Relative: 1 %
HCT: 20 % — ABNORMAL LOW (ref 36.0–46.0)
Hemoglobin: 6.1 g/dL — CL (ref 12.0–15.0)
Lymphocytes Relative: 17 %
Lymphs Abs: 1 10*3/uL (ref 0.7–4.0)
MCH: 23.1 pg — ABNORMAL LOW (ref 26.0–34.0)
MCHC: 30.5 g/dL (ref 30.0–36.0)
MCV: 75.8 fL — ABNORMAL LOW (ref 78.0–100.0)
Monocytes Absolute: 0.8 10*3/uL (ref 0.1–1.0)
Monocytes Relative: 13 %
Neutro Abs: 4.2 10*3/uL (ref 1.7–7.7)
Neutrophils Relative %: 70 %
Platelets: 218 10*3/uL (ref 150–400)
RBC: 2.64 MIL/uL — ABNORMAL LOW (ref 3.87–5.11)
RDW: 16.7 % — ABNORMAL HIGH (ref 11.5–15.5)
WBC: 6 10*3/uL (ref 4.0–10.5)

## 2016-10-05 LAB — POC OCCULT BLOOD, ED: Fecal Occult Bld: NEGATIVE

## 2016-10-05 LAB — PREPARE RBC (CROSSMATCH)

## 2016-10-05 LAB — ABO/RH: ABO/RH(D): A POS

## 2016-10-05 MED ORDER — CALCIUM CARBONATE 1500 (600 CA) MG PO TABS
600.0000 mg | ORAL_TABLET | Freq: Two times a day (BID) | ORAL | Status: DC
Start: 2016-10-06 — End: 2016-10-06
  Filled 2016-10-05: qty 1

## 2016-10-05 MED ORDER — LORATADINE 10 MG PO TABS
10.0000 mg | ORAL_TABLET | Freq: Every day | ORAL | Status: DC
  Administered 2016-10-06: 10 mg via ORAL
  Filled 2016-10-05: qty 1

## 2016-10-05 MED ORDER — SODIUM CHLORIDE 0.9 % IV SOLN: Freq: Once | INTRAVENOUS | Status: DC

## 2016-10-05 MED ORDER — PROPRANOLOL HCL 20 MG PO TABS
20.0000 mg | ORAL_TABLET | Freq: Every day | ORAL | Status: DC
  Administered 2016-10-06: 20 mg via ORAL
  Filled 2016-10-05: qty 1

## 2016-10-05 MED ORDER — ASPIRIN EC 81 MG PO TBEC
81.0000 mg | DELAYED_RELEASE_TABLET | Freq: Every day | ORAL | Status: DC
  Administered 2016-10-06: 81 mg via ORAL
  Filled 2016-10-05: qty 1

## 2016-10-05 MED ORDER — ACETAMINOPHEN 325 MG PO TABS: 650.0000 mg | ORAL_TABLET | Freq: Four times a day (QID) | ORAL | Status: DC | PRN

## 2016-10-05 MED ORDER — TIMOLOL MALEATE 0.5 % OP SOLN
1.0000 [drp] | Freq: Every day | OPHTHALMIC | Status: DC
  Administered 2016-10-06: 1 [drp] via OPHTHALMIC
  Filled 2016-10-05: qty 5

## 2016-10-05 MED ORDER — PRAMIPEXOLE DIHYDROCHLORIDE 0.25 MG PO TABS
0.1250 mg | ORAL_TABLET | Freq: Every day | ORAL | Status: DC
  Administered 2016-10-06: 0.125 mg via ORAL
  Filled 2016-10-05 (×2): qty 0.5

## 2016-10-05 MED ORDER — RALOXIFENE HCL 60 MG PO TABS
60.0000 mg | ORAL_TABLET | Freq: Every day | ORAL | Status: DC
  Administered 2016-10-06: 60 mg via ORAL
  Filled 2016-10-05 (×2): qty 1

## 2016-10-05 MED ORDER — ALPRAZOLAM 0.25 MG PO TABS
0.2500 mg | ORAL_TABLET | ORAL | Status: AC | PRN
  Administered 2016-10-06 (×2): 0.25 mg via ORAL
  Filled 2016-10-05 (×2): qty 1

## 2016-10-05 MED ORDER — PRIMIDONE 50 MG PO TABS
150.0000 mg | ORAL_TABLET | Freq: Every day | ORAL | Status: DC
  Administered 2016-10-06: 150 mg via ORAL
  Filled 2016-10-05 (×2): qty 3

## 2016-10-05 MED ORDER — EZETIMIBE-SIMVASTATIN 10-20 MG PO TABS
1.0000 | ORAL_TABLET | Freq: Every day | ORAL | Status: DC
  Administered 2016-10-06: 1 via ORAL
  Filled 2016-10-05: qty 1

## 2016-10-05 NOTE — Progress Notes (Signed)
Attending at bedside.

## 2016-10-05 NOTE — H&P (Signed)
History and Physical    Lisa [name] ZOX:[id number] DOB: 1923-08-23 DOA: 10/05/2016  PCP: [name], MD   Patient coming from: Home.  I have personally briefly reviewed patient's old medical records in [facility]  Chief Complaint: Abnormal blood tests.  HPI: Lisa [name] is a 83 y.o. female with medical history significant of osteoarthritis, coronary artery disease, cataracts, glaucoma, hyperlipidemia, hypertension, osteoporosis, history of pneumonia, history of syncope who is coming to the emergency department after her physician called her and told her about her hemoglobin level being down to 6.5 g/dL. Back in January this year, the patient's hemoglobin level was 9.5 g/dL. She endorses progressively worse fatigue, dyspnea on exertion, but denies fever, chills, chest pain, palpitations, dizziness, diaphoresis, pitting edema lower extremities, PND or orthopnea. She denies abdominal pain, nausea, emesis, diarrhea, melena or hematochezia. She complains of nocturia, but denies dysuria or hematuria. She denies blurred vision, polydipsia or polyuria.  ED Course: Initial vital signs in the emergency department temperature 98.2, pulse 91, respirations 18, blood pressure 115/66 mmHg and O2 sat 100% on room air. WBC 6.0, hemoglobin 6.1 g/dL and platelets 811. Her BMP was normal, except for nonfasting blood glucose of 107 mg/dL.  Review of Systems: As per HPI otherwise 10 point review of systems negative.  Past Medical History:  Diagnosis Date  . Arthritis   . Carotid artery occlusion   . Cataract   . Glaucoma   . High cholesterol   . Hypertension   . Osteoporosis   . Pneumonia July 03, 2014  . Syncope 03/15/2016  . UTI (urinary tract infection)     Past Surgical History:  Procedure Laterality Date  . ABDOMINAL HYSTERECTOMY  1969  . APPENDECTOMY    . EYE SURGERY       reports that she has never smoked. She has never used smokeless tobacco. She reports that she does not  drink alcohol or use drugs.  Allergies  Allergen Reactions  . Mycostatin [Nystatin] Swelling and Rash    Family History  Problem Relation Age of Onset  . Hypertension Mother   . Heart disease Mother   . Hyperlipidemia Brother   . Hypertension Brother      Prior to Admission medications   Medication Sig Start Date End Date Taking? Authorizing Provider  acetaminophen (TYLENOL) 325 MG tablet Take 2 tablets (650 mg total) by mouth every 6 (six) hours as needed for mild pain (or Fever >/= 101). 03/10/16  Yes Alison Murray, MD  aspirin EC 81 MG tablet Take 81 mg by mouth daily.   Yes [provider]  calcium carbonate (OS-CAL) 600 MG TABS Take 600 mg by mouth 2 (two) times daily with a meal.   Yes [provider]  loratadine (CLARITIN) 10 MG tablet Take 10 mg by mouth daily.   Yes [provider]  Menthol, Topical Analgesic, (BIOFREEZE ROLL-ON) 4 % GEL Apply topically. Apply to right knee as needed   Yes [provider]  Multiple Vitamins-Minerals (MULTIVITAMIN WITH MINERALS) tablet Take 1 tablet by mouth daily.   Yes [provider]  pramipexole (MIRAPEX) 0.125 MG tablet Take 0.125 mg by mouth. Take one tablet at bedtime for restless leg syndrome   Yes [provider]  primidone (MYSOLINE) 50 MG tablet Take 3 tablets (150 mg total) by mouth at bedtime. 09/06/16  Yes Anson Fret, MD  propranolol (INDERAL) 20 MG tablet Take 1 tablet (20 mg total) by mouth 2 (two) times daily. Patient  taking differently: Take 20 mg by mouth daily.  09/06/16  Yes Anson FretAhern, Antonia B, MD  raloxifene (EVISTA) 60 MG tablet Take 60 mg by mouth at bedtime.    Yes [provider]  timolol (TIMOPTIC) 0.5 % ophthalmic solution Place 1 drop into both eyes daily.  02/25/15  Yes [provider]  VYTORIN 10-20 MG tablet Take 1 tablet by mouth daily. 03/11/15  Yes [provider]    Physical Exam: Vitals:   10/05/16 2030 10/05/16 2053 10/05/16  2100 10/05/16 2153  BP: (!) 144/49 (!) 144/49 (!) 137/54 (!) 148/43  Pulse:  84 86 (!) 107  Resp: 18 (!) 23 (!) 23 20  Temp:      TempSrc:    Oral  SpO2:  95% (!) 75% 99%  Weight:      Height:        Constitutional: Elderly, frail, but in NAD, calm, comfortable Eyes: PERRL, lids and conjunctivae are pale. ENMT: Mucous membranes are moist. Posterior pharynx clear of any exudate or lesions. Partially accident dentition.  Neck: normal, supple, no masses, no thyromegaly Respiratory: clear to auscultation bilaterally, no wheezing, no crackles. Normal respiratory effort. No accessory muscle use.  Cardiovascular: Regular rate and rhythm, no murmurs / rubs / gallops. No extremity edema. 2+ pedal pulses. No carotid bruits.  Abdomen: Soft, no tenderness, no masses palpated. No hepatosplenomegaly. Bowel sounds positive.  Musculoskeletal: no clubbing / cyanosis. Positive MCP joints mild deformities.. Good ROM, no contractures. Normal muscle tone.  Skin: no significant rashes, lesions, ulcers on limited skin exam Neurologic: CN 2-12 grossly intact. Sensation intact, DTR normal. Strength 5/5 in all 4.  Psychiatric: Normal judgment and insight. Alert and oriented x 3. Normal mood.     Labs on Admission: I have personally reviewed following labs and imaging studies  CBC:  Recent Labs Lab 10/05/16 1824  WBC 6.0  NEUTROABS 4.2  HGB 6.1*  HCT 20.0*  MCV 75.8*  PLT 218   Basic Metabolic Panel:  Recent Labs Lab 10/05/16 1824  NA 135  K 4.4  CL 103  CO2 24  GLUCOSE 107*  BUN 21*  CREATININE 0.74  CALCIUM 8.8*   GFR: Estimated Creatinine Clearance: 39.3 mL/min (by C-G formula based on SCr of 0.74 mg/dL). Liver Function Tests:  Recent Labs Lab 10/05/16 1824  AST 20  ALT 16  ALKPHOS 47  BILITOT 0.1*  PROT 6.3*  ALBUMIN 3.9   No results for input(s): LIPASE, AMYLASE in the last 168 hours. No results for input(s): AMMONIA in the last 168 hours. Coagulation Profile: No  results for input(s): INR, PROTIME in the last 168 hours. Cardiac Enzymes: No results for input(s): CKTOTAL, CKMB, CKMBINDEX, TROPONINI in the last 168 hours. BNP (last 3 results) No results for input(s): PROBNP in the last 8760 hours. HbA1C: No results for input(s): HGBA1C in the last 72 hours. CBG: No results for input(s): GLUCAP in the last 168 hours. Lipid Profile: No results for input(s): CHOL, HDL, LDLCALC, TRIG, CHOLHDL, LDLDIRECT in the last 72 hours. Thyroid Function Tests: No results for input(s): TSH, T4TOTAL, FREET4, T3FREE, THYROIDAB in the last 72 hours. Anemia Panel:  Recent Labs  10/05/16 2036  RETICCTPCT 1.5   Urine analysis:    Component Value Date/Time   COLORURINE YELLOW 03/06/2016 1535   APPEARANCEUR CLOUDY (A) 03/06/2016 1535   LABSPEC 1.029 03/06/2016 1535   PHURINE 5.5 03/06/2016 1535   GLUCOSEU NEGATIVE 03/06/2016 1535   HGBUR NEGATIVE 03/06/2016 1535   BILIRUBINUR SMALL (  A) 03/06/2016 1535   BILIRUBINUR small 11/28/2011 1136   KETONESUR NEGATIVE 03/06/2016 1535   PROTEINUR NEGATIVE 03/06/2016 1535   UROBILINOGEN 0.2 07/03/2014 1941   NITRITE NEGATIVE 03/06/2016 1535   LEUKOCYTESUR TRACE (A) 03/06/2016 1535    Radiological Exams on Admission: No results found.  EKG: Independently reviewed.   Assessment/Plan Principal Problem:   Symptomatic anemia Observation/MedSurg. Transfuse PRBCs. Follow-up hematocrit and hemoglobin. Follow-up anemia panel drawn pretransfusion. Most likely the patient will benefit from iron supplementation. She is not inclined to undergo EGD or colonoscopy.  Active Problems:   Essential tremor Continue primidone 150 mg by mouth at bedtime.. Continue propranolol 20 mg by mouth daily.    Restless legs Continue Mirapex 0.125 mg by mouth at bedtime.    Hypertension Continue propranolol 20 mg by mouth daily. Monitor blood pressure.    Glaucoma Continue Timoptic drops one drop to both eyes daily.     Hyperlipidemia Continue Vytorin 10-20 by mouth daily. Monitor LFTs periodically. Fasting lipids as an outpatient.    DVT prophylaxis: SCDs. Code Status: Full code. Family Communication: Her daughter was present in her room. Disposition Plan: Admit to transfuse 2 units of PRBCs. Consults called:  Admission status: Observation/MedSurg.   Bobette Moavid Manuel Zaela Graley MD [facility] Pager [phone]  If 7PM-7AM, please contact night-coverage [email or web address] Password [email or web address]  10/05/2016, 11:06 PM

## 2016-10-05 NOTE — ED Triage Notes (Signed)
Patient had lab work done today and PCP reported Hgb 6.5. Patient c/o not having "energy"

## 2016-10-05 NOTE — ED Provider Notes (Signed)
[facility]-EMERGENCY DEPT Provider Note   CSN: [id number] Arrival date & time: 10/05/16  1625     History   Chief Complaint Chief Complaint  Patient presents with  . Abnormal Lab    HPI Lisa [name] is a 83 y.o. female.  83 year old female without any significant medical issues who has a history of chronic anemia had a normal follow-up with her primary doctor today and then subsequently had labs checked and found she had anemia with hemoglobin 6.5 so sent here for further evaluation. She has weakness but this is a side effect of her medications she has not had any other new symptoms such as shortness breath, weakness, dizziness, bright red blood in her stool or dark stools. No other associated or modifying symptoms.       Past Medical History:  Diagnosis Date  . Arthritis   . Carotid artery occlusion   . Cataract   . Glaucoma   . High cholesterol   . Hypertension   . Osteoporosis   . Pneumonia July 03, 2014  . Syncope 03/15/2016  . UTI (urinary tract infection)     Patient Active Problem List   Diagnosis Date Noted  . Restless legs 04/05/2016  . HOH (hard of hearing) 03/22/2016  . Knee pain, bilateral 03/22/2016  . Rash and nonspecific skin eruption 03/18/2016  . Hyponatremia 03/17/2016  . Syncope 03/15/2016  . Anxiety state 03/15/2016  . Carpal tunnel syndrome 03/15/2016  . Acute bronchitis 03/08/2016  . Normocytic anemia 03/08/2016  . Dyslipidemia 03/08/2016  . Urinary tract infection without hematuria 03/06/2016  . Essential tremor 10/12/2015  . Asymptomatic carotid artery stenosis without infarction 12/20/2013    Past Surgical History:  Procedure Laterality Date  . ABDOMINAL HYSTERECTOMY  1969  . APPENDECTOMY    . EYE SURGERY      OB History    No data available       Home Medications    Prior to Admission medications   Medication Sig Start Date End Date Taking? Authorizing Provider  acetaminophen (TYLENOL) 325 MG tablet Take 2 tablets (650 mg  total) by mouth every 6 (six) hours as needed for mild pain (or Fever >/= 101). 03/10/16   Alison Murray, MD  acetaminophen (TYLENOL) 500 MG tablet Take 500 mg by mouth. Take one tablet at bedtime and 3 am    [provider]  aspirin EC 81 MG tablet Take 81 mg by mouth daily.    [provider]  calcium carbonate (OS-CAL) 600 MG TABS Take 600 mg by mouth 2 (two) times daily with a meal.    [provider]  loratadine (CLARITIN) 10 MG tablet Take 10 mg by mouth daily.    [provider]  Menthol, Topical Analgesic, (BIOFREEZE ROLL-ON) 4 % GEL Apply topically. Apply to right knee as needed    [provider]  Multiple Vitamins-Minerals (MULTIVITAMIN WITH MINERALS) tablet Take 1 tablet by mouth daily.    [provider]  pramipexole (MIRAPEX) 0.125 MG tablet Take 0.125 mg by mouth. Take one tablet at bedtime for restless leg syndrome    [provider]  primidone (MYSOLINE) 50 MG tablet Take 3 tablets (150 mg total) by mouth at bedtime. 09/06/16   Anson Fret, MD  propranolol (INDERAL) 20 MG tablet Take 1 tablet (20 mg total) by mouth 2 (two) times daily. 09/06/16   Anson Fret, MD  raloxifene (EVISTA) 60 MG tablet Take 60 mg by mouth at bedtime.  [provider]  timolol (TIMOPTIC) 0.5 % ophthalmic solution Place 1 drop into both eyes daily.  02/25/15   [provider]  VYTORIN 10-20 MG tablet Take 1 tablet by mouth daily. 03/11/15   [provider]    Family History Family History  Problem Relation Age of Onset  . Hypertension Mother   . Heart disease Mother   . Hyperlipidemia Brother   . Hypertension Brother     Social History Social History  Substance Use Topics  . Smoking status: Never Smoker  . Smokeless tobacco: Never Used  . Alcohol use No     Comment: rare     Allergies   Mycostatin [nystatin]   Review of Systems Review of Systems  All other systems reviewed and are  negative.    Physical Exam Updated Vital Signs BP (!) 159/66 (BP Location: Right Arm)   Pulse 91   Temp 98.2 F (36.8 C) (Oral)   Resp 18   Ht 5' 1.5" (1.562 m)   Wt 59.4 kg (131 lb)   SpO2 100%   BMI 24.35 kg/m   Physical Exam  Constitutional: She is oriented to person, place, and time. She appears well-developed and well-nourished.  HENT:  Head: Normocephalic and atraumatic.  Eyes: Conjunctivae and EOM are normal.  Neck: Normal range of motion.  Cardiovascular: Normal rate and regular rhythm.  Exam reveals no friction rub.   No murmur heard. Pulmonary/Chest: No stridor. No respiratory distress.  Abdominal: Soft. She exhibits no distension.  Musculoskeletal: Normal range of motion. She exhibits no edema or deformity.  Neurological: She is alert and oriented to person, place, and time. No cranial nerve deficit. Coordination normal.  Skin: Skin is warm and dry.  Nursing note and vitals reviewed.    ED Treatments / Results  Labs (all labs ordered are listed, but only abnormal results are displayed) Labs Reviewed  CBC WITH DIFFERENTIAL/PLATELET  COMPREHENSIVE METABOLIC PANEL  TYPE AND SCREEN    EKG  EKG Interpretation None       Radiology No results found.  Procedures Procedures (including critical care time)  CRITICAL CARE Performed by: Marily MemosMesner, Dalissa Lovin Total critical care time: 35 minutes Critical care time was exclusive of separately billable procedures and treating other patients. Critical care was necessary to treat or prevent imminent or life-threatening deterioration. Critical care was time spent personally by me on the following activities: development of treatment plan with patient and/or surrogate as well as nursing, discussions with consultants, evaluation of patient's response to treatment, examination of patient, obtaining history from patient or surrogate, ordering and performing treatments and interventions, ordering and review of laboratory  studies, ordering and review of radiographic studies, pulse oximetry and re-evaluation of patient's condition.   Medications Ordered in ED Medications - No data to display   Initial Impression / Assessment and Plan / ED Course  I have reviewed the triage vital signs and the nursing notes.  Pertinent labs & imaging results that were available during my care of the patient were reviewed by me and considered in my medical decision making (see chart for details).  No symptoms, appears well, will recheck hemoglobin prior to attempting transfusion.   Clinical Course as of Oct 08 1528  Tue Oct 05, 2016  2009 Verified. Will transfuse and admit. Add on anemia panels first. Hemoccult as well.  Hemoglobin: (!!) 6.1 [JM]    Clinical Course User Index [JM] Dlisa Barnwell, [name] CowerJason, MD       Final Clinical Impressions(s) / ED  Diagnoses   Final diagnoses:  Anemia, unspecified type      Marily MemosMesner, Renette Hsu, MD 10/08/16 1530

## 2016-10-06 DIAGNOSIS — D649 Anemia, unspecified: Secondary | ICD-10-CM

## 2016-10-06 LAB — IRON AND TIBC
Iron: 13 ug/dL — ABNORMAL LOW (ref 28–170)
Saturation Ratios: 4 % — ABNORMAL LOW (ref 10.4–31.8)
TIBC: 360 ug/dL (ref 250–450)
UIBC: 347 ug/dL

## 2016-10-06 LAB — CBC
HCT: 28.3 % — ABNORMAL LOW (ref 36.0–46.0)
Hemoglobin: 9.1 g/dL — ABNORMAL LOW (ref 12.0–15.0)
MCH: 25.3 pg — ABNORMAL LOW (ref 26.0–34.0)
MCHC: 32.2 g/dL (ref 30.0–36.0)
MCV: 78.6 fL (ref 78.0–100.0)
Platelets: 187 10*3/uL (ref 150–400)
RBC: 3.6 MIL/uL — ABNORMAL LOW (ref 3.87–5.11)
RDW: 16.9 % — ABNORMAL HIGH (ref 11.5–15.5)
WBC: 5.6 10*3/uL (ref 4.0–10.5)

## 2016-10-06 LAB — FIBRINOGEN: Fibrinogen: 299 mg/dL (ref 210–475)

## 2016-10-06 LAB — LACTATE DEHYDROGENASE: LDH: 143 U/L (ref 98–192)

## 2016-10-06 LAB — DIRECT ANTIGLOBULIN TEST (NOT AT ARMC)
DAT, IgG: NEGATIVE
DAT, complement: NEGATIVE

## 2016-10-06 LAB — SAVE SMEAR

## 2016-10-06 LAB — VITAMIN B12: Vitamin B-12: 427 pg/mL (ref 180–914)

## 2016-10-06 LAB — FERRITIN: Ferritin: 5 ng/mL — ABNORMAL LOW (ref 11–307)

## 2016-10-06 LAB — FOLATE: Folate: 38.7 ng/mL (ref >5.9)

## 2016-10-06 MED ORDER — SODIUM CHLORIDE 0.9 % IV SOLN
510.0000 mg | Freq: Once | INTRAVENOUS | Status: AC
  Administered 2016-10-06: 510 mg via INTRAVENOUS
  Filled 2016-10-06: qty 17

## 2016-10-06 MED ORDER — FERROUS SULFATE 325 (65 FE) MG PO TABS: 325.0000 mg | ORAL_TABLET | Freq: Two times a day (BID) | ORAL | Status: DC

## 2016-10-06 MED ORDER — PROPRANOLOL HCL 20 MG PO TABS: 20.0000 mg | ORAL_TABLET | Freq: Every day | ORAL | 0 refills | Status: DC

## 2016-10-06 MED ORDER — CALCIUM CARBONATE 1250 (500 CA) MG PO TABS
1.0000 | ORAL_TABLET | Freq: Two times a day (BID) | ORAL | Status: DC
  Administered 2016-10-06: 500 mg via ORAL
  Filled 2016-10-06: qty 1

## 2016-10-06 MED ORDER — FERROUS SULFATE 325 (65 FE) MG PO TABS: 325.0000 mg | ORAL_TABLET | Freq: Two times a day (BID) | ORAL | 0 refills | Status: DC

## 2016-10-06 NOTE — Care Management Obs Status (Signed)
MEDICARE OBSERVATION STATUS NOTIFICATION   Patient Details  Name: Lisa [name] MRN: [id number] Date of Birth: 07-16-1923   Medicare Observation Status Notification Given:  Yes    Alexis Goodelleele, Ewen Varnell K, RN 10/06/2016, 9:28 AM

## 2016-10-06 NOTE — Progress Notes (Signed)
Spoke with patient and daughter at bedside, daughter given ABN. Discussed options, chose option 1. Daughter requested to speak with attending regarding plan of care, paged Dr Alvino Chapelhoi to speak with daughter.

## 2016-10-06 NOTE — Progress Notes (Signed)
PT Cancellation Note  Patient Details Name: Lisa [name] O Kabel MRN: [id number] DOB: Sep 14, 1923   Cancelled Treatment:    Reason Eval/Treat Not Completed: PT screened, no needs identified, will sign off. The patient and daughter both feel PT eval not indicated as patient  Had no been symptomatic re: low HGB. Patient has help line, 4 wheeled RW and attends PT at the facility for strengthening. No history of fall.  RN aware.   Rada HayHill, Tadan Shill [name] 10/06/2016, 2:56 PM Blanchard KelchKaren Honest Vanleer PT [phone]

## 2016-10-06 NOTE — Discharge Instructions (Signed)

## 2016-10-06 NOTE — Discharge Summary (Signed)
Physician Discharge Summary  Lisa [name] [id number] DOB: 1923/12/03 DOA: 10/05/2016  PCP: [name], [name], MD  Admit date: 10/05/2016 Discharge date: 10/06/2016  Admitted From: Home Disposition:  Home   Recommendations for Outpatient Follow-up:  1. Follow up with PCP in 1 week 2. Please obtain CBC in 1 week  3. Please follow up on the following pending results: haptoglobin   Home Health: No  Equipment/Devices: None   Discharge Condition: Stable CODE STATUS: Full  Diet recommendation: Heart healthy   Brief/Interim Summary: From H&P by Dr. Robb Matarrtiz: Lisa CanardNancy O Colonna is a 83 y.o. female with medical history significant of osteoarthritis, coronary artery disease, cataracts, glaucoma, hyperlipidemia, hypertension, osteoporosis, history of pneumonia, history of syncope who is coming to the emergency department after her physician called her and told her about her hemoglobin level being down to 6.5 g/dL. Back in January this year, the patient's hemoglobin level was 9.5 g/dL. She endorses progressively worse fatigue, dyspnea on exertion, but denies fever, chills, chest pain, palpitations, dizziness, diaphoresis, pitting edema lower extremities, PND or orthopnea. She denies abdominal pain, nausea, emesis, diarrhea, melena or hematochezia. She complains of nocturia, but denies dysuria or hematuria. She denies blurred vision, polydipsia or polyuria.  ED Course: Initial vital signs in the emergency department temperature 98.2, pulse 91, respirations 18, blood pressure 115/66 mmHg and O2 sat 100% on room air. WBC 6.0, hemoglobin 6.1 g/dL and platelets [id number]. Her BMP was normal, except for nonfasting blood glucose of 107 mg/dL.  Interim: Patient was transfused 2 units of packed red blood cells with improvement in hemoglobin to 9.1. Hemolytic anemia labs were unremarkable. Patient had significant iron deficiency anemia and was transfused with IV Feraheme prior to discharge. Patient was also started on  oral iron supplementation on discharge. Patient states that she has never had a colonoscopy. She last had an EGD about 3-1/2 years ago, developed aspiration pneumonia afterward. Patient and daughter are very hesitant to undergo any sort of procedures at patient's current age, and they certainly would not pursue any surgical intervention. They have elected to monitor blood counts for now with as needed blood transfusions. Patient has a follow-up with her primary care physician tomorrow, 8/2 at 3:30 PM.  Discharge Diagnoses:  Principal Problem:   Symptomatic anemia Active Problems:   Essential tremor   Restless legs   Hypertension   Glaucoma   Hyperlipidemia  Symptomatic anemia with iron deficiency  -Reports that she has had fatigue for the past 2-3 years -Baseline Hgb per report around 10 -No report of blood loss, FOBT negative -Never had colonoscopy and does not wish to pursue this currently -Last EGD 3.5 years ago was unremarkable and does not wish to pursue this currently -Hemolytic labs unremarkable  -Transfused 2u pRBC with improvement in Hgb -Feraheme administered prior to discharge, start PO iron  -Would recommend monitor CBC as outpatient with as needed transfusions   Essential tremor -Continue primidone 150 mg by mouth at bedtime -Continue propranolol 20 mg by mouth daily, this has been weaned per PCP  Restless legs -Could also be related to iron def anemia -Continue Mirapex    Discharge Instructions  Discharge Instructions    Call MD for:  difficulty breathing, headache or visual disturbances    Complete by:  As directed    Call MD for:  extreme fatigue    Complete by:  As directed    Call MD for:  hives    Complete by:  As directed    Call MD  for:  persistant dizziness or light-headedness    Complete by:  As directed    Call MD for:  persistant nausea and vomiting    Complete by:  As directed    Call MD for:  severe uncontrolled pain    Complete by:  As  directed    Call MD for:  temperature >100.4    Complete by:  As directed    Diet - low sodium heart healthy    Complete by:  As directed    Discharge instructions    Complete by:  As directed    You were cared for by a hospitalist during your hospital stay. If you have any questions about your discharge medications or the care you received while you were in the hospital after you are discharged, you can call the unit and asked to speak with the hospitalist on call if the hospitalist that took care of you is not available. Once you are discharged, your primary care physician will handle any further medical issues. Please note that NO REFILLS for any discharge medications will be authorized once you are discharged, as it is imperative that you return to your primary care physician (or establish a relationship with a primary care physician if you do not have one) for your aftercare needs so that they can reassess your need for medications and monitor your lab values.   Increase activity slowly    Complete by:  As directed      Allergies as of 10/06/2016      Reactions   Mycostatin [nystatin] Swelling, Rash      Medication List    TAKE these medications   acetaminophen 325 MG tablet Commonly known as:  TYLENOL Take 2 tablets (650 mg total) by mouth every 6 (six) hours as needed for mild pain (or Fever >/= 101).   aspirin EC 81 MG tablet Take 81 mg by mouth daily.   BIOFREEZE ROLL-ON 4 % Gel Generic drug:  Menthol (Topical Analgesic) Apply topically. Apply to right knee as needed   calcium carbonate 600 MG Tabs tablet Commonly known as:  OS-CAL Take 600 mg by mouth 2 (two) times daily with a meal.   ferrous sulfate 325 (65 FE) MG tablet Take 1 tablet (325 mg total) by mouth 2 (two) times daily with a meal.   loratadine 10 MG tablet Commonly known as:  CLARITIN Take 10 mg by mouth daily.   multivitamin with minerals tablet Take 1 tablet by mouth daily.   pramipexole 0.125 MG  tablet Commonly known as:  MIRAPEX Take 0.125 mg by mouth. Take one tablet at bedtime for restless leg syndrome   primidone 50 MG tablet Commonly known as:  MYSOLINE Take 3 tablets (150 mg total) by mouth at bedtime.   propranolol 20 MG tablet Commonly known as:  INDERAL Take 1 tablet (20 mg total) by mouth daily.   raloxifene 60 MG tablet Commonly known as:  EVISTA Take 60 mg by mouth at bedtime.   timolol 0.5 % ophthalmic solution Commonly known as:  TIMOPTIC Place 1 drop into both eyes daily.   VYTORIN 10-20 MG tablet Generic drug:  ezetimibe-simvastatin Take 1 tablet by mouth daily.      Follow-up Information    [name], MD. Go on 10/07/2016.   Specialty:  Family Medicine Contact information: [address] [place] [address] [phone]          Allergies  Allergen Reactions  . Mycostatin [Nystatin] Swelling and Rash  Consultations:  None   Procedures/Studies:  No results found.    Discharge Exam: Vitals:   10/06/16 1345 10/06/16 1410  BP: (!) 139/55 (!) 147/44  Pulse: 78 77  Resp: 16 18  Temp: 98.6 F (37 C) 98.6 F (37 C)   Vitals:   10/06/16 0450 10/06/16 0732 10/06/16 1345 10/06/16 1410  BP: (!) 127/50 (!) 143/51 (!) 139/55 (!) 147/44  Pulse: 83 79 78 77  Resp: 15 17 16 18   Temp: 98.6 F (37 C) 98.7 F (37.1 C) 98.6 F (37 C) 98.6 F (37 C)  TempSrc: Oral Oral Oral Oral  SpO2: 99% 99% 98% 97%  Weight:      Height:        General: Pt is alert, awake, not in acute distress Cardiovascular: RRR, S1/S2 +, no rubs, no gallops Respiratory: CTA bilaterally, no wheezing, no rhonchi Abdominal: Soft, NT, ND, bowel sounds + Extremities: no edema, no cyanosis    The results of significant diagnostics from this hospitalization (including imaging, microbiology, ancillary and laboratory) are listed below for reference.     Microbiology: No results found for this or any previous visit (from the past 240 hour(s)).    Labs: BNP (last 3 results) No results for input(s): BNP in the last 8760 hours. Basic Metabolic Panel:  Recent Labs Lab 10/05/16 1824  NA 135  K 4.4  CL 103  CO2 24  GLUCOSE 107*  BUN 21*  CREATININE 0.74  CALCIUM 8.8*   Liver Function Tests:  Recent Labs Lab 10/05/16 1824  AST 20  ALT 16  ALKPHOS 47  BILITOT 0.1*  PROT 6.3*  ALBUMIN 3.9   No results for input(s): LIPASE, AMYLASE in the last 168 hours. No results for input(s): AMMONIA in the last 168 hours. CBC:  Recent Labs Lab 10/05/16 1824 10/06/16 0928  WBC 6.0 5.6  NEUTROABS 4.2  --   HGB 6.1* 9.1*  HCT 20.0* 28.3*  MCV 75.8* 78.6  PLT 218 187   Cardiac Enzymes: No results for input(s): CKTOTAL, CKMB, CKMBINDEX, TROPONINI in the last 168 hours. BNP: Invalid input(s): POCBNP CBG: No results for input(s): GLUCAP in the last 168 hours. D-Dimer No results for input(s): DDIMER in the last 72 hours. Hgb A1c No results for input(s): HGBA1C in the last 72 hours. Lipid Profile No results for input(s): CHOL, HDL, LDLCALC, TRIG, CHOLHDL, LDLDIRECT in the last 72 hours. Thyroid function studies No results for input(s): TSH, T4TOTAL, T3FREE, THYROIDAB in the last 72 hours.  Invalid input(s): FREET3 Anemia work up  Recent Labs  10/05/16 2036  VITAMINB12 427  FOLATE 38.7  FERRITIN 5*  TIBC 360  IRON 13*  RETICCTPCT 1.5   Urinalysis    Component Value Date/Time   COLORURINE YELLOW 03/06/2016 1535   APPEARANCEUR CLOUDY (A) 03/06/2016 1535   LABSPEC 1.029 03/06/2016 1535   PHURINE 5.5 03/06/2016 1535   GLUCOSEU NEGATIVE 03/06/2016 1535   HGBUR NEGATIVE 03/06/2016 1535   BILIRUBINUR SMALL (A) 03/06/2016 1535   BILIRUBINUR small 11/28/2011 1136   KETONESUR NEGATIVE 03/06/2016 1535   PROTEINUR NEGATIVE 03/06/2016 1535   UROBILINOGEN 0.2 07/03/2014 1941   NITRITE NEGATIVE 03/06/2016 1535   LEUKOCYTESUR TRACE (A) 03/06/2016 1535   Sepsis Labs Invalid input(s): PROCALCITONIN,  WBC,   LACTICIDVEN Microbiology No results found for this or any previous visit (from the past 240 hour(s)).   Time coordinating discharge: 45 minutes  SIGNED:  Noralee Stain, DO [facility] Pager [phone]  If 7PM-7AM, please contact night-coverage  [email or web address] Password [email or web address] 10/06/2016, 3:56 PM

## 2016-10-07 ENCOUNTER — Telehealth: Payer: Self-pay | Admitting: Neurology

## 2016-10-07 DIAGNOSIS — I10 Essential (primary) hypertension: Secondary | ICD-10-CM | POA: Diagnosis not present

## 2016-10-07 DIAGNOSIS — R7301 Impaired fasting glucose: Secondary | ICD-10-CM | POA: Diagnosis not present

## 2016-10-07 DIAGNOSIS — D509 Iron deficiency anemia, unspecified: Secondary | ICD-10-CM | POA: Diagnosis not present

## 2016-10-07 DIAGNOSIS — E782 Mixed hyperlipidemia: Secondary | ICD-10-CM | POA: Diagnosis not present

## 2016-10-07 LAB — TYPE AND SCREEN
ABO/RH(D): A POS
Antibody Screen: NEGATIVE
Unit division: 0
Unit division: 0

## 2016-10-07 LAB — BPAM RBC
Blood Product Expiration Date: 201808142359
Blood Product Expiration Date: 201808162359
ISSUE DATE / TIME: 201808010026
ISSUE DATE / TIME: 201808010417
Unit Type and Rh: 6200
Unit Type and Rh: 6200

## 2016-10-07 LAB — HAPTOGLOBIN: Haptoglobin: 151 mg/dL (ref 34–200)

## 2016-10-07 MED ORDER — PRIMIDONE 50 MG PO TABS: 150.0000 mg | ORAL_TABLET | Freq: Every day | ORAL | 11 refills | Status: DC

## 2016-10-07 NOTE — Addendum Note (Signed)
Addended by: Donnelly AngelicaHOGAN, Vernetta Dizdarevic L on: 10/07/2016 11:02 AM   Modules accepted: Orders

## 2016-10-07 NOTE — Telephone Encounter (Signed)
Pt daughter calling re: the primidone (MYSOLINE) 50 MG tablet to  [id number] [place], [place] - [address] [phone] (Phone) [phone] (Fax)   She states she checked with the pharmacy and it is not there.  Pt daughter wants to make sure it is filled because pt will need it by the weekend, please send

## 2016-10-07 NOTE — Telephone Encounter (Signed)
Refills e-scribed as requested. 

## 2016-10-14 DIAGNOSIS — D649 Anemia, unspecified: Secondary | ICD-10-CM | POA: Diagnosis not present

## 2016-10-19 DIAGNOSIS — D509 Iron deficiency anemia, unspecified: Secondary | ICD-10-CM | POA: Diagnosis not present

## 2016-10-25 DIAGNOSIS — D509 Iron deficiency anemia, unspecified: Secondary | ICD-10-CM | POA: Diagnosis not present

## 2016-10-26 DIAGNOSIS — B351 Tinea unguium: Secondary | ICD-10-CM | POA: Diagnosis not present

## 2016-10-26 DIAGNOSIS — M79609 Pain in unspecified limb: Secondary | ICD-10-CM | POA: Diagnosis not present

## 2016-10-28 ENCOUNTER — Encounter: Payer: Self-pay | Admitting: Oncology

## 2016-10-28 ENCOUNTER — Telehealth: Payer: Self-pay | Admitting: Oncology

## 2016-10-28 NOTE — Telephone Encounter (Signed)
Appt has been scheduled for the pt to see Dr. Clelia Croft on 9/5 at 2pm. Appt given to the pt's daughter. Aware to arrive 30 minutes early. Letter faxed to the referring.

## 2016-11-10 ENCOUNTER — Telehealth: Payer: Self-pay | Admitting: Oncology

## 2016-11-10 ENCOUNTER — Ambulatory Visit (HOSPITAL_BASED_OUTPATIENT_CLINIC_OR_DEPARTMENT_OTHER): Payer: Medicare Other | Admitting: Oncology

## 2016-11-10 ENCOUNTER — Ambulatory Visit (HOSPITAL_BASED_OUTPATIENT_CLINIC_OR_DEPARTMENT_OTHER): Payer: Medicare Other

## 2016-11-10 VITALS — BP 138/52 | HR 100 | Temp 99.4°F | Resp 18 | Ht 61.5 in | Wt 132.3 lb

## 2016-11-10 DIAGNOSIS — D509 Iron deficiency anemia, unspecified: Secondary | ICD-10-CM

## 2016-11-10 LAB — CBC WITH DIFFERENTIAL/PLATELET
BASO%: 0.4 % (ref 0.0–2.0)
Basophils Absolute: 0 10*3/uL (ref 0.0–0.1)
EOS%: 1.2 % (ref 0.0–7.0)
Eosinophils Absolute: 0.1 10*3/uL (ref 0.0–0.5)
HCT: 28.9 % — ABNORMAL LOW (ref 34.8–46.6)
HGB: 9.1 g/dL — ABNORMAL LOW (ref 11.6–15.9)
LYMPH%: 15.8 % (ref 14.0–49.7)
MCH: 28.6 pg (ref 25.1–34.0)
MCHC: 31.5 g/dL (ref 31.5–36.0)
MCV: 90.9 fL (ref 79.5–101.0)
MONO#: 0.7 10*3/uL (ref 0.1–0.9)
MONO%: 13.6 % (ref 0.0–14.0)
NEUT#: 3.4 10*3/uL (ref 1.5–6.5)
NEUT%: 69 % (ref 38.4–76.8)
Platelets: 211 10*3/uL (ref 145–400)
RBC: 3.18 10*6/uL — ABNORMAL LOW (ref 3.70–5.45)
WBC: 5 10*3/uL (ref 3.9–10.3)
lymph#: 0.8 10*3/uL — ABNORMAL LOW (ref 0.9–3.3)

## 2016-11-10 NOTE — Telephone Encounter (Signed)
Gave patient avs and calendar with upcoming appts.  °

## 2016-11-10 NOTE — Progress Notes (Signed)
Reason for Referral: Iron deficiency anemia.   HPI: 83 year old woman in reasonably good health and shape and currently resides in a senior living facility in the area. She has history of arthritis and glaucoma as well as hyperlipidemia. She was diagnosed with iron deficiency in the past and she declined a colonoscopy but did have an endoscopy at the time. She did develop aspiration pneumonia following that per her report. She was hospitalized again on 10/05/2016 after a routine CBC showed a hemoglobin of 6.1 but her primary care physicians. Repeat CBC on admission showed a hemoglobin of 6.1, white cell count of 6.0 and platelet count of 218. Her MCV was 75 with an elevated RDW. Iron studies at the time showed a iron levels to be 13 with ferritin of less than 5. Her saturations was at 4%. Her last iron studies in 2017 were normal but in April 2016 her iron was 32 with saturation of 16%. She denied any hematochezia, melena or discoloration the color of her stool. She did have stool occult testing that was negative. She received packed red cell transfusion as well as IV iron infusion and was discharged on 10/06/2016 with a hemoglobin of 9.1. Repeat CBC outpatient basis showed a hemoglobin of 9.7 and ferritin close to 300. Clinically, she reports no complaints at this time. She did not feel badly we even with a hemoglobin of 6. She denied any dyspnea on exertion or excessive fatigue. She still ambulates with the help of a walker and does not drive. She denied any hemoptysis or hematemesis.  She does not report any headaches, blurry vision, syncope or seizures. She does not report any fevers, chills or sweats. She does not report any weight loss or appetite changes. She does not report any abdominal pain, as satiety or change in her bowel habits. She does not report any cough, wheezing or hemoptysis. She does not report any orthopnea, chest pain or leg edema. She does not report any frequency urgency or hesitancy.  Remaining review of systems unremarkable.   Past Medical History:  Diagnosis Date  . Arthritis   . Carotid artery occlusion   . Cataract   . Glaucoma   . High cholesterol   . Hypertension   . Osteoporosis   . Pneumonia July 03, 2014  . Syncope 03/15/2016  . UTI (urinary tract infection)   :  Past Surgical History:  Procedure Laterality Date  . ABDOMINAL HYSTERECTOMY  1969  . APPENDECTOMY    . EYE SURGERY    :   Current Outpatient Prescriptions:  .  acetaminophen (TYLENOL) 325 MG tablet, Take 2 tablets (650 mg total) by mouth every 6 (six) hours as needed for mild pain (or Fever >/= 101)., Disp: 30 tablet, Rfl: 0 .  aspirin EC 81 MG tablet, Take 81 mg by mouth daily., Disp: , Rfl:  .  calcium carbonate (OS-CAL) 600 MG TABS, Take 600 mg by mouth 2 (two) times daily with a meal., Disp: , Rfl:  .  ferrous sulfate 325 (65 FE) MG tablet, Take 1 tablet (325 mg total) by mouth 2 (two) times daily with a meal., Disp: 60 tablet, Rfl: 0 .  loratadine (CLARITIN) 10 MG tablet, Take 10 mg by mouth daily., Disp: , Rfl:  .  Menthol, Topical Analgesic, (BIOFREEZE ROLL-ON) 4 % GEL, Apply topically. Apply to right knee as needed, Disp: , Rfl:  .  Multiple Vitamins-Minerals (MULTIVITAMIN WITH MINERALS) tablet, Take 1 tablet by mouth daily., Disp: , Rfl:  .  pramipexole (MIRAPEX) 0.125 MG tablet, Take 0.125 mg by mouth. Take one tablet at bedtime for restless leg syndrome, Disp: , Rfl:  .  primidone (MYSOLINE) 50 MG tablet, Take 3 tablets (150 mg total) by mouth at bedtime., Disp: 90 tablet, Rfl: 11 .  raloxifene (EVISTA) 60 MG tablet, Take 60 mg by mouth at bedtime. , Disp: , Rfl:  .  timolol (TIMOPTIC) 0.5 % ophthalmic solution, Place 1 drop into both eyes daily. , Disp: , Rfl: 1 .  VYTORIN 10-20 MG tablet, Take 1 tablet by mouth daily., Disp: , Rfl: 0:  Allergies  Allergen Reactions  . Mycostatin [Nystatin] Swelling and Rash  :  Family History  Problem Relation Age of Onset  . Hypertension  Mother   . Heart disease Mother   . Hyperlipidemia Brother   . Hypertension Brother   :  Social History   Social History  . Marital status: Widowed    Spouse name: N/A  . Number of children: 3  . Years of education: 75   Occupational History  . retired Child psychotherapist    Social History Main Topics  . Smoking status: Never Smoker  . Smokeless tobacco: Never Used  . Alcohol use No     Comment: rare  . Drug use: No  . Sexual activity: No   Other Topics Concern  . Not on file   Social History Narrative   Admitted to [facility] skill unit 03/10/16   Widowed   Never smoked   Alcohol none   Living Will, POA  :  Pertinent items are noted in HPI.  Exam: Blood pressure (!) 138/52, pulse 100, temperature 99.4 F (37.4 C), temperature source Oral, resp. rate 18, height 5' 1.5" (1.562 m), weight 132 lb 4.8 oz (60 kg), SpO2 100 %.  ECOG 1  General appearance: alert and cooperative appeared without distress. Throat: No oral thrush. Neck: no adenopathy Back: negative Resp: No rhonchi, wheezes or dullness to percussion. Clear to auscultation. Cardio: regular rate and rhythm, S1, S2 normal, no murmur, click, rub or gallop GI: soft, non-tender; bowel sounds normal; no masses,  no organomegaly Extremities: extremities normal, atraumatic, no cyanosis or edema Pulses: 2+ and symmetric Skin: Skin color, texture, turgor normal. No rashes or lesions  CBC    Component Value Date/Time   WBC 5.6 10/06/2016 0928   RBC 3.60 (L) 10/06/2016 0928   HGB 9.1 (L) 10/06/2016 0928   HCT 28.3 (L) 10/06/2016 0928   HCT 27.8 (L) 07/06/2014 1320   PLT 187 10/06/2016 0928   MCV 78.6 10/06/2016 0928   MCV 103.0 (A) 01/31/2013 1332   MCH 25.3 (L) 10/06/2016 0928   MCHC 32.2 10/06/2016 0928   RDW 16.9 (H) 10/06/2016 0928   LYMPHSABS 1.0 10/05/2016 1824   MONOABS 0.8 10/05/2016 1824   EOSABS 0.1 10/05/2016 1824   BASOSABS 0.0 10/05/2016 1824     Chemistry      Component Value  Date/Time   NA 135 10/05/2016 1824   NA 135 (A) 04/01/2016   K 4.4 10/05/2016 1824   CL 103 10/05/2016 1824   CO2 24 10/05/2016 1824   BUN 21 (H) 10/05/2016 1824   BUN 19 04/01/2016   CREATININE 0.74 10/05/2016 1824   GLU 102 04/01/2016      Component Value Date/Time   CALCIUM 8.8 (L) 10/05/2016 1824   ALKPHOS 47 10/05/2016 1824   AST 20 10/05/2016 1824   ALT 16 10/05/2016 1824   BILITOT 0.1 (L) 10/05/2016  1824     Results for AAMORI, MCMASTERS (MRN [id number]) as of 11/10/2016 14:29  Ref. Range 10/05/2016 20:36  Iron Latest Ref Range: 28 - 170 ug/dL 13 (L)  UIBC Latest Units: ug/dL 409  TIBC Latest Ref Range: 250 - 450 ug/dL 811  Saturation Ratios Latest Ref Range: 10.4 - 31.8 % 4 (L)  Ferritin Latest Ref Range: 11 - 307 ng/mL 5 (L)  Folate Latest Ref Range: >5.9 ng/mL 38.7    Assessment and Plan:   83 year old woman with the following issues:  1. Iron deficiency anemia diagnosed at least since 2016. In July 2018 she presented with a hemoglobin of 6.1, iron of 13 and ferritin of 5. She received packed red cell transfusion and IV iron. She is currently on sulfate twice a day.  The etiology of her iron deficiency is unclear. She has no clear-cut GI losses including no abnormalities on an endoscopy and fecal occult testing. She declined a colonoscopy. Possibility of a iron losing enteropathy as well as poor absorption issues could explain her iron deficiency. She has no clear-cut blood losses that is visible.  Occult malignancy such as colon cancer or intra-abdominal malignancy is a possibility but considered less likely. Given her advanced age she has declined any aggressive workup and have elected to proceed with conservative management.  From a management standpoint, I recommended continuing oral iron once a day and we will check her iron stores periodically. IV iron replacement may be needed in the future to reported further decline in her hemoglobin and possible  hospitalization.  2. Follow-up: Will be in the next 2-3 months to recheck your iron studies.

## 2016-11-11 LAB — IRON AND TIBC
%SAT: 21 % (ref 21–57)
Iron: 48 ug/dL (ref 41–142)
TIBC: 230 ug/dL — ABNORMAL LOW (ref 236–444)
UIBC: 182 ug/dL (ref 120–384)

## 2016-11-11 LAB — FERRITIN: Ferritin: 59 ng/ml (ref 9–269)

## 2016-11-16 DIAGNOSIS — H6121 Impacted cerumen, right ear: Secondary | ICD-10-CM | POA: Diagnosis not present

## 2016-11-29 ENCOUNTER — Telehealth: Payer: Self-pay | Admitting: *Deleted

## 2016-11-29 NOTE — Telephone Encounter (Signed)
Spoke with patient's daughter, Britta Mccreedy. Per dr Clelia Croft, Let her know iron is good. Keep taking oral iron at home. Mailed copy of labs to patient's home.

## 2016-11-29 NOTE — Telephone Encounter (Signed)
-----   Message from Benjiman Core, MD sent at 11/11/2016 10:27 AM EDT ----- Please let her daughter know her hgb is stable and iron is normal. She is to continue iron pills once a day.

## 2016-12-10 DIAGNOSIS — Z23 Encounter for immunization: Secondary | ICD-10-CM | POA: Diagnosis not present

## 2016-12-10 DIAGNOSIS — D509 Iron deficiency anemia, unspecified: Secondary | ICD-10-CM | POA: Diagnosis not present

## 2016-12-17 ENCOUNTER — Telehealth: Payer: Self-pay

## 2016-12-17 ENCOUNTER — Encounter: Payer: Self-pay | Admitting: *Deleted

## 2016-12-17 NOTE — Telephone Encounter (Signed)
S/w Barb with Dr Clelia Croft message.

## 2016-12-17 NOTE — Telephone Encounter (Signed)
Nothing to be done at this time.

## 2016-12-17 NOTE — Telephone Encounter (Signed)
Saw Dr Clelia Croft Sept 5th, next OV is mid nov. PCP on Friday hgb and ferritin checked. hgb 10, ferritin 22. What needs to be done? Her PCP is Gardiner Ramus. Labs should have been faxed yesterday.  Lesle Reek will be away from phone [phone].

## 2017-01-04 DIAGNOSIS — M79675 Pain in left toe(s): Secondary | ICD-10-CM | POA: Diagnosis not present

## 2017-01-04 DIAGNOSIS — B351 Tinea unguium: Secondary | ICD-10-CM | POA: Diagnosis not present

## 2017-01-04 DIAGNOSIS — M79674 Pain in right toe(s): Secondary | ICD-10-CM | POA: Diagnosis not present

## 2017-01-20 ENCOUNTER — Other Ambulatory Visit (HOSPITAL_BASED_OUTPATIENT_CLINIC_OR_DEPARTMENT_OTHER): Payer: Medicare Other

## 2017-01-20 ENCOUNTER — Ambulatory Visit: Payer: Medicare Other | Admitting: Oncology

## 2017-01-20 ENCOUNTER — Telehealth: Payer: Self-pay | Admitting: Oncology

## 2017-01-20 DIAGNOSIS — D509 Iron deficiency anemia, unspecified: Secondary | ICD-10-CM

## 2017-01-20 LAB — CBC WITH DIFFERENTIAL/PLATELET
BASO%: 0.2 % (ref 0.0–2.0)
Basophils Absolute: 0 10*3/uL (ref 0.0–0.1)
EOS%: 0.9 % (ref 0.0–7.0)
Eosinophils Absolute: 0.1 10*3/uL (ref 0.0–0.5)
HCT: 26.1 % — ABNORMAL LOW (ref 34.8–46.6)
HGB: 8.3 g/dL — ABNORMAL LOW (ref 11.6–15.9)
LYMPH%: 17.2 % (ref 14.0–49.7)
MCH: 30.6 pg (ref 25.1–34.0)
MCHC: 31.8 g/dL (ref 31.5–36.0)
MCV: 96.3 fL (ref 79.5–101.0)
MONO#: 0.8 10*3/uL (ref 0.1–0.9)
MONO%: 14.5 % — ABNORMAL HIGH (ref 0.0–14.0)
NEUT#: 3.6 10*3/uL (ref 1.5–6.5)
NEUT%: 67.2 % (ref 38.4–76.8)
Platelets: 186 10*3/uL (ref 145–400)
RBC: 2.71 10*6/uL — ABNORMAL LOW (ref 3.70–5.45)
RDW: 13.4 % (ref 11.2–14.5)
WBC: 5.3 10*3/uL (ref 3.9–10.3)
lymph#: 0.9 10*3/uL (ref 0.9–3.3)

## 2017-01-20 NOTE — Telephone Encounter (Signed)
Gave avs and calendar for December  °

## 2017-01-20 NOTE — Progress Notes (Signed)
Hematology and Oncology Follow Up Visit  Lisa [name] [id number] [birth year] 83 y.o. 01/20/2017 3:02 PM [name], [name], MDBarnes, [name], MD   Principle Diagnosis: 83 year old woman with iron deficiency anemia diagnosed in July 2018.  She presented with hemoglobin of 6.1, iron 13 and ferritin of 5.  She received packed red cell transfusion and IV iron at that time.   Prior Therapy: Packed red cell transfusion  Current therapy: Supportive care with packed red cell transfusion and IV iron as needed.  Interim History: [name] presents today for a follow-up visit.  The last visit, she reports no changes in her health.  She denied any hematochezia, melena or excessive fatigue or tiredness.  She remains in reasonable health without any decline in her ability to attend to activities of daily living.  He denies any hospitalizations or illness  She does not report any headaches, blurry vision, syncope or seizures. She does not report any fevers, chills or sweats. She does not report any weight loss or appetite changes. She does not report any abdominal pain, as satiety or change in her bowel habits. She does not report any cough, wheezing or hemoptysis. She does not report any orthopnea, chest pain or leg edema. She does not report any frequency urgency or hesitancy. Remaining review of systems unremarkable.    Medications: I have reviewed the patient's current medications.  Current Outpatient Medications  Medication Sig Dispense Refill  . acetaminophen (TYLENOL) 325 MG tablet Take 2 tablets (650 mg total) by mouth every 6 (six) hours as needed for mild pain (or Fever >/= 101). 30 tablet 0  . aspirin EC 81 MG tablet Take 81 mg by mouth daily.    . calcium carbonate (OS-CAL) 600 MG TABS Take 600 mg by mouth 2 (two) times daily with a meal.    . ferrous sulfate 325 (65 FE) MG tablet Take 1 tablet (325 mg total) by mouth 2 (two) times daily with a meal. 60 tablet 0  . loratadine (CLARITIN)  10 MG tablet Take 10 mg by mouth daily.    . Menthol, Topical Analgesic, (BIOFREEZE ROLL-ON) 4 % GEL Apply topically. Apply to right knee as needed    . Multiple Vitamins-Minerals (MULTIVITAMIN WITH MINERALS) tablet Take 1 tablet by mouth daily.    . pramipexole (MIRAPEX) 0.125 MG tablet Take 0.125 mg by mouth. Take one tablet at bedtime for restless leg syndrome    . primidone (MYSOLINE) 50 MG tablet Take 3 tablets (150 mg total) by mouth at bedtime. 90 tablet 11  . raloxifene (EVISTA) 60 MG tablet Take 60 mg by mouth at bedtime.     . timolol (TIMOPTIC) 0.5 % ophthalmic solution Place 1 drop into both eyes daily.   1  . VYTORIN 10-20 MG tablet Take 1 tablet by mouth daily.  0   No current facility-administered medications for this visit.      Allergies:  Allergies  Allergen Reactions  . Mycostatin [Nystatin] Swelling and Rash    Past Medical History, Surgical history, Social history, and Family History were reviewed and updated.  Physical Exam: Blood pressure (!) 150/89, pulse 69, temperature 98.4 F (36.9 C), temperature source Oral, resp. rate 16, height 5' 1.5" (1.562 m), weight 136 lb 6.4 oz (61.9 kg), SpO2 98 %. ECOG: 1 General appearance: alert and cooperative appeared without distress. Head: Normocephalic, without obvious abnormality without oral ulcers or lesions. Neck: no adenopathy Lymph nodes: Cervical, supraclavicular, and axillary nodes normal. Heart:regular rate and rhythm, S1, S2 normal,  no murmur, click, rub or gallop Lung:chest clear, no wheezing, rales, normal symmetric air entry. Abdomin: soft, non-tender, without masses or organomegaly EXT:no erythema, induration, or nodules   Lab Results: Lab Results  Component Value Date   WBC 5.3 01/20/2017   HGB 8.3 (L) 01/20/2017   HCT 26.1 (L) 01/20/2017   MCV 96.3 01/20/2017   PLT 186 01/20/2017     Chemistry      Component Value Date/Time   NA 135 10/05/2016 1824   NA 135 (A) 04/01/2016   K 4.4 10/05/2016  1824   CL 103 10/05/2016 1824   CO2 24 10/05/2016 1824   BUN 21 (H) 10/05/2016 1824   BUN 19 04/01/2016   CREATININE 0.74 10/05/2016 1824   GLU 102 04/01/2016      Component Value Date/Time   CALCIUM 8.8 (L) 10/05/2016 1824   ALKPHOS 47 10/05/2016 1824   AST 20 10/05/2016 1824   ALT 16 10/05/2016 1824   BILITOT 0.1 (L) 10/05/2016 1824       Impression and Plan:  83 year old woman with the following issues:  1. Iron deficiency anemia diagnosed at least since 2016. In July 2018 she presented with a hemoglobin of 6.1, iron of 13 and ferritin of 5. She received packed red cell transfusion and IV iron. She is currently on oral iron replacement.  Her hemoglobin dropped again with declining iron stores.  Risks and benefits of intravenous iron was reviewed today and she is agreeable to proceed.  Complications such as arthralgias, myalgia and rarely anaphylaxis were discussed.  If replacing her iron does not improve her hemoglobin, growth factor support may be needed.  2.  Follow-up: Will be in the next 6 weeks to follow her progress.       Eli HoseFiras Aaliayah Miao, MD 11/15/20183:02 PM

## 2017-01-21 ENCOUNTER — Telehealth: Payer: Self-pay | Admitting: *Deleted

## 2017-01-21 LAB — IRON AND TIBC
%SAT: 48 % (ref 21–57)
Iron: 125 ug/dL (ref 41–142)
TIBC: 258 ug/dL (ref 236–444)
UIBC: 133 ug/dL (ref 120–384)

## 2017-01-21 LAB — FERRITIN: Ferritin: 15 ng/ml (ref 9–269)

## 2017-01-21 NOTE — Telephone Encounter (Signed)
-----   Message from Benjiman CoreFiras N [name], MD sent at 01/21/2017 12:37 PM EST ----- Please let her daughter know her Ferritin is 15 and will need IV iron.

## 2017-01-21 NOTE — Telephone Encounter (Signed)
As noted below by Dr. Clelia CroftShadad, I informed patient's daughter that her mom needs to get IV iron. She verbalized understanding.

## 2017-01-25 ENCOUNTER — Ambulatory Visit (HOSPITAL_COMMUNITY)
Admission: RE | Admit: 2017-01-25 | Discharge: 2017-01-25 | Disposition: A | Discharge status: Home / Self Care | Payer: Medicare Other | Source: Ambulatory Visit | Attending: Oncology | Admitting: Oncology

## 2017-01-25 ENCOUNTER — Other Ambulatory Visit: Payer: Self-pay | Admitting: Oncology

## 2017-01-25 ENCOUNTER — Encounter: Payer: Self-pay | Admitting: *Deleted

## 2017-01-25 VITALS — BP 134/43 | HR 87 | Temp 98.1°F | Resp 16

## 2017-01-25 DIAGNOSIS — D649 Anemia, unspecified: Secondary | ICD-10-CM

## 2017-01-25 DIAGNOSIS — D509 Iron deficiency anemia, unspecified: Secondary | ICD-10-CM | POA: Diagnosis present

## 2017-01-25 MED ORDER — SODIUM CHLORIDE 0.9 % IV SOLN
510.0000 mg | Freq: Once | INTRAVENOUS | Status: AC
  Administered 2017-01-25: 510 mg via INTRAVENOUS
  Filled 2017-01-25: qty 17

## 2017-01-25 NOTE — Progress Notes (Signed)
PATIENT CARE CENTER NOTE  Diagnosis: Iron deficiency anemia    Provider: Dr. Clelia CroftShadad  Procedure: IV Feraheme infusion    Note: Patient received an infusion of 510 mg bag of IV Feraheme. Patient tolerated infusion well. Vitals stable. Discharge instructions given to patient and patient's daughter. Patient and daughter express an understanding of instructions. Patient alert, oriented and ambulatory at discharge.

## 2017-01-25 NOTE — Discharge Instructions (Signed)

## 2017-02-01 ENCOUNTER — Ambulatory Visit (HOSPITAL_COMMUNITY)
Admission: RE | Admit: 2017-02-01 | Discharge: 2017-02-01 | Disposition: A | Discharge status: Home / Self Care | Payer: Medicare Other | Source: Ambulatory Visit | Attending: Oncology | Admitting: Oncology

## 2017-02-01 DIAGNOSIS — D649 Anemia, unspecified: Secondary | ICD-10-CM

## 2017-02-01 DIAGNOSIS — D509 Iron deficiency anemia, unspecified: Secondary | ICD-10-CM | POA: Diagnosis not present

## 2017-02-01 MED ORDER — SODIUM CHLORIDE 0.9 % IV SOLN
510.0000 mg | Freq: Once | INTRAVENOUS | Status: AC
  Administered 2017-02-01: 510 mg via INTRAVENOUS
  Filled 2017-02-01: qty 17

## 2017-02-01 NOTE — Discharge Instructions (Signed)

## 2017-02-01 NOTE — Progress Notes (Signed)
PATIENT CARE CENTER NOTE  Diagnosis: Iron deficiency anemia    Provider: Dr. [name]  Procedure: IV Feraheme infusion    Note: Patient received an infusion of 510 mg bag of IV Feraheme. Patient tolerated infusion well. Vitals stable. Discharge instructions given to patient and patient's daughter. Patient and daughter express an understanding of instructions. Patient alert, oriented and ambulatory at discharge.   

## 2017-02-04 DIAGNOSIS — Z961 Presence of intraocular lens: Secondary | ICD-10-CM | POA: Diagnosis not present

## 2017-02-04 DIAGNOSIS — H401131 Primary open-angle glaucoma, bilateral, mild stage: Secondary | ICD-10-CM | POA: Diagnosis not present

## 2017-02-11 ENCOUNTER — Ambulatory Visit: Payer: Medicare Other | Admitting: Family

## 2017-02-22 DIAGNOSIS — M79675 Pain in left toe(s): Secondary | ICD-10-CM | POA: Diagnosis not present

## 2017-02-22 DIAGNOSIS — B351 Tinea unguium: Secondary | ICD-10-CM | POA: Diagnosis not present

## 2017-02-22 DIAGNOSIS — M79674 Pain in right toe(s): Secondary | ICD-10-CM | POA: Diagnosis not present

## 2017-03-03 ENCOUNTER — Telehealth: Payer: Self-pay | Admitting: Oncology

## 2017-03-03 ENCOUNTER — Ambulatory Visit (HOSPITAL_BASED_OUTPATIENT_CLINIC_OR_DEPARTMENT_OTHER): Payer: Medicare Other | Admitting: Oncology

## 2017-03-03 ENCOUNTER — Other Ambulatory Visit (HOSPITAL_BASED_OUTPATIENT_CLINIC_OR_DEPARTMENT_OTHER): Payer: Medicare Other

## 2017-03-03 VITALS — BP 144/53 | HR 101 | Temp 98.9°F | Resp 18 | Ht 61.5 in | Wt 133.9 lb

## 2017-03-03 DIAGNOSIS — D509 Iron deficiency anemia, unspecified: Secondary | ICD-10-CM | POA: Diagnosis not present

## 2017-03-03 DIAGNOSIS — D649 Anemia, unspecified: Secondary | ICD-10-CM

## 2017-03-03 LAB — CBC WITH DIFFERENTIAL/PLATELET
BASO%: 0.4 % (ref 0.0–2.0)
Basophils Absolute: 0 10*3/uL (ref 0.0–0.1)
EOS%: 0.7 % (ref 0.0–7.0)
Eosinophils Absolute: 0 10*3/uL (ref 0.0–0.5)
HCT: 27.4 % — ABNORMAL LOW (ref 34.8–46.6)
HGB: 9 g/dL — ABNORMAL LOW (ref 11.6–15.9)
LYMPH%: 9.6 % — ABNORMAL LOW (ref 14.0–49.7)
MCH: 32.5 pg (ref 25.1–34.0)
MCHC: 32.7 g/dL (ref 31.5–36.0)
MCV: 99.5 fL (ref 79.5–101.0)
MONO#: 0.5 10*3/uL (ref 0.1–0.9)
MONO%: 9.8 % (ref 0.0–14.0)
NEUT#: 4.2 10*3/uL (ref 1.5–6.5)
NEUT%: 79.5 % — ABNORMAL HIGH (ref 38.4–76.8)
Platelets: 258 10*3/uL (ref 145–400)
RBC: 2.76 10*6/uL — ABNORMAL LOW (ref 3.70–5.45)
RDW: 17.8 % — ABNORMAL HIGH (ref 11.2–14.5)
WBC: 5.3 10*3/uL (ref 3.9–10.3)
lymph#: 0.5 10*3/uL — ABNORMAL LOW (ref 0.9–3.3)

## 2017-03-03 NOTE — Telephone Encounter (Signed)
Gave avs and calendar for February 2019 °

## 2017-03-03 NOTE — Progress Notes (Signed)
Hematology and Oncology Follow Up Visit  Lisa [name] [id number] March 24, 1923 83 y.o. 03/03/2017 3:49 PM Leighton Ruff, MDBarnes, [name], MD   Principle Diagnosis: 83 year old woman with iron deficiency anemia diagnosed in July 2018.  She presented with hemoglobin of 6.1, iron 13 and ferritin of 5.  She received packed red cell transfusion and IV iron at that time.   Prior Therapy: IV iron infusion completed in November 2018.  She is also status post post packed red cell transfusion  Current therapy: Supportive care with packed red cell transfusion and IV iron as needed.  Interim History: Lisa [name] presents today for a follow-up visit.  The last visit, she reports no complaints.  She received intravenous iron without any issues.  She denied any infusion related complications.  She denied any hematochezia, melena or excessive fatigue or tiredness.  She remains in reasonable health without any decline in her ability to attend to activities of daily living.  No recent hospitalizations.  She does not report any headaches, blurry vision, syncope or seizures. She does not report any fevers, chills or sweats. She does not report any weight loss or appetite changes. She does not report any abdominal pain, as satiety or change in her bowel habits. She does not report any cough, wheezing or hemoptysis. She does not report any orthopnea, chest pain or leg edema. She does not report any frequency urgency or hesitancy. Remaining review of systems unremarkable.    Medications: I have reviewed the patient's current medications.  Current Outpatient Medications  Medication Sig Dispense Refill  . acetaminophen (TYLENOL) 325 MG tablet Take 2 tablets (650 mg total) by mouth every 6 (six) hours as needed for mild pain (or Fever >/= 101). 30 tablet 0  . aspirin EC 81 MG tablet Take 81 mg by mouth daily.    . calcium carbonate (OS-CAL) 600 MG TABS Take 600 mg by mouth 2 (two) times daily with a meal.    .  ferrous sulfate 325 (65 FE) MG tablet Take 1 tablet (325 mg total) by mouth 2 (two) times daily with a meal. 60 tablet 0  . loratadine (CLARITIN) 10 MG tablet Take 10 mg by mouth daily.    . Menthol, Topical Analgesic, (BIOFREEZE ROLL-ON) 4 % GEL Apply topically. Apply to right knee as needed    . Multiple Vitamins-Minerals (MULTIVITAMIN WITH MINERALS) tablet Take 1 tablet by mouth daily.    . pramipexole (MIRAPEX) 0.125 MG tablet Take 0.125 mg by mouth. Take one tablet at bedtime for restless leg syndrome    . primidone (MYSOLINE) 50 MG tablet Take 3 tablets (150 mg total) by mouth at bedtime. 90 tablet 11  . raloxifene (EVISTA) 60 MG tablet Take 60 mg by mouth at bedtime.     . timolol (TIMOPTIC) 0.5 % ophthalmic solution Place 1 drop into both eyes daily.   1  . VYTORIN 10-20 MG tablet Take 1 tablet by mouth daily.  0   No current facility-administered medications for this visit.      Allergies:  Allergies  Allergen Reactions  . Mycostatin [Nystatin] Swelling and Rash    Past Medical History, Surgical history, Social history, and Family History were reviewed and updated.  Physical Exam: Blood pressure (!) 144/53, pulse (!) 101, temperature 98.9 F (37.2 C), temperature source Oral, resp. rate 18, height 5' 1.5" (1.562 m), weight 133 lb 14.4 oz (60.7 kg), SpO2 96 %. ECOG: 1 General appearance: Well-appearing woman without distress. Head: Normocephalic, without obvious abnormality no  oral thrush or ulcers. Neck: no adenopathy Lymph nodes: Cervical, supraclavicular, and axillary nodes normal. Heart:regular rate and rhythm, S1, S2 normal, no murmur, click, rub or gallop Lung:chest clear, no wheezing, rales, normal symmetric air entry. Abdomin: soft, non-tender, without masses or organomegaly or masses. EXT:no erythema, induration, or nodules   Lab Results: Lab Results  Component Value Date   WBC 5.3 03/03/2017   HGB 9.0 (L) 03/03/2017   HCT 27.4 (L) 03/03/2017   MCV 99.5  03/03/2017   PLT 258 03/03/2017     Chemistry      Component Value Date/Time   NA 135 10/05/2016 1824   NA 135 (A) 04/01/2016   K 4.4 10/05/2016 1824   CL 103 10/05/2016 1824   CO2 24 10/05/2016 1824   BUN 21 (H) 10/05/2016 1824   BUN 19 04/01/2016   CREATININE 0.74 10/05/2016 1824   GLU 102 04/01/2016      Component Value Date/Time   CALCIUM 8.8 (L) 10/05/2016 1824   ALKPHOS 47 10/05/2016 1824   AST 20 10/05/2016 1824   ALT 16 10/05/2016 1824   BILITOT 0.1 (L) 10/05/2016 1824       Impression and Plan:  83 year old woman with the following issues:  1. Iron deficiency anemia diagnosed at least since 2016. In July 2018 she presented with a hemoglobin of 6.1, iron of 13 and ferritin of 5. She received packed red cell transfusion and IV iron. She is currently on oral iron replacement.  She completed intravenous iron in November 2018 without any complications.  Hemoglobin today is 9.0 without any symptoms.  And is to continue with active surveillance and replace iron as needed.  Packed red cell transfusion can also be used in the future.  Bone marrow biopsy may be needed if her anemia persists despite iron replacement.  2.  Follow-up: Will be in the next 7 weeks to follow her progress.       Zola Button, MD 12/27/20183:49 PM

## 2017-03-04 LAB — FERRITIN: Ferritin: 98 ng/ml (ref 9–269)

## 2017-04-21 DIAGNOSIS — M79674 Pain in right toe(s): Secondary | ICD-10-CM | POA: Diagnosis not present

## 2017-04-21 DIAGNOSIS — M79675 Pain in left toe(s): Secondary | ICD-10-CM | POA: Diagnosis not present

## 2017-04-21 DIAGNOSIS — B351 Tinea unguium: Secondary | ICD-10-CM | POA: Diagnosis not present

## 2017-05-05 ENCOUNTER — Inpatient Hospital Stay: Payer: Medicare Other

## 2017-05-05 ENCOUNTER — Inpatient Hospital Stay: Payer: Medicare Other | Attending: Oncology | Admitting: Oncology

## 2017-05-05 VITALS — BP 137/63 | HR 106 | Temp 98.9°F | Resp 18 | Ht 61.5 in | Wt 136.3 lb

## 2017-05-05 DIAGNOSIS — D509 Iron deficiency anemia, unspecified: Secondary | ICD-10-CM | POA: Diagnosis not present

## 2017-05-05 DIAGNOSIS — D649 Anemia, unspecified: Secondary | ICD-10-CM

## 2017-05-05 DIAGNOSIS — Z7982 Long term (current) use of aspirin: Secondary | ICD-10-CM | POA: Diagnosis not present

## 2017-05-05 DIAGNOSIS — Z79899 Other long term (current) drug therapy: Secondary | ICD-10-CM | POA: Insufficient documentation

## 2017-05-05 LAB — CBC WITH DIFFERENTIAL/PLATELET
Basophils Absolute: 0 10*3/uL (ref 0.0–0.1)
Basophils Relative: 0 %
Eosinophils Absolute: 0.1 10*3/uL (ref 0.0–0.5)
Eosinophils Relative: 2 %
HCT: 22.8 % — ABNORMAL LOW (ref 34.8–46.6)
Hemoglobin: 7 g/dL — CL (ref 11.6–15.9)
Lymphocytes Relative: 15 %
Lymphs Abs: 0.8 10*3/uL — ABNORMAL LOW (ref 0.9–3.3)
MCH: 29.5 pg (ref 25.1–34.0)
MCHC: 30.7 g/dL — ABNORMAL LOW (ref 31.5–36.0)
MCV: 96.2 fL (ref 79.5–101.0)
Monocytes Absolute: 0.8 10*3/uL (ref 0.1–0.9)
Monocytes Relative: 14 %
Neutro Abs: 3.6 10*3/uL (ref 1.5–6.5)
Neutrophils Relative %: 69 %
Platelets: 201 10*3/uL (ref 145–400)
RBC: 2.37 MIL/uL — ABNORMAL LOW (ref 3.70–5.45)
RDW: 13.3 % (ref 11.2–14.5)
WBC: 5.3 10*3/uL (ref 3.9–10.3)

## 2017-05-05 LAB — SAMPLE TO BLOOD BANK

## 2017-05-05 NOTE — Progress Notes (Signed)
Hematology and Oncology Follow Up Visit  AVERLY [name] [id number] 30-Jan-1924 84 y.o. 05/05/2017 4:04 PM Leighton Ruff, MDBarnes, Lisa Mola, MD   Principle Diagnosis: 84 year old woman with multifactorial anemia.  She has an element of iron deficiency anemia as well as chronic disease anemia diagnosed in July 2018.  Prior Therapy: IV iron infusion completed in November 2018.  She is also status post post packed red cell transfusion  Current therapy: Supportive care with transfusion and repeat IV iron.  Interim History: Lisa [name] is here for a follow-up.  Since the last visit, she reports no changes in her health.  She denies any hematochezia, melena or change in her performance status.  She ambulates with the help of a walker without any falls or syncope.  She denies any hospitalization or illnesses.  She denied any excessive fatigue or tiredness.  She denies any exertional dyspnea or decline in her performance status.  Her appetite remained reasonable without any change.  She does not report any headaches, blurry vision, syncope or seizures. She does not report any fevers, chills or sweats.  She does not report any chest pain, palpitation orthopnea.  She does not report any abdominal pain, as satiety or change in her bowel habits. She does not report any cough, wheezing or hemoptysis. She does not report any frequency urgency or hesitancy.  She does not report any skeletal complaints of arthralgias or myalgias.  She does not report lymphadenopathy or petechiae.  She does not report any skin rashes or lesions.  Remaining review of systems is negative.   Medications: I have reviewed the patient's current medications.  Current Outpatient Medications  Medication Sig Dispense Refill  . acetaminophen (TYLENOL) 325 MG tablet Take 2 tablets (650 mg total) by mouth every 6 (six) hours as needed for mild pain (or Fever >/= 101). 30 tablet 0  . aspirin EC 81 MG tablet Take 81 mg by mouth daily.     . calcium carbonate (OS-CAL) 600 MG TABS Take 600 mg by mouth 2 (two) times daily with a meal.    . ferrous sulfate 325 (65 FE) MG tablet Take 1 tablet (325 mg total) by mouth 2 (two) times daily with a meal. 60 tablet 0  . loratadine (CLARITIN) 10 MG tablet Take 10 mg by mouth daily.    . Menthol, Topical Analgesic, (BIOFREEZE ROLL-ON) 4 % GEL Apply topically. Apply to right knee as needed    . Multiple Vitamins-Minerals (MULTIVITAMIN WITH MINERALS) tablet Take 1 tablet by mouth daily.    . pramipexole (MIRAPEX) 0.125 MG tablet Take 0.125 mg by mouth. Take one tablet at bedtime for restless leg syndrome    . primidone (MYSOLINE) 50 MG tablet Take 3 tablets (150 mg total) by mouth at bedtime. 90 tablet 11  . raloxifene (EVISTA) 60 MG tablet Take 60 mg by mouth at bedtime.     . timolol (TIMOPTIC) 0.5 % ophthalmic solution Place 1 drop into both eyes daily.   1  . VYTORIN 10-20 MG tablet Take 1 tablet by mouth daily.  0   No current facility-administered medications for this visit.      Allergies:  Allergies  Allergen Reactions  . Mycostatin [Nystatin] Swelling and Rash    Past Medical History, Surgical history, Social history, and Family History were reviewed and updated.  Physical Exam: There were no vitals taken for this visit.   ECOG: 1 General appearance: Alert, awake woman appeared without distress. Head: Normocephalic, without obvious abnormality  Oral mucosa:  His membranes are moist and pink. Eyes: No scleral icterus.  Pupils are equal and round reactive to light. Lymph nodes: Cervical, supraclavicular, and axillary nodes normal. Heart: regular, rate and rhythm.  Without murmurs rubs or gallops. Lung:  clear, no wheezing, rales, normal symmetric air entry. No dullness to percussion. Abdomin: Soft, non tender, good bowel sounds.  Musculoskeletal: No joint deformity or effusion.   Lab Results: Lab Results  Component Value Date   WBC 5.3 03/03/2017   HGB 9.0 (L)  03/03/2017   HCT 27.4 (L) 03/03/2017   MCV 99.5 03/03/2017   PLT 258 03/03/2017     Chemistry      Component Value Date/Time   NA 135 10/05/2016 1824   NA 135 (A) 04/01/2016   K 4.4 10/05/2016 1824   CL 103 10/05/2016 1824   CO2 24 10/05/2016 1824   BUN 21 (H) 10/05/2016 1824   BUN 19 04/01/2016   CREATININE 0.74 10/05/2016 1824   GLU 102 04/01/2016      Component Value Date/Time   CALCIUM 8.8 (L) 10/05/2016 1824   ALKPHOS 47 10/05/2016 1824   AST 20 10/05/2016 1824   ALT 16 10/05/2016 1824   BILITOT 0.1 (L) 10/05/2016 1824       Impression and Plan:  84 year old woman with the following issues:  1.  Multifactorial anemia: She has element of iron deficiency as well as chronic disease.  Myelodysplastic syndrome could also be a possibility.    Her hemoglobin on 05/05/2017 was 7.0 without any active bleeding noted.  Her iron levels in December 2017 showed a ferritin of 98.  Differential diagnosis was discussed today again with the patient and her daughter.  She could have an element of iron deficiency but certainly myelodysplastic syndrome or other bone marrow failure conditions could be contributing.  The only way to make that determination is to obtain a bone marrow biopsy.  We will continue to discuss the rationale for using this procedure and for the time being we will proceed with supportive transfusion before she becomes symptomatic.  We will also continue to monitor her iron periodically and replace as needed.  2.  Follow-up: 8 weeks to follow her progress.  15  minutes was spent with the patient face-to-face today.  More than 50% of time was dedicated to patient counseling, education, answering questions and coordination of her care.      Zola Button, MD 2/28/20194:04 PM

## 2017-05-06 ENCOUNTER — Telehealth: Payer: Self-pay | Admitting: Oncology

## 2017-05-06 ENCOUNTER — Telehealth: Payer: Self-pay | Admitting: *Deleted

## 2017-05-06 LAB — IRON AND TIBC
Iron: 54 ug/dL (ref 41–142)
Saturation Ratios: 21 % (ref 21–57)
TIBC: 256 ug/dL (ref 236–444)
UIBC: 202 ug/dL

## 2017-05-06 LAB — FERRITIN: Ferritin: 13 ng/mL (ref 9–269)

## 2017-05-06 NOTE — Telephone Encounter (Signed)
Lm on daughter Lisa [name]'s am patient's iron is normal and she will need a blood transfusion next week.

## 2017-05-06 NOTE — Telephone Encounter (Signed)
Scheduled blood at [facility] per 2/28 sch msg - left voicemail for daughter regarding appts.

## 2017-05-06 NOTE — Telephone Encounter (Signed)
-----   Message from Benjiman CoreFiras N [name], MD sent at 05/06/2017 10:15 AM EST ----- Please her daughter know her iron is normal and will need blood transfusion next week.

## 2017-05-10 ENCOUNTER — Other Ambulatory Visit: Payer: Self-pay

## 2017-05-10 ENCOUNTER — Inpatient Hospital Stay: Payer: Medicare Other | Attending: Oncology

## 2017-05-10 ENCOUNTER — Other Ambulatory Visit: Payer: Self-pay | Admitting: *Deleted

## 2017-05-10 DIAGNOSIS — D649 Anemia, unspecified: Secondary | ICD-10-CM

## 2017-05-10 DIAGNOSIS — D509 Iron deficiency anemia, unspecified: Secondary | ICD-10-CM | POA: Insufficient documentation

## 2017-05-10 LAB — CBC WITH DIFFERENTIAL (CANCER CENTER ONLY)
Basophils Absolute: 0 10*3/uL (ref 0.0–0.1)
Basophils Relative: 0 %
Eosinophils Absolute: 0.1 10*3/uL (ref 0.0–0.5)
Eosinophils Relative: 1 %
HCT: 23 % — ABNORMAL LOW (ref 34.8–46.6)
Hemoglobin: 6.9 g/dL — CL (ref 11.6–15.9)
Lymphocytes Relative: 12 %
Lymphs Abs: 0.7 10*3/uL — ABNORMAL LOW (ref 0.9–3.3)
MCH: 28.8 pg (ref 25.1–34.0)
MCHC: 30 g/dL — ABNORMAL LOW (ref 31.5–36.0)
MCV: 95.8 fL (ref 79.5–101.0)
Monocytes Absolute: 0.8 10*3/uL (ref 0.1–0.9)
Monocytes Relative: 14 %
Neutro Abs: 4.1 10*3/uL (ref 1.5–6.5)
Neutrophils Relative %: 73 %
Platelet Count: 217 10*3/uL (ref 145–400)
RBC: 2.4 MIL/uL — ABNORMAL LOW (ref 3.70–5.45)
RDW: 13.4 % (ref 11.2–14.5)
WBC Count: 5.6 10*3/uL (ref 3.9–10.3)

## 2017-05-10 LAB — ABO/RH: ABO/RH(D): A POS

## 2017-05-10 LAB — SAMPLE TO BLOOD BANK

## 2017-05-11 ENCOUNTER — Ambulatory Visit (HOSPITAL_COMMUNITY)
Admission: RE | Admit: 2017-05-11 | Discharge: 2017-05-11 | Disposition: A | Discharge status: Home / Self Care | Payer: Medicare Other | Source: Ambulatory Visit | Attending: Oncology | Admitting: Oncology

## 2017-05-11 DIAGNOSIS — D649 Anemia, unspecified: Secondary | ICD-10-CM

## 2017-05-11 LAB — PREPARE RBC (CROSSMATCH)

## 2017-05-11 MED ORDER — ACETAMINOPHEN 325 MG PO TABS
650.0000 mg | ORAL_TABLET | Freq: Once | ORAL | Status: AC
  Administered 2017-05-11: 650 mg via ORAL
  Filled 2017-05-11: qty 2

## 2017-05-11 MED ORDER — SODIUM CHLORIDE 0.9 % IV SOLN: 250.0000 mL | Freq: Once | INTRAVENOUS | Status: DC

## 2017-05-11 MED ORDER — DIPHENHYDRAMINE HCL 25 MG PO CAPS
25.0000 mg | ORAL_CAPSULE | Freq: Once | ORAL | Status: AC
  Administered 2017-05-11: 25 mg via ORAL
  Filled 2017-05-11: qty 1

## 2017-05-11 NOTE — Progress Notes (Signed)
PATIENT CARE CENTER NOTE  Diagnosis: Iron Deficiency Anemia    Provider: Dr. Clelia CroftShadad   Procedure: 2 units RBC's    Note: Patient received transfusion of 2 units of blood. Patient tolerated transfusion well with no adverse reaction. Vital signs remained stable throughout transfusion. Discharge instructions given to patient and patient's daughter. Patient alert, oriented and ambulatory at discharge.

## 2017-05-11 NOTE — Discharge Instructions (Signed)

## 2017-05-12 LAB — TYPE AND SCREEN
ABO/RH(D): A POS
Antibody Screen: NEGATIVE
Unit division: 0
Unit division: 0

## 2017-05-12 LAB — BPAM RBC
Blood Product Expiration Date: 201903212359
Blood Product Expiration Date: 201903212359
ISSUE DATE / TIME: 201903060931
ISSUE DATE / TIME: 201903060931
Unit Type and Rh: 6200
Unit Type and Rh: 6200

## 2017-05-13 DIAGNOSIS — D485 Neoplasm of uncertain behavior of skin: Secondary | ICD-10-CM | POA: Diagnosis not present

## 2017-05-13 DIAGNOSIS — L820 Inflamed seborrheic keratosis: Secondary | ICD-10-CM | POA: Diagnosis not present

## 2017-05-13 DIAGNOSIS — L821 Other seborrheic keratosis: Secondary | ICD-10-CM | POA: Diagnosis not present

## 2017-05-13 DIAGNOSIS — L570 Actinic keratosis: Secondary | ICD-10-CM | POA: Diagnosis not present

## 2017-05-16 DIAGNOSIS — E559 Vitamin D deficiency, unspecified: Secondary | ICD-10-CM | POA: Diagnosis not present

## 2017-05-16 DIAGNOSIS — R7301 Impaired fasting glucose: Secondary | ICD-10-CM | POA: Diagnosis not present

## 2017-05-16 DIAGNOSIS — E782 Mixed hyperlipidemia: Secondary | ICD-10-CM | POA: Diagnosis not present

## 2017-05-26 DIAGNOSIS — E559 Vitamin D deficiency, unspecified: Secondary | ICD-10-CM | POA: Diagnosis not present

## 2017-05-26 DIAGNOSIS — E782 Mixed hyperlipidemia: Secondary | ICD-10-CM | POA: Diagnosis not present

## 2017-05-26 DIAGNOSIS — R7301 Impaired fasting glucose: Secondary | ICD-10-CM | POA: Diagnosis not present

## 2017-05-26 DIAGNOSIS — I10 Essential (primary) hypertension: Secondary | ICD-10-CM | POA: Diagnosis not present

## 2017-05-30 ENCOUNTER — Encounter: Payer: Self-pay | Admitting: *Deleted

## 2017-06-30 ENCOUNTER — Telehealth: Payer: Self-pay | Admitting: Oncology

## 2017-06-30 ENCOUNTER — Inpatient Hospital Stay (HOSPITAL_BASED_OUTPATIENT_CLINIC_OR_DEPARTMENT_OTHER): Payer: Medicare Other | Admitting: Oncology

## 2017-06-30 ENCOUNTER — Inpatient Hospital Stay: Payer: Medicare Other | Attending: Oncology

## 2017-06-30 VITALS — BP 133/54 | HR 104 | Temp 98.4°F | Resp 18 | Ht 61.5 in | Wt 136.3 lb

## 2017-06-30 DIAGNOSIS — Z7982 Long term (current) use of aspirin: Secondary | ICD-10-CM | POA: Insufficient documentation

## 2017-06-30 DIAGNOSIS — Z79899 Other long term (current) drug therapy: Secondary | ICD-10-CM | POA: Diagnosis not present

## 2017-06-30 DIAGNOSIS — D509 Iron deficiency anemia, unspecified: Secondary | ICD-10-CM | POA: Diagnosis not present

## 2017-06-30 DIAGNOSIS — N189 Chronic kidney disease, unspecified: Secondary | ICD-10-CM | POA: Diagnosis not present

## 2017-06-30 DIAGNOSIS — D649 Anemia, unspecified: Secondary | ICD-10-CM

## 2017-06-30 DIAGNOSIS — D631 Anemia in chronic kidney disease: Secondary | ICD-10-CM | POA: Insufficient documentation

## 2017-06-30 LAB — CBC WITH DIFFERENTIAL (CANCER CENTER ONLY)
Basophils Absolute: 0 10*3/uL (ref 0.0–0.1)
Basophils Relative: 0 %
Eosinophils Absolute: 0 10*3/uL (ref 0.0–0.5)
Eosinophils Relative: 1 %
HCT: 22.8 % — ABNORMAL LOW (ref 34.8–46.6)
Hemoglobin: 7.1 g/dL — ABNORMAL LOW (ref 11.6–15.9)
Lymphocytes Relative: 8 %
Lymphs Abs: 0.4 10*3/uL — ABNORMAL LOW (ref 0.9–3.3)
MCH: 28.3 pg (ref 25.1–34.0)
MCHC: 31.3 g/dL — ABNORMAL LOW (ref 31.5–36.0)
MCV: 90.2 fL (ref 79.5–101.0)
Monocytes Absolute: 0.5 10*3/uL (ref 0.1–0.9)
Monocytes Relative: 11 %
Neutro Abs: 3.6 10*3/uL (ref 1.5–6.5)
Neutrophils Relative %: 80 %
Platelet Count: 225 10*3/uL (ref 145–400)
RBC: 2.53 MIL/uL — ABNORMAL LOW (ref 3.70–5.45)
RDW: 14.8 % — ABNORMAL HIGH (ref 11.2–14.5)
WBC Count: 4.6 10*3/uL (ref 3.9–10.3)

## 2017-06-30 LAB — IRON AND TIBC
Iron: 167 ug/dL — ABNORMAL HIGH (ref 41–142)
Saturation Ratios: 59 % — ABNORMAL HIGH (ref 21–57)
TIBC: 284 ug/dL (ref 236–444)
UIBC: 117 ug/dL

## 2017-06-30 LAB — FERRITIN: Ferritin: 10 ng/mL (ref 9–269)

## 2017-06-30 NOTE — Telephone Encounter (Signed)
Scheduled appt per 4/25 los - unable to schedule appts for next week due to capped day - pt aware will call when appt scheduled - gave patient AVS and calender for June appts./

## 2017-06-30 NOTE — Progress Notes (Signed)
Hematology and Oncology Follow Up Visit  Lisa [name] [id number] 1923/10/07 84 y.o. 06/30/2017 10:50 AM Lisa [name], MDBarnes, Lisa Mola, MD   Principle Diagnosis: 84 year old woman with anemia of chronic disease.  She has an element of renal insufficiency as well as iron deficiency diagnosed in July 2018.  Prior Therapy: IV iron infusion completed in November 2018.  She is also status post post packed red cell transfusion  Current therapy: Supportive care with transfusion and repeat IV iron.  Interim History: [name] presents today for a follow-up.  Since her last visit, she received 2 units of packed red cell transfusion without really any difference in her activity level her performance status.  She denies any excessive fatigue or tiredness she denies any shortness of breath.  She denied any dizziness or unsteadiness.  She reports no falls or syncope.  She denies any hematochezia or melena.   She does not report any headaches, blurry vision, syncope or seizures. She does not report any fevers, chills or sweats.  She does not report any chest pain, palpitation orthopnea.  She does not report any abdominal pain, as satiety or change in her bowel habits. She does not report any cough, wheezing or hemoptysis. She does not report any frequency urgency or hesitancy.  She does not report any skeletal complaints of arthralgias or myalgias.  She does not report lymphadenopathy or petechiae.  She does not report any skin rashes or lesions.  Remaining review of systems is negative.   Medications: I have reviewed the patient's current medications.  Current Outpatient Medications  Medication Sig Dispense Refill  . acetaminophen (TYLENOL) 325 MG tablet Take 2 tablets (650 mg total) by mouth every 6 (six) hours as needed for mild pain (or Fever >/= 101). 30 tablet 0  . aspirin EC 81 MG tablet Take 81 mg by mouth daily.    . calcium carbonate (OS-CAL) 600 MG TABS Take 600 mg by mouth daily.      . ferrous sulfate 325 (65 FE) MG tablet Take 325 mg by mouth daily with breakfast.    . loratadine (CLARITIN) 10 MG tablet Take 10 mg by mouth daily.    . Menthol, Topical Analgesic, (BIOFREEZE ROLL-ON) 4 % GEL Apply topically. Apply to right knee as needed    . Multiple Vitamins-Minerals (MULTIVITAMIN WITH MINERALS) tablet Take 1 tablet by mouth daily.    . pramipexole (MIRAPEX) 0.125 MG tablet Take 0.125 mg by mouth. Take one tablet at bedtime for restless leg syndrome    . primidone (MYSOLINE) 50 MG tablet Take 3 tablets (150 mg total) by mouth at bedtime. 90 tablet 11  . raloxifene (EVISTA) 60 MG tablet Take 60 mg by mouth at bedtime.     . timolol (TIMOPTIC) 0.5 % ophthalmic solution Place 1 drop into both eyes daily.   1  . VYTORIN 10-20 MG tablet Take 1 tablet by mouth daily.  0   No current facility-administered medications for this visit.      Allergies:  Allergies  Allergen Reactions  . Mycostatin [Nystatin] Swelling and Rash    Past Medical History, Surgical history, Social history, and Family History were reviewed and updated.  Physical Exam: Blood pressure (!) 133/54, pulse (!) 104, temperature 98.4 F (36.9 C), temperature source Oral, resp. rate 18, height 5' 1.5" (1.562 m), weight 136 lb 4.8 oz (61.8 kg), SpO2 100 %.   ECOG: 1 General appearance: Comfortable appearing woman without distress. Head: Atraumatic without abnormalities. Oral mucosa: No thrush or  ulcers. Eyes: Pupils are equal and round reactive to light. Lymph nodes: Cervical, supraclavicular, and axillary without any lymphadenopathy. Heart: regular, rate without any murmurs. Lung:  Clear without any rhonchi or wheezes or dullness to percussion. Abdomin: Soft, without any rebound or guarding. Musculoskeletal: No clubbing or cyanosis. Skin: No rashes or lesions.  Lab Results: Lab Results  Component Value Date   WBC 4.6 06/30/2017   HGB 7.1 (L) 06/30/2017   HCT 22.8 (L) 06/30/2017   MCV 90.2  06/30/2017   PLT 225 06/30/2017     Chemistry      Component Value Date/Time   NA 135 10/05/2016 1824   NA 135 (A) 04/01/2016   K 4.4 10/05/2016 1824   CL 103 10/05/2016 1824   CO2 24 10/05/2016 1824   BUN 21 (H) 10/05/2016 1824   BUN 19 04/01/2016   CREATININE 0.74 10/05/2016 1824   GLU 102 04/01/2016      Component Value Date/Time   CALCIUM 8.8 (L) 10/05/2016 1824   ALKPHOS 47 10/05/2016 1824   AST 20 10/05/2016 1824   ALT 16 10/05/2016 1824   BILITOT 0.1 (L) 10/05/2016 1824       Impression and Plan:  84 year old woman with the following issues:  1.  Anemia unspecified: The etiology of her anemia is multifactorial at this time which includes iron deficiency, chronic disease and renal insufficiency.  Myelodysplastic syndrome is also a distinct possibility.  To fully evaluate her condition moment a biopsy is needed at this time.  Risks and benefits of this procedure was discussed today in detail with the patient and her daughter.  Complications that include bleeding, pain or infection are very low at this time.  The benefit would be to obtain a definitive diagnosis especially related to myelodysplastic syndrome which can impact treatment.  After discussion today she declined bone marrow biopsy because of her age.  I discussed the use of growth factor support in the form of Aranesp.  Complications associated with this medication was discussed today which include hypertension, injection related problems and thromboembolism.  The indication for use of Aranesp would be chronic renal insufficiency.  I will check iron stores to ensure adequate replacement and start her injection in the near future.  She is agreeable to proceed with this plan.  Her hemoglobin today has declined since the last transfusion and will proceed with 2 units of packed red cells in the next week or so in addition to Aranesp.   2.  Follow-up: 6 weeks to follow her progress.  25  minutes was spent with the  patient face-to-face today.  More than 50% of time was dedicated to patient counseling, education, discussing future plan of care      Zola Button, MD 4/25/201910:50 AM

## 2017-07-01 ENCOUNTER — Telehealth: Payer: Self-pay | Admitting: Oncology

## 2017-07-01 ENCOUNTER — Other Ambulatory Visit: Payer: Medicare Other

## 2017-07-01 ENCOUNTER — Ambulatory Visit: Payer: Medicare Other | Admitting: Oncology

## 2017-07-01 NOTE — Telephone Encounter (Signed)
Scheduled appt per 4/25 los- pt is aware of appt date and time.

## 2017-07-04 ENCOUNTER — Telehealth: Payer: Self-pay | Admitting: Oncology

## 2017-07-04 NOTE — Telephone Encounter (Signed)
Patient called to reschedule 6/5 however she only wants afternoon appointments. Per stacey she will call patient once Dr tells her when he will see her again and the time for injection

## 2017-07-05 ENCOUNTER — Telehealth: Payer: Self-pay | Admitting: Oncology

## 2017-07-05 NOTE — Telephone Encounter (Signed)
Called patient regarding 6/11 per MD

## 2017-07-06 ENCOUNTER — Other Ambulatory Visit: Payer: Medicare Other

## 2017-07-07 ENCOUNTER — Other Ambulatory Visit: Payer: Self-pay | Admitting: *Deleted

## 2017-07-07 ENCOUNTER — Telehealth: Payer: Self-pay | Admitting: Oncology

## 2017-07-07 ENCOUNTER — Inpatient Hospital Stay: Payer: Medicare Other

## 2017-07-07 ENCOUNTER — Inpatient Hospital Stay: Payer: Medicare Other | Attending: Oncology

## 2017-07-07 ENCOUNTER — Other Ambulatory Visit: Payer: Self-pay

## 2017-07-07 ENCOUNTER — Other Ambulatory Visit: Payer: Self-pay | Admitting: Oncology

## 2017-07-07 VITALS — BP 150/65 | HR 90 | Temp 98.4°F | Resp 20

## 2017-07-07 DIAGNOSIS — D631 Anemia in chronic kidney disease: Secondary | ICD-10-CM

## 2017-07-07 DIAGNOSIS — N189 Chronic kidney disease, unspecified: Principal | ICD-10-CM

## 2017-07-07 DIAGNOSIS — I129 Hypertensive chronic kidney disease with stage 1 through stage 4 chronic kidney disease, or unspecified chronic kidney disease: Secondary | ICD-10-CM | POA: Diagnosis not present

## 2017-07-07 DIAGNOSIS — Z79899 Other long term (current) drug therapy: Secondary | ICD-10-CM | POA: Insufficient documentation

## 2017-07-07 DIAGNOSIS — D649 Anemia, unspecified: Secondary | ICD-10-CM

## 2017-07-07 DIAGNOSIS — D509 Iron deficiency anemia, unspecified: Secondary | ICD-10-CM

## 2017-07-07 LAB — CBC WITH DIFFERENTIAL (CANCER CENTER ONLY)
Basophils Absolute: 0 10*3/uL (ref 0.0–0.1)
Basophils Relative: 0 %
Eosinophils Absolute: 0.1 10*3/uL (ref 0.0–0.5)
Eosinophils Relative: 1 %
HCT: 21.8 % — ABNORMAL LOW (ref 34.8–46.6)
Hemoglobin: 6.5 g/dL — CL (ref 11.6–15.9)
Lymphocytes Relative: 14 %
Lymphs Abs: 0.9 10*3/uL (ref 0.9–3.3)
MCH: 27.7 pg (ref 25.1–34.0)
MCHC: 29.8 g/dL — ABNORMAL LOW (ref 31.5–36.0)
MCV: 92.8 fL (ref 79.5–101.0)
Monocytes Absolute: 0.7 10*3/uL (ref 0.1–0.9)
Monocytes Relative: 12 %
Neutro Abs: 4.5 10*3/uL (ref 1.5–6.5)
Neutrophils Relative %: 73 %
Platelet Count: 197 10*3/uL (ref 145–400)
RBC: 2.35 MIL/uL — ABNORMAL LOW (ref 3.70–5.45)
RDW: 14.7 % — ABNORMAL HIGH (ref 11.2–14.5)
WBC Count: 6.2 10*3/uL (ref 3.9–10.3)

## 2017-07-07 LAB — CMP (CANCER CENTER ONLY)
ALT: 12 U/L (ref 0–55)
AST: 17 U/L (ref 5–34)
Albumin: 3.4 g/dL — ABNORMAL LOW (ref 3.5–5.0)
Alkaline Phosphatase: 69 U/L (ref 40–150)
Anion gap: 5 (ref 3–11)
BUN: 22 mg/dL (ref 7–26)
CO2: 27 mmol/L (ref 22–29)
Calcium: 8.8 mg/dL (ref 8.4–10.4)
Chloride: 104 mmol/L (ref 98–109)
Creatinine: 0.86 mg/dL (ref 0.60–1.10)
GFR, Est AFR Am: >60 mL/min (ref >60)
GFR, Estimated: 57 mL/min — ABNORMAL LOW (ref >60)
Glucose, Bld: 105 mg/dL (ref 70–140)
Potassium: 4.2 mmol/L (ref 3.5–5.1)
Sodium: 136 mmol/L (ref 136–145)
Total Bilirubin: <0.2 mg/dL — ABNORMAL LOW (ref 0.2–1.2)
Total Protein: 5.8 g/dL — ABNORMAL LOW (ref 6.4–8.3)

## 2017-07-07 LAB — SAMPLE TO BLOOD BANK

## 2017-07-07 LAB — PREPARE RBC (CROSSMATCH)

## 2017-07-07 MED ORDER — DARBEPOETIN ALFA 300 MCG/0.6ML IJ SOSY
300.0000 ug | PREFILLED_SYRINGE | Freq: Once | INTRAMUSCULAR | Status: AC
  Administered 2017-07-07: 300 ug via SUBCUTANEOUS

## 2017-07-07 NOTE — Patient Instructions (Signed)

## 2017-07-07 NOTE — Telephone Encounter (Signed)
Appt s scheduled AVS/Calendar printed per 5/2 los °

## 2017-07-08 ENCOUNTER — Inpatient Hospital Stay: Payer: Medicare Other

## 2017-07-08 DIAGNOSIS — N189 Chronic kidney disease, unspecified: Principal | ICD-10-CM

## 2017-07-08 DIAGNOSIS — D631 Anemia in chronic kidney disease: Secondary | ICD-10-CM

## 2017-07-08 DIAGNOSIS — I129 Hypertensive chronic kidney disease with stage 1 through stage 4 chronic kidney disease, or unspecified chronic kidney disease: Secondary | ICD-10-CM | POA: Diagnosis not present

## 2017-07-08 MED ORDER — ACETAMINOPHEN 325 MG PO TABS
650.0000 mg | ORAL_TABLET | Freq: Once | ORAL | Status: AC
  Administered 2017-07-08: 650 mg via ORAL

## 2017-07-08 MED ORDER — DIPHENHYDRAMINE HCL 25 MG PO CAPS
25.0000 mg | ORAL_CAPSULE | Freq: Once | ORAL | Status: AC
  Administered 2017-07-08: 25 mg via ORAL

## 2017-07-08 MED ORDER — DIPHENHYDRAMINE HCL 25 MG PO CAPS
ORAL_CAPSULE | ORAL | Status: AC
  Filled 2017-07-08: qty 1

## 2017-07-08 MED ORDER — SODIUM CHLORIDE 0.9 % IV SOLN
250.0000 mL | Freq: Once | INTRAVENOUS | Status: AC
  Administered 2017-07-08: 250 mL via INTRAVENOUS

## 2017-07-08 MED ORDER — ACETAMINOPHEN 325 MG PO TABS
ORAL_TABLET | ORAL | Status: AC
  Filled 2017-07-08: qty 2

## 2017-07-08 NOTE — Patient Instructions (Signed)

## 2017-07-09 LAB — BPAM RBC
Blood Product Expiration Date: 201905212359
Blood Product Expiration Date: 201905212359
ISSUE DATE / TIME: 201905031215
ISSUE DATE / TIME: 201905031215
Unit Type and Rh: 6200
Unit Type and Rh: 6200

## 2017-07-09 LAB — TYPE AND SCREEN
ABO/RH(D): A POS
Antibody Screen: NEGATIVE
Unit division: 0
Unit division: 0

## 2017-07-28 ENCOUNTER — Inpatient Hospital Stay: Payer: Medicare Other

## 2017-07-28 DIAGNOSIS — I129 Hypertensive chronic kidney disease with stage 1 through stage 4 chronic kidney disease, or unspecified chronic kidney disease: Secondary | ICD-10-CM | POA: Diagnosis not present

## 2017-07-28 DIAGNOSIS — B351 Tinea unguium: Secondary | ICD-10-CM | POA: Diagnosis not present

## 2017-07-28 DIAGNOSIS — I739 Peripheral vascular disease, unspecified: Secondary | ICD-10-CM | POA: Diagnosis not present

## 2017-07-28 DIAGNOSIS — L603 Nail dystrophy: Secondary | ICD-10-CM | POA: Diagnosis not present

## 2017-07-28 DIAGNOSIS — M79674 Pain in right toe(s): Secondary | ICD-10-CM | POA: Diagnosis not present

## 2017-07-28 DIAGNOSIS — D649 Anemia, unspecified: Secondary | ICD-10-CM

## 2017-07-28 LAB — CBC WITH DIFFERENTIAL (CANCER CENTER ONLY)
Basophils Absolute: 0 10*3/uL (ref 0.0–0.1)
Basophils Relative: 0 %
Eosinophils Absolute: 0 10*3/uL (ref 0.0–0.5)
Eosinophils Relative: 1 %
HCT: 27.4 % — ABNORMAL LOW (ref 34.8–46.6)
Hemoglobin: 8.2 g/dL — ABNORMAL LOW (ref 11.6–15.9)
Lymphocytes Relative: 11 %
Lymphs Abs: 0.6 10*3/uL — ABNORMAL LOW (ref 0.9–3.3)
MCH: 27.3 pg (ref 25.1–34.0)
MCHC: 29.9 g/dL — ABNORMAL LOW (ref 31.5–36.0)
MCV: 91.3 fL (ref 79.5–101.0)
Monocytes Absolute: 0.7 10*3/uL (ref 0.1–0.9)
Monocytes Relative: 12 %
Neutro Abs: 4.5 10*3/uL (ref 1.5–6.5)
Neutrophils Relative %: 76 %
Platelet Count: 181 10*3/uL (ref 145–400)
RBC: 3 MIL/uL — ABNORMAL LOW (ref 3.70–5.45)
RDW: 14.8 % — ABNORMAL HIGH (ref 11.2–14.5)
WBC Count: 5.8 10*3/uL (ref 3.9–10.3)

## 2017-08-10 ENCOUNTER — Ambulatory Visit: Payer: Medicare Other | Admitting: Oncology

## 2017-08-10 ENCOUNTER — Other Ambulatory Visit: Payer: Medicare Other

## 2017-08-10 ENCOUNTER — Ambulatory Visit: Payer: Medicare Other

## 2017-08-15 ENCOUNTER — Other Ambulatory Visit: Payer: Self-pay | Admitting: *Deleted

## 2017-08-15 DIAGNOSIS — N189 Chronic kidney disease, unspecified: Principal | ICD-10-CM

## 2017-08-15 DIAGNOSIS — D508 Other iron deficiency anemias: Secondary | ICD-10-CM

## 2017-08-15 DIAGNOSIS — D631 Anemia in chronic kidney disease: Secondary | ICD-10-CM

## 2017-08-16 ENCOUNTER — Other Ambulatory Visit: Payer: Self-pay | Admitting: *Deleted

## 2017-08-16 ENCOUNTER — Inpatient Hospital Stay: Payer: Medicare Other | Attending: Oncology

## 2017-08-16 ENCOUNTER — Inpatient Hospital Stay: Payer: Medicare Other

## 2017-08-16 ENCOUNTER — Telehealth: Payer: Self-pay | Admitting: Oncology

## 2017-08-16 ENCOUNTER — Inpatient Hospital Stay (HOSPITAL_BASED_OUTPATIENT_CLINIC_OR_DEPARTMENT_OTHER): Payer: Medicare Other | Admitting: Oncology

## 2017-08-16 VITALS — BP 134/48 | HR 96 | Temp 98.7°F | Resp 18 | Ht 61.5 in | Wt 139.9 lb

## 2017-08-16 DIAGNOSIS — Z79899 Other long term (current) drug therapy: Secondary | ICD-10-CM

## 2017-08-16 DIAGNOSIS — N189 Chronic kidney disease, unspecified: Principal | ICD-10-CM

## 2017-08-16 DIAGNOSIS — D631 Anemia in chronic kidney disease: Secondary | ICD-10-CM

## 2017-08-16 DIAGNOSIS — D508 Other iron deficiency anemias: Secondary | ICD-10-CM

## 2017-08-16 DIAGNOSIS — I129 Hypertensive chronic kidney disease with stage 1 through stage 4 chronic kidney disease, or unspecified chronic kidney disease: Secondary | ICD-10-CM | POA: Insufficient documentation

## 2017-08-16 DIAGNOSIS — Z7982 Long term (current) use of aspirin: Secondary | ICD-10-CM | POA: Diagnosis not present

## 2017-08-16 LAB — CBC WITH DIFFERENTIAL (CANCER CENTER ONLY)
Basophils Absolute: 0 10*3/uL (ref 0.0–0.1)
Basophils Relative: 0 %
Eosinophils Absolute: 0.1 10*3/uL (ref 0.0–0.5)
Eosinophils Relative: 1 %
HCT: 21.9 % — ABNORMAL LOW (ref 34.8–46.6)
Hemoglobin: 6.6 g/dL — CL (ref 11.6–15.9)
Lymphocytes Relative: 10 %
Lymphs Abs: 0.6 10*3/uL — ABNORMAL LOW (ref 0.9–3.3)
MCH: 28.2 pg (ref 25.1–34.0)
MCHC: 30.1 g/dL — ABNORMAL LOW (ref 31.5–36.0)
MCV: 93.6 fL (ref 79.5–101.0)
Monocytes Absolute: 0.7 10*3/uL (ref 0.1–0.9)
Monocytes Relative: 13 %
Neutro Abs: 4.2 10*3/uL (ref 1.5–6.5)
Neutrophils Relative %: 76 %
Platelet Count: 198 10*3/uL (ref 145–400)
RBC: 2.34 MIL/uL — ABNORMAL LOW (ref 3.70–5.45)
RDW: 16.5 % — ABNORMAL HIGH (ref 11.2–14.5)
WBC Count: 5.5 10*3/uL (ref 3.9–10.3)

## 2017-08-16 LAB — CMP (CANCER CENTER ONLY)
ALT: 10 U/L (ref 0–55)
AST: 16 U/L (ref 5–34)
Albumin: 3.3 g/dL — ABNORMAL LOW (ref 3.5–5.0)
Alkaline Phosphatase: 70 U/L (ref 40–150)
Anion gap: 5 (ref 3–11)
BUN: 18 mg/dL (ref 7–26)
CO2: 28 mmol/L (ref 22–29)
Calcium: 8.4 mg/dL (ref 8.4–10.4)
Chloride: 104 mmol/L (ref 98–109)
Creatinine: 1.08 mg/dL (ref 0.60–1.10)
GFR, Est AFR Am: 50 mL/min — ABNORMAL LOW (ref >60)
GFR, Estimated: 44 mL/min — ABNORMAL LOW (ref >60)
Glucose, Bld: 112 mg/dL (ref 70–140)
Potassium: 4.4 mmol/L (ref 3.5–5.1)
Sodium: 137 mmol/L (ref 136–145)
Total Bilirubin: <0.2 mg/dL — ABNORMAL LOW (ref 0.2–1.2)
Total Protein: 5.6 g/dL — ABNORMAL LOW (ref 6.4–8.3)

## 2017-08-16 LAB — SAMPLE TO BLOOD BANK

## 2017-08-16 MED ORDER — DARBEPOETIN ALFA 300 MCG/0.6ML IJ SOSY
PREFILLED_SYRINGE | INTRAMUSCULAR | Status: AC
Start: 2017-08-16 — End: ?
  Filled 2017-08-16: qty 0.6

## 2017-08-16 MED ORDER — DARBEPOETIN ALFA 300 MCG/0.6ML IJ SOSY
300.0000 ug | PREFILLED_SYRINGE | Freq: Once | INTRAMUSCULAR | Status: AC
  Administered 2017-08-16: 300 ug via SUBCUTANEOUS

## 2017-08-16 NOTE — Progress Notes (Signed)
Hematology and Oncology Follow Up Visit  Lisa [name] [id number] 02/11/1924 84 y.o. 08/16/2017 3:21 PM Lisa [name], MDBarnes, Lisa Mola, MD   Principle Diagnosis: 84 year old woman with multifactorial anemia related to renal insufficiency, chronic disease.  Prior Therapy: IV iron infusion completed in November 2018.  She is also status post post packed red cell transfusion  Current therapy: Aranesp 300 mcg every 4 weeks with supportive transfusion as needed.  Interim History: [name] is here for a follow-up visit.  Since last visit, she started Aranesp injection and reports no complications related to it.  She did receive packed red cell transfusion in May for hemoglobin of 6.5.  On Jul 28, 2017 her follow-up hemoglobin was up to 8.2.  She reports increased fatigue since the last visit with slight improvement after packed red cell transfusion.  She denies any shortness of breath or dyspnea on exertion.  She denies any worsening lower extremity edema.  She remains ambulatory with the help of a walker without any falls or syncope.   She does not report any headaches, blurry vision, syncope or seizures.  She denies any peripheral neuropathy or alteration in mental status.  She does not report any fevers, chills or sweats.  She does not report any chest pain, palpitation orthopnea.  She denies any dyspnea on exertion.  She does not report any nausea, vomiting abdominal pain, or change in her bowel habits. She does not report any cough, wheezing or hemoptysis. She does not report any frequency urgency or hesitancy.  She does not report any bone pain or pathological fractures. She does not report lymphadenopathy or easy bruising.  She does not report any skin skin irritation or masses.  Remaining review of systems is negative.   Medications: I have reviewed the patient's current medications.  Current Outpatient Medications  Medication Sig Dispense Refill  . acetaminophen (TYLENOL) 325 MG  tablet Take 2 tablets (650 mg total) by mouth every 6 (six) hours as needed for mild pain (or Fever >/= 101). 30 tablet 0  . aspirin EC 81 MG tablet Take 81 mg by mouth daily.    . calcium carbonate (OS-CAL) 600 MG TABS Take 600 mg by mouth daily.     . ferrous sulfate 325 (65 FE) MG tablet Take 325 mg by mouth daily with breakfast.    . loratadine (CLARITIN) 10 MG tablet Take 10 mg by mouth daily.    . Menthol, Topical Analgesic, (BIOFREEZE ROLL-ON) 4 % GEL Apply topically. Apply to right knee as needed    . Multiple Vitamins-Minerals (MULTIVITAMIN WITH MINERALS) tablet Take 1 tablet by mouth daily.    . pramipexole (MIRAPEX) 0.125 MG tablet Take 0.125 mg by mouth. Take one tablet at bedtime for restless leg syndrome    . primidone (MYSOLINE) 50 MG tablet Take 3 tablets (150 mg total) by mouth at bedtime. 90 tablet 11  . raloxifene (EVISTA) 60 MG tablet Take 60 mg by mouth at bedtime.     . timolol (TIMOPTIC) 0.5 % ophthalmic solution Place 1 drop into both eyes daily.   1  . VYTORIN 10-20 MG tablet Take 1 tablet by mouth daily.  0   No current facility-administered medications for this visit.      Allergies:  Allergies  Allergen Reactions  . Mycostatin [Nystatin] Swelling and Rash    Past Medical History, Surgical history, Social history, and Family History were reviewed and updated.  Physical Exam:  Blood pressure (!) 134/48, pulse 96, temperature 98.7 F (37.1  C), temperature source Oral, resp. rate 18, height 5' 1.5" (1.562 m), weight 139 lb 14.4 oz (63.5 kg), SpO2 100 %.   ECOG: 1 General appearance: Alert, awake woman without distress.. Head: Normocephalic without abnormalities. Oral mucosa: Mucous membranes are moist and pink.  No oral ulcers or thrush Eyes: Sclera anicteric. Lymph nodes: No lymphadenopathy noted in the cervical, supraclavicular, or axillary lymph nodes. Heart: Regular rate with S1 and S2.  No leg edema. Lung:  Clear without any rhonchi, wheezes or  dullness to percussion. Abdomin: Soft, any rebound or guarding.  No shifting dullness or ascites. Musculoskeletal: No joint deformity or effusion. Skin: Ecchymosis or petechiae.  Lab Results: Lab Results  Component Value Date   WBC 5.8 07/28/2017   HGB 8.2 (L) 07/28/2017   HCT 27.4 (L) 07/28/2017   MCV 91.3 07/28/2017   PLT 181 07/28/2017     Chemistry      Component Value Date/Time   NA 136 07/07/2017 1426   NA 135 (A) 04/01/2016   K 4.2 07/07/2017 1426   CL 104 07/07/2017 1426   CO2 27 07/07/2017 1426   BUN 22 07/07/2017 1426   BUN 19 04/01/2016   CREATININE 0.86 07/07/2017 1426   GLU 102 04/01/2016      Component Value Date/Time   CALCIUM 8.8 07/07/2017 1426   ALKPHOS 69 07/07/2017 1426   AST 17 07/07/2017 1426   ALT 12 07/07/2017 1426   BILITOT <0.2 (L) 07/07/2017 1426       Impression and Plan:  84 year old woman with  1.  Multifactorial anemia: She has element of anemia of renal disease as well as chronic disease.  MDS could also be a possibility but she refused bone marrow biopsy.  She is currently on Aranesp 300 mcg every 4 weeks as started in May 2019.  She also receives packed red cell transfusion periodically.  Risks and benefits of continuing this therapy was reviewed today and the plan is to increase the frequency of Aranesp to every 3 weeks and continue with transfusion as needed.  I will repeat iron studies in 3 weeks to ensure stability.   2.  Follow-up: In 3 weeks for laboratory check, Aranesp and possible packed red cell transfusion if her hemoglobin is below 7.  15  minutes was spent with the patient face-to-face today.  More than 50% of time was dedicated to patient counseling, education, coordinating her care.      Zola Button, MD 6/11/20193:21 PM

## 2017-08-16 NOTE — Addendum Note (Signed)
Addended by: Benjiman CoreSHADAD, Opaline Reyburn N on: 08/16/2017 04:00 PM   Modules accepted: Orders

## 2017-08-16 NOTE — Telephone Encounter (Signed)
Appointments scheduled AVS/Calendar printed per 6/11 los °

## 2017-08-16 NOTE — Patient Instructions (Signed)

## 2017-08-17 ENCOUNTER — Other Ambulatory Visit: Payer: Self-pay

## 2017-08-17 ENCOUNTER — Other Ambulatory Visit: Payer: Self-pay | Admitting: *Deleted

## 2017-08-17 DIAGNOSIS — D631 Anemia in chronic kidney disease: Secondary | ICD-10-CM

## 2017-08-17 DIAGNOSIS — N189 Chronic kidney disease, unspecified: Principal | ICD-10-CM

## 2017-08-18 ENCOUNTER — Inpatient Hospital Stay: Payer: Medicare Other

## 2017-08-18 DIAGNOSIS — N189 Chronic kidney disease, unspecified: Principal | ICD-10-CM

## 2017-08-18 DIAGNOSIS — D631 Anemia in chronic kidney disease: Secondary | ICD-10-CM

## 2017-08-18 DIAGNOSIS — I129 Hypertensive chronic kidney disease with stage 1 through stage 4 chronic kidney disease, or unspecified chronic kidney disease: Secondary | ICD-10-CM | POA: Diagnosis not present

## 2017-08-18 MED ORDER — ACETAMINOPHEN 325 MG PO TABS
ORAL_TABLET | ORAL | Status: AC
  Filled 2017-08-18: qty 2

## 2017-08-18 MED ORDER — DIPHENHYDRAMINE HCL 25 MG PO CAPS
25.0000 mg | ORAL_CAPSULE | Freq: Once | ORAL | Status: AC
  Administered 2017-08-18: 25 mg via ORAL

## 2017-08-18 MED ORDER — SODIUM CHLORIDE 0.9 % IV SOLN
250.0000 mL | Freq: Once | INTRAVENOUS | Status: AC
  Administered 2017-08-18: 250 mL via INTRAVENOUS

## 2017-08-18 MED ORDER — ACETAMINOPHEN 325 MG PO TABS
650.0000 mg | ORAL_TABLET | Freq: Once | ORAL | Status: AC
  Administered 2017-08-18: 650 mg via ORAL

## 2017-08-18 MED ORDER — DIPHENHYDRAMINE HCL 25 MG PO CAPS
ORAL_CAPSULE | ORAL | Status: AC
  Filled 2017-08-18: qty 1

## 2017-08-19 LAB — TYPE AND SCREEN
ABO/RH(D): A POS
Antibody Screen: NEGATIVE
Unit division: 0
Unit division: 0

## 2017-08-19 LAB — BPAM RBC
Blood Product Expiration Date: 201907012359
Blood Product Expiration Date: 201907012359
ISSUE DATE / TIME: 201906131357
ISSUE DATE / TIME: 201906131357
Unit Type and Rh: 6200
Unit Type and Rh: 6200

## 2017-08-20 ENCOUNTER — Other Ambulatory Visit: Payer: Self-pay | Admitting: Neurology

## 2017-08-22 ENCOUNTER — Other Ambulatory Visit: Payer: Self-pay

## 2017-08-22 MED ORDER — PRIMIDONE 50 MG PO TABS: 150.0000 mg | ORAL_TABLET | Freq: Every day | ORAL | 0 refills | Status: DC

## 2017-08-30 ENCOUNTER — Telehealth: Payer: Self-pay | Admitting: Neurology

## 2017-08-30 MED ORDER — PRIMIDONE 50 MG PO TABS: 150.0000 mg | ORAL_TABLET | Freq: Every day | ORAL | 0 refills | Status: DC

## 2017-08-30 NOTE — Addendum Note (Signed)
Addended by: Bertram SavinULBERTSON, Travion Ke L on: 08/30/2017 03:03 PM   Modules accepted: Orders

## 2017-08-30 NOTE — Telephone Encounter (Signed)
Patient's daughter Lisa [name] calling to discuss why primidone (MYSOLINE) 50 MG tablet was only filled for 30 days. I advised it has almost been a year since patient was seen and for medication refills she probably needs an appointment with Dr. Lucia GaskinsAhern. On last office visit it says return as needed. She would like a call back from nurse to discuss and can be reached at [phone].

## 2017-08-30 NOTE — Telephone Encounter (Addendum)
Spoke with pt's daughter Lisa [name]. She stated that the patient has been to so many appointments and is having to see a hematologist/oncologist every 3 weeks for hemoglobin issue. She would like for Dr. Lucia GaskinsAhern to see the patient in the fall if possible. Pt tremors has been stable, not really shaking in the hands. She has had some numbness in her hands but this has been going on for over a year and pt was previously seen for CTS by orthopedics. RN advised we just to need see her once a year for refills. Appt scheduled for Thurs 11/24/17 @ 3:00 arrival time 2:30 per request. Advised we can refill enough Primodone to get pt through appt. Daughter requested a 3 month supply if possible as this is what she was getting before.

## 2017-08-30 NOTE — Telephone Encounter (Signed)
Can you schedule her with an NP instead? thanks

## 2017-08-31 NOTE — Telephone Encounter (Signed)
Spoke with the pt's daughter and discussed pt being scheduled with NP for this f/u instead. She would like to speak with the pt this afternoon and will call us back on Friday between 8-12.

## 2017-09-01 NOTE — Telephone Encounter (Signed)
FYI Pt daughterEmmit Alexanders( [place],[name] [phone] ) has called back to let RN Toma CopierBethany know that pt would prefer to just see Dr Lucia GaskinsAhern so she will keep her Sept appointment.  No call back requested

## 2017-09-06 ENCOUNTER — Inpatient Hospital Stay: Payer: Medicare Other | Attending: Oncology

## 2017-09-06 ENCOUNTER — Inpatient Hospital Stay: Payer: Medicare Other

## 2017-09-06 VITALS — BP 142/68 | HR 92 | Temp 98.2°F | Resp 19

## 2017-09-06 DIAGNOSIS — D509 Iron deficiency anemia, unspecified: Secondary | ICD-10-CM | POA: Insufficient documentation

## 2017-09-06 DIAGNOSIS — N189 Chronic kidney disease, unspecified: Principal | ICD-10-CM

## 2017-09-06 DIAGNOSIS — D631 Anemia in chronic kidney disease: Secondary | ICD-10-CM

## 2017-09-06 DIAGNOSIS — Z7982 Long term (current) use of aspirin: Secondary | ICD-10-CM | POA: Insufficient documentation

## 2017-09-06 DIAGNOSIS — Z79899 Other long term (current) drug therapy: Secondary | ICD-10-CM | POA: Insufficient documentation

## 2017-09-06 DIAGNOSIS — I129 Hypertensive chronic kidney disease with stage 1 through stage 4 chronic kidney disease, or unspecified chronic kidney disease: Secondary | ICD-10-CM | POA: Insufficient documentation

## 2017-09-06 DIAGNOSIS — D649 Anemia, unspecified: Secondary | ICD-10-CM

## 2017-09-06 LAB — CBC WITH DIFFERENTIAL (CANCER CENTER ONLY)
Basophils Absolute: 0 10*3/uL (ref 0.0–0.1)
Basophils Relative: 0 %
Eosinophils Absolute: 0 10*3/uL (ref 0.0–0.5)
Eosinophils Relative: 1 %
HCT: 25.4 % — ABNORMAL LOW (ref 34.8–46.6)
Hemoglobin: 8.2 g/dL — ABNORMAL LOW (ref 11.6–15.9)
Lymphocytes Relative: 9 %
Lymphs Abs: 0.6 10*3/uL — ABNORMAL LOW (ref 0.9–3.3)
MCH: 28 pg (ref 25.1–34.0)
MCHC: 32.3 g/dL (ref 31.5–36.0)
MCV: 86.5 fL (ref 79.5–101.0)
Monocytes Absolute: 0.8 10*3/uL (ref 0.1–0.9)
Monocytes Relative: 12 %
Neutro Abs: 5.1 10*3/uL (ref 1.5–6.5)
Neutrophils Relative %: 78 %
Platelet Count: 237 10*3/uL (ref 145–400)
RBC: 2.94 MIL/uL — ABNORMAL LOW (ref 3.70–5.45)
RDW: 15.9 % — ABNORMAL HIGH (ref 11.2–14.5)
WBC Count: 6.5 10*3/uL (ref 3.9–10.3)

## 2017-09-06 LAB — FERRITIN: Ferritin: 15 ng/mL (ref 11–307)

## 2017-09-06 LAB — IRON AND TIBC
Iron: 24 ug/dL — ABNORMAL LOW (ref 41–142)
Saturation Ratios: 10 % — ABNORMAL LOW (ref 21–57)
TIBC: 255 ug/dL (ref 236–444)
UIBC: 231 ug/dL

## 2017-09-06 MED ORDER — DARBEPOETIN ALFA 300 MCG/0.6ML IJ SOSY
300.0000 ug | PREFILLED_SYRINGE | Freq: Once | INTRAMUSCULAR | Status: AC
  Administered 2017-09-06: 300 ug via SUBCUTANEOUS

## 2017-09-06 NOTE — Patient Instructions (Signed)

## 2017-09-12 DIAGNOSIS — R350 Frequency of micturition: Secondary | ICD-10-CM | POA: Diagnosis not present

## 2017-09-12 DIAGNOSIS — N390 Urinary tract infection, site not specified: Secondary | ICD-10-CM | POA: Diagnosis not present

## 2017-09-12 DIAGNOSIS — R32 Unspecified urinary incontinence: Secondary | ICD-10-CM | POA: Diagnosis not present

## 2017-09-16 ENCOUNTER — Encounter: Payer: Self-pay | Admitting: Oncology

## 2017-09-16 DIAGNOSIS — E878 Other disorders of electrolyte and fluid balance, not elsewhere classified: Secondary | ICD-10-CM | POA: Diagnosis not present

## 2017-09-20 ENCOUNTER — Telehealth: Payer: Self-pay | Admitting: *Deleted

## 2017-09-20 ENCOUNTER — Encounter: Payer: Self-pay | Admitting: *Deleted

## 2017-09-20 ENCOUNTER — Other Ambulatory Visit: Payer: Self-pay | Admitting: *Deleted

## 2017-09-20 DIAGNOSIS — D631 Anemia in chronic kidney disease: Secondary | ICD-10-CM

## 2017-09-20 DIAGNOSIS — N189 Chronic kidney disease, unspecified: Principal | ICD-10-CM

## 2017-09-20 DIAGNOSIS — H6123 Impacted cerumen, bilateral: Secondary | ICD-10-CM | POA: Diagnosis not present

## 2017-09-20 NOTE — Telephone Encounter (Signed)
Daughter Britta Mccreedybarbara forsythe stated her mother saw her PCP last week and her calcium is low. She would like this repeated along with a hold clot at her next appt 09/28/17,for aranesp,  in case patient needs a transfusion. States her mother has been weak lately.

## 2017-09-20 NOTE — Telephone Encounter (Signed)
Please add CMET to labs.

## 2017-09-20 NOTE — Telephone Encounter (Signed)
Daughter Pamala Hurry calling to say patient's calcium is low and she wanted to know if a c-met will be drawn?

## 2017-09-21 DIAGNOSIS — Z961 Presence of intraocular lens: Secondary | ICD-10-CM | POA: Diagnosis not present

## 2017-09-21 DIAGNOSIS — H401131 Primary open-angle glaucoma, bilateral, mild stage: Secondary | ICD-10-CM | POA: Diagnosis not present

## 2017-09-28 ENCOUNTER — Other Ambulatory Visit: Payer: Self-pay | Admitting: Medical Oncology

## 2017-09-28 ENCOUNTER — Inpatient Hospital Stay (HOSPITAL_BASED_OUTPATIENT_CLINIC_OR_DEPARTMENT_OTHER): Payer: Medicare Other | Admitting: Oncology

## 2017-09-28 ENCOUNTER — Other Ambulatory Visit: Payer: Self-pay | Admitting: *Deleted

## 2017-09-28 ENCOUNTER — Inpatient Hospital Stay: Payer: Medicare Other

## 2017-09-28 ENCOUNTER — Telehealth: Payer: Self-pay | Admitting: Oncology

## 2017-09-28 VITALS — BP 138/49 | HR 98 | Temp 98.5°F | Resp 17 | Ht 61.5 in | Wt 136.9 lb

## 2017-09-28 DIAGNOSIS — D649 Anemia, unspecified: Secondary | ICD-10-CM

## 2017-09-28 DIAGNOSIS — D631 Anemia in chronic kidney disease: Secondary | ICD-10-CM

## 2017-09-28 DIAGNOSIS — D509 Iron deficiency anemia, unspecified: Secondary | ICD-10-CM

## 2017-09-28 DIAGNOSIS — Z79899 Other long term (current) drug therapy: Secondary | ICD-10-CM | POA: Diagnosis not present

## 2017-09-28 DIAGNOSIS — I129 Hypertensive chronic kidney disease with stage 1 through stage 4 chronic kidney disease, or unspecified chronic kidney disease: Secondary | ICD-10-CM

## 2017-09-28 DIAGNOSIS — Z7982 Long term (current) use of aspirin: Secondary | ICD-10-CM

## 2017-09-28 DIAGNOSIS — N189 Chronic kidney disease, unspecified: Secondary | ICD-10-CM | POA: Diagnosis not present

## 2017-09-28 LAB — CMP (CANCER CENTER ONLY)
ALT: 15 U/L (ref 0–44)
AST: 19 U/L (ref 15–41)
Albumin: 3.5 g/dL (ref 3.5–5.0)
Alkaline Phosphatase: 81 U/L (ref 38–126)
Anion gap: 9 (ref 5–15)
BUN: 17 mg/dL (ref 8–23)
CO2: 26 mmol/L (ref 22–32)
Calcium: 8.8 mg/dL — ABNORMAL LOW (ref 8.9–10.3)
Chloride: 102 mmol/L (ref 98–111)
Creatinine: 0.71 mg/dL (ref 0.44–1.00)
GFR, Est AFR Am: >60 mL/min
GFR, Estimated: >60 mL/min
Glucose, Bld: 100 mg/dL — ABNORMAL HIGH (ref 70–99)
Potassium: 4.6 mmol/L (ref 3.5–5.1)
Sodium: 137 mmol/L (ref 135–145)
Total Bilirubin: <0.2 mg/dL — ABNORMAL LOW (ref 0.3–1.2)
Total Protein: 5.9 g/dL — ABNORMAL LOW (ref 6.5–8.1)

## 2017-09-28 LAB — CBC WITH DIFFERENTIAL (CANCER CENTER ONLY)
Basophils Absolute: 0 10*3/uL (ref 0.0–0.1)
Basophils Relative: 0 %
Eosinophils Absolute: 0 10*3/uL (ref 0.0–0.5)
Eosinophils Relative: 1 %
HCT: 22.5 % — ABNORMAL LOW (ref 34.8–46.6)
Hemoglobin: 6.7 g/dL — CL (ref 11.6–15.9)
Lymphocytes Relative: 8 %
Lymphs Abs: 0.5 10*3/uL — ABNORMAL LOW (ref 0.9–3.3)
MCH: 26.4 pg (ref 25.1–34.0)
MCHC: 29.8 g/dL — ABNORMAL LOW (ref 31.5–36.0)
MCV: 88.6 fL (ref 79.5–101.0)
Monocytes Absolute: 0.8 10*3/uL (ref 0.1–0.9)
Monocytes Relative: 12 %
Neutro Abs: 4.9 10*3/uL (ref 1.5–6.5)
Neutrophils Relative %: 79 %
Platelet Count: 218 10*3/uL (ref 145–400)
RBC: 2.54 MIL/uL — ABNORMAL LOW (ref 3.70–5.45)
RDW: 15.5 % — ABNORMAL HIGH (ref 11.2–14.5)
WBC Count: 6.2 10*3/uL (ref 3.9–10.3)

## 2017-09-28 LAB — SAMPLE TO BLOOD BANK

## 2017-09-28 LAB — PREPARE RBC (CROSSMATCH)

## 2017-09-28 MED ORDER — DARBEPOETIN ALFA 300 MCG/0.6ML IJ SOSY
300.0000 ug | PREFILLED_SYRINGE | Freq: Once | INTRAMUSCULAR | Status: DC
  Administered 2017-09-28: 300 ug via SUBCUTANEOUS

## 2017-09-28 MED ORDER — DARBEPOETIN ALFA 300 MCG/0.6ML IJ SOSY
PREFILLED_SYRINGE | INTRAMUSCULAR | Status: AC
  Filled 2017-09-28: qty 0.6

## 2017-09-28 NOTE — Telephone Encounter (Signed)
Gave patients daughter avs and calendar of upcoming appts. Patients daughter is quite difficult to work with and is nasty. Patient was scheduled per 7/24 los. Patient scheduled with symptom management ok per melanie.

## 2017-09-28 NOTE — Addendum Note (Signed)
Addended by: Benjiman CoreSHADAD, Nuri Branca N on: 09/28/2017 04:28 PM   Modules accepted: Orders, SmartSet

## 2017-09-28 NOTE — Patient Instructions (Signed)

## 2017-09-28 NOTE — Progress Notes (Signed)
Hematology and Oncology Follow Up Visit  Lisa [name] [id number] [birth year] 84 y.o. 09/28/2017 1:21 PM Leighton Ruff, MDBarnes, [name], MD   Principle Diagnosis: 84 year old woman with anemia due to to iron deficiency, chronic renal insufficiency and chronic disease.  This was diagnosed in November 2018.  Prior Therapy: IV iron infusion completed in November 2018.  She is also status post post packed red cell transfusion  Current therapy: Aranesp 300 mcg every 3 weeks with supportive transfusion as needed.  Interim History: Lisa [name] today for a follow-up visit.  She received packed red cell transfusion on 08/16/2017 with improvement in her hemoglobin up to 8.2 on July 2.  Continues to receive Aranesp without any complications.  Has reported increased fatigue and tiredness on the last few weeks as well as a slight decrease in her appetite.  She denies any nausea, vomiting or abdominal distention.  He denies any falls or syncope.  She denies any lightheadedness or dizziness.  She still ambulates with the help of a walker without any falls.   She does not report any headaches, blurry vision, syncope or seizures.  She denies any dizziness or confusion.  She does not report any fevers, chills or sweats.  She does not report any chest pain, palpitation orthopnea.  She denies any dyspnea on exertion.  She does not report any nausea, vomiting abdominal pain.  She denies any constipation or diarrhea . She does not report any cough, wheezing or hemoptysis. She does not report any frequency urgency or hesitancy.  She does not report any arthralgias or myalgias.  She does not report lymphadenopathy or easy bruising.  She does not report any bleeding or clotting tendencies.  Remaining review of systems is negative.   Medications: I have reviewed the patient's current medications.  Current Outpatient Medications  Medication Sig Dispense Refill  . acetaminophen (TYLENOL) 325 MG tablet Take 2  tablets (650 mg total) by mouth every 6 (six) hours as needed for mild pain (or Fever >/= 101). 30 tablet 0  . aspirin EC 81 MG tablet Take 81 mg by mouth daily.    . calcium carbonate (OS-CAL) 600 MG TABS Take 600 mg by mouth daily.     . ferrous sulfate 325 (65 FE) MG tablet Take 325 mg by mouth daily with breakfast.    . loratadine (CLARITIN) 10 MG tablet Take 10 mg by mouth daily.    . Menthol, Topical Analgesic, (BIOFREEZE ROLL-ON) 4 % GEL Apply topically. Apply to right knee as needed    . Multiple Vitamins-Minerals (MULTIVITAMIN WITH MINERALS) tablet Take 1 tablet by mouth daily.    . pramipexole (MIRAPEX) 0.125 MG tablet Take 0.125 mg by mouth. Take one tablet at bedtime for restless leg syndrome    . primidone (MYSOLINE) 50 MG tablet Take 3 tablets (150 mg total) by mouth at bedtime. 270 tablet 0  . raloxifene (EVISTA) 60 MG tablet Take 60 mg by mouth at bedtime.     . timolol (TIMOPTIC) 0.5 % ophthalmic solution Place 1 drop into both eyes daily.   1  . VYTORIN 10-20 MG tablet Take 1 tablet by mouth daily.  0   No current facility-administered medications for this visit.      Allergies:  Allergies  Allergen Reactions  . Mycostatin [Nystatin] Swelling and Rash    Past Medical History, Surgical history, Social history, and Family History were reviewed and updated.  Physical Exam:  Blood pressure (!) 138/49, pulse 98, temperature 98.5 F (36.9  C), temperature source Oral, resp. rate 17, height 5' 1.5" (1.562 m), weight 136 lb 14.4 oz (62.1 kg), SpO2 99 %.    ECOG: 1 General appearance: Well-appearing woman appeared comfortable. Head: Atraumatic without normalities. Oral mucosa: No oral thrush or ulcers. Eyes: Pupils are equal and round reactive to light. Lymph nodes: No cervical, supraclavicular, or axillary adenopathy. Heart: Regular rate and rhythm without any murmurs or gallops.  Leg edema. Lung:  Clear without any rhonchi, wheezes or dullness to percussion. Abdomin:  Soft, without any shifting dullness or ascites. Musculoskeletal: No joint deformity or effusion. Skin: No skin rashes or lesions.  Lab Results: Lab Results  Component Value Date   WBC 6.5 09/06/2017   HGB 8.2 (L) 09/06/2017   HCT 25.4 (L) 09/06/2017   MCV 86.5 09/06/2017   PLT 237 09/06/2017     Chemistry      Component Value Date/Time   NA 137 08/16/2017 1511   NA 135 (A) 04/01/2016   K 4.4 08/16/2017 1511   CL 104 08/16/2017 1511   CO2 28 08/16/2017 1511   BUN 18 08/16/2017 1511   BUN 19 04/01/2016   CREATININE 1.08 08/16/2017 1511   GLU 102 04/01/2016      Component Value Date/Time   CALCIUM 8.4 08/16/2017 1511   ALKPHOS 70 08/16/2017 1511   AST 16 08/16/2017 1511   ALT 10 08/16/2017 1511   BILITOT <0.2 (L) 08/16/2017 1511       Impression and Plan:  84 year old woman with  1.  Anemia diagnosed in 2018: Her anemia is related to iron deficiency in addition to renal insufficiency and chronic disease.  MDS could also be a possibility but she declined a bone marrow biopsy.   She is currently on Aranesp 300 mcg every 4 weeks as started in May 2019 with intermittent intravenous IV iron infusion as well as back red cell transfusion.  Iron studies obtained September 06, 2017 started indicate iron deficiency and iron supplementation will be needed to improve her overall hemoglobin.  The plan is to proceed with IV iron infusion utilizing Feraheme for a total of 1000 mg in the immediate future in addition to packed red cell transfusion given her hemoglobin have drifted to a symptomatic level.  She will receive Aranesp every 3 to 4-weeks as well.   2.  Follow-up: 6 weeks to follow her progress.  15  minutes was spent with the patient face-to-face today.  More than 50% of time was dedicated to patient counseling, education and answering question regarding her diagnosis and future plan of care.      Zola Button, MD 7/24/20191:21 PM

## 2017-09-29 ENCOUNTER — Inpatient Hospital Stay: Payer: Medicare Other

## 2017-09-29 VITALS — BP 150/62 | HR 88 | Temp 99.0°F | Resp 16 | Ht 61.5 in | Wt 137.5 lb

## 2017-09-29 DIAGNOSIS — N189 Chronic kidney disease, unspecified: Principal | ICD-10-CM

## 2017-09-29 DIAGNOSIS — D509 Iron deficiency anemia, unspecified: Secondary | ICD-10-CM

## 2017-09-29 DIAGNOSIS — D649 Anemia, unspecified: Secondary | ICD-10-CM

## 2017-09-29 DIAGNOSIS — D631 Anemia in chronic kidney disease: Secondary | ICD-10-CM

## 2017-09-29 LAB — PREPARE RBC (CROSSMATCH)

## 2017-09-29 MED ORDER — SODIUM CHLORIDE 0.9 % IV SOLN
Freq: Once | INTRAVENOUS | Status: AC
  Administered 2017-09-29: 11:00:00 via INTRAVENOUS
  Filled 2017-09-29: qty 250

## 2017-09-29 MED ORDER — DIPHENHYDRAMINE HCL 25 MG PO CAPS
25.0000 mg | ORAL_CAPSULE | Freq: Once | ORAL | Status: AC
  Administered 2017-09-29: 25 mg via ORAL

## 2017-09-29 MED ORDER — SODIUM CHLORIDE 0.9% IV SOLUTION
250.0000 mL | Freq: Once | INTRAVENOUS | Status: AC
  Administered 2017-09-29: 250 mL via INTRAVENOUS
  Filled 2017-09-29: qty 250

## 2017-09-29 MED ORDER — DIPHENHYDRAMINE HCL 25 MG PO CAPS
ORAL_CAPSULE | ORAL | Status: AC
  Filled 2017-09-29: qty 1

## 2017-09-29 MED ORDER — ACETAMINOPHEN 325 MG PO TABS
ORAL_TABLET | ORAL | Status: AC
  Filled 2017-09-29: qty 2

## 2017-09-29 MED ORDER — SODIUM CHLORIDE 0.9 % IV SOLN
510.0000 mg | Freq: Once | INTRAVENOUS | Status: AC
  Administered 2017-09-29: 510 mg via INTRAVENOUS
  Filled 2017-09-29: qty 17

## 2017-09-29 MED ORDER — ACETAMINOPHEN 325 MG PO TABS
650.0000 mg | ORAL_TABLET | Freq: Once | ORAL | Status: AC
  Administered 2017-09-29: 650 mg via ORAL

## 2017-09-29 NOTE — Patient Instructions (Signed)
Ferumoxytol injection What is this medicine? FERUMOXYTOL is an iron complex. Iron is used to make healthy red blood cells, which carry oxygen and nutrients throughout the body. This medicine is used to treat iron deficiency anemia in people with chronic kidney disease. This medicine may be used for other purposes; ask your health care provider or pharmacist if you have questions. COMMON BRAND NAME(S): Feraheme What should I tell my health care provider before I take this medicine? They need to know if you have any of these conditions: -anemia not caused by low iron levels -high levels of iron in the blood -magnetic resonance imaging (MRI) test scheduled -an unusual or allergic reaction to iron, other medicines, foods, dyes, or preservatives -pregnant or trying to get pregnant -breast-feeding How should I use this medicine? This medicine is for injection into a vein. It is given by a health care professional in a hospital or clinic setting. Talk to your pediatrician regarding the use of this medicine in children. Special care may be needed. Overdosage: If you think you have taken too much of this medicine contact a poison control center or emergency room at once. NOTE: This medicine is only for you. Do not share this medicine with others. What if I miss a dose? It is important not to miss your dose. Call your doctor or health care professional if you are unable to keep an appointment. What may interact with this medicine? This medicine may interact with the following medications: -other iron products This list may not describe all possible interactions. Give your health care provider a list of all the medicines, herbs, non-prescription drugs, or dietary supplements you use. Also tell them if you smoke, drink alcohol, or use illegal drugs. Some items may interact with your medicine. What should I watch for while using this medicine? Visit your doctor or healthcare professional regularly. Tell  your doctor or healthcare professional if your symptoms do not start to get better or if they get worse. You may need blood work done while you are taking this medicine. You may need to follow a special diet. Talk to your doctor. Foods that contain iron include: whole grains/cereals, dried fruits, beans, or peas, leafy green vegetables, and organ meats (liver, kidney). What side effects may I notice from receiving this medicine? Side effects that you should report to your doctor or health care professional as soon as possible: -allergic reactions like skin rash, itching or hives, swelling of the face, lips, or tongue -breathing problems -changes in blood pressure -feeling faint or lightheaded, falls -fever or chills -flushing, sweating, or hot feelings -swelling of the ankles or feet Side effects that usually do not require medical attention (report to your doctor or health care professional if they continue or are bothersome): -diarrhea -headache -nausea, vomiting -stomach pain This list may not describe all possible side effects. Call your doctor for medical advice about side effects. You may report side effects to FDA at 1-800-FDA-1088. Where should I keep my medicine? This drug is given in a hospital or clinic and will not be stored at home. NOTE: This sheet is a summary. It may not cover all possible information. If you have questions about this medicine, talk to your doctor, pharmacist, or health care provider.  2018 Elsevier/Gold Standard (2015-03-27 12:41:49)    Blood Transfusion, Care After This sheet gives you information about how to care for yourself after your procedure. Your doctor may also give you more specific instructions. If you have problems or questions,   contact your doctor. Follow these instructions at home:  Take over-the-counter and prescription medicines only as told by your doctor.  Go back to your normal activities as told by your doctor.  Follow  instructions from your doctor about how to take care of the area where an IV tube was put into your vein (insertion site). Make sure you: ? Wash your hands with soap and water before you change your bandage (dressing). If there is no soap and water, use hand sanitizer. ? Change your bandage as told by your doctor.  Check your IV insertion site every day for signs of infection. Check for: ? More redness, swelling, or pain. ? More fluid or blood. ? Warmth. ? Pus or a bad smell. Contact a doctor if:  You have more redness, swelling, or pain around the IV insertion site..  You have more fluid or blood coming from the IV insertion site.  Your IV insertion site feels warm to the touch.  You have pus or a bad smell coming from the IV insertion site.  Your pee (urine) turns pink, red, or brown.  You feel weak after doing your normal activities. Get help right away if:  You have signs of a serious allergic or body defense (immune) system reaction, including: ? Itchiness. ? Hives. ? Trouble breathing. ? Anxiety. ? Pain in your chest or lower back. ? Fever, flushing, and chills. ? Fast pulse. ? Rash. ? Watery poop (diarrhea). ? Throwing up (vomiting). ? Dark pee. ? Serious headache. ? Dizziness. ? Stiff neck. ? Yellow color in your face or the white parts of your eyes (jaundice). Summary  After a blood transfusion, return to your normal activities as told by your doctor.  Every day, check for signs of infection where the IV tube was put into your vein.  Some signs of infection are warm skin, more redness and pain, more fluid or blood, and pus or a bad smell where the needle went in.  Contact your doctor if you feel weak or have any unusual symptoms. This information is not intended to replace advice given to you by your health care provider. Make sure you discuss any questions you have with your health care provider. Document Released: 03/15/2014 Document Revised: 10/17/2015  Document Reviewed: 10/17/2015 Elsevier Interactive Patient Education  2017 Elsevier Inc.  

## 2017-09-29 NOTE — Progress Notes (Signed)
Pt tolerated IV iron infusion well, stayed for 30 min period after.  VSS.  Starting 2 units blood afterwards.

## 2017-09-30 LAB — TYPE AND SCREEN
ABO/RH(D): A POS
Antibody Screen: NEGATIVE
Unit division: 0
Unit division: 0

## 2017-09-30 LAB — BPAM RBC
Blood Product Expiration Date: 201908092359
Blood Product Expiration Date: 201908112359
ISSUE DATE / TIME: 201907251126
ISSUE DATE / TIME: 201907251126
Unit Type and Rh: 6200
Unit Type and Rh: 6200

## 2017-10-06 ENCOUNTER — Inpatient Hospital Stay: Payer: Medicare Other | Attending: Oncology

## 2017-10-06 ENCOUNTER — Ambulatory Visit (HOSPITAL_BASED_OUTPATIENT_CLINIC_OR_DEPARTMENT_OTHER): Payer: Medicare Other | Admitting: Medical

## 2017-10-06 ENCOUNTER — Other Ambulatory Visit: Payer: Self-pay | Admitting: Medical

## 2017-10-06 VITALS — BP 142/55 | HR 93 | Temp 98.3°F | Resp 18

## 2017-10-06 DIAGNOSIS — N189 Chronic kidney disease, unspecified: Secondary | ICD-10-CM

## 2017-10-06 DIAGNOSIS — D631 Anemia in chronic kidney disease: Secondary | ICD-10-CM

## 2017-10-06 DIAGNOSIS — B379 Candidiasis, unspecified: Secondary | ICD-10-CM | POA: Diagnosis not present

## 2017-10-06 DIAGNOSIS — B372 Candidiasis of skin and nail: Secondary | ICD-10-CM

## 2017-10-06 DIAGNOSIS — Z87442 Personal history of urinary calculi: Secondary | ICD-10-CM | POA: Diagnosis not present

## 2017-10-06 DIAGNOSIS — I129 Hypertensive chronic kidney disease with stage 1 through stage 4 chronic kidney disease, or unspecified chronic kidney disease: Secondary | ICD-10-CM | POA: Insufficient documentation

## 2017-10-06 DIAGNOSIS — I2510 Atherosclerotic heart disease of native coronary artery without angina pectoris: Secondary | ICD-10-CM | POA: Diagnosis not present

## 2017-10-06 DIAGNOSIS — Z79899 Other long term (current) drug therapy: Secondary | ICD-10-CM | POA: Diagnosis not present

## 2017-10-06 DIAGNOSIS — I10 Essential (primary) hypertension: Secondary | ICD-10-CM | POA: Insufficient documentation

## 2017-10-06 DIAGNOSIS — E7800 Pure hypercholesterolemia, unspecified: Secondary | ICD-10-CM

## 2017-10-06 DIAGNOSIS — M129 Arthropathy, unspecified: Secondary | ICD-10-CM

## 2017-10-06 DIAGNOSIS — M810 Age-related osteoporosis without current pathological fracture: Secondary | ICD-10-CM

## 2017-10-06 DIAGNOSIS — D509 Iron deficiency anemia, unspecified: Secondary | ICD-10-CM

## 2017-10-06 DIAGNOSIS — D649 Anemia, unspecified: Secondary | ICD-10-CM

## 2017-10-06 MED ORDER — SODIUM CHLORIDE 0.9 % IV SOLN
Freq: Once | INTRAVENOUS | Status: AC
  Administered 2017-10-06: 16:00:00 via INTRAVENOUS
  Filled 2017-10-06: qty 250

## 2017-10-06 MED ORDER — FERUMOXYTOL INJECTION 510 MG/17 ML
510.0000 mg | Freq: Once | INTRAVENOUS | Status: AC
  Administered 2017-10-06: 510 mg via INTRAVENOUS
  Filled 2017-10-06: qty 17

## 2017-10-06 MED ORDER — NYSTATIN 100000 UNIT/GM EX POWD: Freq: Four times a day (QID) | CUTANEOUS | 1 refills | Status: DC

## 2017-10-06 NOTE — Patient Instructions (Signed)

## 2017-10-07 ENCOUNTER — Telehealth: Payer: Self-pay | Admitting: *Deleted

## 2017-10-07 MED ORDER — CLOTRIMAZOLE POWD: 1.0000 "application " | Freq: Four times a day (QID) | 1 refills | Status: DC

## 2017-10-07 NOTE — Progress Notes (Signed)
[facility] Progress Note   Lisa [name] [id number] [birth year] 84 y.o.  Lisa [name] is managed by Dr. Clelia Croft  Actively treated with chemotherapy/immunotherapy: no  Current Therapy: Aranesp 300 mcg every 4 weeks with supportive transfusion as needed.  Assessment: Plan:    Candidiasis of skin - Plan: Clotrimazole POWD  Anemia in chronic kidney disease, unspecified CKD stage   Cutaneous candidiasis: The patient was given a prescription for clotrimazole powder to use 4 times daily.  Anemia secondary to chronic kidney disease: Lisa [name] was dosed with Feraheme today.  She is scheduled to see Dr. Clelia Croft in follow-up on 11/09/2017.  Please see After Visit Summary for patient specific instructions.  Future Appointments  Date Time Provider Department Center  10/19/2017  3:00 PM [facility] 4 [facility] None  10/19/2017  3:30 PM [facility] FL[facility] None  11/09/2017  3:00 PM [facility] 5 [facility] None  11/09/2017  3:30 PM Benjiman Core, MD [facility] None  11/09/2017  3:45 PM [facility] FLUSH [facility] None  11/24/2017  3:00 PM Anson Fret, MD [facility] None  02/10/2018  3:00 PM [facility]  02/10/2018  3:45 PM Nickel, Carma Lair, NP [facility]    No orders of the defined types were placed in this encounter.      Subjective:   Patient ID:  Lisa [name] is a 84 y.o. (DOB [birth year]) female.  Chief Complaint: No chief complaint on file.   HPI Lisa [name] is a 84 year old female with a history of anemia secondary to chronic kidney disease and iron deficiency.  She is managed by Dr. Clelia Croft and is treated with Aranesp every 4 weeks with supportive transfusions as needed.  She was seen in infusion today while she was receiving IV Feraheme.  The patient reports having a history of shingles under her left breast with scarring.  She reports having increased erythema and itching at that site.  Medications: I  have reviewed the patient's current medications.  Allergies:  Allergies  Allergen Reactions  . Mycostatin [Nystatin] Swelling and Rash    Past Medical History:  Diagnosis Date  . Arthritis   . Carotid artery occlusion   . Cataract   . Glaucoma   . High cholesterol   . Hypertension   . Osteoporosis   . Pneumonia July 03, 2014  . Syncope 03/15/2016  . UTI (urinary tract infection)     Past Surgical History:  Procedure Laterality Date  . ABDOMINAL HYSTERECTOMY  1969  . APPENDECTOMY    . EYE SURGERY      Family History  Problem Relation Age of Onset  . Hypertension Mother   . Heart disease Mother   . Hyperlipidemia Brother   . Hypertension Brother     Social History   Socioeconomic History  . Marital status: Widowed    Spouse name: Not on file  . Number of children: 3  . Years of education: 84  . Highest education level: Not on file  Occupational History  . Occupation: retired Child psychotherapist  Social Needs  . Financial resource strain: Not on file  . Food insecurity:    Worry: Not on file    Inability: Not on file  . Transportation needs:    Medical: Not on file    Non-medical: Not on file  Tobacco Use  . Smoking status: Never Smoker  . Smokeless tobacco: Never Used  Substance and Sexual Activity  .  Alcohol use: No    Alcohol/week: 0.0 oz    Comment: rare  . Drug use: No  . Sexual activity: Never  Lifestyle  . Physical activity:    Days per week: Not on file    Minutes per session: Not on file  . Stress: Not on file  Relationships  . Social connections:    Talks on phone: Not on file    Gets together: Not on file    Attends religious service: Not on file    Active member of club or organization: Not on file    Attends meetings of clubs or organizations: Not on file    Relationship status: Not on file  . Intimate partner violence:    Fear of current or ex partner: Not on file    Emotionally abused: Not on file    Physically abused: Not on file      Forced sexual activity: Not on file  Other Topics Concern  . Not on file  Social History Narrative   Admitted to [facility] skill unit 03/10/16   Widowed   Never smoked   Alcohol none   Living Will, POA    Past Medical History, Surgical history, Social history, and Family history were reviewed and updated as appropriate.   Please see review of systems for further details on the patient's review from today.   Review of Systems:  Review of Systems  Skin: Positive for color change and rash.    Objective:   Physical Exam:  There were no vitals taken for this visit. ECOG: 1  Physical Exam  Constitutional: No distress.  Lisa [name] is an elderly female who appears to be in no acute distress.  She was seen in the infusion room today.  HENT:  Head: Normocephalic and atraumatic.  Neurological: She is alert.  Skin: Skin is warm and dry. Rash noted. She is not diaphoretic. There is erythema.  The skin immediately below the patient's left breast shows areas consistent with a prior episode of herpetic zoster.  The area is erythematous with multiple areas of thickened skin.  The skin is moist.  No exudate is noted.  Psychiatric: She has a normal mood and affect. Her behavior is normal. Judgment and thought content normal.    Lab Review:     Component Value Date/Time   NA 137 09/28/2017 1303   NA 135 (A) 04/01/2016   K 4.6 09/28/2017 1303   CL 102 09/28/2017 1303   CO2 26 09/28/2017 1303   GLUCOSE 100 (H) 09/28/2017 1303   BUN 17 09/28/2017 1303   BUN 19 04/01/2016   CREATININE 0.71 09/28/2017 1303   CALCIUM 8.8 (L) 09/28/2017 1303   PROT 5.9 (L) 09/28/2017 1303   ALBUMIN 3.5 09/28/2017 1303   AST 19 09/28/2017 1303   ALT 15 09/28/2017 1303   ALKPHOS 81 09/28/2017 1303   BILITOT <0.2 (L) 09/28/2017 1303   GFRNONAA >60 09/28/2017 1303   GFRAA >60 09/28/2017 1303       Component Value Date/Time   WBC 6.2 09/28/2017 1303   WBC 5.3 05/05/2017 1540   RBC 2.54  (L) 09/28/2017 1303   HGB 6.7 (LL) 09/28/2017 1303   HGB 9.0 (L) 03/03/2017 1456   HCT 22.5 (L) 09/28/2017 1303   HCT 27.4 (L) 03/03/2017 1456   PLT 218 09/28/2017 1303   PLT 258 03/03/2017 1456   MCV 88.6 09/28/2017 1303   MCV 99.5 03/03/2017 1456   MCH 26.4 09/28/2017 1303  MCHC 29.8 (L) 09/28/2017 1303   RDW 15.5 (H) 09/28/2017 1303   RDW 17.8 (H) 03/03/2017 1456   LYMPHSABS 0.5 (L) 09/28/2017 1303   LYMPHSABS 0.5 (L) 03/03/2017 1456   MONOABS 0.8 09/28/2017 1303   MONOABS 0.5 03/03/2017 1456   EOSABS 0.0 09/28/2017 1303   EOSABS 0.0 03/03/2017 1456   BASOSABS 0.0 09/28/2017 1303   BASOSABS 0.0 03/03/2017 1456   -------------------------------  Imaging from last 24 hours (if applicable):  Radiology interpretation: No results found.

## 2017-10-07 NOTE — Telephone Encounter (Signed)
"  Lisa MccreedyBarbara awaiting return call about mother's yeast infection.  I asked [facility] Pharmacist about mom's allergies.  [facility] provided the name of a cream 'Clotrimazole' used for yeast.  Calling to request new order sent to [facility] for my mom to begin today.  [facility] so I want to make sure she has what she needs before I leave town Sunday.  Call me [phone]) to let me know what is ordered and when order is sent to [facility]."

## 2017-10-12 NOTE — Telephone Encounter (Signed)
See Medication orders.

## 2017-10-19 ENCOUNTER — Inpatient Hospital Stay: Payer: Medicare Other

## 2017-10-19 ENCOUNTER — Telehealth: Payer: Self-pay | Admitting: *Deleted

## 2017-10-19 VITALS — BP 144/85 | HR 95 | Temp 98.8°F | Resp 20

## 2017-10-19 DIAGNOSIS — D649 Anemia, unspecified: Secondary | ICD-10-CM

## 2017-10-19 DIAGNOSIS — D631 Anemia in chronic kidney disease: Secondary | ICD-10-CM

## 2017-10-19 DIAGNOSIS — I129 Hypertensive chronic kidney disease with stage 1 through stage 4 chronic kidney disease, or unspecified chronic kidney disease: Secondary | ICD-10-CM | POA: Diagnosis not present

## 2017-10-19 DIAGNOSIS — N189 Chronic kidney disease, unspecified: Principal | ICD-10-CM

## 2017-10-19 DIAGNOSIS — D509 Iron deficiency anemia, unspecified: Secondary | ICD-10-CM

## 2017-10-19 LAB — CBC WITH DIFFERENTIAL (CANCER CENTER ONLY)
Basophils Absolute: 0 10*3/uL (ref 0.0–0.1)
Basophils Relative: 1 %
Eosinophils Absolute: 0 10*3/uL (ref 0.0–0.5)
Eosinophils Relative: 1 %
HCT: 30.4 % — ABNORMAL LOW (ref 34.8–46.6)
Hemoglobin: 9.8 g/dL — ABNORMAL LOW (ref 11.6–15.9)
Lymphocytes Relative: 11 %
Lymphs Abs: 0.6 10*3/uL — ABNORMAL LOW (ref 0.9–3.3)
MCH: 30.3 pg (ref 25.1–34.0)
MCHC: 32.3 g/dL (ref 31.5–36.0)
MCV: 93.8 fL (ref 79.5–101.0)
Monocytes Absolute: 0.6 10*3/uL (ref 0.1–0.9)
Monocytes Relative: 12 %
Neutro Abs: 4 10*3/uL (ref 1.5–6.5)
Neutrophils Relative %: 75 %
Platelet Count: 210 10*3/uL (ref 145–400)
RBC: 3.24 MIL/uL — ABNORMAL LOW (ref 3.70–5.45)
RDW: 21.6 % — ABNORMAL HIGH (ref 11.2–14.5)
WBC Count: 5.3 10*3/uL (ref 3.9–10.3)

## 2017-10-19 LAB — SAMPLE TO BLOOD BANK

## 2017-10-19 MED ORDER — DARBEPOETIN ALFA 300 MCG/0.6ML IJ SOSY
300.0000 ug | PREFILLED_SYRINGE | Freq: Once | INTRAMUSCULAR | Status: AC
  Administered 2017-10-19: 300 ug via SUBCUTANEOUS

## 2017-10-19 MED ORDER — DARBEPOETIN ALFA 300 MCG/0.6ML IJ SOSY
PREFILLED_SYRINGE | INTRAMUSCULAR | Status: AC
  Filled 2017-10-19: qty 0.6

## 2017-10-19 NOTE — Telephone Encounter (Signed)
Patient and daughter state patient saw a drop of blood on her depends last week.  Instructed her to call if she sees any more blood.

## 2017-10-19 NOTE — Patient Instructions (Signed)

## 2017-11-04 DIAGNOSIS — R7301 Impaired fasting glucose: Secondary | ICD-10-CM | POA: Diagnosis not present

## 2017-11-04 DIAGNOSIS — I10 Essential (primary) hypertension: Secondary | ICD-10-CM | POA: Diagnosis not present

## 2017-11-04 DIAGNOSIS — E559 Vitamin D deficiency, unspecified: Secondary | ICD-10-CM | POA: Diagnosis not present

## 2017-11-04 DIAGNOSIS — E782 Mixed hyperlipidemia: Secondary | ICD-10-CM | POA: Diagnosis not present

## 2017-11-09 ENCOUNTER — Inpatient Hospital Stay (HOSPITAL_BASED_OUTPATIENT_CLINIC_OR_DEPARTMENT_OTHER): Payer: Medicare Other | Admitting: Oncology

## 2017-11-09 ENCOUNTER — Inpatient Hospital Stay: Payer: Medicare Other

## 2017-11-09 ENCOUNTER — Inpatient Hospital Stay: Payer: Medicare Other | Attending: Oncology

## 2017-11-09 VITALS — BP 138/46 | HR 92 | Temp 98.4°F | Resp 18 | Ht 61.5 in | Wt 138.1 lb

## 2017-11-09 DIAGNOSIS — N189 Chronic kidney disease, unspecified: Secondary | ICD-10-CM | POA: Diagnosis not present

## 2017-11-09 DIAGNOSIS — D649 Anemia, unspecified: Secondary | ICD-10-CM

## 2017-11-09 DIAGNOSIS — D631 Anemia in chronic kidney disease: Secondary | ICD-10-CM

## 2017-11-09 DIAGNOSIS — D509 Iron deficiency anemia, unspecified: Secondary | ICD-10-CM | POA: Diagnosis not present

## 2017-11-09 DIAGNOSIS — I129 Hypertensive chronic kidney disease with stage 1 through stage 4 chronic kidney disease, or unspecified chronic kidney disease: Secondary | ICD-10-CM

## 2017-11-09 DIAGNOSIS — Z79899 Other long term (current) drug therapy: Secondary | ICD-10-CM | POA: Insufficient documentation

## 2017-11-09 DIAGNOSIS — Z7982 Long term (current) use of aspirin: Secondary | ICD-10-CM | POA: Insufficient documentation

## 2017-11-09 LAB — CBC WITH DIFFERENTIAL (CANCER CENTER ONLY)
Basophils Absolute: 0 10*3/uL (ref 0.0–0.1)
Basophils Relative: 0 %
Eosinophils Absolute: 0.1 10*3/uL (ref 0.0–0.5)
Eosinophils Relative: 1 %
HCT: 27.6 % — ABNORMAL LOW (ref 34.8–46.6)
Hemoglobin: 9 g/dL — ABNORMAL LOW (ref 11.6–15.9)
Lymphocytes Relative: 9 %
Lymphs Abs: 0.5 10*3/uL — ABNORMAL LOW (ref 0.9–3.3)
MCH: 32 pg (ref 25.1–34.0)
MCHC: 32.5 g/dL (ref 31.5–36.0)
MCV: 98.4 fL (ref 79.5–101.0)
Monocytes Absolute: 0.6 10*3/uL (ref 0.1–0.9)
Monocytes Relative: 10 %
Neutro Abs: 4.4 10*3/uL (ref 1.5–6.5)
Neutrophils Relative %: 80 %
Platelet Count: 226 10*3/uL (ref 145–400)
RBC: 2.8 MIL/uL — ABNORMAL LOW (ref 3.70–5.45)
RDW: 20.1 % — ABNORMAL HIGH (ref 11.2–14.5)
WBC Count: 5.6 10*3/uL (ref 3.9–10.3)

## 2017-11-09 LAB — SAMPLE TO BLOOD BANK

## 2017-11-09 MED ORDER — DARBEPOETIN ALFA 300 MCG/0.6ML IJ SOSY
300.0000 ug | PREFILLED_SYRINGE | Freq: Once | INTRAMUSCULAR | Status: AC
  Administered 2017-11-09: 300 ug via SUBCUTANEOUS

## 2017-11-09 MED ORDER — DARBEPOETIN ALFA 300 MCG/0.6ML IJ SOSY
PREFILLED_SYRINGE | INTRAMUSCULAR | Status: AC
Start: 2017-11-09 — End: ?
  Filled 2017-11-09: qty 0.6

## 2017-11-09 NOTE — Progress Notes (Signed)
Hematology and Oncology Follow Up Visit  Lisa [name] [id number] [birth year] 84 y.o. 11/09/2017 3:57 PM Lisa [name], MDBarnes, Lisa Mola, MD   Principle Diagnosis: 84 year old woman with multifactorial anemia due to to iron deficiency, chronic renal insufficiency and chronic disease diagnosed in November 2018.    Prior Therapy: IV iron infusion completed in November 2018.  She is also status post post packed red cell transfusion  Current therapy:   Aranesp 300 mcg every 3 weeks   Venous iron and packed red cell transfusion as needed.  Interim History: Lisa [name] is here for a follow-up visit.  Since the last visit, she received 2 units of packed red cell transfusion as well as intravenous iron and Aranesp.  This has improved her hemoglobin as high as 9.8 and she is asymptomatic.  She denies any complications related to this therapy or infusion of iron.  Her performance status and activity level although limited is unchanged.  She denies any falls or syncope.  Her appetite and performance status is unchanged.  She continues to ambulate with the help of a walker.  She does not report any headaches, blurry vision, syncope or seizures.  She denies any alteration in mental status.  She does not report any fevers, chills or sweats.  She does not report any chest pain, palpitation orthopnea.   She does not report any nausea, vomiting or abdominal distention.  She denies any change in her bowel habits. She does not report any cough, wheezing or hemoptysis. She does not report any frequency urgency or hesitancy.  She does not report any bone pain or pathological fractures.  She does not report lymphadenopathy or easy bruising.  She does not report any skin rashes or lesions.  Remaining review of systems is negative.   Medications: I have reviewed the patient's current medications.  Current Outpatient Medications  Medication Sig Dispense Refill  . acetaminophen (TYLENOL) 325 MG tablet Take 2  tablets (650 mg total) by mouth every 6 (six) hours as needed for mild pain (or Fever >/= 101). 30 tablet 0  . aspirin EC 81 MG tablet Take 81 mg by mouth daily.    . calcium carbonate (OS-CAL) 600 MG TABS Take 600 mg by mouth daily.     . Clotrimazole POWD 1 application by Does not apply route 4 (four) times daily. 100 g 1  . ferrous sulfate 325 (65 FE) MG tablet Take 325 mg by mouth daily with breakfast.    . loratadine (CLARITIN) 10 MG tablet Take 10 mg by mouth daily.    . Menthol, Topical Analgesic, (BIOFREEZE ROLL-ON) 4 % GEL Apply topically. Apply to right knee as needed    . Multiple Vitamins-Minerals (MULTIVITAMIN WITH MINERALS) tablet Take 1 tablet by mouth daily.    Marland Kitchen nystatin (MYCOSTATIN/NYSTOP) powder Apply topically 4 (four) times daily. 60 g 1  . oxybutynin (DITROPAN) 5 MG tablet Take 2.5 mg by mouth 2 (two) times daily.  2  . pramipexole (MIRAPEX) 0.125 MG tablet Take 0.25 mg by mouth at bedtime. Take one tablet at bedtime for restless leg syndrome     . primidone (MYSOLINE) 50 MG tablet Take 3 tablets (150 mg total) by mouth at bedtime. 270 tablet 0  . raloxifene (EVISTA) 60 MG tablet Take 60 mg by mouth at bedtime.     . timolol (TIMOPTIC) 0.5 % ophthalmic solution Place 1 drop into both eyes daily.   1  . VYTORIN 10-20 MG tablet Take 1 tablet by mouth daily.  0   No current facility-administered medications for this visit.    Facility-Administered Medications Ordered in Other Visits  Medication Dose Route Frequency Provider Last Rate Last Dose  . Darbepoetin Alfa (ARANESP) injection 300 mcg  300 mcg Subcutaneous Once Wyatt Portela, MD         Allergies:  Allergies  Allergen Reactions  . Mycostatin [Nystatin] Swelling and Rash    Past Medical History, Surgical history, Social history, and Family History were reviewed and updated.  Physical Exam:  Blood pressure (!) 138/46, pulse 92, temperature 98.4 F (36.9 C), temperature source Oral, resp. rate 18, height 5'  1.5" (1.562 m), weight 138 lb 1.6 oz (62.6 kg), SpO2 97 %.    ECOG: 1   General appearance: Comfortable appearing without any discomfort Head: Normocephalic without any trauma Oropharynx: Mucous membranes are moist and pink without any thrush or ulcers. Eyes: Pupils are equal and round reactive to light. Lymph nodes: No cervical, supraclavicular, inguinal or axillary lymphadenopathy.   Heart:regular rate and rhythm.  S1 and S2 without leg edema. Lung: Clear without any rhonchi or wheezes.  No dullness to percussion. Abdomin: Soft, nontender, nondistended with good bowel sounds.  No hepatosplenomegaly. Musculoskeletal: No joint deformity or effusion.  Full range of motion noted. Neurological: No deficits noted on motor, sensory and deep tendon reflex exam. Skin: No petechial rash or dryness.  Appeared moist.    Lab Results: Lab Results  Component Value Date   WBC 5.6 11/09/2017   HGB 9.0 (L) 11/09/2017   HCT 27.6 (L) 11/09/2017   MCV 98.4 11/09/2017   PLT 226 11/09/2017     Chemistry      Component Value Date/Time   NA 137 09/28/2017 1303   NA 135 (A) 04/01/2016   K 4.6 09/28/2017 1303   CL 102 09/28/2017 1303   CO2 26 09/28/2017 1303   BUN 17 09/28/2017 1303   BUN 19 04/01/2016   CREATININE 0.71 09/28/2017 1303   GLU 102 04/01/2016      Component Value Date/Time   CALCIUM 8.8 (L) 09/28/2017 1303   ALKPHOS 81 09/28/2017 1303   AST 19 09/28/2017 1303   ALT 15 09/28/2017 1303   BILITOT <0.2 (L) 09/28/2017 1303       Impression and Plan:  84 year old woman with  1.  Anemia: Related to chronic renal insufficiency with a element of chronic disease as well as iron deficiency.   She continues to receive Aranesp at 300 mcg every 3 weeks to keep her hemoglobin above 11.  She will also receive supportive transfusion as needed.  Her hemoglobin is 9.0 and does not require any transfusion.  The differential diagnosis was also reviewed again and management options were  discussed extensively with the patient and her daughter.  Bone marrow biopsy would be ideal to diagnose whether she has evidence of myelodysplastic syndrome but she has declined.  She is not an ideal candidate for any aggressive therapy beyond the current measures.  For the time being we will continue to monitor her periodically and receive Aranesp every 3 weeks with transfusions as needed.  2.  Iron deficiency: She received intravenous iron without complications.  Her iron studies will be repeated in 3 weeks to ensure adequate iron stores.  3.  Follow-up: 9 weeks to follow her progress.  15  minutes was spent with the patient face-to-face today.  More than 50% of time was dedicated to reviewing the differential diagnosis, treatment options, reviewing laboratory data and managing her  future plan of care.      Zola Button, MD 9/4/20193:57 PM

## 2017-11-09 NOTE — Progress Notes (Signed)
Pt.. tolerated injection well, No further problems or concerns noted. 

## 2017-11-09 NOTE — Patient Instructions (Signed)

## 2017-11-10 ENCOUNTER — Telehealth: Payer: Self-pay

## 2017-11-10 NOTE — Telephone Encounter (Signed)
Printed avs and calender of upcoming appointment. Per 9/4 los 

## 2017-11-22 DIAGNOSIS — I739 Peripheral vascular disease, unspecified: Secondary | ICD-10-CM | POA: Diagnosis not present

## 2017-11-22 DIAGNOSIS — B351 Tinea unguium: Secondary | ICD-10-CM | POA: Diagnosis not present

## 2017-11-24 ENCOUNTER — Ambulatory Visit: Payer: Medicare Other | Admitting: Neurology

## 2017-11-24 ENCOUNTER — Encounter: Payer: Self-pay | Admitting: Neurology

## 2017-11-24 VITALS — BP 112/61 | HR 96 | Ht 61.0 in | Wt 139.0 lb

## 2017-11-24 DIAGNOSIS — G250 Essential tremor: Secondary | ICD-10-CM | POA: Diagnosis not present

## 2017-11-24 DIAGNOSIS — G5603 Carpal tunnel syndrome, bilateral upper limbs: Secondary | ICD-10-CM | POA: Diagnosis not present

## 2017-11-24 MED ORDER — PRIMIDONE 50 MG PO TABS: 150.0000 mg | ORAL_TABLET | Freq: Every day | ORAL | 4 refills | Status: DC

## 2017-11-24 NOTE — Progress Notes (Signed)
[facility]    Provider:  Dr Lucia Gaskins Referring Provider: [name], MD Primary Care Physician:  [name], MD  CC: tremor  Interval history: propranolol is making her tired, will discontinue. Her hands are still better. But her chin tremor is ongoing. She is seeing orthopaedics for injections into CTS. She wears braces at night. She cannot tolerate more primidone. She would not like to try any medications for tremor, today we discussed Carpal Tunnel syndrome a new problem, etiology, diagnosis and therapy. Discussed emg/ncs  Interval history 10/09/2015: She is on 3 pills of primidone at night. The hand tremor is better.  The chin tremor is still bad. Some sedation and feeling sluggish likely die to the primidone, discussed, cannot increase here. Discussed other medications and possibly a very low dose b-blocker. She is sleeping later in the morning. She goes to be at 11pm and 1130. She takes the pills at 10pm. She wakes up at 830. She feels sluggish. Will ask her to take primidone a little earlier. Cannot increase at this time. Discussed other options, decided on propranolol very low dose and discussed side effects at length. Continue primidone at current dose.    Interval history: She is eating more and better. She is on more of a routine at the nursing home. She is on a whole pill of primidone at night. She does not feel that the primidone is helping her however she is only on 50 mg at night. Discussed that we may have to increase this medication more than not to see results. Propranolol the past helped but was discontinued due to side effects. Discussed with daughter and patient again.She denies any side effects at all to the primidone. Let daughter know that she doesn't have to wait several months if the medication is not working, they can call me and I'm happy to increase it. We'll plan on increasing the primidone very slowly to 100 mg at night. They  understand that we may have to go up much more than that and that this may be limited by side effects. Can cause sedation in the middle of the night, advised patient to keep lights on, and if she has any side effects or any increased sedation that she should go back to the last dose of the primidone that was tolerated. Discussed other medications we could try including Neurontin, Keppra, Topamax for essential tremor. Propranolol and primidone are the most effective however Neurontin often works as well.  HPI: Lisa [name] is a 84 y.o. female here as a referral from Dr. Zachery Dauer for tremor. PMHx of CAP, asymptomatic carotid artery disease, Chronic venous insufficiency, Acute resp failure with hypoxia, HTN, dizziness, glucose intolerance, osteoporosis, benign essential tremor, anemia, allergic rhinitis,, HLD, glaucoma. She has had a tremor for years. Started 10 years ago in the right hand and then to the left hand then to the face. Worsening over the last year. No pain just very annoying. Continuous. It is embarrassing. She was on a b-blocker and that was discontinued. She was dizzy on the b-blocker with hypotension. The beta blocker help the tremor, Worsened since stopping the b-blocker. The tremor is continuous. Her mohter had a tremor as well as her brother. Slowly progressive. Daughter is with her and also provides information.  Reviewed notes, labs and imaging from outside physicians, which showed:  Cbc with anemia (9.5/28.1), BMP unremarkable, B12 745, TSH 3.120  MRI of the brain 2005: MRI BRAIN WO/W CONTRAST 15 cc IV Omniscan. No comparison. There  is moderate cortical atrophy. There are scattered small subcortical and periventricular white matter hyperintensities as well as a 1 cm hyperintensity in the left inferior temporal lobe. 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 There also is some minimal patchy hyperintensity in the pons bilaterally compatible with chronic ischemic change. Diffusion  weighted imaging is negative for acute infarct. The enhancement pattern is normal and there is no mass. IMPRESSION Chronic small vessel ischemic changes. No acute abnormalities  Reviewed records from [facility] physicians. She also has dyslipidemia and impaired fasting glucose. Patient (solving [place]. Spent a good move for her. She enjoys activities in the company. She the dining hall twice a day is probably eating better but is also exercising more. She has a history of benign essential tremor primarily in her jaw. She was on a beta blocker without discontinued due to low blood pressure and becoming dizzy. He frequently clears her throat has a lot of sneezing or blowing her nose.   Review of Systems: Patient complains of symptoms per HPI as well as the following symptoms: Tremor, hearing loss. Pertinent negatives per HPI. All others negative.  Social History   Socioeconomic History  . Marital status: Widowed    Spouse name: Not on file  . Number of children: 3  . Years of education: 64  . Highest education level: Bachelor's degree (e.g., BA, AB, BS)  Occupational History  . Occupation: retired Child psychotherapist  Social Needs  . Financial resource strain: Not on file  . Food insecurity:    Worry: Not on file    Inability: Not on file  . Transportation needs:    Medical: Not on file    Non-medical: Not on file  Tobacco Use  . Smoking status: Never Smoker  . Smokeless tobacco: Never Used  Substance and Sexual Activity  . Alcohol use: Not Currently    Alcohol/week: 0.0 standard drinks    Comment: rare; about 3 sips every 2-3 months   . Drug use: No  . Sexual activity: Never  Lifestyle  . Physical activity:    Days per week: Not on file    Minutes per session: Not on file  . Stress: Not on file  Relationships  . Social connections:    Talks on phone: Not on file    Gets together: Not on file    Attends religious service: Not on file    Active member of club or organization:  Not on file    Attends meetings of clubs or organizations: Not on file    Relationship status: Not on file  . Intimate partner violence:    Fear of current or ex partner: Not on file    Emotionally abused: Not on file    Physically abused: Not on file    Forced sexual activity: Not on file  Other Topics Concern  . Not on file  Social History Narrative   Admitted to [facility] [place] skill unit 03/10/16   Currently lives at [facility] independent living    Widowed   Never smoked   Living Will, [place]    Family History  Problem Relation Age of Onset  . Hypertension Mother   . Heart disease Mother   . Hyperlipidemia Brother   . Hypertension Brother     Past Medical History:  Diagnosis Date  . Anemia    sees Dr. Clelia Croft @ [facility]  . Arthritis   . Carotid artery occlusion   . Cataract   . Glaucoma   .  High cholesterol   . Hypertension   . Osteoporosis   . Pneumonia July 03, 2014  . Syncope 03/15/2016  . UTI (urinary tract infection)     Past Surgical History:  Procedure Laterality Date  . ABDOMINAL HYSTERECTOMY  1969  . APPENDECTOMY    . EYE SURGERY      Current Outpatient Medications  Medication Sig Dispense Refill  . acetaminophen (TYLENOL) 500 MG tablet Take 500-1,000 mg by mouth at bedtime.    Marland Kitchen. aspirin EC 81 MG tablet Take 81 mg by mouth daily.    . calcium carbonate (OS-CAL) 600 MG TABS Take 600 mg by mouth daily.     . ferrous sulfate 325 (65 FE) MG tablet Take 325 mg by mouth daily with breakfast.    . Multiple Vitamins-Minerals (MULTIVITAMIN WITH MINERALS) tablet Take 1 tablet by mouth daily.    Marland Kitchen. OVER THE COUNTER MEDICATION Apply topically as needed. Cream for arthritic pain. Patient unable to recall the name at this time.    Marland Kitchen. oxybutynin (DITROPAN) 5 MG tablet Take 2.5 mg by mouth 2 (two) times daily.  2  . pramipexole (MIRAPEX) 0.25 MG tablet Take 0.25 mg by mouth at bedtime.    . primidone (MYSOLINE) 50 MG tablet Take 3  tablets (150 mg total) by mouth at bedtime. 270 tablet 0  . raloxifene (EVISTA) 60 MG tablet Take 60 mg by mouth at bedtime.     . timolol (TIMOPTIC) 0.5 % ophthalmic solution Place 1 drop into both eyes daily.   1  . UNABLE TO FIND Med Name: Aranesp infusions [facility]    . VYTORIN 10-20 MG tablet Take 1 tablet by mouth daily.  0  . loratadine (CLARITIN) 10 MG tablet Take 10 mg by mouth daily as needed.      No current facility-administered medications for this visit.     Allergies as of 11/24/2017 - Review Complete 11/24/2017  Allergen Reaction Noted  . Mycostatin [nystatin] Swelling and Rash 11/28/2011    Vitals: BP 112/61 (BP Location: Right Arm, Patient Position: Sitting)   Pulse 96   Ht 5\' 1"  (1.549 m)   Wt 139 lb (63 kg)   BMI 26.26 kg/m  Last Weight:  Wt Readings from Last 1 Encounters:  11/24/17 139 lb (63 kg)   Last Height:   Ht Readings from Last 1 Encounters:  11/24/17 5\' 1"  (1.549 m)     Assessment/Plan: Patient is a lovely 84 year old female here with familial essential tremor.   - continue primidone, cannot increase - will hold off on any med changes, can try to increase primidone but worry about falls - patient, daughter and I discussed medical management for essential tremors and that all the medications have risk for sedation and falls. The hands are much better. The mouth tremor does bother and but she feels self conscious. We can try and increase primidone at a later time  - she is waking up hourly to use the bathroom, recommend discussion with pcp possibly for medication as this is a fall risk. Discussed limiting fluids after 5pm   - she is not drinking enough fluids, recent UTI. Discussed hydration.  - For CTS we decided to arrange for Physical Therapy at [facility] [place] to see if they can help her with her Carpal Tunnel Syndrome with wrist braces or exercises.   Orders Placed This Encounter    Procedures  . Ambulatory referral to Physical Therapy  Lisa Dean, MD  [facility] [address] [name], [place] [address]  Phone [phone] Fax [phone]  A total of 25 minutes was spent face-to-face with this patient. Over half this time was spent on counseling patient on the  1. Essential tremor   2. Bilateral carpal tunnel syndrome     diagnosis and different diagnostic and therapeutic options available.

## 2017-11-24 NOTE — Patient Instructions (Addendum)
Electromyoneurogram Electromyoneurogram is a test to check how well your muscles and nerves are working. This procedure includes the combined use of electromyogram (EMG) and nerve conduction study (NCS). EMG is used to look for muscular disorders. NCS, which is also called electroneurogram, measures how well your nerves are controlling your muscles. The procedures are usually performed together to check if your muscles and nerves are healthy. If the reaction to testing is abnormal, this can indicate disease or injury, such as peripheral nerve damage. Tell a health care provider about:  Any allergies you have.  All medicines you are taking, including vitamins, herbs, eye drops, creams, and over-the-counter medicines.  Any problems you or family members have had with anesthetic medicines.  Any blood disorders you have.  Any surgeries you have had.  Any medical conditions you have.  Any pacemaker you have. What are the risks? Generally, this is a safe procedure. However, problems may occur, including:  Infection where the electrodes were inserted.  Bleeding.  What happens before the procedure?  Ask your health care provider about: ? Changing or stopping your regular medicines. This is especially important if you are taking diabetes medicines or blood thinners. ? Taking medicines such as aspirin and ibuprofen. These medicines can thin your blood. Do not take these medicines before your procedure if your health care provider instructs you not to.  Your health care provider may ask you to avoid: ? Caffeine, such as coffee and tea. ? Nicotine. This includes cigarettes and anything with tobacco.  Do not use lotions or creams on the same day that you will be having the procedure. What happens during the procedure? For EMG:  Your health care provider will ask you to stay in a position so that he or she can access the muscle that will be studied. You may be standing, sitting down, or  lying down.  You may be given a medicine that numbs the area (local anesthetic).  A very thin needle that has an electrode on it will be inserted into your muscle.  Another small electrode will be placed on your skin near the muscle.  Your health care provider will ask you to continue to remain still.  The electrodes will send a signal that tells about the electrical activity of your muscles. You may see this on a monitor or hear it in the room.  After your muscles have been studied at rest, your health care provider will ask you to contract or flex your muscles. The electrodes will send a signal that tells about the electrical activity of your muscles.  Your health care provider will remove the electrodes and the electrode needles when the procedure is finished. The procedure may vary among health care providers and hospitals. For NCS:  An electrode that records your nerve activity (recording electrode) will be placed on your skin by the muscle that is being studied.  An electrode that is used as a reference (reference electrode) will be placed near the recording electrode.  A paste or gel will be applied to your skin between the recording electrode and the reference electrode.  Your nerve will be stimulated with a mild shock. Your health care provider will measure how much time it takes for your muscle to react.  Your health care provider will remove the electrodes and the gel when the procedure is finished. The procedure may vary among health care providers and hospitals. What happens after the procedure?  It is your responsibility to obtain your test   results. Ask your health care provider or the department performing the test when and how you will get your results.  Your health care provider may: ? Give you medicines for any pain. ? Monitor the insertion sites to make sure that they stop bleeding. This information is not intended to replace advice given to you by your health  care provider. Make sure you discuss any questions you have with your health care provider. Document Released: 06/25/2004 Document Revised: 07/31/2015 Document Reviewed: 04/15/2014 Elsevier Interactive Patient Education  2018 Elsevier Inc.   Carpal Tunnel Syndrome Carpal tunnel syndrome is a condition that causes pain in your hand and arm. The carpal tunnel is a narrow area that is on the palm side of your wrist. Repeated wrist motion or certain diseases may cause swelling in the tunnel. This swelling can pinch the main nerve in the wrist (median nerve). Follow these instructions at home: If you have a splint:  Wear it as told by your doctor. Remove it only as told by your doctor.  Loosen the splint if your fingers: ? Become numb and tingle. ? Turn blue and cold.  Keep the splint clean and dry. General instructions  Take over-the-counter and prescription medicines only as told by your doctor.  Rest your wrist from any activity that may be causing your pain. If needed, talk to your employer about changes that can be made in your work, such as getting a wrist pad to use while typing.  If directed, apply ice to the painful area: ? Put ice in a plastic bag. ? Place a towel between your skin and the bag. ? Leave the ice on for 20 minutes, 2-3 times per day.  Keep all follow-up visits as told by your doctor. This is important.  Do any exercises as told by your doctor, physical therapist, or occupational therapist. Contact a doctor if:  You have new symptoms.  Medicine does not help your pain.  Your symptoms get worse. This information is not intended to replace advice given to you by your health care provider. Make sure you discuss any questions you have with your health care provider. Document Released: 02/11/2011 Document Revised: 07/31/2015 Document Reviewed: 07/10/2014 Elsevier Interactive Patient Education  [employer]2018 [employer] Inc.

## 2017-11-30 ENCOUNTER — Inpatient Hospital Stay: Payer: Medicare Other

## 2017-11-30 ENCOUNTER — Telehealth: Payer: Self-pay | Admitting: Oncology

## 2017-11-30 ENCOUNTER — Other Ambulatory Visit: Payer: Self-pay | Admitting: Oncology

## 2017-11-30 VITALS — BP 132/48 | HR 95 | Temp 98.9°F | Resp 18

## 2017-11-30 DIAGNOSIS — N189 Chronic kidney disease, unspecified: Secondary | ICD-10-CM | POA: Diagnosis not present

## 2017-11-30 DIAGNOSIS — D631 Anemia in chronic kidney disease: Secondary | ICD-10-CM

## 2017-11-30 DIAGNOSIS — D509 Iron deficiency anemia, unspecified: Secondary | ICD-10-CM

## 2017-11-30 DIAGNOSIS — I129 Hypertensive chronic kidney disease with stage 1 through stage 4 chronic kidney disease, or unspecified chronic kidney disease: Secondary | ICD-10-CM | POA: Diagnosis not present

## 2017-11-30 DIAGNOSIS — Z7982 Long term (current) use of aspirin: Secondary | ICD-10-CM | POA: Diagnosis not present

## 2017-11-30 DIAGNOSIS — Z79899 Other long term (current) drug therapy: Secondary | ICD-10-CM | POA: Diagnosis not present

## 2017-11-30 DIAGNOSIS — D649 Anemia, unspecified: Secondary | ICD-10-CM

## 2017-11-30 LAB — CMP (CANCER CENTER ONLY)
ALT: 16 U/L (ref 0–44)
AST: 17 U/L (ref 15–41)
Albumin: 3.2 g/dL — ABNORMAL LOW (ref 3.5–5.0)
Alkaline Phosphatase: 74 U/L (ref 38–126)
Anion gap: 5 (ref 5–15)
BUN: 21 mg/dL (ref 8–23)
CO2: 30 mmol/L (ref 22–32)
Calcium: 8.6 mg/dL — ABNORMAL LOW (ref 8.9–10.3)
Chloride: 103 mmol/L (ref 98–111)
Creatinine: 0.88 mg/dL (ref 0.44–1.00)
GFR, Est AFR Am: >60 mL/min (ref >60)
GFR, Estimated: 55 mL/min — ABNORMAL LOW (ref >60)
Glucose, Bld: 112 mg/dL — ABNORMAL HIGH (ref 70–99)
Potassium: 4.4 mmol/L (ref 3.5–5.1)
Sodium: 138 mmol/L (ref 135–145)
Total Bilirubin: <0.2 mg/dL — ABNORMAL LOW (ref 0.3–1.2)
Total Protein: 5.5 g/dL — ABNORMAL LOW (ref 6.5–8.1)

## 2017-11-30 LAB — CBC WITH DIFFERENTIAL (CANCER CENTER ONLY)
Basophils Absolute: 0 10*3/uL (ref 0.0–0.1)
Basophils Relative: 0 %
Eosinophils Absolute: 0 10*3/uL (ref 0.0–0.5)
Eosinophils Relative: 1 %
HCT: 24.3 % — ABNORMAL LOW (ref 34.8–46.6)
Hemoglobin: 7.8 g/dL — ABNORMAL LOW (ref 11.6–15.9)
Lymphocytes Relative: 8 %
Lymphs Abs: 0.4 10*3/uL — ABNORMAL LOW (ref 0.9–3.3)
MCH: 30.3 pg (ref 25.1–34.0)
MCHC: 32.2 g/dL (ref 31.5–36.0)
MCV: 93.8 fL (ref 79.5–101.0)
Monocytes Absolute: 0.5 10*3/uL (ref 0.1–0.9)
Monocytes Relative: 10 %
Neutro Abs: 4.1 10*3/uL (ref 1.5–6.5)
Neutrophils Relative %: 81 %
Platelet Count: 228 10*3/uL (ref 145–400)
RBC: 2.59 MIL/uL — ABNORMAL LOW (ref 3.70–5.45)
RDW: 16.7 % — ABNORMAL HIGH (ref 11.2–14.5)
WBC Count: 5.1 10*3/uL (ref 3.9–10.3)

## 2017-11-30 LAB — SAMPLE TO BLOOD BANK

## 2017-11-30 LAB — FERRITIN: Ferritin: 12 ng/mL (ref 11–307)

## 2017-11-30 LAB — IRON AND TIBC
Iron: 49 ug/dL (ref 41–142)
Saturation Ratios: 20 % — ABNORMAL LOW (ref 21–57)
TIBC: 251 ug/dL (ref 236–444)
UIBC: 202 ug/dL

## 2017-11-30 MED ORDER — DARBEPOETIN ALFA 300 MCG/0.6ML IJ SOSY
300.0000 ug | PREFILLED_SYRINGE | Freq: Once | INTRAMUSCULAR | Status: AC
  Administered 2017-11-30: 300 ug via SUBCUTANEOUS

## 2017-11-30 MED ORDER — DARBEPOETIN ALFA 300 MCG/0.6ML IJ SOSY
PREFILLED_SYRINGE | INTRAMUSCULAR | Status: AC
  Filled 2017-11-30: qty 0.6

## 2017-11-30 NOTE — Telephone Encounter (Signed)
Scheduled apt per 9/25 sch message - pt daughter is aware of appt date and tim e.

## 2017-11-30 NOTE — Progress Notes (Signed)
Pt here today for lab and injection appointment. TC to Dr. Clelia Croft Pt's HGB 7.8 per Dr.[name] Pt to receive aranesp injection today and 2 units of blood on Friday.

## 2017-12-02 ENCOUNTER — Other Ambulatory Visit: Payer: Self-pay

## 2017-12-02 ENCOUNTER — Other Ambulatory Visit: Payer: Self-pay | Admitting: *Deleted

## 2017-12-02 ENCOUNTER — Inpatient Hospital Stay: Payer: Medicare Other

## 2017-12-02 VITALS — BP 149/69 | HR 89 | Temp 98.8°F | Resp 18

## 2017-12-02 DIAGNOSIS — N189 Chronic kidney disease, unspecified: Secondary | ICD-10-CM

## 2017-12-02 DIAGNOSIS — D631 Anemia in chronic kidney disease: Secondary | ICD-10-CM

## 2017-12-02 DIAGNOSIS — I129 Hypertensive chronic kidney disease with stage 1 through stage 4 chronic kidney disease, or unspecified chronic kidney disease: Secondary | ICD-10-CM | POA: Diagnosis not present

## 2017-12-02 DIAGNOSIS — D649 Anemia, unspecified: Secondary | ICD-10-CM

## 2017-12-02 DIAGNOSIS — Z79899 Other long term (current) drug therapy: Secondary | ICD-10-CM | POA: Diagnosis not present

## 2017-12-02 DIAGNOSIS — Z7982 Long term (current) use of aspirin: Secondary | ICD-10-CM | POA: Diagnosis not present

## 2017-12-02 DIAGNOSIS — D509 Iron deficiency anemia, unspecified: Secondary | ICD-10-CM

## 2017-12-02 LAB — PREPARE RBC (CROSSMATCH)

## 2017-12-02 MED ORDER — ACETAMINOPHEN 325 MG PO TABS
ORAL_TABLET | ORAL | Status: AC
  Filled 2017-12-02: qty 2

## 2017-12-02 MED ORDER — ACETAMINOPHEN 325 MG PO TABS
650.0000 mg | ORAL_TABLET | Freq: Once | ORAL | Status: AC
  Administered 2017-12-02: 650 mg via ORAL

## 2017-12-02 MED ORDER — SODIUM CHLORIDE 0.9 % IV SOLN
510.0000 mg | Freq: Once | INTRAVENOUS | Status: AC
  Administered 2017-12-02: 510 mg via INTRAVENOUS
  Filled 2017-12-02: qty 17

## 2017-12-02 MED ORDER — SODIUM CHLORIDE 0.9 % IV SOLN
INTRAVENOUS | Status: DC
  Administered 2017-12-02 (×2): via INTRAVENOUS
  Filled 2017-12-02: qty 250

## 2017-12-02 MED ORDER — SODIUM CHLORIDE 0.9 % IV SOLN
Freq: Once | INTRAVENOUS | Status: AC
  Administered 2017-12-02: 17:00:00 via INTRAVENOUS
  Filled 2017-12-02: qty 250

## 2017-12-02 MED ORDER — DIPHENHYDRAMINE HCL 25 MG PO CAPS
ORAL_CAPSULE | ORAL | Status: AC
  Filled 2017-12-02: qty 1

## 2017-12-02 MED ORDER — DIPHENHYDRAMINE HCL 25 MG PO TABS
25.0000 mg | ORAL_TABLET | Freq: Once | ORAL | Status: AC
  Administered 2017-12-02: 25 mg via ORAL
  Filled 2017-12-02: qty 1

## 2017-12-02 NOTE — Patient Instructions (Signed)
Blood Transfusion, Care After This sheet gives you information about how to care for yourself after your procedure. Your doctor may also give you more specific instructions. If you have problems or questions, contact your doctor. Follow these instructions at home:  Take over-the-counter and prescription medicines only as told by your doctor.  Go back to your normal activities as told by your doctor.  Follow instructions from your doctor about how to take care of the area where an IV tube was put into your vein (insertion site). Make sure you: ? Wash your hands with soap and water before you change your bandage (dressing). If there is no soap and water, use hand sanitizer. ? Change your bandage as told by your doctor.  Check your IV insertion site every day for signs of infection. Check for: ? More redness, swelling, or pain. ? More fluid or blood. ? Warmth. ? Pus or a bad smell. Contact a doctor if:  You have more redness, swelling, or pain around the IV insertion site..  You have more fluid or blood coming from the IV insertion site.  Your IV insertion site feels warm to the touch.  You have pus or a bad smell coming from the IV insertion site.  Your pee (urine) turns pink, red, or brown.  You feel weak after doing your normal activities. Get help right away if:  You have signs of a serious allergic or body defense (immune) system reaction, including: ? Itchiness. ? Hives. ? Trouble breathing. ? Anxiety. ? Pain in your chest or lower back. ? Fever, flushing, and chills. ? Fast pulse. ? Rash. ? Watery poop (diarrhea). ? Throwing up (vomiting). ? Dark pee. ? Serious headache. ? Dizziness. ? Stiff neck. ? Yellow color in your face or the white parts of your eyes (jaundice). Summary  After a blood transfusion, return to your normal activities as told by your doctor.  Every day, check for signs of infection where the IV tube was put into your vein.  Some signs of  infection are warm skin, more redness and pain, more fluid or blood, and pus or a bad smell where the needle went in.  Contact your doctor if you feel weak or have any unusual symptoms. This information is not intended to replace advice given to you by your health care provider. Make sure you discuss any questions you have with your health care provider. Document Released: 03/15/2014 Document Revised: 10/17/2015 Document Reviewed: 10/17/2015 Elsevier Interactive Patient Education  2017 [employer]. Ferric carboxymaltose injection What is this medicine? FERRIC CARBOXYMALTOSE (ferr-ik car-box-ee-mol-toes) is an iron complex. Iron is used to make healthy red blood cells, which carry oxygen and nutrients throughout the body. This medicine is used to treat anemia in people with chronic kidney disease or people who cannot take iron by mouth. This medicine may be used for other purposes; ask your health care provider or pharmacist if you have questions. COMMON BRAND NAME(S): Injectafer What should I tell my health care provider before I take this medicine? They need to know if you have any of these conditions: -anemia not caused by low iron levels -high levels of iron in the blood -liver disease -an unusual or allergic reaction to iron, other medicines, foods, dyes, or preservatives -pregnant or trying to get pregnant -breast-feeding How should I use this medicine? This medicine is for infusion into a vein. It is given by a health care professional in a hospital or clinic setting. Talk to your pediatrician regarding   the use of this medicine in children. Special care may be needed. Overdosage: If you think you have taken too much of this medicine contact a poison control center or emergency room at once. NOTE: This medicine is only for you. Do not share this medicine with others. What if I miss a dose? It is important not to miss your dose. Call your doctor or health care professional if you are  unable to keep an appointment. What may interact with this medicine? Do not take this medicine with any of the following medications: -deferoxamine -dimercaprol -other iron products This medicine may also interact with the following medications: -chloramphenicol -deferasirox This list may not describe all possible interactions. Give your health care provider a list of all the medicines, herbs, non-prescription drugs, or dietary supplements you use. Also tell them if you smoke, drink alcohol, or use illegal drugs. Some items may interact with your medicine. What should I watch for while using this medicine? Visit your doctor or health care professional regularly. Tell your doctor if your symptoms do not start to get better or if they get worse. You may need blood work done while you are taking this medicine. You may need to follow a special diet. Talk to your doctor. Foods that contain iron include: whole grains/cereals, dried fruits, beans, or peas, leafy green vegetables, and organ meats (liver, kidney). What side effects may I notice from receiving this medicine? Side effects that you should report to your doctor or health care professional as soon as possible: -allergic reactions like skin rash, itching or hives, swelling of the face, lips, or tongue -breathing problems -changes in blood pressure -feeling faint or lightheaded, falls -flushing, sweating, or hot feelings Side effects that usually do not require medical attention (report to your doctor or health care professional if they continue or are bothersome): -changes in taste -constipation -dizziness -headache -nausea -pain, redness, or irritation at site where injected -vomiting This list may not describe all possible side effects. Call your doctor for medical advice about side effects. You may report side effects to FDA at 1-800-FDA-1088. Where should I keep my medicine? This drug is given in a hospital or clinic and will not  be stored at home. NOTE: This sheet is a summary. It may not cover all possible information. If you have questions about this medicine, talk to your doctor, pharmacist, or health care provider.  2018 Elsevier/Gold Standard (2015-03-27 11:20:47)  

## 2017-12-05 LAB — TYPE AND SCREEN
ABO/RH(D): A POS
Antibody Screen: NEGATIVE
Unit division: 0
Unit division: 0

## 2017-12-05 LAB — BPAM RBC
Blood Product Expiration Date: 201910212359
Blood Product Expiration Date: 201910212359
ISSUE DATE / TIME: 201909271226
ISSUE DATE / TIME: 201909271429
Unit Type and Rh: 6200
Unit Type and Rh: 6200

## 2017-12-07 ENCOUNTER — Inpatient Hospital Stay: Payer: Medicare Other | Attending: Oncology

## 2017-12-07 VITALS — BP 127/45 | HR 82 | Temp 99.5°F | Resp 19

## 2017-12-07 DIAGNOSIS — D631 Anemia in chronic kidney disease: Secondary | ICD-10-CM | POA: Insufficient documentation

## 2017-12-07 DIAGNOSIS — I129 Hypertensive chronic kidney disease with stage 1 through stage 4 chronic kidney disease, or unspecified chronic kidney disease: Secondary | ICD-10-CM | POA: Diagnosis not present

## 2017-12-07 DIAGNOSIS — D509 Iron deficiency anemia, unspecified: Secondary | ICD-10-CM | POA: Diagnosis not present

## 2017-12-07 DIAGNOSIS — N189 Chronic kidney disease, unspecified: Secondary | ICD-10-CM | POA: Diagnosis not present

## 2017-12-07 DIAGNOSIS — D649 Anemia, unspecified: Secondary | ICD-10-CM

## 2017-12-07 MED ORDER — SODIUM CHLORIDE 0.9 % IV SOLN
Freq: Once | INTRAVENOUS | Status: AC
  Administered 2017-12-07: 16:00:00 via INTRAVENOUS
  Filled 2017-12-07: qty 250

## 2017-12-07 MED ORDER — SODIUM CHLORIDE 0.9 % IV SOLN
510.0000 mg | Freq: Once | INTRAVENOUS | Status: AC
  Administered 2017-12-07: 510 mg via INTRAVENOUS
  Filled 2017-12-07: qty 17

## 2017-12-07 NOTE — Patient Instructions (Signed)

## 2017-12-09 DIAGNOSIS — Z23 Encounter for immunization: Secondary | ICD-10-CM | POA: Diagnosis not present

## 2017-12-21 ENCOUNTER — Inpatient Hospital Stay: Payer: Medicare Other

## 2017-12-21 VITALS — BP 121/59 | HR 96 | Temp 98.4°F | Resp 18

## 2017-12-21 DIAGNOSIS — D509 Iron deficiency anemia, unspecified: Secondary | ICD-10-CM | POA: Diagnosis not present

## 2017-12-21 DIAGNOSIS — N189 Chronic kidney disease, unspecified: Principal | ICD-10-CM

## 2017-12-21 DIAGNOSIS — D631 Anemia in chronic kidney disease: Secondary | ICD-10-CM | POA: Diagnosis not present

## 2017-12-21 DIAGNOSIS — I129 Hypertensive chronic kidney disease with stage 1 through stage 4 chronic kidney disease, or unspecified chronic kidney disease: Secondary | ICD-10-CM | POA: Diagnosis not present

## 2017-12-21 DIAGNOSIS — D649 Anemia, unspecified: Secondary | ICD-10-CM

## 2017-12-21 LAB — CBC WITH DIFFERENTIAL (CANCER CENTER ONLY)
Abs Immature Granulocytes: 0.01 10*3/uL (ref 0.00–0.07)
Basophils Absolute: 0 10*3/uL (ref 0.0–0.1)
Basophils Relative: 1 %
Eosinophils Absolute: 0.1 10*3/uL (ref 0.0–0.5)
Eosinophils Relative: 1 %
HCT: 32.1 % — ABNORMAL LOW (ref 36.0–46.0)
Hemoglobin: 10.2 g/dL — ABNORMAL LOW (ref 12.0–15.0)
Immature Granulocytes: 0 %
Lymphocytes Relative: 9 %
Lymphs Abs: 0.5 10*3/uL — ABNORMAL LOW (ref 0.7–4.0)
MCH: 31.2 pg (ref 26.0–34.0)
MCHC: 31.8 g/dL (ref 30.0–36.0)
MCV: 98.2 fL (ref 80.0–100.0)
Monocytes Absolute: 0.6 10*3/uL (ref 0.1–1.0)
Monocytes Relative: 12 %
Neutro Abs: 3.9 10*3/uL (ref 1.7–7.7)
Neutrophils Relative %: 77 %
Platelet Count: 183 10*3/uL (ref 150–400)
RBC: 3.27 MIL/uL — ABNORMAL LOW (ref 3.87–5.11)
RDW: 17 % — ABNORMAL HIGH (ref 11.5–15.5)
WBC Count: 5 10*3/uL (ref 4.0–10.5)
nRBC: 0 % (ref 0.0–0.2)

## 2017-12-21 LAB — SAMPLE TO BLOOD BANK

## 2017-12-21 MED ORDER — DARBEPOETIN ALFA 300 MCG/0.6ML IJ SOSY
300.0000 ug | PREFILLED_SYRINGE | Freq: Once | INTRAMUSCULAR | Status: AC
  Administered 2017-12-21: 300 ug via SUBCUTANEOUS

## 2017-12-21 MED ORDER — DARBEPOETIN ALFA 300 MCG/0.6ML IJ SOSY
PREFILLED_SYRINGE | INTRAMUSCULAR | Status: AC
  Filled 2017-12-21: qty 0.6

## 2017-12-21 NOTE — Patient Instructions (Signed)

## 2018-01-10 ENCOUNTER — Telehealth: Payer: Self-pay | Admitting: Oncology

## 2018-01-10 DIAGNOSIS — M810 Age-related osteoporosis without current pathological fracture: Secondary | ICD-10-CM | POA: Diagnosis not present

## 2018-01-10 DIAGNOSIS — I6529 Occlusion and stenosis of unspecified carotid artery: Secondary | ICD-10-CM | POA: Diagnosis not present

## 2018-01-10 DIAGNOSIS — M1380 Other specified arthritis, unspecified site: Secondary | ICD-10-CM | POA: Diagnosis not present

## 2018-01-10 DIAGNOSIS — G5603 Carpal tunnel syndrome, bilateral upper limbs: Secondary | ICD-10-CM | POA: Diagnosis not present

## 2018-01-10 DIAGNOSIS — E785 Hyperlipidemia, unspecified: Secondary | ICD-10-CM | POA: Diagnosis not present

## 2018-01-10 DIAGNOSIS — K449 Diaphragmatic hernia without obstruction or gangrene: Secondary | ICD-10-CM | POA: Diagnosis not present

## 2018-01-10 DIAGNOSIS — D464 Refractory anemia, unspecified: Secondary | ICD-10-CM | POA: Diagnosis not present

## 2018-01-10 DIAGNOSIS — R7301 Impaired fasting glucose: Secondary | ICD-10-CM | POA: Diagnosis not present

## 2018-01-10 DIAGNOSIS — D649 Anemia, unspecified: Secondary | ICD-10-CM | POA: Diagnosis not present

## 2018-01-10 DIAGNOSIS — E559 Vitamin D deficiency, unspecified: Secondary | ICD-10-CM | POA: Diagnosis not present

## 2018-01-10 DIAGNOSIS — M6281 Muscle weakness (generalized): Secondary | ICD-10-CM | POA: Diagnosis not present

## 2018-01-10 DIAGNOSIS — G250 Essential tremor: Secondary | ICD-10-CM | POA: Diagnosis not present

## 2018-01-10 DIAGNOSIS — R278 Other lack of coordination: Secondary | ICD-10-CM | POA: Diagnosis not present

## 2018-01-10 DIAGNOSIS — H4010X1 Unspecified open-angle glaucoma, mild stage: Secondary | ICD-10-CM | POA: Diagnosis not present

## 2018-01-10 NOTE — Telephone Encounter (Signed)
[name] call day 11/6 - moved lab/fu/inj to 1pm. Spoke with dtr.

## 2018-01-11 ENCOUNTER — Inpatient Hospital Stay: Payer: Medicare Other | Attending: Oncology | Admitting: Oncology

## 2018-01-11 ENCOUNTER — Other Ambulatory Visit: Payer: Medicare Other

## 2018-01-11 ENCOUNTER — Ambulatory Visit: Payer: Medicare Other

## 2018-01-11 ENCOUNTER — Inpatient Hospital Stay: Payer: Medicare Other

## 2018-01-11 ENCOUNTER — Telehealth: Payer: Self-pay | Admitting: Oncology

## 2018-01-11 VITALS — BP 142/54 | HR 97 | Temp 98.4°F | Resp 18 | Ht 61.0 in | Wt 141.2 lb

## 2018-01-11 VITALS — BP 141/59

## 2018-01-11 DIAGNOSIS — D509 Iron deficiency anemia, unspecified: Secondary | ICD-10-CM | POA: Diagnosis not present

## 2018-01-11 DIAGNOSIS — Z79899 Other long term (current) drug therapy: Secondary | ICD-10-CM

## 2018-01-11 DIAGNOSIS — N189 Chronic kidney disease, unspecified: Principal | ICD-10-CM

## 2018-01-11 DIAGNOSIS — I129 Hypertensive chronic kidney disease with stage 1 through stage 4 chronic kidney disease, or unspecified chronic kidney disease: Secondary | ICD-10-CM | POA: Insufficient documentation

## 2018-01-11 DIAGNOSIS — N183 Chronic kidney disease, stage 3 (moderate): Secondary | ICD-10-CM | POA: Diagnosis not present

## 2018-01-11 DIAGNOSIS — D631 Anemia in chronic kidney disease: Secondary | ICD-10-CM

## 2018-01-11 DIAGNOSIS — D649 Anemia, unspecified: Secondary | ICD-10-CM

## 2018-01-11 LAB — CBC WITH DIFFERENTIAL (CANCER CENTER ONLY)
Abs Immature Granulocytes: 0.01 10*3/uL (ref 0.00–0.07)
Basophils Absolute: 0 10*3/uL (ref 0.0–0.1)
Basophils Relative: 0 %
Eosinophils Absolute: 0.1 10*3/uL (ref 0.0–0.5)
Eosinophils Relative: 1 %
HCT: 30.9 % — ABNORMAL LOW (ref 36.0–46.0)
Hemoglobin: 9.6 g/dL — ABNORMAL LOW (ref 12.0–15.0)
Immature Granulocytes: 0 %
Lymphocytes Relative: 9 %
Lymphs Abs: 0.6 10*3/uL — ABNORMAL LOW (ref 0.7–4.0)
MCH: 31.9 pg (ref 26.0–34.0)
MCHC: 31.1 g/dL (ref 30.0–36.0)
MCV: 102.7 fL — ABNORMAL HIGH (ref 80.0–100.0)
Monocytes Absolute: 0.5 10*3/uL (ref 0.1–1.0)
Monocytes Relative: 7 %
Neutro Abs: 5.7 10*3/uL (ref 1.7–7.7)
Neutrophils Relative %: 83 %
Platelet Count: 200 10*3/uL (ref 150–400)
RBC: 3.01 MIL/uL — ABNORMAL LOW (ref 3.87–5.11)
RDW: 16.6 % — ABNORMAL HIGH (ref 11.5–15.5)
WBC Count: 6.9 10*3/uL (ref 4.0–10.5)
nRBC: 0 % (ref 0.0–0.2)

## 2018-01-11 LAB — SAMPLE TO BLOOD BANK

## 2018-01-11 LAB — IRON AND TIBC
Iron: 108 ug/dL (ref 41–142)
Saturation Ratios: 48 % (ref 21–57)
TIBC: 226 ug/dL — ABNORMAL LOW (ref 236–444)
UIBC: 118 ug/dL — ABNORMAL LOW (ref 120–384)

## 2018-01-11 LAB — FERRITIN: Ferritin: 64 ng/mL (ref 11–307)

## 2018-01-11 MED ORDER — DARBEPOETIN ALFA 300 MCG/0.6ML IJ SOSY
300.0000 ug | PREFILLED_SYRINGE | Freq: Once | INTRAMUSCULAR | Status: AC
  Administered 2018-01-11: 300 ug via SUBCUTANEOUS

## 2018-01-11 MED ORDER — DARBEPOETIN ALFA 300 MCG/0.6ML IJ SOSY
PREFILLED_SYRINGE | INTRAMUSCULAR | Status: AC
  Filled 2018-01-11: qty 0.6

## 2018-01-11 NOTE — Telephone Encounter (Signed)
Appts scheduled avs/calendar printed per 11/6 los °

## 2018-01-11 NOTE — Progress Notes (Signed)
Hematology and Oncology Follow Up Visit  Lisa [name] [id number] 21-Aug-1923 84 y.o. 01/11/2018 1:38 PM Lisa [name], MDBarnes, Lisa Manis, MD   Principle Diagnosis: 84 year old woman with anemia 18.  She was found to have  iron deficiency, chronic renal insufficiency and chronic disease at that time.    Prior Therapy: IV iron infusion completed in November 2018.  She is also status post post packed red cell transfusion  Current therapy:   Aranesp 300 mcg every 3 weeks   Packed red cell transfusion and iron infusion as needed.  Interim History: [name] returns today for a repeat evaluation.  Since her last visit, she reports feeling reasonably well without any recent complaints.  She did receive packed red cell transfusion and intravenous iron in September and has felt well since that time.  She denies any recent fatigue or tiredness.  He denies any difficulty breathing or recent hospitalization.  Her performance status and quality of life remains stable.  She does not report any headaches, blurry vision, syncope or seizures.  She denies any confusion or lethargy.  She does not report any fevers, chills or sweats.  She does not report any chest pain, palpitation orthopnea.   She does not report any nausea, vomiting or abdominal distention.  She denies any depression or diarrhea.  She does not report any cough, wheezing or hemoptysis. She does not report any frequency urgency or hesitancy.  She does not report any paralysis or myalgias.  She does not report bleeding or clotting tendency.  She does not report any ecchymosis or petechiae.  Remaining review of systems is negative.   Medications: I have reviewed the patient's current medications.  Current Outpatient Medications  Medication Sig Dispense Refill  . acetaminophen (TYLENOL) 500 MG tablet Take 500-1,000 mg by mouth at bedtime.    Marland Kitchen aspirin EC 81 MG tablet Take 81 mg by mouth daily.    . calcium carbonate (OS-CAL) 600 MG TABS  Take 600 mg by mouth daily.     . ferrous sulfate 325 (65 FE) MG tablet Take 325 mg by mouth daily with breakfast.    . loratadine (CLARITIN) 10 MG tablet Take 10 mg by mouth daily as needed.     . Multiple Vitamins-Minerals (MULTIVITAMIN WITH MINERALS) tablet Take 1 tablet by mouth daily.    Marland Kitchen OVER THE COUNTER MEDICATION Apply topically as needed. Cream for arthritic pain. Patient unable to recall the name at this time.    Marland Kitchen oxybutynin (DITROPAN) 5 MG tablet Take 2.5 mg by mouth 2 (two) times daily.  2  . pramipexole (MIRAPEX) 0.25 MG tablet Take 0.25 mg by mouth at bedtime.    . primidone (MYSOLINE) 50 MG tablet Take 3 tablets (150 mg total) by mouth at bedtime. 270 tablet 4  . raloxifene (EVISTA) 60 MG tablet Take 60 mg by mouth at bedtime.     . timolol (TIMOPTIC) 0.5 % ophthalmic solution Place 1 drop into both eyes daily.   1  . UNABLE TO FIND Med Name: Aranesp infusions every 3 weeks administered by [facility]    . VYTORIN 10-20 MG tablet Take 1 tablet by mouth daily.  0   No current facility-administered medications for this visit.      Allergies:  Allergies  Allergen Reactions  . Mycostatin [Nystatin] Swelling and Rash    Past Medical History, Surgical history, Social history, and Family History were reviewed and updated.  Physical Exam:  Blood pressure (!) 142/54, pulse 97,  temperature 98.4 F (36.9 C), temperature source Oral, resp. rate 18, height 5\' 1"  (1.549 m), weight 141 lb 3.2 oz (64 kg), SpO2 98 %.     ECOG: 1   General appearance: Alert, awake without any distress. Head: Atraumatic without abnormalities Oropharynx: Without any thrush or ulcers. Eyes: No scleral icterus. Lymph nodes: No lymphadenopathy noted in the cervical, supraclavicular, or axillary nodes Heart:regular rate and rhythm, without any murmurs or gallops.   Lung: Clear to auscultation without any rhonchi, wheezes or dullness to percussion. Abdomin: Soft, nontender without any  shifting dullness or ascites. Musculoskeletal: No clubbing or cyanosis. Neurological: No motor or sensory deficits. Skin: No rashes or lesions.    Lab Results: Lab Results  Component Value Date   WBC 6.9 01/11/2018   HGB 9.6 (L) 01/11/2018   HCT 30.9 (L) 01/11/2018   MCV 102.7 (H) 01/11/2018   PLT 200 01/11/2018     Chemistry      Component Value Date/Time   NA 138 11/30/2017 1435   NA 135 (A) 04/01/2016   K 4.4 11/30/2017 1435   CL 103 11/30/2017 1435   CO2 30 11/30/2017 1435   BUN 21 11/30/2017 1435   BUN 19 04/01/2016   CREATININE 0.88 11/30/2017 1435   GLU 102 04/01/2016      Component Value Date/Time   CALCIUM 8.6 (L) 11/30/2017 1435   ALKPHOS 74 11/30/2017 1435   AST 17 11/30/2017 1435   ALT 16 11/30/2017 1435   BILITOT <0.2 (L) 11/30/2017 1435       Impression and Plan:  84 year old woman with  1.  Anemia: Multifactorial in nature related to chronically insufficiency, iron deficiency and chronic disease.  He is currently receiving growth factor support as well as as needed transfusions.   Her laboratory data were reviewed today and her hemoglobin at 9.6 does not require any transfusion but she will receive Aranesp.  We will repeat iron studies today and every 6 weeks and proceed with intravenous iron as needed.  Risks and benefits of this approach was reviewed today and is agreeable to continue.  2.  Iron deficiency: No GI blood losses noted.  Iron deficiency could be related to poor absorption.  I have recommended intravenous iron as needed.  3.  Follow-up: 9 weeks for repeat evaluation.  15  minutes was spent with the patient face-to-face today.  More than 50% of time was dedicated to discussing her disease status, treatment options and coordinating plan of care.Eli Hose, MD 11/6/20191:38 PM

## 2018-01-12 ENCOUNTER — Telehealth: Payer: Self-pay | Admitting: *Deleted

## 2018-01-12 NOTE — Telephone Encounter (Signed)
[facility] OT plan of care and d/c orders signed by Dr. Lucia Gaskins and returned via fax. Received a receipt of confirmation.

## 2018-01-18 IMAGING — DX DG CHEST 2V
2 series · 2 of 2 positions shown · non-contrast
Comparison: 07/03/2014.

CLINICAL DATA: Congestion and fever for almost 10 days and not
getting better.

EXAM:
CHEST  2 VIEW

[chest pa]
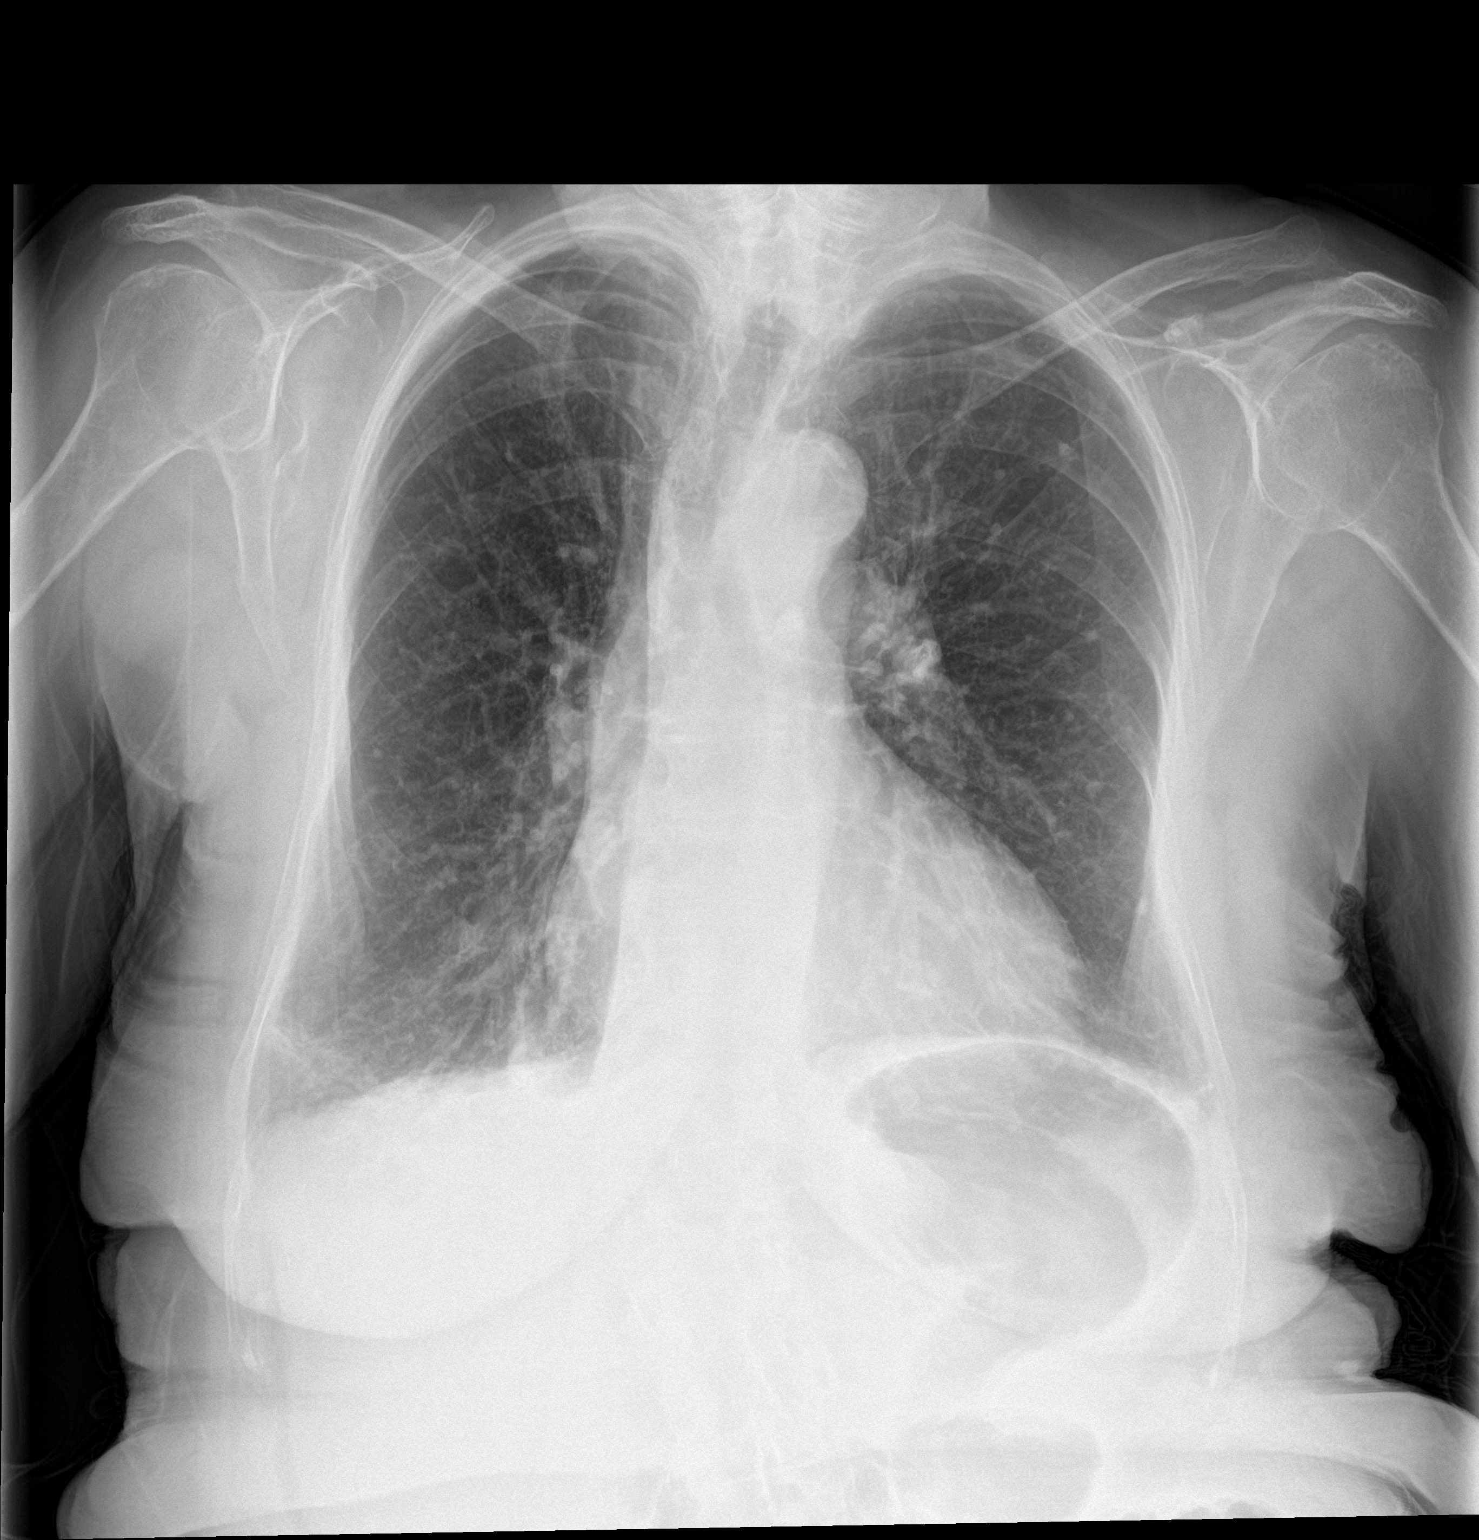

[chest lat]
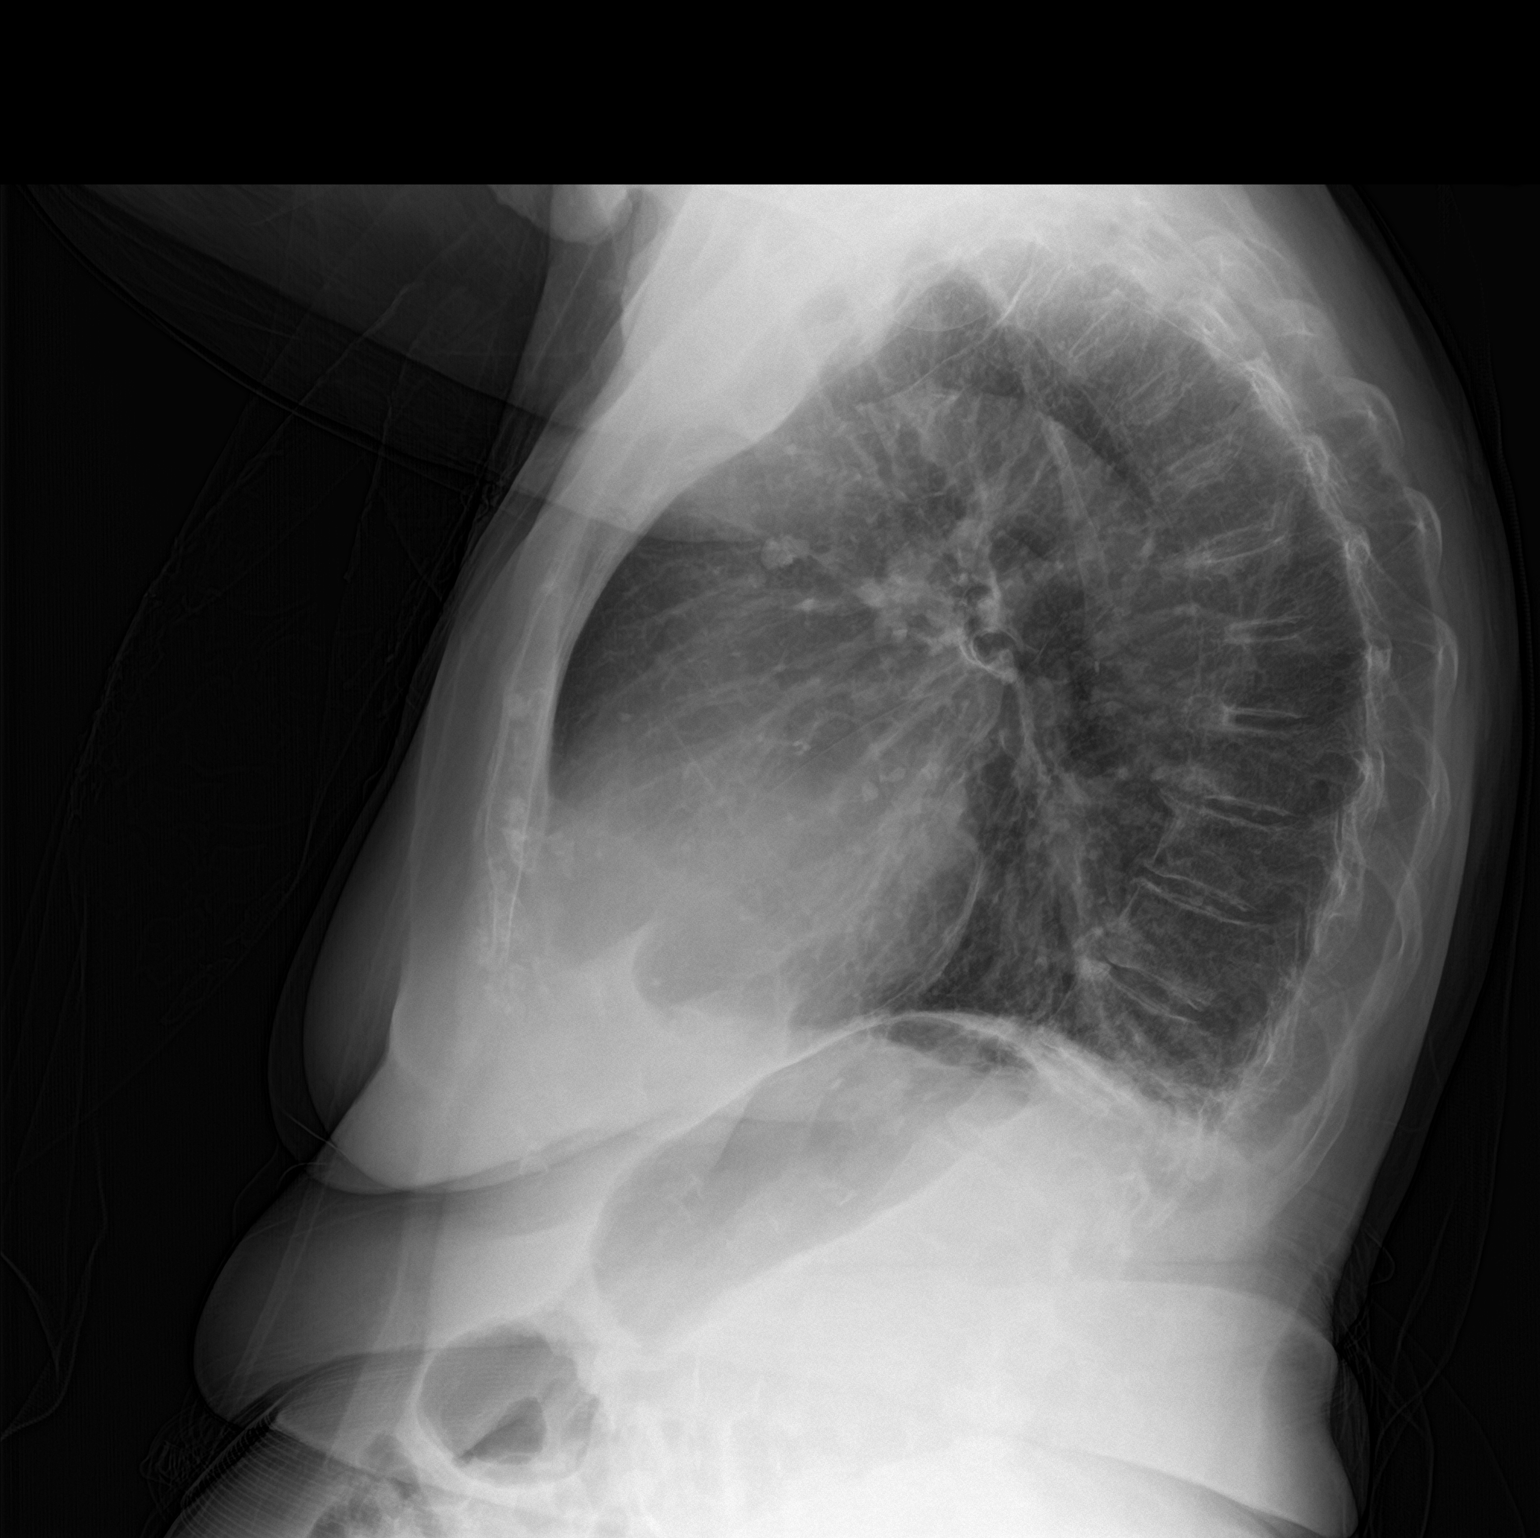

[2 of 2 positions shown; findings below may reference images not displayed]

FINDINGS: Cardiac silhouette is mildly enlarged. No mediastinal or hilar
masses. No convincing adenopathy. Calcified nodes are noted along
the left hilum. There calcified granuloma in the left upper lobe.
Scarring or chronic subsegmental atelectasis, or a combination, is
noted at the lung bases. Lungs are hyperexpanded but otherwise
clear.

No convincing pleural effusion.  No pneumothorax.

Skeletal structures are demineralized but grossly intact.
IMPRESSION: No acute cardiopulmonary disease.

## 2018-01-31 ENCOUNTER — Other Ambulatory Visit: Payer: Self-pay | Admitting: Oncology

## 2018-02-01 ENCOUNTER — Inpatient Hospital Stay: Payer: Medicare Other

## 2018-02-01 ENCOUNTER — Other Ambulatory Visit: Payer: Self-pay

## 2018-02-01 VITALS — BP 133/45 | HR 92 | Temp 98.9°F | Resp 16

## 2018-02-01 DIAGNOSIS — N183 Chronic kidney disease, stage 3 (moderate): Secondary | ICD-10-CM | POA: Diagnosis not present

## 2018-02-01 DIAGNOSIS — D631 Anemia in chronic kidney disease: Secondary | ICD-10-CM

## 2018-02-01 DIAGNOSIS — D649 Anemia, unspecified: Secondary | ICD-10-CM

## 2018-02-01 DIAGNOSIS — N189 Chronic kidney disease, unspecified: Principal | ICD-10-CM

## 2018-02-01 DIAGNOSIS — Z79899 Other long term (current) drug therapy: Secondary | ICD-10-CM | POA: Diagnosis not present

## 2018-02-01 DIAGNOSIS — D509 Iron deficiency anemia, unspecified: Secondary | ICD-10-CM | POA: Diagnosis not present

## 2018-02-01 DIAGNOSIS — I129 Hypertensive chronic kidney disease with stage 1 through stage 4 chronic kidney disease, or unspecified chronic kidney disease: Secondary | ICD-10-CM | POA: Diagnosis not present

## 2018-02-01 LAB — SAMPLE TO BLOOD BANK

## 2018-02-01 LAB — CBC WITH DIFFERENTIAL (CANCER CENTER ONLY)
Abs Immature Granulocytes: 0.02 10*3/uL (ref 0.00–0.07)
Basophils Absolute: 0 10*3/uL (ref 0.0–0.1)
Basophils Relative: 1 %
Eosinophils Absolute: 0.1 10*3/uL (ref 0.0–0.5)
Eosinophils Relative: 1 %
HCT: 25.7 % — ABNORMAL LOW (ref 36.0–46.0)
Hemoglobin: 8.1 g/dL — ABNORMAL LOW (ref 12.0–15.0)
Immature Granulocytes: 0 %
Lymphocytes Relative: 10 %
Lymphs Abs: 0.6 10*3/uL — ABNORMAL LOW (ref 0.7–4.0)
MCH: 31.2 pg (ref 26.0–34.0)
MCHC: 31.5 g/dL (ref 30.0–36.0)
MCV: 98.8 fL (ref 80.0–100.0)
Monocytes Absolute: 0.7 10*3/uL (ref 0.1–1.0)
Monocytes Relative: 12 %
Neutro Abs: 4.6 10*3/uL (ref 1.7–7.7)
Neutrophils Relative %: 76 %
Platelet Count: 209 10*3/uL (ref 150–400)
RBC: 2.6 MIL/uL — ABNORMAL LOW (ref 3.87–5.11)
RDW: 15 % (ref 11.5–15.5)
WBC Count: 6 10*3/uL (ref 4.0–10.5)
nRBC: 0 % (ref 0.0–0.2)

## 2018-02-01 MED ORDER — DARBEPOETIN ALFA 300 MCG/0.6ML IJ SOSY
300.0000 ug | PREFILLED_SYRINGE | Freq: Once | INTRAMUSCULAR | Status: AC
  Administered 2018-02-01: 300 ug via SUBCUTANEOUS

## 2018-02-01 MED ORDER — DARBEPOETIN ALFA 300 MCG/0.6ML IJ SOSY
PREFILLED_SYRINGE | INTRAMUSCULAR | Status: AC
  Filled 2018-02-01: qty 0.6

## 2018-02-01 NOTE — Progress Notes (Signed)
Per Dr. Clelia CroftShadad no feraheme needed today.  Will give aranesp injection only.  Pt to keep appt for blood on Friday.

## 2018-02-03 ENCOUNTER — Inpatient Hospital Stay: Payer: Medicare Other

## 2018-02-03 DIAGNOSIS — D509 Iron deficiency anemia, unspecified: Secondary | ICD-10-CM | POA: Diagnosis not present

## 2018-02-03 DIAGNOSIS — N183 Chronic kidney disease, stage 3 (moderate): Secondary | ICD-10-CM | POA: Diagnosis not present

## 2018-02-03 DIAGNOSIS — D631 Anemia in chronic kidney disease: Secondary | ICD-10-CM | POA: Diagnosis not present

## 2018-02-03 DIAGNOSIS — D649 Anemia, unspecified: Secondary | ICD-10-CM

## 2018-02-03 DIAGNOSIS — I129 Hypertensive chronic kidney disease with stage 1 through stage 4 chronic kidney disease, or unspecified chronic kidney disease: Secondary | ICD-10-CM | POA: Diagnosis not present

## 2018-02-03 DIAGNOSIS — Z79899 Other long term (current) drug therapy: Secondary | ICD-10-CM | POA: Diagnosis not present

## 2018-02-03 MED ORDER — ACETAMINOPHEN 325 MG PO TABS
650.0000 mg | ORAL_TABLET | Freq: Once | ORAL | Status: AC
  Administered 2018-02-03: 650 mg via ORAL

## 2018-02-03 MED ORDER — DIPHENHYDRAMINE HCL 25 MG PO CAPS
ORAL_CAPSULE | ORAL | Status: AC
  Filled 2018-02-03: qty 1

## 2018-02-03 MED ORDER — ACETAMINOPHEN 325 MG PO TABS
ORAL_TABLET | ORAL | Status: AC
  Filled 2018-02-03: qty 2

## 2018-02-03 MED ORDER — DIPHENHYDRAMINE HCL 25 MG PO CAPS
25.0000 mg | ORAL_CAPSULE | Freq: Once | ORAL | Status: AC
  Administered 2018-02-03: 25 mg via ORAL

## 2018-02-06 LAB — TYPE AND SCREEN
ABO/RH(D): A POS
Antibody Screen: NEGATIVE
Unit division: 0
Unit division: 0

## 2018-02-06 LAB — BPAM RBC
Blood Product Expiration Date: 201912232359
Blood Product Expiration Date: 201912232359
ISSUE DATE / TIME: 201911291315
ISSUE DATE / TIME: 201911291315
Unit Type and Rh: 6200
Unit Type and Rh: 6200

## 2018-02-09 DIAGNOSIS — L820 Inflamed seborrheic keratosis: Secondary | ICD-10-CM | POA: Diagnosis not present

## 2018-02-09 DIAGNOSIS — L821 Other seborrheic keratosis: Secondary | ICD-10-CM | POA: Diagnosis not present

## 2018-02-09 DIAGNOSIS — D485 Neoplasm of uncertain behavior of skin: Secondary | ICD-10-CM | POA: Diagnosis not present

## 2018-02-09 DIAGNOSIS — C44311 Basal cell carcinoma of skin of nose: Secondary | ICD-10-CM | POA: Diagnosis not present

## 2018-02-10 ENCOUNTER — Encounter (HOSPITAL_COMMUNITY): Payer: Medicare Other

## 2018-02-10 ENCOUNTER — Ambulatory Visit: Payer: Medicare Other | Admitting: Family

## 2018-02-22 ENCOUNTER — Inpatient Hospital Stay: Payer: Medicare Other | Attending: Oncology

## 2018-02-22 ENCOUNTER — Inpatient Hospital Stay: Payer: Medicare Other

## 2018-02-22 VITALS — BP 134/54 | HR 97

## 2018-02-22 DIAGNOSIS — D649 Anemia, unspecified: Secondary | ICD-10-CM

## 2018-02-22 DIAGNOSIS — Z79899 Other long term (current) drug therapy: Secondary | ICD-10-CM | POA: Diagnosis not present

## 2018-02-22 DIAGNOSIS — N189 Chronic kidney disease, unspecified: Secondary | ICD-10-CM | POA: Insufficient documentation

## 2018-02-22 DIAGNOSIS — I129 Hypertensive chronic kidney disease with stage 1 through stage 4 chronic kidney disease, or unspecified chronic kidney disease: Secondary | ICD-10-CM | POA: Insufficient documentation

## 2018-02-22 DIAGNOSIS — D631 Anemia in chronic kidney disease: Secondary | ICD-10-CM

## 2018-02-22 DIAGNOSIS — D509 Iron deficiency anemia, unspecified: Secondary | ICD-10-CM

## 2018-02-22 LAB — CBC WITH DIFFERENTIAL (CANCER CENTER ONLY)
Abs Immature Granulocytes: 0.03 10*3/uL (ref 0.00–0.07)
Basophils Absolute: 0 10*3/uL (ref 0.0–0.1)
Basophils Relative: 0 %
Eosinophils Absolute: 0 10*3/uL (ref 0.0–0.5)
Eosinophils Relative: 1 %
HCT: 28.7 % — ABNORMAL LOW (ref 36.0–46.0)
Hemoglobin: 8.9 g/dL — ABNORMAL LOW (ref 12.0–15.0)
Immature Granulocytes: 1 %
Lymphocytes Relative: 10 %
Lymphs Abs: 0.5 10*3/uL — ABNORMAL LOW (ref 0.7–4.0)
MCH: 29.9 pg (ref 26.0–34.0)
MCHC: 31 g/dL (ref 30.0–36.0)
MCV: 96.3 fL (ref 80.0–100.0)
Monocytes Absolute: 0.8 10*3/uL (ref 0.1–1.0)
Monocytes Relative: 15 %
Neutro Abs: 3.7 10*3/uL (ref 1.7–7.7)
Neutrophils Relative %: 73 %
Platelet Count: 170 10*3/uL (ref 150–400)
RBC: 2.98 MIL/uL — ABNORMAL LOW (ref 3.87–5.11)
RDW: 14.6 % (ref 11.5–15.5)
WBC Count: 5.1 10*3/uL (ref 4.0–10.5)
nRBC: 0 % (ref 0.0–0.2)

## 2018-02-22 LAB — FERRITIN: Ferritin: 15 ng/mL (ref 11–307)

## 2018-02-22 LAB — IRON AND TIBC
Iron: 94 ug/dL (ref 41–142)
Saturation Ratios: 39 % (ref 21–57)
TIBC: 238 ug/dL (ref 236–444)
UIBC: 144 ug/dL (ref 120–384)

## 2018-02-22 LAB — SAMPLE TO BLOOD BANK

## 2018-02-22 MED ORDER — DARBEPOETIN ALFA 300 MCG/0.6ML IJ SOSY
300.0000 ug | PREFILLED_SYRINGE | Freq: Once | INTRAMUSCULAR | Status: AC
  Administered 2018-02-22: 300 ug via SUBCUTANEOUS

## 2018-02-22 MED ORDER — DARBEPOETIN ALFA 300 MCG/0.6ML IJ SOSY
PREFILLED_SYRINGE | INTRAMUSCULAR | Status: AC
  Filled 2018-02-22: qty 0.6

## 2018-03-15 ENCOUNTER — Inpatient Hospital Stay (HOSPITAL_BASED_OUTPATIENT_CLINIC_OR_DEPARTMENT_OTHER): Payer: Medicare Other | Admitting: Oncology

## 2018-03-15 ENCOUNTER — Telehealth: Payer: Self-pay

## 2018-03-15 ENCOUNTER — Inpatient Hospital Stay: Payer: Medicare Other | Attending: Oncology

## 2018-03-15 ENCOUNTER — Inpatient Hospital Stay: Payer: Medicare Other

## 2018-03-15 VITALS — BP 149/56 | HR 103 | Temp 98.5°F | Resp 18 | Ht 61.0 in | Wt 144.4 lb

## 2018-03-15 DIAGNOSIS — D649 Anemia, unspecified: Secondary | ICD-10-CM

## 2018-03-15 DIAGNOSIS — Z7982 Long term (current) use of aspirin: Secondary | ICD-10-CM

## 2018-03-15 DIAGNOSIS — I129 Hypertensive chronic kidney disease with stage 1 through stage 4 chronic kidney disease, or unspecified chronic kidney disease: Secondary | ICD-10-CM | POA: Insufficient documentation

## 2018-03-15 DIAGNOSIS — D509 Iron deficiency anemia, unspecified: Secondary | ICD-10-CM

## 2018-03-15 DIAGNOSIS — Z79899 Other long term (current) drug therapy: Secondary | ICD-10-CM

## 2018-03-15 DIAGNOSIS — N189 Chronic kidney disease, unspecified: Principal | ICD-10-CM

## 2018-03-15 DIAGNOSIS — D631 Anemia in chronic kidney disease: Secondary | ICD-10-CM | POA: Diagnosis not present

## 2018-03-15 DIAGNOSIS — M2550 Pain in unspecified joint: Secondary | ICD-10-CM | POA: Diagnosis not present

## 2018-03-15 DIAGNOSIS — M791 Myalgia, unspecified site: Secondary | ICD-10-CM | POA: Insufficient documentation

## 2018-03-15 LAB — CBC WITH DIFFERENTIAL (CANCER CENTER ONLY)
Abs Immature Granulocytes: 0.02 10*3/uL (ref 0.00–0.07)
Basophils Absolute: 0 10*3/uL (ref 0.0–0.1)
Basophils Relative: 1 %
Eosinophils Absolute: 0.1 10*3/uL (ref 0.0–0.5)
Eosinophils Relative: 1 %
HCT: 23.8 % — ABNORMAL LOW (ref 36.0–46.0)
Hemoglobin: 7.2 g/dL — ABNORMAL LOW (ref 12.0–15.0)
Immature Granulocytes: 0 %
Lymphocytes Relative: 11 %
Lymphs Abs: 0.6 10*3/uL — ABNORMAL LOW (ref 0.7–4.0)
MCH: 28.9 pg (ref 26.0–34.0)
MCHC: 30.3 g/dL (ref 30.0–36.0)
MCV: 95.6 fL (ref 80.0–100.0)
Monocytes Absolute: 0.7 10*3/uL (ref 0.1–1.0)
Monocytes Relative: 13 %
Neutro Abs: 4.1 10*3/uL (ref 1.7–7.7)
Neutrophils Relative %: 74 %
Platelet Count: 204 10*3/uL (ref 150–400)
RBC: 2.49 MIL/uL — ABNORMAL LOW (ref 3.87–5.11)
RDW: 14.6 % (ref 11.5–15.5)
WBC Count: 5.6 10*3/uL (ref 4.0–10.5)
nRBC: 0 % (ref 0.0–0.2)

## 2018-03-15 LAB — SAMPLE TO BLOOD BANK

## 2018-03-15 MED ORDER — DARBEPOETIN ALFA 300 MCG/0.6ML IJ SOSY
PREFILLED_SYRINGE | INTRAMUSCULAR | Status: AC
  Filled 2018-03-15: qty 0.6

## 2018-03-15 MED ORDER — DARBEPOETIN ALFA 300 MCG/0.6ML IJ SOSY
300.0000 ug | PREFILLED_SYRINGE | Freq: Once | INTRAMUSCULAR | Status: AC
  Administered 2018-03-15: 300 ug via SUBCUTANEOUS

## 2018-03-15 NOTE — Telephone Encounter (Signed)
Printed avs and calender of upcoming appointment. Per 1/8 los 

## 2018-03-15 NOTE — Progress Notes (Signed)
Hematology and Oncology Follow Up Visit  BRIANA GARRELTS [id number] May 10, 1923 85 y.o. 03/15/2018 3:50 PM [name], MDBarnes, [name], MD   Principle Diagnosis: 85 year old woman with multifactorial anemia diagnosed in 2018.  She has element of chronic disease as well as iron deficiency.   Prior Therapy: IV iron infusion completed in November 2018.  She is also status post post packed red cell transfusion  Current therapy:   Aranesp 300 mcg every 3 weeks   Packed red cell transfusion and Feraheme IV iron infusion as needed.  Interim History: Ms. Helmlinger presents today for a repeat evaluation.  Since her last visit, she remains asymptomatic without any signs of symptoms or worsening anemia.  She denies excess fatigue or tiredness or changes in her performance status.  She ambulates without any difficulties with the help of a walker.  She denies any difficulty breathing or palpitation.  She does not report any headaches, blurry vision, syncope or seizures.  She denies any changes in mentation or excessive fatigue.  She does not report any fevers, chills or sweats.  She does not report any chest pain, palpitation orthopnea.   She does not report any nausea, vomiting or early satiety.  She denies any changes in bowel habits.  She does not report any cough, wheezing or hemoptysis. She does not report any urinary incontinence.  She does not report any bone pain or pathological fractures.  She does not report easy bruising or lymphadenopathy.  Remaining review of system is negative.  Medications: I have reviewed the patient's current medications.  Current Outpatient Medications  Medication Sig Dispense Refill  . acetaminophen (TYLENOL) 500 MG tablet Take 500-1,000 mg by mouth at bedtime.    Marland Kitchen aspirin EC 81 MG tablet Take 81 mg by mouth daily.    . calcium carbonate (OS-CAL) 600 MG TABS Take 600 mg by mouth daily.     . ferrous sulfate 325 (65 FE) MG tablet Take 325 mg by mouth daily with  breakfast.    . loratadine (CLARITIN) 10 MG tablet Take 10 mg by mouth daily as needed.     . Multiple Vitamins-Minerals (MULTIVITAMIN WITH MINERALS) tablet Take 1 tablet by mouth daily.    Marland Kitchen OVER THE COUNTER MEDICATION Apply topically as needed. Cream for arthritic pain. Patient unable to recall the name at this time.    Marland Kitchen oxybutynin (DITROPAN) 5 MG tablet Take 2.5 mg by mouth 2 (two) times daily.  2  . pramipexole (MIRAPEX) 0.25 MG tablet Take 0.25 mg by mouth at bedtime.    . primidone (MYSOLINE) 50 MG tablet Take 3 tablets (150 mg total) by mouth at bedtime. 270 tablet 4  . raloxifene (EVISTA) 60 MG tablet Take 60 mg by mouth at bedtime.     . timolol (TIMOPTIC) 0.5 % ophthalmic solution Place 1 drop into both eyes daily.   1  . UNABLE TO FIND Med Name: Aranesp infusions every 3 weeks administered by [facility]    . VYTORIN 10-20 MG tablet Take 1 tablet by mouth daily.  0   No current facility-administered medications for this visit.      Allergies:  Allergies  Allergen Reactions  . Mycostatin [Nystatin] Swelling and Rash    Past Medical History, Surgical history, Social history, and Family History were reviewed and updated.  Physical Exam:   Blood pressure (!) 149/56, pulse (!) 103, temperature 98.5 F (36.9 C), temperature source Oral, resp. rate 18, height 5\' 1"  (1.549 m), weight 144  lb 6.4 oz (65.5 kg), SpO2 96 %.     ECOG: 1     General appearance: Comfortable appearing without any discomfort Head: Normocephalic without any trauma Oropharynx: Mucous membranes are moist and pink without any thrush or ulcers. Eyes: Pupils are equal and round reactive to light. Lymph nodes: No cervical, supraclavicular, inguinal or axillary lymphadenopathy.   Heart:regular rate and rhythm.  S1 and S2 without leg edema. Lung: Clear without any rhonchi or wheezes.  No dullness to percussion. Abdomin: Soft, nontender, nondistended with good bowel sounds.  No  hepatosplenomegaly. Musculoskeletal: No joint deformity or effusion.  Full range of motion noted. Neurological: No deficits noted on motor, sensory and deep tendon reflex exam. Skin: No petechial rash or dryness.  Appeared moist.     Lab Results: Lab Results  Component Value Date   WBC 5.1 02/22/2018   HGB 8.9 (L) 02/22/2018   HCT 28.7 (L) 02/22/2018   MCV 96.3 02/22/2018   PLT 170 02/22/2018     Chemistry      Component Value Date/Time   NA 138 11/30/2017 1435   NA 135 (A) 04/01/2016   K 4.4 11/30/2017 1435   CL 103 11/30/2017 1435   CO2 30 11/30/2017 1435   BUN 21 11/30/2017 1435   BUN 19 04/01/2016   CREATININE 0.88 11/30/2017 1435   GLU 102 04/01/2016      Component Value Date/Time   CALCIUM 8.6 (L) 11/30/2017 1435   ALKPHOS 74 11/30/2017 1435   AST 17 11/30/2017 1435   ALT 16 11/30/2017 1435   BILITOT <0.2 (L) 11/30/2017 1435       Impression and Plan:  85 year old woman with  1.  Anemia diagnosed in 2018.  Her anemia is related to renal insufficiency as well as iron deficiency.  She continues to be on Aranesp 300 mcg every 3 weeks to keep her hemoglobin close to 11.  She will continue to receive IV iron and packed red cell transfusion as needed.  Her hemoglobin has declined to 7.2 and will receive 2 units of packed red cells in the immediate future.   2.  Iron deficiency:  Her iron studies in December 2019 were personally reviewed and appears to be declining and will likely require IV iron in the near future.  No bleeding noted at this time.  Her iron deficiency is likely due to to poor absorption issues.   Risks and benefits of intravenous iron infusion was discussed today.  These complications occluding arthralgias, myalgias and rarely anaphylaxis.  3.  Follow-up: Every 3 weeks for Aranesp and and 9 weeks for MD evaluation.  15  minutes was spent with the patient face-to-face today.  More than 50% of time was dedicated to reviewing her disease status,  laboratory data and coordinating plan of care.      Eli HoseFiras Lesha Jager, MD 1/8/20203:50 PM

## 2018-03-16 ENCOUNTER — Telehealth: Payer: Self-pay | Admitting: Oncology

## 2018-03-16 ENCOUNTER — Inpatient Hospital Stay: Payer: Medicare Other

## 2018-03-16 VITALS — BP 136/53 | HR 85 | Temp 98.6°F | Resp 16

## 2018-03-16 DIAGNOSIS — N189 Chronic kidney disease, unspecified: Secondary | ICD-10-CM | POA: Diagnosis not present

## 2018-03-16 DIAGNOSIS — D509 Iron deficiency anemia, unspecified: Secondary | ICD-10-CM | POA: Diagnosis not present

## 2018-03-16 DIAGNOSIS — D631 Anemia in chronic kidney disease: Secondary | ICD-10-CM | POA: Diagnosis not present

## 2018-03-16 DIAGNOSIS — Z7982 Long term (current) use of aspirin: Secondary | ICD-10-CM | POA: Diagnosis not present

## 2018-03-16 DIAGNOSIS — I129 Hypertensive chronic kidney disease with stage 1 through stage 4 chronic kidney disease, or unspecified chronic kidney disease: Secondary | ICD-10-CM | POA: Diagnosis not present

## 2018-03-16 DIAGNOSIS — M2550 Pain in unspecified joint: Secondary | ICD-10-CM | POA: Diagnosis not present

## 2018-03-16 DIAGNOSIS — Z79899 Other long term (current) drug therapy: Secondary | ICD-10-CM | POA: Diagnosis not present

## 2018-03-16 DIAGNOSIS — D649 Anemia, unspecified: Secondary | ICD-10-CM

## 2018-03-16 DIAGNOSIS — M791 Myalgia, unspecified site: Secondary | ICD-10-CM | POA: Diagnosis not present

## 2018-03-16 LAB — PREPARE RBC (CROSSMATCH)

## 2018-03-16 MED ORDER — SODIUM CHLORIDE 0.9% FLUSH
3.0000 mL | Freq: Once | INTRAVENOUS | Status: DC | PRN
  Filled 2018-03-16: qty 10

## 2018-03-16 MED ORDER — HEPARIN SOD (PORK) LOCK FLUSH 100 UNIT/ML IV SOLN
500.0000 [IU] | Freq: Once | INTRAVENOUS | Status: DC | PRN
  Filled 2018-03-16: qty 5

## 2018-03-16 MED ORDER — SODIUM CHLORIDE 0.9% FLUSH
10.0000 mL | Freq: Once | INTRAVENOUS | Status: DC | PRN
  Filled 2018-03-16: qty 10

## 2018-03-16 MED ORDER — HEPARIN SOD (PORK) LOCK FLUSH 100 UNIT/ML IV SOLN
250.0000 [IU] | Freq: Once | INTRAVENOUS | Status: DC | PRN
  Filled 2018-03-16: qty 5

## 2018-03-16 MED ORDER — SODIUM CHLORIDE 0.9 % IV SOLN
Freq: Once | INTRAVENOUS | Status: DC
  Filled 2018-03-16: qty 250

## 2018-03-16 MED ORDER — SODIUM CHLORIDE 0.9% IV SOLUTION
250.0000 mL | Freq: Once | INTRAVENOUS | Status: AC
  Administered 2018-03-16: 250 mL via INTRAVENOUS
  Filled 2018-03-16: qty 250

## 2018-03-16 MED ORDER — SODIUM CHLORIDE 0.9 % IV SOLN
510.0000 mg | Freq: Once | INTRAVENOUS | Status: AC
  Administered 2018-03-16: 510 mg via INTRAVENOUS
  Filled 2018-03-16: qty 17

## 2018-03-16 MED ORDER — ALTEPLASE 2 MG IJ SOLR
2.0000 mg | Freq: Once | INTRAMUSCULAR | Status: DC | PRN
  Filled 2018-03-16: qty 2

## 2018-03-16 NOTE — Progress Notes (Signed)
Pt. Took own Tylenol 500mg  and benedryl 50mg   At 0930.

## 2018-03-16 NOTE — Telephone Encounter (Signed)
Patient daughter came in regards to her mother iv iron appointment.  Per Dr, Clelia Croft request, I scheduled an appointment for the patient to get IV iron next week 01/16, and he was going to take care of the one for today (01/09).

## 2018-03-16 NOTE — Progress Notes (Signed)
Patient is to receive 2 units PRBC's and iron infusion today per Dr. Clelia Croft.

## 2018-03-17 LAB — TYPE AND SCREEN
ABO/RH(D): A POS
Antibody Screen: NEGATIVE
Unit division: 0
Unit division: 0

## 2018-03-17 LAB — BPAM RBC
Blood Product Expiration Date: 202001312359
Blood Product Expiration Date: 202002012359
ISSUE DATE / TIME: 202001091051
ISSUE DATE / TIME: 202001091314
Unit Type and Rh: 6200
Unit Type and Rh: 6200

## 2018-03-23 ENCOUNTER — Inpatient Hospital Stay: Payer: Medicare Other

## 2018-03-23 VITALS — BP 132/49 | HR 86 | Temp 98.6°F | Resp 17

## 2018-03-23 DIAGNOSIS — M791 Myalgia, unspecified site: Secondary | ICD-10-CM | POA: Diagnosis not present

## 2018-03-23 DIAGNOSIS — D509 Iron deficiency anemia, unspecified: Secondary | ICD-10-CM | POA: Diagnosis not present

## 2018-03-23 DIAGNOSIS — M2550 Pain in unspecified joint: Secondary | ICD-10-CM | POA: Diagnosis not present

## 2018-03-23 DIAGNOSIS — D631 Anemia in chronic kidney disease: Secondary | ICD-10-CM | POA: Diagnosis not present

## 2018-03-23 DIAGNOSIS — Z7982 Long term (current) use of aspirin: Secondary | ICD-10-CM | POA: Diagnosis not present

## 2018-03-23 DIAGNOSIS — Z79899 Other long term (current) drug therapy: Secondary | ICD-10-CM | POA: Diagnosis not present

## 2018-03-23 DIAGNOSIS — I129 Hypertensive chronic kidney disease with stage 1 through stage 4 chronic kidney disease, or unspecified chronic kidney disease: Secondary | ICD-10-CM | POA: Diagnosis not present

## 2018-03-23 DIAGNOSIS — N189 Chronic kidney disease, unspecified: Secondary | ICD-10-CM | POA: Diagnosis not present

## 2018-03-23 DIAGNOSIS — D649 Anemia, unspecified: Secondary | ICD-10-CM

## 2018-03-23 MED ORDER — SODIUM CHLORIDE 0.9 % IV SOLN
Freq: Once | INTRAVENOUS | Status: AC
  Administered 2018-03-23: 16:00:00 via INTRAVENOUS
  Filled 2018-03-23: qty 250

## 2018-03-23 MED ORDER — SODIUM CHLORIDE 0.9 % IV SOLN
510.0000 mg | Freq: Once | INTRAVENOUS | Status: AC
  Administered 2018-03-23: 510 mg via INTRAVENOUS
  Filled 2018-03-23: qty 17

## 2018-03-23 NOTE — Patient Instructions (Signed)

## 2018-03-31 DIAGNOSIS — C44311 Basal cell carcinoma of skin of nose: Secondary | ICD-10-CM | POA: Diagnosis not present

## 2018-03-31 DIAGNOSIS — Z85828 Personal history of other malignant neoplasm of skin: Secondary | ICD-10-CM | POA: Diagnosis not present

## 2018-04-06 ENCOUNTER — Inpatient Hospital Stay: Payer: Medicare Other

## 2018-04-06 VITALS — BP 136/54 | HR 89 | Temp 98.7°F | Resp 18

## 2018-04-06 DIAGNOSIS — D509 Iron deficiency anemia, unspecified: Secondary | ICD-10-CM

## 2018-04-06 DIAGNOSIS — M2550 Pain in unspecified joint: Secondary | ICD-10-CM | POA: Diagnosis not present

## 2018-04-06 DIAGNOSIS — I129 Hypertensive chronic kidney disease with stage 1 through stage 4 chronic kidney disease, or unspecified chronic kidney disease: Secondary | ICD-10-CM | POA: Diagnosis not present

## 2018-04-06 DIAGNOSIS — L603 Nail dystrophy: Secondary | ICD-10-CM | POA: Diagnosis not present

## 2018-04-06 DIAGNOSIS — M791 Myalgia, unspecified site: Secondary | ICD-10-CM | POA: Diagnosis not present

## 2018-04-06 DIAGNOSIS — D631 Anemia in chronic kidney disease: Secondary | ICD-10-CM

## 2018-04-06 DIAGNOSIS — N189 Chronic kidney disease, unspecified: Secondary | ICD-10-CM | POA: Diagnosis not present

## 2018-04-06 DIAGNOSIS — Z7982 Long term (current) use of aspirin: Secondary | ICD-10-CM | POA: Diagnosis not present

## 2018-04-06 DIAGNOSIS — D649 Anemia, unspecified: Secondary | ICD-10-CM

## 2018-04-06 DIAGNOSIS — Z79899 Other long term (current) drug therapy: Secondary | ICD-10-CM | POA: Diagnosis not present

## 2018-04-06 DIAGNOSIS — L600 Ingrowing nail: Secondary | ICD-10-CM | POA: Diagnosis not present

## 2018-04-06 DIAGNOSIS — B351 Tinea unguium: Secondary | ICD-10-CM | POA: Diagnosis not present

## 2018-04-06 DIAGNOSIS — I739 Peripheral vascular disease, unspecified: Secondary | ICD-10-CM | POA: Diagnosis not present

## 2018-04-06 LAB — IRON AND TIBC
Iron: 84 ug/dL (ref 41–142)
Saturation Ratios: 41 % (ref 21–57)
TIBC: 204 ug/dL — ABNORMAL LOW (ref 236–444)
UIBC: 119 ug/dL — ABNORMAL LOW (ref 120–384)

## 2018-04-06 LAB — CBC WITH DIFFERENTIAL (CANCER CENTER ONLY)
Abs Immature Granulocytes: 0.01 10*3/uL (ref 0.00–0.07)
Basophils Absolute: 0 10*3/uL (ref 0.0–0.1)
Basophils Relative: 1 %
Eosinophils Absolute: 0 10*3/uL (ref 0.0–0.5)
Eosinophils Relative: 1 %
HCT: 30.6 % — ABNORMAL LOW (ref 36.0–46.0)
Hemoglobin: 9.4 g/dL — ABNORMAL LOW (ref 12.0–15.0)
Immature Granulocytes: 0 %
Lymphocytes Relative: 11 %
Lymphs Abs: 0.6 10*3/uL — ABNORMAL LOW (ref 0.7–4.0)
MCH: 30.9 pg (ref 26.0–34.0)
MCHC: 30.7 g/dL (ref 30.0–36.0)
MCV: 100.7 fL — ABNORMAL HIGH (ref 80.0–100.0)
Monocytes Absolute: 0.5 10*3/uL (ref 0.1–1.0)
Monocytes Relative: 9 %
Neutro Abs: 4.4 10*3/uL (ref 1.7–7.7)
Neutrophils Relative %: 78 %
Platelet Count: 196 10*3/uL (ref 150–400)
RBC: 3.04 MIL/uL — ABNORMAL LOW (ref 3.87–5.11)
RDW: 17.3 % — ABNORMAL HIGH (ref 11.5–15.5)
WBC Count: 5.6 10*3/uL (ref 4.0–10.5)
nRBC: 0 % (ref 0.0–0.2)

## 2018-04-06 LAB — FERRITIN: Ferritin: 229 ng/mL (ref 11–307)

## 2018-04-06 LAB — SAMPLE TO BLOOD BANK

## 2018-04-06 MED ORDER — DARBEPOETIN ALFA 300 MCG/0.6ML IJ SOSY
PREFILLED_SYRINGE | INTRAMUSCULAR | Status: AC
  Filled 2018-04-06: qty 0.6

## 2018-04-06 MED ORDER — DARBEPOETIN ALFA 300 MCG/0.6ML IJ SOSY
300.0000 ug | PREFILLED_SYRINGE | Freq: Once | INTRAMUSCULAR | Status: AC
  Administered 2018-04-06: 300 ug via SUBCUTANEOUS

## 2018-04-06 NOTE — Patient Instructions (Signed)

## 2018-04-13 DIAGNOSIS — R7309 Other abnormal glucose: Secondary | ICD-10-CM | POA: Diagnosis not present

## 2018-04-13 DIAGNOSIS — E559 Vitamin D deficiency, unspecified: Secondary | ICD-10-CM | POA: Diagnosis not present

## 2018-04-13 DIAGNOSIS — D509 Iron deficiency anemia, unspecified: Secondary | ICD-10-CM | POA: Diagnosis not present

## 2018-04-13 DIAGNOSIS — E782 Mixed hyperlipidemia: Secondary | ICD-10-CM | POA: Diagnosis not present

## 2018-04-18 DIAGNOSIS — R7301 Impaired fasting glucose: Secondary | ICD-10-CM | POA: Diagnosis not present

## 2018-04-18 DIAGNOSIS — I10 Essential (primary) hypertension: Secondary | ICD-10-CM | POA: Diagnosis not present

## 2018-04-18 DIAGNOSIS — E782 Mixed hyperlipidemia: Secondary | ICD-10-CM | POA: Diagnosis not present

## 2018-04-18 DIAGNOSIS — E559 Vitamin D deficiency, unspecified: Secondary | ICD-10-CM | POA: Diagnosis not present

## 2018-04-26 ENCOUNTER — Inpatient Hospital Stay: Payer: Medicare Other | Attending: Oncology

## 2018-04-26 ENCOUNTER — Inpatient Hospital Stay: Payer: Medicare Other

## 2018-04-26 VITALS — BP 132/54 | HR 85 | Temp 98.8°F

## 2018-04-26 DIAGNOSIS — Z79899 Other long term (current) drug therapy: Secondary | ICD-10-CM | POA: Diagnosis not present

## 2018-04-26 DIAGNOSIS — I129 Hypertensive chronic kidney disease with stage 1 through stage 4 chronic kidney disease, or unspecified chronic kidney disease: Secondary | ICD-10-CM | POA: Diagnosis not present

## 2018-04-26 DIAGNOSIS — N189 Chronic kidney disease, unspecified: Principal | ICD-10-CM

## 2018-04-26 DIAGNOSIS — D509 Iron deficiency anemia, unspecified: Secondary | ICD-10-CM | POA: Diagnosis not present

## 2018-04-26 DIAGNOSIS — D631 Anemia in chronic kidney disease: Secondary | ICD-10-CM | POA: Insufficient documentation

## 2018-04-26 DIAGNOSIS — D649 Anemia, unspecified: Secondary | ICD-10-CM

## 2018-04-26 LAB — SAMPLE TO BLOOD BANK

## 2018-04-26 LAB — CBC WITH DIFFERENTIAL (CANCER CENTER ONLY)
Abs Immature Granulocytes: 0.01 10*3/uL (ref 0.00–0.07)
Basophils Absolute: 0 10*3/uL (ref 0.0–0.1)
Basophils Relative: 0 %
Eosinophils Absolute: 0.1 10*3/uL (ref 0.0–0.5)
Eosinophils Relative: 1 %
HCT: 27.3 % — ABNORMAL LOW (ref 36.0–46.0)
Hemoglobin: 8.8 g/dL — ABNORMAL LOW (ref 12.0–15.0)
Immature Granulocytes: 0 %
Lymphocytes Relative: 9 %
Lymphs Abs: 0.5 10*3/uL — ABNORMAL LOW (ref 0.7–4.0)
MCH: 32.7 pg (ref 26.0–34.0)
MCHC: 32.2 g/dL (ref 30.0–36.0)
MCV: 101.5 fL — ABNORMAL HIGH (ref 80.0–100.0)
Monocytes Absolute: 0.6 10*3/uL (ref 0.1–1.0)
Monocytes Relative: 11 %
Neutro Abs: 4.6 10*3/uL (ref 1.7–7.7)
Neutrophils Relative %: 79 %
Platelet Count: 211 10*3/uL (ref 150–400)
RBC: 2.69 MIL/uL — ABNORMAL LOW (ref 3.87–5.11)
RDW: 16.9 % — ABNORMAL HIGH (ref 11.5–15.5)
WBC Count: 5.8 10*3/uL (ref 4.0–10.5)
nRBC: 0 % (ref 0.0–0.2)

## 2018-04-26 MED ORDER — DARBEPOETIN ALFA 300 MCG/0.6ML IJ SOSY
PREFILLED_SYRINGE | INTRAMUSCULAR | Status: AC
  Filled 2018-04-26: qty 0.6

## 2018-04-26 MED ORDER — DARBEPOETIN ALFA 300 MCG/0.6ML IJ SOSY
300.0000 ug | PREFILLED_SYRINGE | Freq: Once | INTRAMUSCULAR | Status: AC
  Administered 2018-04-26: 300 ug via SUBCUTANEOUS

## 2018-04-26 NOTE — Progress Notes (Signed)
Daughter requested that A1C be reviewed by Dr. Clelia Croft that was done 04/13/18 at her PCP. Was wanting to know if another A1C needs to be done because of low A1C.Boykin Reaper LPN

## 2018-05-16 ENCOUNTER — Inpatient Hospital Stay (HOSPITAL_BASED_OUTPATIENT_CLINIC_OR_DEPARTMENT_OTHER): Payer: Medicare Other | Admitting: Oncology

## 2018-05-16 ENCOUNTER — Inpatient Hospital Stay: Payer: Medicare Other | Attending: Oncology

## 2018-05-16 ENCOUNTER — Telehealth: Payer: Self-pay | Admitting: Oncology

## 2018-05-16 ENCOUNTER — Inpatient Hospital Stay: Payer: Medicare Other

## 2018-05-16 VITALS — BP 138/47 | HR 93 | Temp 98.2°F | Resp 18 | Ht 61.0 in | Wt 142.8 lb

## 2018-05-16 DIAGNOSIS — D649 Anemia, unspecified: Secondary | ICD-10-CM

## 2018-05-16 DIAGNOSIS — D509 Iron deficiency anemia, unspecified: Secondary | ICD-10-CM

## 2018-05-16 DIAGNOSIS — Z79899 Other long term (current) drug therapy: Secondary | ICD-10-CM | POA: Insufficient documentation

## 2018-05-16 DIAGNOSIS — D631 Anemia in chronic kidney disease: Secondary | ICD-10-CM

## 2018-05-16 DIAGNOSIS — N189 Chronic kidney disease, unspecified: Secondary | ICD-10-CM | POA: Diagnosis not present

## 2018-05-16 DIAGNOSIS — I129 Hypertensive chronic kidney disease with stage 1 through stage 4 chronic kidney disease, or unspecified chronic kidney disease: Secondary | ICD-10-CM | POA: Diagnosis not present

## 2018-05-16 DIAGNOSIS — D638 Anemia in other chronic diseases classified elsewhere: Secondary | ICD-10-CM | POA: Insufficient documentation

## 2018-05-16 DIAGNOSIS — Z7982 Long term (current) use of aspirin: Secondary | ICD-10-CM | POA: Diagnosis not present

## 2018-05-16 LAB — CBC WITH DIFFERENTIAL (CANCER CENTER ONLY)
Abs Immature Granulocytes: 0.02 10*3/uL (ref 0.00–0.07)
Basophils Absolute: 0 10*3/uL (ref 0.0–0.1)
Basophils Relative: 0 %
Eosinophils Absolute: 0 10*3/uL (ref 0.0–0.5)
Eosinophils Relative: 1 %
HCT: 25.7 % — ABNORMAL LOW (ref 36.0–46.0)
Hemoglobin: 7.8 g/dL — ABNORMAL LOW (ref 12.0–15.0)
Immature Granulocytes: 0 %
Lymphocytes Relative: 11 %
Lymphs Abs: 0.6 10*3/uL — ABNORMAL LOW (ref 0.7–4.0)
MCH: 30.7 pg (ref 26.0–34.0)
MCHC: 30.4 g/dL (ref 30.0–36.0)
MCV: 101.2 fL — ABNORMAL HIGH (ref 80.0–100.0)
Monocytes Absolute: 0.6 10*3/uL (ref 0.1–1.0)
Monocytes Relative: 12 %
Neutro Abs: 4.1 10*3/uL (ref 1.7–7.7)
Neutrophils Relative %: 76 %
Platelet Count: 227 10*3/uL (ref 150–400)
RBC: 2.54 MIL/uL — ABNORMAL LOW (ref 3.87–5.11)
RDW: 14.6 % (ref 11.5–15.5)
WBC Count: 5.4 10*3/uL (ref 4.0–10.5)
nRBC: 0 % (ref 0.0–0.2)

## 2018-05-16 LAB — IRON AND TIBC
Iron: 94 ug/dL (ref 41–142)
Saturation Ratios: 41 % (ref 21–57)
TIBC: 229 ug/dL — ABNORMAL LOW (ref 236–444)
UIBC: 135 ug/dL (ref 120–384)

## 2018-05-16 LAB — FERRITIN: Ferritin: 15 ng/mL (ref 11–307)

## 2018-05-16 LAB — SAMPLE TO BLOOD BANK

## 2018-05-16 MED ORDER — DARBEPOETIN ALFA 300 MCG/0.6ML IJ SOSY
PREFILLED_SYRINGE | INTRAMUSCULAR | Status: AC
  Filled 2018-05-16: qty 0.6

## 2018-05-16 MED ORDER — DARBEPOETIN ALFA 300 MCG/0.6ML IJ SOSY
300.0000 ug | PREFILLED_SYRINGE | Freq: Once | INTRAMUSCULAR | Status: AC
  Administered 2018-05-16: 300 ug via SUBCUTANEOUS

## 2018-05-16 NOTE — Telephone Encounter (Signed)
Gave avs and calendar ° °

## 2018-05-16 NOTE — Patient Instructions (Signed)

## 2018-05-16 NOTE — Progress Notes (Signed)
Hematology and Oncology Follow Up Visit  Lisa [name] [id number] 1923-06-05 85 y.o. 05/16/2018 2:57 PM Lisa [name], MDBarnes, Lisa Manis, MD   Principle Diagnosis: 85 year old woman with anemia related to iron deficiency as well as chronic disease diagnosed in 2018.     Prior Therapy: IV iron infusion completed in November 2018.  She is also status post post packed red cell transfusion  Current therapy:   Aranesp 300 mcg every 3 weeks   IV iron infusion as needed.  She receives Feraheme at 1000 mg.  Last treatment given in January 2020.  Supportive packed red cell transfusion for hemoglobin below 8.  Interim History: Lisa [name] returns today for a follow-up visit.  Since the last visit, she reports no major changes in her health.  Her last transfusion was 9 weeks ago and has felt reasonably fair.  She denies any recent falls or syncope.  She denies excessive fatigue or tiredness.  Her performance status and quality of life remains unchanged.  Her mobility is limited to a walker but is able to maintain reasonable quality of life.  She denies any hematochezia, melena or dyspnea on exertion.  Patient denied any alteration mental status, neuropathy, confusion or dizziness.  Denies any headaches or lethargy.  Denies any night sweats, weight loss or changes in appetite.  Denied orthopnea, dyspnea on exertion or chest discomfort.  Denies shortness of breath, difficulty breathing hemoptysis or cough.  Denies any abdominal distention, nausea, early satiety or dyspepsia.  Denies any hematuria, frequency, dysuria or nocturia.  Denies any skin irritation, dryness or rash.  Denies any ecchymosis or petechiae.  Denies any lymphadenopathy or clotting.  Denies any heat or cold intolerance.  Denies any anxiety or depression.  Remaining review of system is negative.      Medications: I have reviewed the patient's current medications.  Current Outpatient Medications  Medication Sig Dispense Refill   . acetaminophen (TYLENOL) 500 MG tablet Take 500-1,000 mg by mouth at bedtime.    Marland Kitchen aspirin EC 81 MG tablet Take 81 mg by mouth daily.    . calcium carbonate (OS-CAL) 600 MG TABS Take 600 mg by mouth daily.     . ferrous sulfate 325 (65 FE) MG tablet Take 325 mg by mouth daily with breakfast.    . loratadine (CLARITIN) 10 MG tablet Take 10 mg by mouth daily as needed.     . Multiple Vitamins-Minerals (MULTIVITAMIN WITH MINERALS) tablet Take 1 tablet by mouth daily.    Marland Kitchen OVER THE COUNTER MEDICATION Apply topically as needed. Cream for arthritic pain. Patient unable to recall the name at this time.    Marland Kitchen oxybutynin (DITROPAN) 5 MG tablet Take 2.5 mg by mouth 2 (two) times daily.  2  . pramipexole (MIRAPEX) 0.25 MG tablet Take 0.25 mg by mouth at bedtime.    . primidone (MYSOLINE) 50 MG tablet Take 3 tablets (150 mg total) by mouth at bedtime. 270 tablet 4  . raloxifene (EVISTA) 60 MG tablet Take 60 mg by mouth at bedtime.     . timolol (TIMOPTIC) 0.5 % ophthalmic solution Place 1 drop into both eyes daily.   1  . UNABLE TO FIND Med Name: Aranesp infusions every 3 weeks administered by [facility]    . VYTORIN 10-20 MG tablet Take 1 tablet by mouth daily.  0   No current facility-administered medications for this visit.      Allergies:  Allergies  Allergen Reactions  . Mycostatin [Nystatin] Swelling and  Rash    Past Medical History, Surgical history, Social history, and Family History were reviewed and updated.  Physical Exam:    Blood pressure (!) 138/47, pulse 93, temperature 98.2 F (36.8 C), temperature source Oral, resp. rate 18, height 5\' 1"  (1.549 m), weight 142 lb 12.8 oz (64.8 kg), SpO2 98 %.     ECOG: 1     General appearance: Alert, awake without any distress. Head: Atraumatic without abnormalities Oropharynx: Without any thrush or ulcers. Eyes: No scleral icterus. Lymph nodes: No lymphadenopathy noted in the cervical, supraclavicular, or axillary  nodes Heart:regular rate and rhythm, without any murmurs or gallops.   Lung: Clear to auscultation without any rhonchi, wheezes or dullness to percussion. Abdomin: Soft, nontender without any shifting dullness or ascites. Musculoskeletal: No clubbing or cyanosis. Neurological: No motor or sensory deficits. Skin: No rashes or lesions.     Lab Results: Lab Results  Component Value Date   WBC 5.8 04/26/2018   HGB 8.8 (L) 04/26/2018   HCT 27.3 (L) 04/26/2018   MCV 101.5 (H) 04/26/2018   PLT 211 04/26/2018     Chemistry      Component Value Date/Time   NA 138 11/30/2017 1435   NA 135 (A) 04/01/2016   K 4.4 11/30/2017 1435   CL 103 11/30/2017 1435   CO2 30 11/30/2017 1435   BUN 21 11/30/2017 1435   BUN 19 04/01/2016   CREATININE 0.88 11/30/2017 1435   GLU 102 04/01/2016      Component Value Date/Time   CALCIUM 8.6 (L) 11/30/2017 1435   ALKPHOS 74 11/30/2017 1435   AST 17 11/30/2017 1435   ALT 16 11/30/2017 1435   BILITOT <0.2 (L) 11/30/2017 1435       Impression and Plan:  85 year old woman with  1.  Multifactorial anemia related to renal insufficiency as well as iron deficiency noted in 2018.     He continues to be on Aranesp to keep her hemoglobin above 11 and her chronic renal insufficiency.  Her hemoglobin has declined at this time and will receive Aranesp today.  Risks and benefits of continuing this therapy long-term was reviewed and she is agreeable to continue.  Issues with hypertension and venous thromboembolism were discussed.   Given her hemoglobin has drifted below 8, she would receive 1 unit of packed red cells in the next 24 to 48 hours.  2.  Iron deficiency: Iron studies repeated on January 30 showed normal counts and will be repeated today.  Risks and benefits of Feraheme infusion was reviewed today and pending iron studies she will likely require 1000 mg of Feraheme every 9 weeks.  She has been intolerant to oral iron with GI complaints.  I have  asked her to discontinue oral iron.    3.  Follow-up: In the next 24 to 48 hours to receive 1 unit of packed red cells she will also receive Feraheme on the same day.  She will receive Feraheme infusion a week from today and Aranesp in 3 weeks and 6 weeks.  She will have MD follow-up in 9 weeks from now.   15  minutes was spent with the patient face-to-face today.  More than 50% of time was dedicated to reviewing laboratory data, discussing treatment options and answering question regarding future plan of care.Eli Hose, MD 3/10/20202:57 PM

## 2018-05-17 ENCOUNTER — Ambulatory Visit: Payer: Medicare Other | Admitting: Oncology

## 2018-05-17 ENCOUNTER — Other Ambulatory Visit: Payer: Medicare Other

## 2018-05-17 ENCOUNTER — Ambulatory Visit: Payer: Medicare Other

## 2018-05-18 ENCOUNTER — Other Ambulatory Visit: Payer: Self-pay

## 2018-05-18 ENCOUNTER — Inpatient Hospital Stay: Payer: Medicare Other

## 2018-05-18 VITALS — BP 136/68 | HR 94 | Temp 98.7°F | Resp 18

## 2018-05-18 DIAGNOSIS — N189 Chronic kidney disease, unspecified: Secondary | ICD-10-CM | POA: Diagnosis not present

## 2018-05-18 DIAGNOSIS — D649 Anemia, unspecified: Secondary | ICD-10-CM

## 2018-05-18 DIAGNOSIS — Z79899 Other long term (current) drug therapy: Secondary | ICD-10-CM | POA: Diagnosis not present

## 2018-05-18 DIAGNOSIS — D631 Anemia in chronic kidney disease: Secondary | ICD-10-CM

## 2018-05-18 DIAGNOSIS — D509 Iron deficiency anemia, unspecified: Secondary | ICD-10-CM

## 2018-05-18 DIAGNOSIS — Z7982 Long term (current) use of aspirin: Secondary | ICD-10-CM | POA: Diagnosis not present

## 2018-05-18 DIAGNOSIS — I129 Hypertensive chronic kidney disease with stage 1 through stage 4 chronic kidney disease, or unspecified chronic kidney disease: Secondary | ICD-10-CM | POA: Diagnosis not present

## 2018-05-18 DIAGNOSIS — D638 Anemia in other chronic diseases classified elsewhere: Secondary | ICD-10-CM | POA: Diagnosis not present

## 2018-05-18 LAB — PREPARE RBC (CROSSMATCH)

## 2018-05-18 MED ORDER — SODIUM CHLORIDE 0.9 % IV SOLN
Freq: Once | INTRAVENOUS | Status: AC
  Administered 2018-05-18: 16:00:00 via INTRAVENOUS
  Filled 2018-05-18: qty 250

## 2018-05-18 MED ORDER — SODIUM CHLORIDE 0.9% FLUSH
3.0000 mL | INTRAVENOUS | Status: DC | PRN
  Filled 2018-05-18: qty 10

## 2018-05-18 MED ORDER — SODIUM CHLORIDE 0.9 % IV SOLN
510.0000 mg | Freq: Once | INTRAVENOUS | Status: AC
  Administered 2018-05-18: 510 mg via INTRAVENOUS
  Filled 2018-05-18: qty 17

## 2018-05-18 MED ORDER — HEPARIN SOD (PORK) LOCK FLUSH 100 UNIT/ML IV SOLN
500.0000 [IU] | Freq: Every day | INTRAVENOUS | Status: DC | PRN
  Filled 2018-05-18: qty 5

## 2018-05-18 MED ORDER — HEPARIN SOD (PORK) LOCK FLUSH 100 UNIT/ML IV SOLN
250.0000 [IU] | INTRAVENOUS | Status: DC | PRN
  Filled 2018-05-18: qty 5

## 2018-05-18 MED ORDER — SODIUM CHLORIDE 0.9% FLUSH
10.0000 mL | INTRAVENOUS | Status: DC | PRN
  Filled 2018-05-18: qty 10

## 2018-05-18 MED ORDER — SODIUM CHLORIDE 0.9% IV SOLUTION
250.0000 mL | Freq: Once | INTRAVENOUS | Status: DC
  Filled 2018-05-18: qty 250

## 2018-05-18 NOTE — Patient Instructions (Signed)
Ferumoxytol injection What is this medicine? FERUMOXYTOL is an iron complex. Iron is used to make healthy red blood cells, which carry oxygen and nutrients throughout the body. This medicine is used to treat iron deficiency anemia. This medicine may be used for other purposes; ask your health care provider or pharmacist if you have questions. COMMON BRAND NAME(S): Feraheme What should I tell my health care provider before I take this medicine? They need to know if you have any of these conditions: -anemia not caused by low iron levels -high levels of iron in the blood -magnetic resonance imaging (MRI) test scheduled -an unusual or allergic reaction to iron, other medicines, foods, dyes, or preservatives -pregnant or trying to get pregnant -breast-feeding How should I use this medicine? This medicine is for injection into a vein. It is given by a health care professional in a hospital or clinic setting. Talk to your pediatrician regarding the use of this medicine in children. Special care may be needed. Overdosage: If you think you have taken too much of this medicine contact a poison control center or emergency room at once. NOTE: This medicine is only for you. Do not share this medicine with others. What if I miss a dose? It is important not to miss your dose. Call your doctor or health care professional if you are unable to keep an appointment. What may interact with this medicine? This medicine may interact with the following medications: -other iron products This list may not describe all possible interactions. Give your health care provider a list of all the medicines, herbs, non-prescription drugs, or dietary supplements you use. Also tell them if you smoke, drink alcohol, or use illegal drugs. Some items may interact with your medicine. What should I watch for while using this medicine? Visit your doctor or healthcare professional regularly. Tell your doctor or healthcare professional  if your symptoms do not start to get better or if they get worse. You may need blood work done while you are taking this medicine. You may need to follow a special diet. Talk to your doctor. Foods that contain iron include: whole grains/cereals, dried fruits, beans, or peas, leafy green vegetables, and organ meats (liver, kidney). What side effects may I notice from receiving this medicine? Side effects that you should report to your doctor or health care professional as soon as possible: -allergic reactions like skin rash, itching or hives, swelling of the face, lips, or tongue -breathing problems -changes in blood pressure -feeling faint or lightheaded, falls -fever or chills -flushing, sweating, or hot feelings -swelling of the ankles or feet Side effects that usually do not require medical attention (report to your doctor or health care professional if they continue or are bothersome): -diarrhea -headache -nausea, vomiting -stomach pain This list may not describe all possible side effects. Call your doctor for medical advice about side effects. You may report side effects to FDA at 1-800-FDA-1088. Where should I keep my medicine? This drug is given in a hospital or clinic and will not be stored at home. NOTE: This sheet is a summary. It may not cover all possible information. If you have questions about this medicine, talk to your doctor, pharmacist, or health care provider.  2019 Elsevier/Gold Standard (2016-04-12 20:21:10)   Blood Transfusion, Adult, Care After This sheet gives you information about how to care for yourself after your procedure. Your doctor may also give you more specific instructions. If you have problems or questions, contact your doctor. Follow these instructions   at home:   Take over-the-counter and prescription medicines only as told by your doctor.  Go back to your normal activities as told by your doctor.  Follow instructions from your doctor about how to  take care of the area where an IV tube was put into your vein (insertion site). Make sure you: ? Wash your hands with soap and water before you change your bandage (dressing). If there is no soap and water, use hand sanitizer. ? Change your bandage as told by your doctor.  Check your IV insertion site every day for signs of infection. Check for: ? More redness, swelling, or pain. ? More fluid or blood. ? Warmth. ? Pus or a bad smell. Contact a doctor if:  You have more redness, swelling, or pain around the IV insertion site.  You have more fluid or blood coming from the IV insertion site.  Your IV insertion site feels warm to the touch.  You have pus or a bad smell coming from the IV insertion site.  Your pee (urine) turns pink, red, or brown.  You feel weak after doing your normal activities. Get help right away if:  You have signs of a serious allergic or body defense (immune) system reaction, including: ? Itchiness. ? Hives. ? Trouble breathing. ? Anxiety. ? Pain in your chest or lower back. ? Fever, flushing, and chills. ? Fast pulse. ? Rash. ? Watery poop (diarrhea). ? Throwing up (vomiting). ? Dark pee. ? Serious headache. ? Dizziness. ? Stiff neck. ? Yellow color in your face or the white parts of your eyes (jaundice). Summary  After a blood transfusion, return to your normal activities as told by your doctor.  Every day, check for signs of infection where the IV tube was put into your vein.  Some signs of infection are warm skin, more redness and pain, more fluid or blood, and pus or a bad smell where the needle went in.  Contact your doctor if you feel weak or have any unusual symptoms. This information is not intended to replace advice given to you by your health care provider. Make sure you discuss any questions you have with your health care provider. Document Released: 03/15/2014 Document Revised: 10/17/2015 Document Reviewed: 10/17/2015 Elsevier  Interactive Patient Education  2019 Elsevier Inc.  

## 2018-05-18 NOTE — Progress Notes (Signed)
VO from [place] to give iron before blood transfusion since it's ready first.  Pt brought her own premeds from home and took them before starting iron or blood transfusion- 650 PO tylenol and 25 PO benadryl.  Daughter present for infusion.  Report given to [facility], pt transferred from [facility] to infusion area with belongings and daughter.

## 2018-05-19 LAB — BPAM RBC
Blood Product Expiration Date: 202004092359
ISSUE DATE / TIME: 202003121704
Unit Type and Rh: 6200

## 2018-05-19 LAB — TYPE AND SCREEN
ABO/RH(D): A POS
Antibody Screen: NEGATIVE
Unit division: 0

## 2018-05-23 ENCOUNTER — Ambulatory Visit: Payer: Medicare Other

## 2018-05-24 ENCOUNTER — Other Ambulatory Visit: Payer: Self-pay

## 2018-05-24 ENCOUNTER — Inpatient Hospital Stay: Payer: Medicare Other

## 2018-05-24 VITALS — BP 148/56 | HR 89 | Temp 98.8°F | Resp 16

## 2018-05-24 DIAGNOSIS — D509 Iron deficiency anemia, unspecified: Secondary | ICD-10-CM

## 2018-05-24 DIAGNOSIS — I129 Hypertensive chronic kidney disease with stage 1 through stage 4 chronic kidney disease, or unspecified chronic kidney disease: Secondary | ICD-10-CM | POA: Diagnosis not present

## 2018-05-24 DIAGNOSIS — Z7982 Long term (current) use of aspirin: Secondary | ICD-10-CM | POA: Diagnosis not present

## 2018-05-24 DIAGNOSIS — N189 Chronic kidney disease, unspecified: Principal | ICD-10-CM

## 2018-05-24 DIAGNOSIS — D631 Anemia in chronic kidney disease: Secondary | ICD-10-CM

## 2018-05-24 DIAGNOSIS — D649 Anemia, unspecified: Secondary | ICD-10-CM

## 2018-05-24 DIAGNOSIS — D638 Anemia in other chronic diseases classified elsewhere: Secondary | ICD-10-CM | POA: Diagnosis not present

## 2018-05-24 DIAGNOSIS — Z79899 Other long term (current) drug therapy: Secondary | ICD-10-CM | POA: Diagnosis not present

## 2018-05-24 MED ORDER — SODIUM CHLORIDE 0.9 % IV SOLN
510.0000 mg | Freq: Once | INTRAVENOUS | Status: AC
  Administered 2018-05-24: 510 mg via INTRAVENOUS
  Filled 2018-05-24: qty 17

## 2018-05-24 MED ORDER — SODIUM CHLORIDE 0.9 % IV SOLN
Freq: Once | INTRAVENOUS | Status: AC
  Administered 2018-05-24: 15:00:00 via INTRAVENOUS
  Filled 2018-05-24: qty 250

## 2018-05-24 NOTE — Patient Instructions (Signed)

## 2018-06-07 ENCOUNTER — Other Ambulatory Visit: Payer: Self-pay

## 2018-06-07 ENCOUNTER — Inpatient Hospital Stay: Payer: Medicare Other

## 2018-06-07 ENCOUNTER — Inpatient Hospital Stay: Payer: Medicare Other | Attending: Oncology

## 2018-06-07 VITALS — BP 130/47 | HR 98 | Temp 98.6°F | Resp 18

## 2018-06-07 DIAGNOSIS — D649 Anemia, unspecified: Secondary | ICD-10-CM

## 2018-06-07 DIAGNOSIS — I129 Hypertensive chronic kidney disease with stage 1 through stage 4 chronic kidney disease, or unspecified chronic kidney disease: Secondary | ICD-10-CM | POA: Insufficient documentation

## 2018-06-07 DIAGNOSIS — D631 Anemia in chronic kidney disease: Secondary | ICD-10-CM

## 2018-06-07 DIAGNOSIS — N189 Chronic kidney disease, unspecified: Secondary | ICD-10-CM | POA: Insufficient documentation

## 2018-06-07 DIAGNOSIS — Z79899 Other long term (current) drug therapy: Secondary | ICD-10-CM | POA: Diagnosis not present

## 2018-06-07 DIAGNOSIS — D509 Iron deficiency anemia, unspecified: Secondary | ICD-10-CM | POA: Insufficient documentation

## 2018-06-07 LAB — CBC WITH DIFFERENTIAL (CANCER CENTER ONLY)
Abs Immature Granulocytes: 0.03 10*3/uL (ref 0.00–0.07)
Basophils Absolute: 0 10*3/uL (ref 0.0–0.1)
Basophils Relative: 1 %
Eosinophils Absolute: 0.1 10*3/uL (ref 0.0–0.5)
Eosinophils Relative: 1 %
HCT: 32.3 % — ABNORMAL LOW (ref 36.0–46.0)
Hemoglobin: 10.2 g/dL — ABNORMAL LOW (ref 12.0–15.0)
Immature Granulocytes: 1 %
Lymphocytes Relative: 8 %
Lymphs Abs: 0.5 10*3/uL — ABNORMAL LOW (ref 0.7–4.0)
MCH: 32.1 pg (ref 26.0–34.0)
MCHC: 31.6 g/dL (ref 30.0–36.0)
MCV: 101.6 fL — ABNORMAL HIGH (ref 80.0–100.0)
Monocytes Absolute: 0.6 10*3/uL (ref 0.1–1.0)
Monocytes Relative: 10 %
Neutro Abs: 5.1 10*3/uL (ref 1.7–7.7)
Neutrophils Relative %: 79 %
Platelet Count: 229 10*3/uL (ref 150–400)
RBC: 3.18 MIL/uL — ABNORMAL LOW (ref 3.87–5.11)
RDW: 16.5 % — ABNORMAL HIGH (ref 11.5–15.5)
WBC Count: 6.4 10*3/uL (ref 4.0–10.5)
nRBC: 0 % (ref 0.0–0.2)

## 2018-06-07 LAB — SAMPLE TO BLOOD BANK

## 2018-06-07 MED ORDER — DARBEPOETIN ALFA 300 MCG/0.6ML IJ SOSY
300.0000 ug | PREFILLED_SYRINGE | Freq: Once | INTRAMUSCULAR | Status: AC
  Administered 2018-06-07: 300 ug via SUBCUTANEOUS

## 2018-06-07 MED ORDER — DARBEPOETIN ALFA 300 MCG/0.6ML IJ SOSY
PREFILLED_SYRINGE | INTRAMUSCULAR | Status: AC
  Filled 2018-06-07: qty 0.6

## 2018-06-07 NOTE — Patient Instructions (Signed)

## 2018-06-28 ENCOUNTER — Inpatient Hospital Stay: Payer: Medicare Other

## 2018-06-28 ENCOUNTER — Other Ambulatory Visit: Payer: Self-pay

## 2018-06-28 VITALS — BP 138/52 | HR 88 | Temp 98.6°F | Resp 17

## 2018-06-28 DIAGNOSIS — D631 Anemia in chronic kidney disease: Secondary | ICD-10-CM | POA: Diagnosis not present

## 2018-06-28 DIAGNOSIS — N189 Chronic kidney disease, unspecified: Secondary | ICD-10-CM

## 2018-06-28 DIAGNOSIS — D649 Anemia, unspecified: Secondary | ICD-10-CM

## 2018-06-28 DIAGNOSIS — D509 Iron deficiency anemia, unspecified: Secondary | ICD-10-CM

## 2018-06-28 DIAGNOSIS — I129 Hypertensive chronic kidney disease with stage 1 through stage 4 chronic kidney disease, or unspecified chronic kidney disease: Secondary | ICD-10-CM | POA: Diagnosis not present

## 2018-06-28 DIAGNOSIS — Z79899 Other long term (current) drug therapy: Secondary | ICD-10-CM | POA: Diagnosis not present

## 2018-06-28 LAB — CBC WITH DIFFERENTIAL (CANCER CENTER ONLY)
Abs Immature Granulocytes: 0.02 10*3/uL (ref 0.00–0.07)
Basophils Absolute: 0 10*3/uL (ref 0.0–0.1)
Basophils Relative: 0 %
Eosinophils Absolute: 0.1 10*3/uL (ref 0.0–0.5)
Eosinophils Relative: 1 %
HCT: 29.2 % — ABNORMAL LOW (ref 36.0–46.0)
Hemoglobin: 9 g/dL — ABNORMAL LOW (ref 12.0–15.0)
Immature Granulocytes: 0 %
Lymphocytes Relative: 9 %
Lymphs Abs: 0.6 10*3/uL — ABNORMAL LOW (ref 0.7–4.0)
MCH: 31.7 pg (ref 26.0–34.0)
MCHC: 30.8 g/dL (ref 30.0–36.0)
MCV: 102.8 fL — ABNORMAL HIGH (ref 80.0–100.0)
Monocytes Absolute: 0.7 10*3/uL (ref 0.1–1.0)
Monocytes Relative: 10 %
Neutro Abs: 5.5 10*3/uL (ref 1.7–7.7)
Neutrophils Relative %: 80 %
Platelet Count: 228 10*3/uL (ref 150–400)
RBC: 2.84 MIL/uL — ABNORMAL LOW (ref 3.87–5.11)
RDW: 15.7 % — ABNORMAL HIGH (ref 11.5–15.5)
WBC Count: 6.9 10*3/uL (ref 4.0–10.5)
nRBC: 0 % (ref 0.0–0.2)

## 2018-06-28 LAB — IRON AND TIBC
Iron: 28 ug/dL — ABNORMAL LOW (ref 41–142)
Saturation Ratios: 13 % — ABNORMAL LOW (ref 21–57)
TIBC: 215 ug/dL — ABNORMAL LOW (ref 236–444)
UIBC: 187 ug/dL (ref 120–384)

## 2018-06-28 LAB — FERRITIN: Ferritin: 47 ng/mL (ref 11–307)

## 2018-06-28 LAB — SAMPLE TO BLOOD BANK

## 2018-06-28 MED ORDER — DARBEPOETIN ALFA 300 MCG/0.6ML IJ SOSY
300.0000 ug | PREFILLED_SYRINGE | Freq: Once | INTRAMUSCULAR | Status: AC
  Administered 2018-06-28: 300 ug via SUBCUTANEOUS

## 2018-06-28 NOTE — Patient Instructions (Signed)

## 2018-07-11 DIAGNOSIS — Q6689 Other  specified congenital deformities of feet: Secondary | ICD-10-CM | POA: Diagnosis not present

## 2018-07-11 DIAGNOSIS — M79672 Pain in left foot: Secondary | ICD-10-CM | POA: Diagnosis not present

## 2018-07-11 DIAGNOSIS — M79671 Pain in right foot: Secondary | ICD-10-CM | POA: Diagnosis not present

## 2018-07-11 DIAGNOSIS — B351 Tinea unguium: Secondary | ICD-10-CM | POA: Diagnosis not present

## 2018-07-18 ENCOUNTER — Inpatient Hospital Stay: Payer: Medicare Other

## 2018-07-18 ENCOUNTER — Other Ambulatory Visit: Payer: Self-pay

## 2018-07-18 ENCOUNTER — Encounter: Payer: Self-pay | Admitting: Oncology

## 2018-07-18 ENCOUNTER — Inpatient Hospital Stay: Payer: Medicare Other | Attending: Oncology | Admitting: Oncology

## 2018-07-18 VITALS — BP 142/54 | HR 100 | Temp 99.8°F | Resp 17 | Ht 61.0 in | Wt 139.7 lb

## 2018-07-18 VITALS — BP 139/47 | HR 108 | Temp 99.6°F | Resp 18

## 2018-07-18 DIAGNOSIS — D509 Iron deficiency anemia, unspecified: Secondary | ICD-10-CM | POA: Insufficient documentation

## 2018-07-18 DIAGNOSIS — D631 Anemia in chronic kidney disease: Secondary | ICD-10-CM

## 2018-07-18 DIAGNOSIS — N189 Chronic kidney disease, unspecified: Secondary | ICD-10-CM | POA: Insufficient documentation

## 2018-07-18 DIAGNOSIS — Z79899 Other long term (current) drug therapy: Secondary | ICD-10-CM | POA: Diagnosis not present

## 2018-07-18 DIAGNOSIS — I129 Hypertensive chronic kidney disease with stage 1 through stage 4 chronic kidney disease, or unspecified chronic kidney disease: Secondary | ICD-10-CM

## 2018-07-18 DIAGNOSIS — D649 Anemia, unspecified: Secondary | ICD-10-CM

## 2018-07-18 DIAGNOSIS — Z7982 Long term (current) use of aspirin: Secondary | ICD-10-CM | POA: Diagnosis not present

## 2018-07-18 LAB — SAMPLE TO BLOOD BANK

## 2018-07-18 LAB — CBC WITH DIFFERENTIAL (CANCER CENTER ONLY)
Abs Immature Granulocytes: 0.02 10*3/uL (ref 0.00–0.07)
Basophils Absolute: 0 10*3/uL (ref 0.0–0.1)
Basophils Relative: 0 %
Eosinophils Absolute: 0.1 10*3/uL (ref 0.0–0.5)
Eosinophils Relative: 1 %
HCT: 23.6 % — ABNORMAL LOW (ref 36.0–46.0)
Hemoglobin: 7.1 g/dL — ABNORMAL LOW (ref 12.0–15.0)
Immature Granulocytes: 0 %
Lymphocytes Relative: 9 %
Lymphs Abs: 0.6 10*3/uL — ABNORMAL LOW (ref 0.7–4.0)
MCH: 29.1 pg (ref 26.0–34.0)
MCHC: 30.1 g/dL (ref 30.0–36.0)
MCV: 96.7 fL (ref 80.0–100.0)
Monocytes Absolute: 0.9 10*3/uL (ref 0.1–1.0)
Monocytes Relative: 13 %
Neutro Abs: 5.4 10*3/uL (ref 1.7–7.7)
Neutrophils Relative %: 77 %
Platelet Count: 240 10*3/uL (ref 150–400)
RBC: 2.44 MIL/uL — ABNORMAL LOW (ref 3.87–5.11)
RDW: 15.9 % — ABNORMAL HIGH (ref 11.5–15.5)
WBC Count: 7 10*3/uL (ref 4.0–10.5)
nRBC: 0 % (ref 0.0–0.2)

## 2018-07-18 LAB — PREPARE RBC (CROSSMATCH)

## 2018-07-18 MED ORDER — DARBEPOETIN ALFA 300 MCG/0.6ML IJ SOSY
300.0000 ug | PREFILLED_SYRINGE | Freq: Once | INTRAMUSCULAR | Status: AC
  Administered 2018-07-18: 300 ug via SUBCUTANEOUS

## 2018-07-18 MED ORDER — DARBEPOETIN ALFA 300 MCG/0.6ML IJ SOSY
PREFILLED_SYRINGE | INTRAMUSCULAR | Status: AC
  Filled 2018-07-18: qty 0.6

## 2018-07-18 NOTE — Patient Instructions (Signed)

## 2018-07-18 NOTE — Progress Notes (Signed)
Hematology and Oncology Follow Up Visit  Lisa [name] [id number] [birth year] 85 y.o. 07/18/2018 3:43 PM [name], Lisa, MDBarnes, Lisa ManisElizabeth, MD   Principle Diagnosis: 85 year old woman with multifactorial anemia due to renal insufficiency as well as iron deficiency diagnosed in 2018.       Prior Therapy: IV iron infusion completed in November 2018.  She is also status post post packed red cell transfusion  Current therapy:   Aranesp 300 mcg every 3 weeks    Feraheme at 1000 mg every 9 weeks.  Supportive packed red cell transfusion for hemoglobin below 8.  She has been receiving 1 to 2 units every 9 weeks.  Interim History: Lisa [name] is here for a follow-up visit.  Since the last visit, she reports no major changes in her health.  She continues to receive supportive care utilizing Aranesp, packed red cells and intravenous iron infusion.  She tolerated the last infusion without any major complications.  She denies any chest pain or difficulty breathing.  She denies any excessive fatigue or tiredness.  She denies any recent falls or syncope.  She continues to ambulate with the help of a walker without any recent falls.  Patient denied headaches, blurry vision, syncope or seizures.  Denies any fevers, chills or sweats.  Denied chest pain, palpitation, orthopnea or leg edema.  Denied cough, wheezing or hemoptysis.  Denied nausea, vomiting or abdominal pain.  Denies any constipation or diarrhea.  Denies any frequency urgency or hesitancy.  Denies any arthralgias or myalgias.  Denies any skin rashes or lesions.  Denies any bleeding or clotting tendency.  Denies any easy bruising.  Denies any hair or nail changes.  Denies any anxiety or depression.  Remaining review of system is negative.       Medications: I have reviewed the patient's current medications.  Current Outpatient Medications  Medication Sig Dispense Refill  . acetaminophen (TYLENOL) 500 MG tablet Take 500-1,000 mg by mouth  at bedtime.    Marland Kitchen. aspirin EC 81 MG tablet Take 81 mg by mouth daily.    . calcium carbonate (OS-CAL) 600 MG TABS Take 600 mg by mouth daily.     . ferrous sulfate 325 (65 FE) MG tablet Take 325 mg by mouth daily with breakfast.    . loratadine (CLARITIN) 10 MG tablet Take 10 mg by mouth daily as needed.     . Multiple Vitamins-Minerals (MULTIVITAMIN WITH MINERALS) tablet Take 1 tablet by mouth daily.    Marland Kitchen. OVER THE COUNTER MEDICATION Apply topically as needed. Cream for arthritic pain. Patient unable to recall the name at this time.    Marland Kitchen. oxybutynin (DITROPAN) 5 MG tablet Take 2.5 mg by mouth 2 (two) times daily.  2  . pramipexole (MIRAPEX) 0.25 MG tablet Take 0.25 mg by mouth at bedtime.    . primidone (MYSOLINE) 50 MG tablet Take 3 tablets (150 mg total) by mouth at bedtime. 270 tablet 4  . raloxifene (EVISTA) 60 MG tablet Take 60 mg by mouth at bedtime.     . timolol (TIMOPTIC) 0.5 % ophthalmic solution Place 1 drop into both eyes daily.   1  . UNABLE TO FIND Med Name: [facility]    . VYTORIN 10-20 MG tablet Take 1 tablet by mouth daily.  0   No current facility-administered medications for this visit.      Allergies:  Allergies  Allergen Reactions  . Mycostatin [Nystatin] Swelling and Rash  Past Medical History, Surgical history, Social history, and Family History were reviewed and updated.  Physical Exam:    Blood pressure (!) 142/54, pulse 100, temperature 99.8 F (37.7 C), temperature source Oral, resp. rate 17, height 5\' 1"  (1.549 m), weight 139 lb 11.2 oz (63.4 kg), SpO2 98 %.     ECOG: 1    General appearance: Alert, awake without any distress. Head: Atraumatic without abnormalities Oropharynx: Without any thrush or ulcers. Eyes: No scleral icterus. Lymph nodes: No lymphadenopathy noted in the cervical, supraclavicular, or axillary nodes Heart:regular rate and rhythm, without any murmurs or gallops.    Lung: Clear to auscultation without any rhonchi, wheezes or dullness to percussion. Abdomin: Soft, nontender without any shifting dullness or ascites. Musculoskeletal: No clubbing or cyanosis. Neurological: No motor or sensory deficits. Skin: No rashes or lesions.      Lab Results: Lab Results  Component Value Date   WBC 7.0 07/18/2018   HGB 7.1 (L) 07/18/2018   HCT 23.6 (L) 07/18/2018   MCV 96.7 07/18/2018   PLT 240 07/18/2018     Chemistry      Component Value Date/Time   NA 138 11/30/2017 1435   NA 135 (A) 04/01/2016   K 4.4 11/30/2017 1435   CL 103 11/30/2017 1435   CO2 30 11/30/2017 1435   BUN 21 11/30/2017 1435   BUN 19 04/01/2016   CREATININE 0.88 11/30/2017 1435   GLU 102 04/01/2016      Component Value Date/Time   CALCIUM 8.6 (L) 11/30/2017 1435   ALKPHOS 74 11/30/2017 1435   AST 17 11/30/2017 1435   ALT 16 11/30/2017 1435   BILITOT <0.2 (L) 11/30/2017 1435       Impression and Plan:  85 year old woman with  1.  Anemia diagnosed in 2018.  This was related to chronically insufficiency as well as iron deficiency.       The natural course of this disease was reviewed today with the patient as well as her daughter over the phone.  Her hemoglobin today is declining and will likely require packed red cell transfusion as well as intravenous iron indefinitely.  Risks and benefits of this approach was reviewed today and she is agreeable to proceed.  She will receive Aranesp today and repeated every 3 weeks.  2.  Iron deficiency: Repeat iron studies obtained on June 28, 2018 showed decline of her iron level to 28 with saturation of 13%.  I advised her to proceed with Feraheme infusion which will be given on split doses starting on Jul 19, 2018 with a repeat infusion a week later.  Complications including arthralgias, myalgias infusion related complications.  He is agreeable to proceed at this time.    3.  Follow-up: We will be on Jul 19, 2018 for 1 unit of  packed red cells, Feraheme infusion.  She will return in 1 week for another Feraheme infusion.  She will have Aranesp in 3 weeks, 6 weeks and MD follow-up in 9 weeks.   25  minutes was spent with the patient face-to-face today.  More than 50% of time was spent on reviewing her laboratory data, treatment strategies, complications related to therapy as well as coordinating her future plan of care with her as well as her daughter via phone.      Eli Hose, MD 5/12/20203:43 PM

## 2018-07-19 ENCOUNTER — Inpatient Hospital Stay: Payer: Medicare Other

## 2018-07-19 ENCOUNTER — Other Ambulatory Visit: Payer: Self-pay

## 2018-07-19 VITALS — BP 124/48 | HR 79 | Temp 98.7°F | Resp 16

## 2018-07-19 DIAGNOSIS — Z7982 Long term (current) use of aspirin: Secondary | ICD-10-CM | POA: Diagnosis not present

## 2018-07-19 DIAGNOSIS — D631 Anemia in chronic kidney disease: Secondary | ICD-10-CM | POA: Diagnosis not present

## 2018-07-19 DIAGNOSIS — I129 Hypertensive chronic kidney disease with stage 1 through stage 4 chronic kidney disease, or unspecified chronic kidney disease: Secondary | ICD-10-CM | POA: Diagnosis not present

## 2018-07-19 DIAGNOSIS — D509 Iron deficiency anemia, unspecified: Secondary | ICD-10-CM | POA: Diagnosis not present

## 2018-07-19 DIAGNOSIS — Z79899 Other long term (current) drug therapy: Secondary | ICD-10-CM | POA: Diagnosis not present

## 2018-07-19 DIAGNOSIS — D649 Anemia, unspecified: Secondary | ICD-10-CM

## 2018-07-19 DIAGNOSIS — N189 Chronic kidney disease, unspecified: Secondary | ICD-10-CM | POA: Diagnosis not present

## 2018-07-19 LAB — PREPARE RBC (CROSSMATCH)

## 2018-07-19 MED ORDER — SODIUM CHLORIDE 0.9% IV SOLUTION
250.0000 mL | Freq: Once | INTRAVENOUS | Status: AC
  Administered 2018-07-19: 250 mL via INTRAVENOUS
  Filled 2018-07-19: qty 250

## 2018-07-19 MED ORDER — SODIUM CHLORIDE 0.9 % IV SOLN
510.0000 mg | Freq: Once | INTRAVENOUS | Status: AC
  Administered 2018-07-19: 13:00:00 510 mg via INTRAVENOUS
  Filled 2018-07-19: qty 17

## 2018-07-19 MED ORDER — SODIUM CHLORIDE 0.9 % IV SOLN
Freq: Once | INTRAVENOUS | Status: AC
  Administered 2018-07-19: 14:00:00 via INTRAVENOUS
  Filled 2018-07-19: qty 250

## 2018-07-19 NOTE — Patient Instructions (Signed)
Blood Transfusion, Adult  A blood transfusion is a procedure in which you receive donated blood, including plasma, platelets, and red blood cells, through an IV tube. You may need a blood transfusion because of illness, surgery, or injury. The blood may come from a donor. You may also be able to donate blood for yourself (autologous blood donation) before a surgery if you know that you might require a blood transfusion. The blood given in a transfusion is made up of different types of cells. You may receive:  Red blood cells. These carry oxygen to the cells in the body.  White blood cells. These help you fight infections.  Platelets. These help your blood to clot.  Plasma. This is the liquid part of your blood and it helps with fluid imbalances. If you have hemophilia or another clotting disorder, you may also receive other types of blood products. Tell a health care provider about:  Any allergies you have.  All medicines you are taking, including vitamins, herbs, eye drops, creams, and over-the-counter medicines.  Any problems you or family members have had with anesthetic medicines.  Any blood disorders you have.  Any surgeries you have had.  Any medical conditions you have, including any recent fever or cold symptoms.  Whether you are pregnant or may be pregnant.  Any previous reactions you have had during a blood transfusion. What are the risks? Generally, this is a safe procedure. However, problems may occur, including:  Having an allergic reaction to something in the donated blood. Hives and itching may be symptoms of this type of reaction.  Fever. This may be a reaction to the white blood cells in the transfused blood. Nausea or chest pain may accompany a fever.  Iron overload. This can happen from having many transfusions.  Transfusion-related acute lung injury (TRALI). This is a rare reaction that causes lung damage. The cause is not known.TRALI can occur within hours  of a transfusion or several days later.  Sudden (acute) or delayed hemolytic reactions. This happens if your blood does not match the cells in your transfusion. Your body's defense system (immune system) may try to attack the new cells. This complication is rare. The symptoms include fever, chills, nausea, and low back pain or chest pain.  Infection or disease transmission. This is rare. What happens before the procedure?  You will have a blood test to determine your blood type. This is necessary to know what kind of blood your body will accept and to match it to the donor blood.  If you are going to have a planned surgery, you may be able to do an autologous blood donation. This may be done in case you need to have a transfusion.  If you have had an allergic reaction to a transfusion in the past, you may be given medicine to help prevent a reaction. This medicine may be given to you by mouth or through an IV tube.  You will have your temperature, blood pressure, and pulse monitored before the transfusion.  Follow instructions from your health care provider about eating and drinking restrictions.  Ask your health care provider about: ? Changing or stopping your regular medicines. This is especially important if you are taking diabetes medicines or blood thinners. ? Taking medicines such as aspirin and ibuprofen. These medicines can thin your blood. Do not take these medicines before your procedure if your health care provider instructs you not to. What happens during the procedure?  An IV tube will be   inserted into one of your veins.  The bag of donated blood will be attached to your IV tube. The blood will then enter through your vein.  Your temperature, blood pressure, and pulse will be monitored regularly during the transfusion. This monitoring is done to detect early signs of a transfusion reaction.  If you have any signs or symptoms of a reaction, your transfusion will be stopped and  you may be given medicine.  When the transfusion is complete, your IV tube will be removed.  Pressure may be applied to the IV site for a few minutes.  A bandage (dressing) will be applied. The procedure may vary among health care providers and hospitals. What happens after the procedure?  Your temperature, blood pressure, heart rate, breathing rate, and blood oxygen level will be monitored often.  Your blood may be tested to see how you are responding to the transfusion.  You may be warmed with fluids or blankets to maintain a normal body temperature. Summary  A blood transfusion is a procedure in which you receive donated blood, including plasma, platelets, and red blood cells, through an IV tube.  Your temperature, blood pressure, and pulse will be monitored before, during, and after the transfusion.  Your blood may be tested after the transfusion to see how your body has responded. This information is not intended to replace advice given to you by your health care provider. Make sure you discuss any questions you have with your health care provider. Document Released: 02/20/2000 Document Revised: 11/20/2015 Document Reviewed: 11/20/2015 Elsevier Interactive Patient Education  2019 [employer]    Ferumoxytol injection What is this medicine? FERUMOXYTOL is an iron complex. Iron is used to make healthy red blood cells, which carry oxygen and nutrients throughout the body. This medicine is used to treat iron deficiency anemia. This medicine may be used for other purposes; ask your health care provider or pharmacist if you have questions. COMMON BRAND NAME(S): Feraheme What should I tell my health care provider before I take this medicine? They need to know if you have any of these conditions: -anemia not caused by low iron levels -high levels of iron in the blood -magnetic resonance imaging (MRI) test scheduled -an unusual or allergic reaction to iron, other medicines, foods,  dyes, or preservatives -pregnant or trying to get pregnant -breast-feeding How should I use this medicine? This medicine is for injection into a vein. It is given by a health care professional in a hospital or clinic setting. Talk to your pediatrician regarding the use of this medicine in children. Special care may be needed. Overdosage: If you think you have taken too much of this medicine contact a poison control center or emergency room at once. NOTE: This medicine is only for you. Do not share this medicine with others. What if I miss a dose? It is important not to miss your dose. Call your doctor or health care professional if you are unable to keep an appointment. What may interact with this medicine? This medicine may interact with the following medications: -other iron products This list may not describe all possible interactions. Give your health care provider a list of all the medicines, herbs, non-prescription drugs, or dietary supplements you use. Also tell them if you smoke, drink alcohol, or use illegal drugs. Some items may interact with your medicine. What should I watch for while using this medicine? Visit your doctor or healthcare professional regularly. Tell your doctor or healthcare professional if your symptoms do  not start to get better or if they get worse. You may need blood work done while you are taking this medicine. You may need to follow a special diet. Talk to your doctor. Foods that contain iron include: whole grains/cereals, dried fruits, beans, or peas, leafy green vegetables, and organ meats (liver, kidney). What side effects may I notice from receiving this medicine? Side effects that you should report to your doctor or health care professional as soon as possible: -allergic reactions like skin rash, itching or hives, swelling of the face, lips, or tongue -breathing problems -changes in blood pressure -feeling faint or lightheaded, falls -fever or chills  -flushing, sweating, or hot feelings -swelling of the ankles or feet Side effects that usually do not require medical attention (report to your doctor or health care professional if they continue or are bothersome): -diarrhea -headache -nausea, vomiting -stomach pain This list may not describe all possible side effects. Call your doctor for medical advice about side effects. You may report side effects to FDA at 1-800-FDA-1088. Where should I keep my medicine? This drug is given in a hospital or clinic and will not be stored at home. NOTE: This sheet is a summary. It may not cover all possible information. If you have questions about this medicine, talk to your doctor, pharmacist, or health care provider.  2019 Elsevier/Gold Standard (2016-04-12 20:21:10)  COVID-19 Labs  No results for input(s): DDIMER, FERRITIN, LDH, CRP in the last 72 hours.  No results found for: SARSCOV2NAA

## 2018-07-20 LAB — BPAM RBC
Blood Product Expiration Date: 202005292359
ISSUE DATE / TIME: 202005131334
Unit Type and Rh: 6200

## 2018-07-20 LAB — TYPE AND SCREEN
ABO/RH(D): A POS
Antibody Screen: NEGATIVE
Unit division: 0

## 2018-07-21 ENCOUNTER — Telehealth: Payer: Self-pay

## 2018-07-21 ENCOUNTER — Telehealth: Payer: Self-pay | Admitting: Oncology

## 2018-07-21 NOTE — Telephone Encounter (Signed)
Called regarding schedule change per daughter

## 2018-07-21 NOTE — Telephone Encounter (Signed)
Received call from patient daughter requesting f/u call since all appointments had not been scheduled and also requested that no appt's be scheduled for 7/14. Scheduling message sent with above information. Daughter also requested last Ferritin level and this information was provided as well.

## 2018-07-26 ENCOUNTER — Inpatient Hospital Stay: Payer: Medicare Other

## 2018-07-26 ENCOUNTER — Other Ambulatory Visit: Payer: Self-pay

## 2018-07-26 VITALS — BP 117/49 | HR 89 | Temp 98.9°F | Resp 18

## 2018-07-26 DIAGNOSIS — D631 Anemia in chronic kidney disease: Secondary | ICD-10-CM | POA: Diagnosis not present

## 2018-07-26 DIAGNOSIS — D509 Iron deficiency anemia, unspecified: Secondary | ICD-10-CM | POA: Diagnosis not present

## 2018-07-26 DIAGNOSIS — Z7982 Long term (current) use of aspirin: Secondary | ICD-10-CM | POA: Diagnosis not present

## 2018-07-26 DIAGNOSIS — D649 Anemia, unspecified: Secondary | ICD-10-CM

## 2018-07-26 DIAGNOSIS — I129 Hypertensive chronic kidney disease with stage 1 through stage 4 chronic kidney disease, or unspecified chronic kidney disease: Secondary | ICD-10-CM | POA: Diagnosis not present

## 2018-07-26 DIAGNOSIS — Z79899 Other long term (current) drug therapy: Secondary | ICD-10-CM | POA: Diagnosis not present

## 2018-07-26 DIAGNOSIS — N189 Chronic kidney disease, unspecified: Secondary | ICD-10-CM | POA: Diagnosis not present

## 2018-07-26 MED ORDER — SODIUM CHLORIDE 0.9 % IV SOLN
Freq: Once | INTRAVENOUS | Status: AC
  Administered 2018-07-26: 15:00:00 via INTRAVENOUS
  Filled 2018-07-26: qty 250

## 2018-07-26 MED ORDER — SODIUM CHLORIDE 0.9 % IV SOLN
510.0000 mg | Freq: Once | INTRAVENOUS | Status: AC
  Administered 2018-07-26: 510 mg via INTRAVENOUS
  Filled 2018-07-26: qty 17

## 2018-07-26 NOTE — Patient Instructions (Signed)

## 2018-08-08 ENCOUNTER — Inpatient Hospital Stay: Payer: Medicare Other

## 2018-08-08 ENCOUNTER — Other Ambulatory Visit: Payer: Self-pay

## 2018-08-08 ENCOUNTER — Inpatient Hospital Stay: Payer: Medicare Other | Attending: Oncology

## 2018-08-08 VITALS — BP 118/62 | HR 82 | Temp 98.9°F | Resp 17

## 2018-08-08 DIAGNOSIS — I129 Hypertensive chronic kidney disease with stage 1 through stage 4 chronic kidney disease, or unspecified chronic kidney disease: Secondary | ICD-10-CM | POA: Diagnosis not present

## 2018-08-08 DIAGNOSIS — D509 Iron deficiency anemia, unspecified: Secondary | ICD-10-CM

## 2018-08-08 DIAGNOSIS — N189 Chronic kidney disease, unspecified: Secondary | ICD-10-CM | POA: Insufficient documentation

## 2018-08-08 DIAGNOSIS — D631 Anemia in chronic kidney disease: Secondary | ICD-10-CM | POA: Insufficient documentation

## 2018-08-08 DIAGNOSIS — Z79899 Other long term (current) drug therapy: Secondary | ICD-10-CM | POA: Insufficient documentation

## 2018-08-08 DIAGNOSIS — D649 Anemia, unspecified: Secondary | ICD-10-CM

## 2018-08-08 LAB — CBC WITH DIFFERENTIAL (CANCER CENTER ONLY)
Abs Immature Granulocytes: 0.02 10*3/uL (ref 0.00–0.07)
Basophils Absolute: 0 10*3/uL (ref 0.0–0.1)
Basophils Relative: 0 %
Eosinophils Absolute: 0.1 10*3/uL (ref 0.0–0.5)
Eosinophils Relative: 1 %
HCT: 27.7 % — ABNORMAL LOW (ref 36.0–46.0)
Hemoglobin: 8.4 g/dL — ABNORMAL LOW (ref 12.0–15.0)
Immature Granulocytes: 0 %
Lymphocytes Relative: 7 %
Lymphs Abs: 0.5 10*3/uL — ABNORMAL LOW (ref 0.7–4.0)
MCH: 30.9 pg (ref 26.0–34.0)
MCHC: 30.3 g/dL (ref 30.0–36.0)
MCV: 101.8 fL — ABNORMAL HIGH (ref 80.0–100.0)
Monocytes Absolute: 0.7 10*3/uL (ref 0.1–1.0)
Monocytes Relative: 10 %
Neutro Abs: 5.7 10*3/uL (ref 1.7–7.7)
Neutrophils Relative %: 82 %
Platelet Count: 199 10*3/uL (ref 150–400)
RBC: 2.72 MIL/uL — ABNORMAL LOW (ref 3.87–5.11)
RDW: 19.6 % — ABNORMAL HIGH (ref 11.5–15.5)
WBC Count: 7.1 10*3/uL (ref 4.0–10.5)
nRBC: 0 % (ref 0.0–0.2)

## 2018-08-08 LAB — IRON AND TIBC
Iron: 60 ug/dL (ref 41–142)
Saturation Ratios: 29 % (ref 21–57)
TIBC: 209 ug/dL — ABNORMAL LOW (ref 236–444)
UIBC: 149 ug/dL (ref 120–384)

## 2018-08-08 LAB — FERRITIN: Ferritin: 213 ng/mL (ref 11–307)

## 2018-08-08 LAB — SAMPLE TO BLOOD BANK

## 2018-08-08 MED ORDER — DARBEPOETIN ALFA 300 MCG/0.6ML IJ SOSY
300.0000 ug | PREFILLED_SYRINGE | Freq: Once | INTRAMUSCULAR | Status: AC
  Administered 2018-08-08: 300 ug via SUBCUTANEOUS

## 2018-08-08 MED ORDER — DARBEPOETIN ALFA 300 MCG/0.6ML IJ SOSY
PREFILLED_SYRINGE | INTRAMUSCULAR | Status: AC
  Filled 2018-08-08: qty 0.6

## 2018-08-08 NOTE — Patient Instructions (Signed)

## 2018-08-16 DIAGNOSIS — H52223 Regular astigmatism, bilateral: Secondary | ICD-10-CM | POA: Diagnosis not present

## 2018-08-29 ENCOUNTER — Other Ambulatory Visit: Payer: Self-pay

## 2018-08-29 ENCOUNTER — Inpatient Hospital Stay: Payer: Medicare Other

## 2018-08-29 ENCOUNTER — Telehealth: Payer: Self-pay

## 2018-08-29 ENCOUNTER — Telehealth: Payer: Self-pay | Admitting: Oncology

## 2018-08-29 VITALS — BP 118/62 | HR 83 | Temp 98.8°F | Resp 17

## 2018-08-29 DIAGNOSIS — Z79899 Other long term (current) drug therapy: Secondary | ICD-10-CM | POA: Diagnosis not present

## 2018-08-29 DIAGNOSIS — N189 Chronic kidney disease, unspecified: Secondary | ICD-10-CM | POA: Diagnosis not present

## 2018-08-29 DIAGNOSIS — D649 Anemia, unspecified: Secondary | ICD-10-CM

## 2018-08-29 DIAGNOSIS — D631 Anemia in chronic kidney disease: Secondary | ICD-10-CM | POA: Diagnosis not present

## 2018-08-29 DIAGNOSIS — D509 Iron deficiency anemia, unspecified: Secondary | ICD-10-CM

## 2018-08-29 DIAGNOSIS — I129 Hypertensive chronic kidney disease with stage 1 through stage 4 chronic kidney disease, or unspecified chronic kidney disease: Secondary | ICD-10-CM | POA: Diagnosis not present

## 2018-08-29 LAB — CBC WITH DIFFERENTIAL (CANCER CENTER ONLY)
Abs Immature Granulocytes: 0.03 10*3/uL (ref 0.00–0.07)
Basophils Absolute: 0 10*3/uL (ref 0.0–0.1)
Basophils Relative: 0 %
Eosinophils Absolute: 0.1 10*3/uL (ref 0.0–0.5)
Eosinophils Relative: 1 %
HCT: 23.9 % — ABNORMAL LOW (ref 36.0–46.0)
Hemoglobin: 7.4 g/dL — ABNORMAL LOW (ref 12.0–15.0)
Immature Granulocytes: 0 %
Lymphocytes Relative: 10 %
Lymphs Abs: 0.7 10*3/uL (ref 0.7–4.0)
MCH: 30.6 pg (ref 26.0–34.0)
MCHC: 31 g/dL (ref 30.0–36.0)
MCV: 98.8 fL (ref 80.0–100.0)
Monocytes Absolute: 0.8 10*3/uL (ref 0.1–1.0)
Monocytes Relative: 11 %
Neutro Abs: 5.7 10*3/uL (ref 1.7–7.7)
Neutrophils Relative %: 78 %
Platelet Count: 249 10*3/uL (ref 150–400)
RBC: 2.42 MIL/uL — ABNORMAL LOW (ref 3.87–5.11)
RDW: 17 % — ABNORMAL HIGH (ref 11.5–15.5)
WBC Count: 7.3 10*3/uL (ref 4.0–10.5)
nRBC: 0 % (ref 0.0–0.2)

## 2018-08-29 LAB — SAMPLE TO BLOOD BANK

## 2018-08-29 LAB — PREPARE RBC (CROSSMATCH)

## 2018-08-29 MED ORDER — DARBEPOETIN ALFA 300 MCG/0.6ML IJ SOSY
PREFILLED_SYRINGE | INTRAMUSCULAR | Status: AC
  Filled 2018-08-29: qty 0.6

## 2018-08-29 MED ORDER — DARBEPOETIN ALFA 300 MCG/0.6ML IJ SOSY
300.0000 ug | PREFILLED_SYRINGE | Freq: Once | INTRAMUSCULAR | Status: AC
  Administered 2018-08-29: 300 ug via SUBCUTANEOUS

## 2018-08-29 NOTE — Patient Instructions (Signed)

## 2018-08-29 NOTE — Telephone Encounter (Signed)
Scheduled appt per 6/23 sch message- pt daughter aware of appt date and time

## 2018-08-29 NOTE — Telephone Encounter (Signed)
Dr. Alen Blew made aware of hemoglobin of 7.4. One unit PRBC's ordered per Dr. Alen Blew. Confirmed orders received with [place] in blood bank. Patient's daughter Pamala Hurry aware of need for transfusion and appointment scheduled for Wednesday 6/24.

## 2018-08-30 ENCOUNTER — Other Ambulatory Visit: Payer: Self-pay

## 2018-08-30 ENCOUNTER — Inpatient Hospital Stay: Payer: Medicare Other

## 2018-08-30 DIAGNOSIS — N189 Chronic kidney disease, unspecified: Secondary | ICD-10-CM | POA: Diagnosis not present

## 2018-08-30 DIAGNOSIS — D649 Anemia, unspecified: Secondary | ICD-10-CM

## 2018-08-30 DIAGNOSIS — D631 Anemia in chronic kidney disease: Secondary | ICD-10-CM | POA: Diagnosis not present

## 2018-08-30 DIAGNOSIS — I129 Hypertensive chronic kidney disease with stage 1 through stage 4 chronic kidney disease, or unspecified chronic kidney disease: Secondary | ICD-10-CM | POA: Diagnosis not present

## 2018-08-30 DIAGNOSIS — Z79899 Other long term (current) drug therapy: Secondary | ICD-10-CM | POA: Diagnosis not present

## 2018-08-30 MED ORDER — DIPHENHYDRAMINE HCL 25 MG PO CAPS
25.0000 mg | ORAL_CAPSULE | Freq: Once | ORAL | Status: AC
  Administered 2018-08-30: 25 mg via ORAL

## 2018-08-30 MED ORDER — SODIUM CHLORIDE 0.9% FLUSH
10.0000 mL | INTRAVENOUS | Status: DC | PRN
  Filled 2018-08-30: qty 10

## 2018-08-30 MED ORDER — DIPHENHYDRAMINE HCL 25 MG PO CAPS
ORAL_CAPSULE | ORAL | Status: AC
  Filled 2018-08-30: qty 1

## 2018-08-30 MED ORDER — ACETAMINOPHEN 325 MG PO TABS
650.0000 mg | ORAL_TABLET | Freq: Once | ORAL | Status: AC
  Administered 2018-08-30: 650 mg via ORAL

## 2018-08-30 MED ORDER — ACETAMINOPHEN 325 MG PO TABS
ORAL_TABLET | ORAL | Status: AC
  Filled 2018-08-30: qty 2

## 2018-08-30 NOTE — Patient Instructions (Signed)
Blood Transfusion, Adult, Care After  This sheet gives you information about how to care for yourself after your procedure. Your health care provider may also give you more specific instructions. If you have problems or questions, contact your health care provider.  What can I expect after the procedure?  After your procedure, it is common to have:   Bruising and soreness where the IV tube was inserted.   Headache.  Follow these instructions at home:     Take over-the-counter and prescription medicines only as told by your health care provider.   Return to your normal activities as told by your health care provider.   Follow instructions from your health care provider about how to take care of your IV insertion site. Make sure you:  ? Wash your hands with soap and water before you change your bandage (dressing). If soap and water are not available, use hand sanitizer.  ? Change your dressing as told by your health care provider.   Check your IV insertion site every day for signs of infection. Check for:  ? More redness, swelling, or pain.  ? More fluid or blood.  ? Warmth.  ? Pus or a bad smell.  Contact a health care provider if:   You have more redness, swelling, or pain around the IV insertion site.   You have more fluid or blood coming from the IV insertion site.   Your IV insertion site feels warm to the touch.   You have pus or a bad smell coming from the IV insertion site.   Your urine turns pink, red, or brown.   You feel weak after doing your normal activities.  Get help right away if:   You have signs of a serious allergic or immune system reaction, including:  ? Itchiness.  ? Hives.  ? Trouble breathing.  ? Anxiety.  ? Chest or lower back pain.  ? Fever, flushing, and chills.  ? Rapid pulse.  ? Rash.  ? Diarrhea.  ? Vomiting.  ? Dark urine.  ? Serious headache.  ? Dizziness.  ? Stiff neck.  ? Yellow coloration of the face or the white parts of the eyes (jaundice).  This information is not  intended to replace advice given to you by your health care provider. Make sure you discuss any questions you have with your health care provider.  Document Released: 03/15/2014 Document Revised: 10/22/2015 Document Reviewed: 09/08/2015  Elsevier Interactive Patient Education  2019 Elsevier Inc.

## 2018-08-31 LAB — TYPE AND SCREEN
ABO/RH(D): A POS
Antibody Screen: NEGATIVE
Unit division: 0

## 2018-08-31 LAB — BPAM RBC
Blood Product Expiration Date: 202007122359
ISSUE DATE / TIME: 202006241448
Unit Type and Rh: 6200

## 2018-09-07 DIAGNOSIS — L821 Other seborrheic keratosis: Secondary | ICD-10-CM | POA: Diagnosis not present

## 2018-09-07 DIAGNOSIS — Z85828 Personal history of other malignant neoplasm of skin: Secondary | ICD-10-CM | POA: Diagnosis not present

## 2018-09-07 DIAGNOSIS — L570 Actinic keratosis: Secondary | ICD-10-CM | POA: Diagnosis not present

## 2018-09-15 DIAGNOSIS — H6123 Impacted cerumen, bilateral: Secondary | ICD-10-CM | POA: Diagnosis not present

## 2018-09-19 ENCOUNTER — Ambulatory Visit: Payer: Medicare Other

## 2018-09-19 ENCOUNTER — Other Ambulatory Visit: Payer: Medicare Other

## 2018-09-19 ENCOUNTER — Ambulatory Visit: Payer: Medicare Other | Admitting: Oncology

## 2018-09-19 DIAGNOSIS — B351 Tinea unguium: Secondary | ICD-10-CM | POA: Diagnosis not present

## 2018-09-19 DIAGNOSIS — M79672 Pain in left foot: Secondary | ICD-10-CM | POA: Diagnosis not present

## 2018-09-19 DIAGNOSIS — M79671 Pain in right foot: Secondary | ICD-10-CM | POA: Diagnosis not present

## 2018-09-20 ENCOUNTER — Inpatient Hospital Stay: Payer: Medicare Other | Attending: Oncology

## 2018-09-20 ENCOUNTER — Other Ambulatory Visit: Payer: Self-pay

## 2018-09-20 ENCOUNTER — Inpatient Hospital Stay: Payer: Medicare Other

## 2018-09-20 ENCOUNTER — Inpatient Hospital Stay (HOSPITAL_BASED_OUTPATIENT_CLINIC_OR_DEPARTMENT_OTHER): Payer: Medicare Other | Admitting: Oncology

## 2018-09-20 VITALS — BP 144/53 | HR 102 | Temp 98.9°F | Resp 18 | Ht 61.0 in | Wt 139.9 lb

## 2018-09-20 DIAGNOSIS — Z79899 Other long term (current) drug therapy: Secondary | ICD-10-CM | POA: Insufficient documentation

## 2018-09-20 DIAGNOSIS — D649 Anemia, unspecified: Secondary | ICD-10-CM

## 2018-09-20 DIAGNOSIS — D631 Anemia in chronic kidney disease: Secondary | ICD-10-CM

## 2018-09-20 DIAGNOSIS — N189 Chronic kidney disease, unspecified: Secondary | ICD-10-CM | POA: Diagnosis not present

## 2018-09-20 DIAGNOSIS — D509 Iron deficiency anemia, unspecified: Secondary | ICD-10-CM | POA: Diagnosis not present

## 2018-09-20 DIAGNOSIS — I129 Hypertensive chronic kidney disease with stage 1 through stage 4 chronic kidney disease, or unspecified chronic kidney disease: Secondary | ICD-10-CM

## 2018-09-20 DIAGNOSIS — Z7982 Long term (current) use of aspirin: Secondary | ICD-10-CM

## 2018-09-20 LAB — CBC WITH DIFFERENTIAL (CANCER CENTER ONLY)
Abs Immature Granulocytes: 0.02 10*3/uL (ref 0.00–0.07)
Basophils Absolute: 0 10*3/uL (ref 0.0–0.1)
Basophils Relative: 0 %
Eosinophils Absolute: 0.1 10*3/uL (ref 0.0–0.5)
Eosinophils Relative: 1 %
HCT: 20.7 % — ABNORMAL LOW (ref 36.0–46.0)
Hemoglobin: 6 g/dL — CL (ref 12.0–15.0)
Immature Granulocytes: 0 %
Lymphocytes Relative: 10 %
Lymphs Abs: 0.6 10*3/uL — ABNORMAL LOW (ref 0.7–4.0)
MCH: 26.4 pg (ref 26.0–34.0)
MCHC: 29 g/dL — ABNORMAL LOW (ref 30.0–36.0)
MCV: 91.2 fL (ref 80.0–100.0)
Monocytes Absolute: 0.8 10*3/uL (ref 0.1–1.0)
Monocytes Relative: 12 %
Neutro Abs: 4.9 10*3/uL (ref 1.7–7.7)
Neutrophils Relative %: 77 %
Platelet Count: 245 10*3/uL (ref 150–400)
RBC: 2.27 MIL/uL — ABNORMAL LOW (ref 3.87–5.11)
RDW: 16.7 % — ABNORMAL HIGH (ref 11.5–15.5)
WBC Count: 6.3 10*3/uL (ref 4.0–10.5)
nRBC: 0 % (ref 0.0–0.2)

## 2018-09-20 LAB — SAMPLE TO BLOOD BANK

## 2018-09-20 LAB — PREPARE RBC (CROSSMATCH)

## 2018-09-20 MED ORDER — DARBEPOETIN ALFA 300 MCG/0.6ML IJ SOSY
300.0000 ug | PREFILLED_SYRINGE | Freq: Once | INTRAMUSCULAR | Status: AC
  Administered 2018-09-20: 300 ug via SUBCUTANEOUS

## 2018-09-20 MED ORDER — DARBEPOETIN ALFA 300 MCG/0.6ML IJ SOSY
PREFILLED_SYRINGE | INTRAMUSCULAR | Status: AC
  Filled 2018-09-20: qty 0.6

## 2018-09-20 NOTE — Progress Notes (Signed)
Hematology and Oncology Follow Up Visit  Lisa [name] [id number] [birth year] 85 y.o. 09/20/2018 3:11 PM Lisa [name], MDBarnes, [name], MD   Principle Diagnosis: 85 year old woman with anemia diagnosed in 2018.  Her anemia is related to renal insufficiency, iron deficiency and chronic disease.  .       Prior Therapy: IV iron infusion infusion as well as packed red cell transfusion.  Current therapy:   Aranesp 300 mcg every 3 weeks    Feraheme at 1000 mg every 9 weeks.  Supportive packed red cell transfusion for hemoglobin below 8.    Interim History: Lisa [name] returns today for a repeat evaluation.  Since the last visit, she reports no major changes in her health.  She received packed red cell transfusion with 1 unit 3 weeks ago after hemoglobin was 7.4.  She did not notice any difference at this time and denied any shortness of breath or difficulty breathing.  She denies any hematochezia or melena.   She denied any alteration mental status, neuropathy, confusion or dizziness.  Denies any headaches or lethargy.  Denies any night sweats, weight loss or changes in appetite.  Denied orthopnea, dyspnea on exertion or chest discomfort.  Denies shortness of breath, difficulty breathing hemoptysis or cough.  Denies any abdominal distention, nausea, early satiety or dyspepsia.  Denies any hematuria, frequency, dysuria or nocturia.  Denies any skin irritation, dryness or rash.  Denies any ecchymosis or petechiae.  Denies any lymphadenopathy or clotting.  Denies any heat or cold intolerance.  Denies any anxiety or depression.  Remaining review of system is negative.          Medications: No changes in medication noted by review. Current Outpatient Medications  Medication Sig Dispense Refill  . acetaminophen (TYLENOL) 500 MG tablet Take 500-1,000 mg by mouth at bedtime.    Marland Kitchen aspirin EC 81 MG tablet Take 81 mg by mouth daily.    . calcium carbonate (OS-CAL) 600 MG TABS Take 600 mg  by mouth daily.     . ferrous sulfate 325 (65 FE) MG tablet Take 325 mg by mouth daily with breakfast.    . loratadine (CLARITIN) 10 MG tablet Take 10 mg by mouth daily as needed.     . Multiple Vitamins-Minerals (MULTIVITAMIN WITH MINERALS) tablet Take 1 tablet by mouth daily.    Marland Kitchen OVER THE COUNTER MEDICATION Apply topically as needed. Cream for arthritic pain. Patient unable to recall the name at this time.    Marland Kitchen oxybutynin (DITROPAN) 5 MG tablet Take 2.5 mg by mouth 2 (two) times daily.  2  . pramipexole (MIRAPEX) 0.25 MG tablet Take 0.25 mg by mouth at bedtime.    . primidone (MYSOLINE) 50 MG tablet Take 3 tablets (150 mg total) by mouth at bedtime. 270 tablet 4  . raloxifene (EVISTA) 60 MG tablet Take 60 mg by mouth at bedtime.     . timolol (TIMOPTIC) 0.5 % ophthalmic solution Place 1 drop into both eyes daily.   1  . UNABLE TO FIND Med Name: Aranesp infusions every 3 weeks administered by [facility]    . VYTORIN 10-20 MG tablet Take 1 tablet by mouth daily.  0   No current facility-administered medications for this visit.      Allergies:  Allergies  Allergen Reactions  . Mycostatin [Nystatin] Swelling and Rash    Past Medical History, Surgical history, Social history, and Family History remains unchanged by review.  Physical Exam:     Blood  pressure (!) 144/53, pulse (!) 102, temperature 98.9 F (37.2 C), temperature source Oral, resp. rate 18, height 5\' 1"  (1.549 m), weight 139 lb 14.4 oz (63.5 kg), SpO2 96 %.     ECOG: 1   General appearance: Comfortable appearing without any discomfort Head: Normocephalic without any trauma Oropharynx: Mucous membranes are moist and pink without any thrush or ulcers. Eyes: Pupils are equal and round reactive to light. Lymph nodes: No cervical, supraclavicular, inguinal or axillary lymphadenopathy.   Heart:regular rate and rhythm.  S1 and S2 without leg edema. Lung: Clear without any rhonchi or wheezes.  No dullness  to percussion. Abdomin: Soft, nontender, nondistended with good bowel sounds.  No hepatosplenomegaly. Musculoskeletal: No joint deformity or effusion.  Full range of motion noted. Neurological: No deficits noted on motor, sensory and deep tendon reflex exam. Skin: No petechial rash or dryness.  Appeared moist.       Lab Results: Lab Results  Component Value Date   WBC 7.3 08/29/2018   HGB 7.4 (L) 08/29/2018   HCT 23.9 (L) 08/29/2018   MCV 98.8 08/29/2018   PLT 249 08/29/2018     Chemistry      Component Value Date/Time   NA 138 11/30/2017 1435   NA 135 (A) 04/01/2016   K 4.4 11/30/2017 1435   CL 103 11/30/2017 1435   CO2 30 11/30/2017 1435   BUN 21 11/30/2017 1435   BUN 19 04/01/2016   CREATININE 0.88 11/30/2017 1435   GLU 102 04/01/2016      Component Value Date/Time   CALCIUM 8.6 (L) 11/30/2017 1435   ALKPHOS 74 11/30/2017 1435   AST 17 11/30/2017 1435   ALT 16 11/30/2017 1435   BILITOT <0.2 (L) 11/30/2017 1435       Impression and Plan:  85 year old woman with  1.  Multifactorial anemia diagnosed in 2018.    She continues to receive supportive care with packed red cell transfusion, Aranesp and IV iron infusion.  She has been receiving intermittent packed red cell transfusion to keep her hemoglobin above 8.  Aranesp has been well-tolerated without any complications at this time.  The natural course of this disease as well as continuing his current options were reviewed.  Her hemoglobin is 6.0 I will receive 2 units of packed red cells on 09/21/2018 and she will receive Aranesp today.  2.  Iron deficiency: Iron studies in June 2020 showed iron level of 60 with appropriate ferritin level.  No need for Feraheme infusion at this time.       3.  Follow-up: She will continue to return every 3 weeks for Aranesp injection as well as packed red cell transfusion as needed.   25  minutes was spent with the patient face-to-face today.  More than 50% of time was  spent on reviewing her laboratory data, treatment strategies, complications related to therapy as well as coordinating her future plan of care with her as well as her daughter via phone.      Lisa HoseFiras Arish Redner, MD 7/15/20203:11 PM

## 2018-09-20 NOTE — Patient Instructions (Signed)

## 2018-09-21 ENCOUNTER — Other Ambulatory Visit: Payer: Self-pay

## 2018-09-21 ENCOUNTER — Telehealth: Payer: Self-pay | Admitting: Oncology

## 2018-09-21 ENCOUNTER — Other Ambulatory Visit: Payer: Self-pay | Admitting: Oncology

## 2018-09-21 ENCOUNTER — Inpatient Hospital Stay: Payer: Medicare Other

## 2018-09-21 ENCOUNTER — Telehealth: Payer: Self-pay

## 2018-09-21 VITALS — BP 117/84 | HR 85 | Temp 99.0°F | Resp 17

## 2018-09-21 DIAGNOSIS — Z79899 Other long term (current) drug therapy: Secondary | ICD-10-CM | POA: Diagnosis not present

## 2018-09-21 DIAGNOSIS — D631 Anemia in chronic kidney disease: Secondary | ICD-10-CM | POA: Diagnosis not present

## 2018-09-21 DIAGNOSIS — Z7982 Long term (current) use of aspirin: Secondary | ICD-10-CM | POA: Diagnosis not present

## 2018-09-21 DIAGNOSIS — D509 Iron deficiency anemia, unspecified: Secondary | ICD-10-CM | POA: Diagnosis not present

## 2018-09-21 DIAGNOSIS — N189 Chronic kidney disease, unspecified: Secondary | ICD-10-CM

## 2018-09-21 DIAGNOSIS — D649 Anemia, unspecified: Secondary | ICD-10-CM

## 2018-09-21 DIAGNOSIS — I129 Hypertensive chronic kidney disease with stage 1 through stage 4 chronic kidney disease, or unspecified chronic kidney disease: Secondary | ICD-10-CM | POA: Diagnosis not present

## 2018-09-21 LAB — IRON AND TIBC
Iron: 15 ug/dL — ABNORMAL LOW (ref 41–142)
Saturation Ratios: 5 % — ABNORMAL LOW (ref 21–57)
TIBC: 278 ug/dL (ref 236–444)
UIBC: 263 ug/dL (ref 120–384)

## 2018-09-21 LAB — FERRITIN: Ferritin: 12 ng/mL (ref 11–307)

## 2018-09-21 MED ORDER — DIPHENHYDRAMINE HCL 25 MG PO CAPS: 25.0000 mg | ORAL_CAPSULE | Freq: Once | ORAL | Status: DC

## 2018-09-21 MED ORDER — ACETAMINOPHEN 325 MG PO TABS: 650.0000 mg | ORAL_TABLET | Freq: Once | ORAL | Status: DC

## 2018-09-21 MED ORDER — SODIUM CHLORIDE 0.9 % IV SOLN
Freq: Once | INTRAVENOUS | Status: AC
  Administered 2018-09-21: 15:00:00 via INTRAVENOUS
  Filled 2018-09-21: qty 250

## 2018-09-21 MED ORDER — SODIUM CHLORIDE 0.9 % IV SOLN
510.0000 mg | Freq: Once | INTRAVENOUS | Status: AC
  Administered 2018-09-21: 15:00:00 510 mg via INTRAVENOUS
  Filled 2018-09-21: qty 510

## 2018-09-21 MED ORDER — SODIUM CHLORIDE 0.9% IV SOLUTION
250.0000 mL | Freq: Once | INTRAVENOUS | Status: AC
  Administered 2018-09-21: 250 mL via INTRAVENOUS
  Filled 2018-09-21: qty 250

## 2018-09-21 NOTE — Patient Instructions (Signed)
Blood Transfusion, Adult, Care After  This sheet gives you information about how to care for yourself after your procedure. Your health care provider may also give you more specific instructions. If you have problems or questions, contact your health care provider.  What can I expect after the procedure?  After your procedure, it is common to have:   Bruising and soreness where the IV tube was inserted.   Headache.  Follow these instructions at home:     Take over-the-counter and prescription medicines only as told by your health care provider.   Return to your normal activities as told by your health care provider.   Follow instructions from your health care provider about how to take care of your IV insertion site. Make sure you:  ? Wash your hands with soap and water before you change your bandage (dressing). If soap and water are not available, use hand sanitizer.  ? Change your dressing as told by your health care provider.   Check your IV insertion site every day for signs of infection. Check for:  ? More redness, swelling, or pain.  ? More fluid or blood.  ? Warmth.  ? Pus or a bad smell.  Contact a health care provider if:   You have more redness, swelling, or pain around the IV insertion site.   You have more fluid or blood coming from the IV insertion site.   Your IV insertion site feels warm to the touch.   You have pus or a bad smell coming from the IV insertion site.   Your urine turns pink, red, or brown.   You feel weak after doing your normal activities.  Get help right away if:   You have signs of a serious allergic or immune system reaction, including:  ? Itchiness.  ? Hives.  ? Trouble breathing.  ? Anxiety.  ? Chest or lower back pain.  ? Fever, flushing, and chills.  ? Rapid pulse.  ? Rash.  ? Diarrhea.  ? Vomiting.  ? Dark urine.  ? Serious headache.  ? Dizziness.  ? Stiff neck.  ? Yellow coloration of the face or the white parts of the eyes (jaundice).  This information is not  intended to replace advice given to you by your health care provider. Make sure you discuss any questions you have with your health care provider.  Document Released: 03/15/2014 Document Revised: 10/22/2015 Document Reviewed: 09/08/2015  Elsevier Interactive Patient Education  2019 Elsevier Inc.

## 2018-09-21 NOTE — Progress Notes (Signed)
Patient takes Tylenol 650 mg and Benadryl 25mg  po prior to blood transfusion. She takes her own medications from home and Jarrett Soho RN infusion is aware.

## 2018-09-21 NOTE — Telephone Encounter (Signed)
Scheduled appt per 7/15 and 7/16 sch message- pt daughter aware of appt date and time

## 2018-09-21 NOTE — Telephone Encounter (Signed)
Contacted patient daughter Pamala Hurry and made her aware that the patient is going to receive a dose of IV iron today, added on to the blood transfusion, and scheduling will call to set up the next infusion in addition to follow up appointments. She understands to call back with any questions or concerns or if she has not heard from scheduling in a timely manner.

## 2018-09-21 NOTE — Telephone Encounter (Signed)
-----   Message from Wyatt Portela, MD sent at 09/21/2018 10:35 AM EDT ----- Please let her daughter know that iron is indeed low and will need IV iron with blood. She needs IV iron today and next week. I will send a message to scheduling for next week.

## 2018-09-22 LAB — BPAM RBC
Blood Product Expiration Date: 202008102359
Blood Product Expiration Date: 202008102359
ISSUE DATE / TIME: 202007161023
ISSUE DATE / TIME: 202007161023
Unit Type and Rh: 6200
Unit Type and Rh: 6200

## 2018-09-22 LAB — TYPE AND SCREEN
ABO/RH(D): A POS
Antibody Screen: NEGATIVE
Unit division: 0
Unit division: 0

## 2018-09-28 ENCOUNTER — Other Ambulatory Visit: Payer: Self-pay

## 2018-09-28 ENCOUNTER — Inpatient Hospital Stay: Payer: Medicare Other

## 2018-09-28 VITALS — BP 127/52 | HR 94 | Temp 98.7°F | Resp 16

## 2018-09-28 DIAGNOSIS — D509 Iron deficiency anemia, unspecified: Secondary | ICD-10-CM

## 2018-09-28 DIAGNOSIS — Z79899 Other long term (current) drug therapy: Secondary | ICD-10-CM | POA: Diagnosis not present

## 2018-09-28 DIAGNOSIS — N189 Chronic kidney disease, unspecified: Secondary | ICD-10-CM | POA: Diagnosis not present

## 2018-09-28 DIAGNOSIS — D631 Anemia in chronic kidney disease: Secondary | ICD-10-CM | POA: Diagnosis not present

## 2018-09-28 DIAGNOSIS — I129 Hypertensive chronic kidney disease with stage 1 through stage 4 chronic kidney disease, or unspecified chronic kidney disease: Secondary | ICD-10-CM | POA: Diagnosis not present

## 2018-09-28 DIAGNOSIS — D649 Anemia, unspecified: Secondary | ICD-10-CM

## 2018-09-28 DIAGNOSIS — Z7982 Long term (current) use of aspirin: Secondary | ICD-10-CM | POA: Diagnosis not present

## 2018-09-28 MED ORDER — SODIUM CHLORIDE 0.9 % IV SOLN
Freq: Once | INTRAVENOUS | Status: AC
  Administered 2018-09-28: 15:00:00 via INTRAVENOUS
  Filled 2018-09-28: qty 250

## 2018-09-28 MED ORDER — SODIUM CHLORIDE 0.9 % IV SOLN
510.0000 mg | Freq: Once | INTRAVENOUS | Status: AC
  Administered 2018-09-28: 510 mg via INTRAVENOUS
  Filled 2018-09-28: qty 17

## 2018-09-28 NOTE — Patient Instructions (Signed)
Blood Transfusion, Adult, Care After This sheet gives you information about how to care for yourself after your procedure. Your doctor may also give you more specific instructions. If you have problems or questions, contact your doctor. Follow these instructions at home:   Take over-the-counter and prescription medicines only as told by your doctor.  Go back to your normal activities as told by your doctor.  Follow instructions from your doctor about how to take care of the area where an IV tube was put into your vein (insertion site). Make sure you: ? Wash your hands with soap and water before you change your bandage (dressing). If there is no soap and water, use hand sanitizer. ? Change your bandage as told by your doctor.  Check your IV insertion site every day for signs of infection. Check for: ? More redness, swelling, or pain. ? More fluid or blood. ? Warmth. ? Pus or a bad smell. Contact a doctor if:  You have more redness, swelling, or pain around the IV insertion site.  You have more fluid or blood coming from the IV insertion site.  Your IV insertion site feels warm to the touch.  You have pus or a bad smell coming from the IV insertion site.  Your pee (urine) turns pink, red, or brown.  You feel weak after doing your normal activities. Get help right away if:  You have signs of a serious allergic or body defense (immune) system reaction, including: ? Itchiness. ? Hives. ? Trouble breathing. ? Anxiety. ? Pain in your chest or lower back. ? Fever, flushing, and chills. ? Fast pulse. ? Rash. ? Watery poop (diarrhea). ? Throwing up (vomiting). ? Dark pee. ? Serious headache. ? Dizziness. ? Stiff neck. ? Yellow color in your face or the white parts of your eyes (jaundice). Summary  After a blood transfusion, return to your normal activities as told by your doctor.  Every day, check for signs of infection where the IV tube was put into your vein.  Some  signs of infection are warm skin, more redness and pain, more fluid or blood, and pus or a bad smell where the needle went in.  Contact your doctor if you feel weak or have any unusual symptoms. This information is not intended to replace advice given to you by your health care provider. Make sure you discuss any questions you have with your health care provider. Document Released: 03/15/2014 Document Revised: 06/29/2017 Document Reviewed: 10/17/2015 Elsevier Patient Education  [place].  Ferumoxytol injection What is this medicine? FERUMOXYTOL is an iron complex. Iron is used to make healthy red blood cells, which carry oxygen and nutrients throughout the body. This medicine is used to treat iron deficiency anemia. This medicine may be used for other purposes; ask your health care provider or pharmacist if you have questions. COMMON BRAND NAME(S): Feraheme What should I tell my health care provider before I take this medicine? They need to know if you have any of these conditions:  anemia not caused by low iron levels  high levels of iron in the blood  magnetic resonance imaging (MRI) test scheduled  an unusual or allergic reaction to iron, other medicines, foods, dyes, or preservatives  pregnant or trying to get pregnant  breast-feeding How should I use this medicine? This medicine is for injection into a vein. It is given by a health care professional in a hospital or clinic setting. Talk to your pediatrician regarding the use of this  medicine in children. Special care may be needed. Overdosage: If you think you have taken too much of this medicine contact a poison control center or emergency room at once. NOTE: This medicine is only for you. Do not share this medicine with others. What if I miss a dose? It is important not to miss your dose. Call your doctor or health care professional if you are unable to keep an appointment. What may interact with this medicine? This  medicine may interact with the following medications:  other iron products This list may not describe all possible interactions. Give your health care provider a list of all the medicines, herbs, non-prescription drugs, or dietary supplements you use. Also tell them if you smoke, drink alcohol, or use illegal drugs. Some items may interact with your medicine. What should I watch for while using this medicine? Visit your doctor or healthcare professional regularly. Tell your doctor or healthcare professional if your symptoms do not start to get better or if they get worse. You may need blood work done while you are taking this medicine. You may need to follow a special diet. Talk to your doctor. Foods that contain iron include: whole grains/cereals, dried fruits, beans, or peas, leafy green vegetables, and organ meats (liver, kidney). What side effects may I notice from receiving this medicine? Side effects that you should report to your doctor or health care professional as soon as possible:  allergic reactions like skin rash, itching or hives, swelling of the face, lips, or tongue  breathing problems  changes in blood pressure  feeling faint or lightheaded, falls  fever or chills  flushing, sweating, or hot feelings  swelling of the ankles or feet Side effects that usually do not require medical attention (report to your doctor or health care professional if they continue or are bothersome):  diarrhea  headache  nausea, vomiting  stomach pain This list may not describe all possible side effects. Call your doctor for medical advice about side effects. You may report side effects to FDA at 1-800-FDA-1088. Where should I keep my medicine? This drug is given in a hospital or clinic and will not be stored at home. NOTE: This sheet is a summary. It may not cover all possible information. If you have questions about this medicine, talk to your doctor, pharmacist, or health care provider.   2020 Elsevier/Gold Standard (2016-04-12 20:21:10)

## 2018-10-04 DIAGNOSIS — L570 Actinic keratosis: Secondary | ICD-10-CM | POA: Diagnosis not present

## 2018-10-04 DIAGNOSIS — Z85828 Personal history of other malignant neoplasm of skin: Secondary | ICD-10-CM | POA: Diagnosis not present

## 2018-10-04 DIAGNOSIS — L820 Inflamed seborrheic keratosis: Secondary | ICD-10-CM | POA: Diagnosis not present

## 2018-10-10 ENCOUNTER — Other Ambulatory Visit: Payer: Self-pay | Admitting: Oncology

## 2018-10-10 ENCOUNTER — Other Ambulatory Visit: Payer: Self-pay

## 2018-10-10 DIAGNOSIS — D649 Anemia, unspecified: Secondary | ICD-10-CM

## 2018-10-11 ENCOUNTER — Inpatient Hospital Stay: Payer: Medicare Other | Attending: Oncology

## 2018-10-11 ENCOUNTER — Other Ambulatory Visit: Payer: Self-pay

## 2018-10-11 ENCOUNTER — Inpatient Hospital Stay: Payer: Medicare Other

## 2018-10-11 ENCOUNTER — Other Ambulatory Visit: Payer: Self-pay | Admitting: *Deleted

## 2018-10-11 VITALS — BP 133/69 | HR 90 | Temp 98.5°F | Resp 16

## 2018-10-11 DIAGNOSIS — Z79899 Other long term (current) drug therapy: Secondary | ICD-10-CM | POA: Diagnosis not present

## 2018-10-11 DIAGNOSIS — D649 Anemia, unspecified: Secondary | ICD-10-CM

## 2018-10-11 DIAGNOSIS — N189 Chronic kidney disease, unspecified: Secondary | ICD-10-CM | POA: Diagnosis not present

## 2018-10-11 DIAGNOSIS — I129 Hypertensive chronic kidney disease with stage 1 through stage 4 chronic kidney disease, or unspecified chronic kidney disease: Secondary | ICD-10-CM | POA: Diagnosis not present

## 2018-10-11 DIAGNOSIS — D509 Iron deficiency anemia, unspecified: Secondary | ICD-10-CM

## 2018-10-11 DIAGNOSIS — D631 Anemia in chronic kidney disease: Secondary | ICD-10-CM | POA: Diagnosis not present

## 2018-10-11 LAB — FERRITIN: Ferritin: 252 ng/mL (ref 11–307)

## 2018-10-11 LAB — CBC WITH DIFFERENTIAL (CANCER CENTER ONLY)
Abs Immature Granulocytes: 0.03 10*3/uL (ref 0.00–0.07)
Basophils Absolute: 0 10*3/uL (ref 0.0–0.1)
Basophils Relative: 1 %
Eosinophils Absolute: 0.1 10*3/uL (ref 0.0–0.5)
Eosinophils Relative: 1 %
HCT: 30.4 % — ABNORMAL LOW (ref 36.0–46.0)
Hemoglobin: 9.7 g/dL — ABNORMAL LOW (ref 12.0–15.0)
Immature Granulocytes: 0 %
Lymphocytes Relative: 7 %
Lymphs Abs: 0.6 10*3/uL — ABNORMAL LOW (ref 0.7–4.0)
MCH: 31 pg (ref 26.0–34.0)
MCHC: 31.9 g/dL (ref 30.0–36.0)
MCV: 97.1 fL (ref 80.0–100.0)
Monocytes Absolute: 0.8 10*3/uL (ref 0.1–1.0)
Monocytes Relative: 9 %
Neutro Abs: 6.7 10*3/uL (ref 1.7–7.7)
Neutrophils Relative %: 82 %
Platelet Count: 225 10*3/uL (ref 150–400)
RBC: 3.13 MIL/uL — ABNORMAL LOW (ref 3.87–5.11)
RDW: 20.6 % — ABNORMAL HIGH (ref 11.5–15.5)
WBC Count: 8.2 10*3/uL (ref 4.0–10.5)
nRBC: 0 % (ref 0.0–0.2)

## 2018-10-11 LAB — IRON AND TIBC
Iron: 40 ug/dL — ABNORMAL LOW (ref 41–142)
Saturation Ratios: 19 % — ABNORMAL LOW (ref 21–57)
TIBC: 206 ug/dL — ABNORMAL LOW (ref 236–444)
UIBC: 167 ug/dL (ref 120–384)

## 2018-10-11 MED ORDER — DARBEPOETIN ALFA 300 MCG/0.6ML IJ SOSY
300.0000 ug | PREFILLED_SYRINGE | Freq: Once | INTRAMUSCULAR | Status: AC
  Administered 2018-10-11: 300 ug via SUBCUTANEOUS

## 2018-10-11 MED ORDER — DARBEPOETIN ALFA 300 MCG/0.6ML IJ SOSY
PREFILLED_SYRINGE | INTRAMUSCULAR | Status: AC
  Filled 2018-10-11: qty 0.6

## 2018-10-11 NOTE — Patient Instructions (Signed)
Darbepoetin Alfa injection What is this medicine? DARBEPOETIN ALFA (dar be POE e tin AL fa) helps your body make more red blood cells. It is used to treat anemia caused by chronic kidney failure and chemotherapy. This medicine may be used for other purposes; ask your health care provider or pharmacist if you have questions. COMMON BRAND NAME(S): Aranesp What should I tell my health care provider before I take this medicine? They need to know if you have any of these conditions:  blood clotting disorders or history of blood clots  cancer patient not on chemotherapy  cystic fibrosis  heart disease, such as angina, heart failure, or a history of a heart attack  hemoglobin level of 12 g/dL or greater  high blood pressure  low levels of folate, iron, or vitamin B12  seizures  an unusual or allergic reaction to darbepoetin, erythropoietin, albumin, hamster proteins, latex, other medicines, foods, dyes, or preservatives  pregnant or trying to get pregnant  breast-feeding How should I use this medicine? This medicine is for injection into a vein or under the skin. It is usually given by a health care professional in a hospital or clinic setting. If you get this medicine at home, you will be taught how to prepare and give this medicine. Use exactly as directed. Take your medicine at regular intervals. Do not take your medicine more often than directed. It is important that you put your used needles and syringes in a special sharps container. Do not put them in a trash can. If you do not have a sharps container, call your pharmacist or healthcare provider to get one. A special MedGuide will be given to you by the pharmacist with each prescription and refill. Be sure to read this information carefully each time. Talk to your pediatrician regarding the use of this medicine in children. While this medicine may be used in children as young as 1 month of age for selected conditions, precautions do  apply. Overdosage: If you think you have taken too much of this medicine contact a poison control center or emergency room at once. NOTE: This medicine is only for you. Do not share this medicine with others. What if I miss a dose? If you miss a dose, take it as soon as you can. If it is almost time for your next dose, take only that dose. Do not take double or extra doses. What may interact with this medicine? Do not take this medicine with any of the following medications:  epoetin alfa This list may not describe all possible interactions. Give your health care provider a list of all the medicines, herbs, non-prescription drugs, or dietary supplements you use. Also tell them if you smoke, drink alcohol, or use illegal drugs. Some items may interact with your medicine. What should I watch for while using this medicine? Your condition will be monitored carefully while you are receiving this medicine. You may need blood work done while you are taking this medicine. This medicine may cause a decrease in vitamin B6. You should make sure that you get enough vitamin B6 while you are taking this medicine. Discuss the foods you eat and the vitamins you take with your health care professional. What side effects may I notice from receiving this medicine? Side effects that you should report to your doctor or health care professional as soon as possible:  allergic reactions like skin rash, itching or hives, swelling of the face, lips, or tongue  breathing problems  changes in   vision  chest pain  confusion, trouble speaking or understanding  feeling faint or lightheaded, falls  high blood pressure  muscle aches or pains  pain, swelling, warmth in the leg  rapid weight gain  severe headaches  sudden numbness or weakness of the face, arm or leg  trouble walking, dizziness, loss of balance or coordination  seizures (convulsions)  swelling of the ankles, feet, hands  unusually weak or  tired Side effects that usually do not require medical attention (report to your doctor or health care professional if they continue or are bothersome):  diarrhea  fever, chills (flu-like symptoms)  headaches  nausea, vomiting  redness, stinging, or swelling at site where injected This list may not describe all possible side effects. Call your doctor for medical advice about side effects. You may report side effects to FDA at 1-800-FDA-1088. Where should I keep my medicine? Keep out of the reach of children. Store in a refrigerator between 2 and 8 degrees C (36 and 46 degrees F). Do not freeze. Do not shake. Throw away any unused portion if using a single-dose vial. Throw away any unused medicine after the expiration date. NOTE: This sheet is a summary. It may not cover all possible information. If you have questions about this medicine, talk to your doctor, pharmacist, or health care provider.  2020 Elsevier/Gold Standard (2017-03-09 16:44:20)  

## 2018-10-12 ENCOUNTER — Inpatient Hospital Stay: Payer: Medicare Other

## 2018-10-24 DIAGNOSIS — R7301 Impaired fasting glucose: Secondary | ICD-10-CM | POA: Diagnosis not present

## 2018-10-24 DIAGNOSIS — I10 Essential (primary) hypertension: Secondary | ICD-10-CM | POA: Diagnosis not present

## 2018-10-24 DIAGNOSIS — E782 Mixed hyperlipidemia: Secondary | ICD-10-CM | POA: Diagnosis not present

## 2018-10-24 DIAGNOSIS — M810 Age-related osteoporosis without current pathological fracture: Secondary | ICD-10-CM | POA: Diagnosis not present

## 2018-10-31 ENCOUNTER — Other Ambulatory Visit: Payer: Self-pay

## 2018-11-01 ENCOUNTER — Inpatient Hospital Stay: Payer: Medicare Other

## 2018-11-01 ENCOUNTER — Other Ambulatory Visit: Payer: Self-pay

## 2018-11-01 VITALS — BP 128/68 | HR 78 | Temp 98.5°F | Resp 16

## 2018-11-01 DIAGNOSIS — D631 Anemia in chronic kidney disease: Secondary | ICD-10-CM | POA: Diagnosis not present

## 2018-11-01 DIAGNOSIS — D509 Iron deficiency anemia, unspecified: Secondary | ICD-10-CM

## 2018-11-01 DIAGNOSIS — I129 Hypertensive chronic kidney disease with stage 1 through stage 4 chronic kidney disease, or unspecified chronic kidney disease: Secondary | ICD-10-CM | POA: Diagnosis not present

## 2018-11-01 DIAGNOSIS — D649 Anemia, unspecified: Secondary | ICD-10-CM

## 2018-11-01 DIAGNOSIS — N189 Chronic kidney disease, unspecified: Secondary | ICD-10-CM | POA: Diagnosis not present

## 2018-11-01 DIAGNOSIS — Z79899 Other long term (current) drug therapy: Secondary | ICD-10-CM | POA: Diagnosis not present

## 2018-11-01 LAB — CBC WITH DIFFERENTIAL (CANCER CENTER ONLY)
Abs Immature Granulocytes: 0.01 10*3/uL (ref 0.00–0.07)
Basophils Absolute: 0 10*3/uL (ref 0.0–0.1)
Basophils Relative: 1 %
Eosinophils Absolute: 0.1 10*3/uL (ref 0.0–0.5)
Eosinophils Relative: 2 %
HCT: 28.7 % — ABNORMAL LOW (ref 36.0–46.0)
Hemoglobin: 8.9 g/dL — ABNORMAL LOW (ref 12.0–15.0)
Immature Granulocytes: 0 %
Lymphocytes Relative: 8 %
Lymphs Abs: 0.6 10*3/uL — ABNORMAL LOW (ref 0.7–4.0)
MCH: 30.7 pg (ref 26.0–34.0)
MCHC: 31 g/dL (ref 30.0–36.0)
MCV: 99 fL (ref 80.0–100.0)
Monocytes Absolute: 0.6 10*3/uL (ref 0.1–1.0)
Monocytes Relative: 8 %
Neutro Abs: 5.7 10*3/uL (ref 1.7–7.7)
Neutrophils Relative %: 81 %
Platelet Count: 251 10*3/uL (ref 150–400)
RBC: 2.9 MIL/uL — ABNORMAL LOW (ref 3.87–5.11)
RDW: 18.1 % — ABNORMAL HIGH (ref 11.5–15.5)
WBC Count: 7 10*3/uL (ref 4.0–10.5)
nRBC: 0 % (ref 0.0–0.2)

## 2018-11-01 LAB — SAMPLE TO BLOOD BANK

## 2018-11-01 MED ORDER — DARBEPOETIN ALFA 300 MCG/0.6ML IJ SOSY
PREFILLED_SYRINGE | INTRAMUSCULAR | Status: AC
  Filled 2018-11-01: qty 0.6

## 2018-11-01 MED ORDER — DARBEPOETIN ALFA 300 MCG/0.6ML IJ SOSY
300.0000 ug | PREFILLED_SYRINGE | Freq: Once | INTRAMUSCULAR | Status: AC
  Administered 2018-11-01: 300 ug via SUBCUTANEOUS

## 2018-11-01 NOTE — Patient Instructions (Signed)

## 2018-11-02 ENCOUNTER — Inpatient Hospital Stay: Payer: Medicare Other

## 2018-11-20 ENCOUNTER — Other Ambulatory Visit: Payer: Self-pay | Admitting: Neurology

## 2018-11-20 ENCOUNTER — Telehealth: Payer: Self-pay | Admitting: Neurology

## 2018-11-20 DIAGNOSIS — G250 Essential tremor: Secondary | ICD-10-CM

## 2018-11-20 MED ORDER — PRIMIDONE 50 MG PO TABS: 150.0000 mg | ORAL_TABLET | Freq: Every day | ORAL | 3 refills | Status: DC

## 2018-11-20 MED ORDER — PRIMIDONE 50 MG PO TABS: 150.0000 mg | ORAL_TABLET | Freq: Every day | ORAL | 0 refills | Status: DC

## 2018-11-20 NOTE — Telephone Encounter (Signed)
Pt's daughter Pamala Hurry, no DPR on File called wanting to know that even though the pt has not been here in a year, if she is able to get a refill on her primidone (MYSOLINE) 50 MG tablet because they are trying to make unnecessary trips due to the Covid. Please advise.

## 2018-11-20 NOTE — Telephone Encounter (Signed)
That is completely fine it has been refilled thanks

## 2018-11-20 NOTE — Telephone Encounter (Signed)
Found 2017 DPR from [facility] on media tab. I called [name] (on DPR) back and LVM asking for call back. We can provide a temporary supply of primidone. When she calls back, I will ask if pt can do a mychart video visit (or telephone visit if unable) with Amy NP for f/u and to continue providing refills. I sent a 30 day supply to [facility] on [address].

## 2018-11-20 NOTE — Telephone Encounter (Signed)
Pamala Hurry called me back. She asked if possible could Dr. Jaynee Eagles approve pt to have a 6 month primidone refill and then reevaluate next spring (COVID situation). She stated she was trying to not have pt do another health visit if possible. Pt has an upcoming telehealth visit with PCP. She sees hemo/onc every 9 weeks, gets infusions, iron replacement, labs every 3 weeks, injections. She stated if needed, pt could do a telephone visit next month. She reported pt has had no changes and on the primidone things are "status quo". I informed her I would call back after hearing from Dr. Jaynee Eagles as she is currently out of the office. [name] verbalized appreciation.

## 2018-11-21 ENCOUNTER — Encounter: Payer: Self-pay | Admitting: *Deleted

## 2018-11-21 NOTE — Telephone Encounter (Signed)
I spoke with Lisa [name] and let her know Dr. Jaynee Eagles was fine with that plan and gave her a year's worth of refills of Prim[facility] to [facility]. I asked her to give [place] a call as soon as ready to make an appt. She verbalized understanding and appreciation and her questions were answered.

## 2018-11-21 NOTE — Telephone Encounter (Signed)
Sent a my chart message.

## 2018-11-23 ENCOUNTER — Other Ambulatory Visit: Payer: Self-pay

## 2018-11-23 ENCOUNTER — Telehealth: Payer: Self-pay

## 2018-11-23 ENCOUNTER — Inpatient Hospital Stay: Payer: Medicare Other | Attending: Oncology | Admitting: Oncology

## 2018-11-23 ENCOUNTER — Inpatient Hospital Stay: Payer: Medicare Other

## 2018-11-23 VITALS — BP 141/63 | HR 74 | Temp 98.3°F | Resp 17 | Ht 61.0 in | Wt 140.4 lb

## 2018-11-23 DIAGNOSIS — D509 Iron deficiency anemia, unspecified: Secondary | ICD-10-CM

## 2018-11-23 DIAGNOSIS — D631 Anemia in chronic kidney disease: Secondary | ICD-10-CM | POA: Diagnosis not present

## 2018-11-23 DIAGNOSIS — D649 Anemia, unspecified: Secondary | ICD-10-CM

## 2018-11-23 DIAGNOSIS — Z7982 Long term (current) use of aspirin: Secondary | ICD-10-CM | POA: Diagnosis not present

## 2018-11-23 DIAGNOSIS — I129 Hypertensive chronic kidney disease with stage 1 through stage 4 chronic kidney disease, or unspecified chronic kidney disease: Secondary | ICD-10-CM | POA: Diagnosis not present

## 2018-11-23 DIAGNOSIS — N183 Chronic kidney disease, stage 3 (moderate): Secondary | ICD-10-CM | POA: Insufficient documentation

## 2018-11-23 DIAGNOSIS — N189 Chronic kidney disease, unspecified: Secondary | ICD-10-CM

## 2018-11-23 DIAGNOSIS — Z79899 Other long term (current) drug therapy: Secondary | ICD-10-CM | POA: Diagnosis not present

## 2018-11-23 LAB — CBC WITH DIFFERENTIAL (CANCER CENTER ONLY)
Abs Immature Granulocytes: 0.02 10*3/uL (ref 0.00–0.07)
Basophils Absolute: 0 10*3/uL (ref 0.0–0.1)
Basophils Relative: 0 %
Eosinophils Absolute: 0.1 10*3/uL (ref 0.0–0.5)
Eosinophils Relative: 1 %
HCT: 23.7 % — ABNORMAL LOW (ref 36.0–46.0)
Hemoglobin: 7.3 g/dL — ABNORMAL LOW (ref 12.0–15.0)
Immature Granulocytes: 0 %
Lymphocytes Relative: 8 %
Lymphs Abs: 0.7 10*3/uL (ref 0.7–4.0)
MCH: 28.3 pg (ref 26.0–34.0)
MCHC: 30.8 g/dL (ref 30.0–36.0)
MCV: 91.9 fL (ref 80.0–100.0)
Monocytes Absolute: 0.7 10*3/uL (ref 0.1–1.0)
Monocytes Relative: 10 %
Neutro Abs: 6.2 10*3/uL (ref 1.7–7.7)
Neutrophils Relative %: 81 %
Platelet Count: 289 10*3/uL (ref 150–400)
RBC: 2.58 MIL/uL — ABNORMAL LOW (ref 3.87–5.11)
RDW: 17.3 % — ABNORMAL HIGH (ref 11.5–15.5)
WBC Count: 7.7 10*3/uL (ref 4.0–10.5)
nRBC: 0 % (ref 0.0–0.2)

## 2018-11-23 LAB — PREPARE RBC (CROSSMATCH)

## 2018-11-23 LAB — SAMPLE TO BLOOD BANK

## 2018-11-23 MED ORDER — DARBEPOETIN ALFA 300 MCG/0.6ML IJ SOSY
300.0000 ug | PREFILLED_SYRINGE | Freq: Once | INTRAMUSCULAR | Status: AC
  Administered 2018-11-23: 300 ug via SUBCUTANEOUS

## 2018-11-23 MED ORDER — DARBEPOETIN ALFA 300 MCG/0.6ML IJ SOSY
PREFILLED_SYRINGE | INTRAMUSCULAR | Status: AC
  Filled 2018-11-23: qty 0.6

## 2018-11-23 NOTE — Telephone Encounter (Signed)
Spoke to [place] in blood bank. She could not see prepare order. Order re-entered with today's date and released. Confirmed order received. Patient aware not to remove blue blood bank bracelet and aware of tomorrow's appointment for 1 unit of blood and iron.

## 2018-11-23 NOTE — Progress Notes (Signed)
Hematology and Oncology Follow Up Visit  Lisa [name] [id number] Apr 22, 1923 85 y.o. 11/23/2018 4:01 PM Leighton Ruff, MDBarnes, [name], MD   Principle Diagnosis: 85 year old woman with multifactorial anemia related to iron deficiency, chronic renal insufficiency and chronic disease diagnosed in 2018.      Prior Therapy: IV iron infusion infusion as well as packed red cell transfusion.  Current therapy:   Aranesp 300 mcg every 3 weeks    Feraheme at 1000 mg every 9 weeks.  Supportive packed red cell transfusion for hemoglobin below 8.    Interim History: [name] is here for a follow-up.  Since her last visit, he reports no major changes in her health.  He has tolerated Aranesp, IV iron and packed red cell transfusion without any major complications.  He remains mobile utilizing a walker without any recent falls or syncope.  Her appetite and performance status is unchanged.  He denies any hematochezia melena or hemoptysis.  He did have one episode of loose bowel movements with blood tinged mucus noted.  Patient denied headaches, blurry vision, syncope or seizures.  Denies any fevers, chills or sweats.  Denied chest pain, palpitation, orthopnea or leg edema.  Denied cough, wheezing or hemoptysis.  Denied nausea, vomiting or abdominal pain.  Denies any constipation or diarrhea.  Denies any frequency urgency or hesitancy.  Denies any arthralgias or myalgias.  Denies any skin rashes or lesions.  Denies any bleeding or clotting tendency.  Denies any easy bruising.  Denies any hair or nail changes.  Denies any anxiety or depression.  Remaining review of system is negative.            Medications: Reviewed without changes. Current Outpatient Medications  Medication Sig Dispense Refill  . acetaminophen (TYLENOL) 500 MG tablet Take 500-1,000 mg by mouth at bedtime.    Marland Kitchen aspirin EC 81 MG tablet Take 81 mg by mouth daily.    . calcium carbonate (OS-CAL) 600 MG TABS Take 600 mg  by mouth daily.     . ferrous sulfate 325 (65 FE) MG tablet Take 325 mg by mouth daily with breakfast.    . loratadine (CLARITIN) 10 MG tablet Take 10 mg by mouth daily as needed.     . Multiple Vitamins-Minerals (MULTIVITAMIN WITH MINERALS) tablet Take 1 tablet by mouth daily.    Marland Kitchen OVER THE COUNTER MEDICATION Apply topically as needed. Cream for arthritic pain. Patient unable to recall the name at this time.    Marland Kitchen oxybutynin (DITROPAN) 5 MG tablet Take 2.5 mg by mouth 2 (two) times daily.  2  . pramipexole (MIRAPEX) 0.25 MG tablet Take 0.25 mg by mouth at bedtime.    . primidone (MYSOLINE) 50 MG tablet Take 3 tablets (150 mg total) by mouth at bedtime. 270 tablet 3  . raloxifene (EVISTA) 60 MG tablet Take 60 mg by mouth at bedtime.     . timolol (TIMOPTIC) 0.5 % ophthalmic solution Place 1 drop into both eyes daily.   1  . UNABLE TO FIND Med Name: Aranesp infusions every 3 weeks administered by [facility]    . VYTORIN 10-20 MG tablet Take 1 tablet by mouth daily.  0   No current facility-administered medications for this visit.      Allergies:  Allergies  Allergen Reactions  . Mycostatin [Nystatin] Swelling and Rash    Past Medical History, Surgical history, Social history, and Family History unchanged on review  Physical Exam:     Blood pressure Marland Kitchen)  141/63, pulse 74, temperature 98.3 F (36.8 C), temperature source Oral, resp. rate 17, height 5\' 1"  (1.549 m), weight 140 lb 6.4 oz (63.7 kg), SpO2 99 %.     ECOG: 1    General appearance: Alert, awake without any distress. Head: Atraumatic without abnormalities Oropharynx: Without any thrush or ulcers. Eyes: No scleral icterus. Lymph nodes: No lymphadenopathy noted in the cervical, supraclavicular, or axillary nodes Heart:regular rate and rhythm, without any murmurs or gallops.   Lung: Clear to auscultation without any rhonchi, wheezes or dullness to percussion. Abdomin: Soft, nontender without any shifting  dullness or ascites. Musculoskeletal: No clubbing or cyanosis. Neurological: No motor or sensory deficits. Skin: No rashes or lesions.       Lab Results: Lab Results  Component Value Date   WBC 7.7 11/23/2018   HGB 7.3 (L) 11/23/2018   HCT 23.7 (L) 11/23/2018   MCV 91.9 11/23/2018   PLT 289 11/23/2018     Chemistry      Component Value Date/Time   NA 138 11/30/2017 1435   NA 135 (A) 04/01/2016   K 4.4 11/30/2017 1435   CL 103 11/30/2017 1435   CO2 30 11/30/2017 1435   BUN 21 11/30/2017 1435   BUN 19 04/01/2016   CREATININE 0.88 11/30/2017 1435   GLU 102 04/01/2016      Component Value Date/Time   CALCIUM 8.6 (L) 11/30/2017 1435   ALKPHOS 74 11/30/2017 1435   AST 17 11/30/2017 1435   ALT 16 11/30/2017 1435   BILITOT <0.2 (L) 11/30/2017 1435       Impression and Plan:  85 year old woman with  1.  Anemia diagnosed in 2018.  Etiology is related to renal insufficiency as well as iron deficiency.   The natural course of this disease was discussed today with the patient face-to-face and with her daughter via phone.  Treatment options were also reviewed.  At this time I have recommended continue current management approach with growth factor support every 3 weeks and packed red cell transfusion as needed to keep her hemoglobin above 8.  She has been requiring 1 to 2 units every 9 weeks.  Her hemoglobin today is 7.2 and she will receive 1 unit of packed red cells.  2.  Iron deficiency: Iron level on October 11, 2018 was 40 with ferritin of 252.  Risks and benefits of retreatment with Duncan DullFeraheme was reviewed today.  Potential complications include arthralgias, myalgias and anaphylaxis updated.  After discussion today she is agreeable to proceed if her iron studies do indicate iron deficiency which anticipate that.       3.  Follow-up: He will return on 11/24/2018 for 1 unit of packed red cells as well as Feraheme infusion.  He will have repeat Feraheme infusion on a week  of September 2019.  She will continue to follow-up every 3 weeks for Aranesp and MD follow-up in 9 weeks.   25  minutes was spent with the patient face-to-face today.  More than 50% of time was dedicated to discussing the natural course of her disease, treatment options and complications related to current therapy.      Eli HoseFiras Sahara Fujimoto, MD 9/17/20204:01 PM

## 2018-11-24 ENCOUNTER — Telehealth: Payer: Self-pay

## 2018-11-24 ENCOUNTER — Inpatient Hospital Stay: Payer: Medicare Other

## 2018-11-24 ENCOUNTER — Other Ambulatory Visit: Payer: Self-pay

## 2018-11-24 VITALS — BP 132/64 | HR 89 | Temp 98.9°F | Resp 17

## 2018-11-24 DIAGNOSIS — D509 Iron deficiency anemia, unspecified: Secondary | ICD-10-CM | POA: Diagnosis not present

## 2018-11-24 DIAGNOSIS — Z7982 Long term (current) use of aspirin: Secondary | ICD-10-CM | POA: Diagnosis not present

## 2018-11-24 DIAGNOSIS — D631 Anemia in chronic kidney disease: Secondary | ICD-10-CM | POA: Diagnosis not present

## 2018-11-24 DIAGNOSIS — N183 Chronic kidney disease, stage 3 (moderate): Secondary | ICD-10-CM | POA: Diagnosis not present

## 2018-11-24 DIAGNOSIS — D649 Anemia, unspecified: Secondary | ICD-10-CM

## 2018-11-24 DIAGNOSIS — N189 Chronic kidney disease, unspecified: Secondary | ICD-10-CM

## 2018-11-24 DIAGNOSIS — I129 Hypertensive chronic kidney disease with stage 1 through stage 4 chronic kidney disease, or unspecified chronic kidney disease: Secondary | ICD-10-CM | POA: Diagnosis not present

## 2018-11-24 DIAGNOSIS — Z79899 Other long term (current) drug therapy: Secondary | ICD-10-CM | POA: Diagnosis not present

## 2018-11-24 LAB — FERRITIN: Ferritin: 16 ng/mL (ref 11–307)

## 2018-11-24 LAB — IRON AND TIBC
Iron: 26 ug/dL — ABNORMAL LOW (ref 41–142)
Saturation Ratios: 11 % — ABNORMAL LOW (ref 21–57)
TIBC: 248 ug/dL (ref 236–444)
UIBC: 221 ug/dL (ref 120–384)

## 2018-11-24 MED ORDER — ACETAMINOPHEN 325 MG PO TABS: 650.0000 mg | ORAL_TABLET | Freq: Once | ORAL | Status: DC

## 2018-11-24 MED ORDER — SODIUM CHLORIDE 0.9% IV SOLUTION
250.0000 mL | Freq: Once | INTRAVENOUS | Status: AC
  Administered 2018-11-24: 250 mL via INTRAVENOUS
  Filled 2018-11-24: qty 250

## 2018-11-24 MED ORDER — SODIUM CHLORIDE 0.9 % IV SOLN
510.0000 mg | Freq: Once | INTRAVENOUS | Status: AC
  Administered 2018-11-24: 510 mg via INTRAVENOUS
  Filled 2018-11-24: qty 17

## 2018-11-24 MED ORDER — DIPHENHYDRAMINE HCL 25 MG PO CAPS: 25.0000 mg | ORAL_CAPSULE | Freq: Once | ORAL | Status: DC

## 2018-11-24 MED ORDER — SODIUM CHLORIDE 0.9 % IV SOLN
Freq: Once | INTRAVENOUS | Status: AC
  Administered 2018-11-24: 12:00:00 via INTRAVENOUS
  Filled 2018-11-24: qty 250

## 2018-11-24 NOTE — Telephone Encounter (Signed)
Called patient's daughter and let her know that per Dr. Alen Blew, iron is low and patient will receive IV iron today and in 2 weeks. Patient's daughter verbalized understanding.

## 2018-11-24 NOTE — Patient Instructions (Addendum)
Ferumoxytol injection What is this medicine? FERUMOXYTOL is an iron complex. Iron is used to make healthy red blood cells, which carry oxygen and nutrients throughout the body. This medicine is used to treat iron deficiency anemia. This medicine may be used for other purposes; ask your health care provider or pharmacist if you have questions. COMMON BRAND NAME(S): Feraheme What should I tell my health care provider before I take this medicine? They need to know if you have any of these conditions:  anemia not caused by low iron levels  high levels of iron in the blood  magnetic resonance imaging (MRI) test scheduled  an unusual or allergic reaction to iron, other medicines, foods, dyes, or preservatives  pregnant or trying to get pregnant  breast-feeding How should I use this medicine? This medicine is for injection into a vein. It is given by a health care professional in a hospital or clinic setting. Talk to your pediatrician regarding the use of this medicine in children. Special care may be needed. Overdosage: If you think you have taken too much of this medicine contact a poison control center or emergency room at once. NOTE: This medicine is only for you. Do not share this medicine with others. What if I miss a dose? It is important not to miss your dose. Call your doctor or health care professional if you are unable to keep an appointment. What may interact with this medicine? This medicine may interact with the following medications:  other iron products This list may not describe all possible interactions. Give your health care provider a list of all the medicines, herbs, non-prescription drugs, or dietary supplements you use. Also tell them if you smoke, drink alcohol, or use illegal drugs. Some items may interact with your medicine. What should I watch for while using this medicine? Visit your doctor or healthcare professional regularly. Tell your doctor or healthcare  professional if your symptoms do not start to get better or if they get worse. You may need blood work done while you are taking this medicine. You may need to follow a special diet. Talk to your doctor. Foods that contain iron include: whole grains/cereals, dried fruits, beans, or peas, leafy green vegetables, and organ meats (liver, kidney). What side effects may I notice from receiving this medicine? Side effects that you should report to your doctor or health care professional as soon as possible:  allergic reactions like skin rash, itching or hives, swelling of the face, lips, or tongue  breathing problems  changes in blood pressure  feeling faint or lightheaded, falls  fever or chills  flushing, sweating, or hot feelings  swelling of the ankles or feet Side effects that usually do not require medical attention (report to your doctor or health care professional if they continue or are bothersome):  diarrhea  headache  nausea, vomiting  stomach pain This list may not describe all possible side effects. Call your doctor for medical advice about side effects. You may report side effects to FDA at 1-800-FDA-1088. Where should I keep my medicine? This drug is given in a hospital or clinic and will not be stored at home. NOTE: This sheet is a summary. It may not cover all possible information. If you have questions about this medicine, talk to your doctor, pharmacist, or health care provider.  2020 Elsevier/Gold Standard (2016-04-12 20:21:10)    Blood Transfusion, Adult, Care After This sheet gives you information about how to care for yourself after your procedure. Your doctor   may also give you more specific instructions. If you have problems or questions, contact your doctor. Follow these instructions at home:   Take over-the-counter and prescription medicines only as told by your doctor.  Go back to your normal activities as told by your doctor.  Follow instructions  from your doctor about how to take care of the area where an IV tube was put into your vein (insertion site). Make sure you: ? Wash your hands with soap and water before you change your bandage (dressing). If there is no soap and water, use hand sanitizer. ? Change your bandage as told by your doctor.  Check your IV insertion site every day for signs of infection. Check for: ? More redness, swelling, or pain. ? More fluid or blood. ? Warmth. ? Pus or a bad smell. Contact a doctor if:  You have more redness, swelling, or pain around the IV insertion site.  You have more fluid or blood coming from the IV insertion site.  Your IV insertion site feels warm to the touch.  You have pus or a bad smell coming from the IV insertion site.  Your pee (urine) turns pink, red, or brown.  You feel weak after doing your normal activities. Get help right away if:  You have signs of a serious allergic or body defense (immune) system reaction, including: ? Itchiness. ? Hives. ? Trouble breathing. ? Anxiety. ? Pain in your chest or lower back. ? Fever, flushing, and chills. ? Fast pulse. ? Rash. ? Watery poop (diarrhea). ? Throwing up (vomiting). ? Dark pee. ? Serious headache. ? Dizziness. ? Stiff neck. ? Yellow color in your face or the white parts of your eyes (jaundice). Summary  After a blood transfusion, return to your normal activities as told by your doctor.  Every day, check for signs of infection where the IV tube was put into your vein.  Some signs of infection are warm skin, more redness and pain, more fluid or blood, and pus or a bad smell where the needle went in.  Contact your doctor if you feel weak or have any unusual symptoms. This information is not intended to replace advice given to you by your health care provider. Make sure you discuss any questions you have with your health care provider. Document Released: 03/15/2014 Document Revised: 06/29/2017 Document  Reviewed: 10/17/2015 Elsevier Patient Education  2020 Elsevier Inc.  

## 2018-11-24 NOTE — Telephone Encounter (Signed)
-----   Message from Wyatt Portela, MD sent at 11/24/2018  9:23 AM EDT ----- Please let her daughter know her iron is low and will IV iron today and in 2 weeks. Thanks

## 2018-11-25 LAB — TYPE AND SCREEN
ABO/RH(D): A POS
Antibody Screen: NEGATIVE
Unit division: 0

## 2018-11-25 LAB — BPAM RBC
Blood Product Expiration Date: 202010172359
ISSUE DATE / TIME: 202009181320
Unit Type and Rh: 6200

## 2018-11-28 DIAGNOSIS — M79672 Pain in left foot: Secondary | ICD-10-CM | POA: Diagnosis not present

## 2018-11-28 DIAGNOSIS — B351 Tinea unguium: Secondary | ICD-10-CM | POA: Diagnosis not present

## 2018-11-28 DIAGNOSIS — M79671 Pain in right foot: Secondary | ICD-10-CM | POA: Diagnosis not present

## 2018-11-29 ENCOUNTER — Telehealth: Payer: Self-pay | Admitting: Oncology

## 2018-11-29 NOTE — Telephone Encounter (Signed)
Called and spoke with patients daughter. Confirmed dates and times of appts ° °

## 2018-12-04 ENCOUNTER — Ambulatory Visit: Payer: Medicare Other

## 2018-12-06 DIAGNOSIS — E782 Mixed hyperlipidemia: Secondary | ICD-10-CM | POA: Diagnosis not present

## 2018-12-06 DIAGNOSIS — I10 Essential (primary) hypertension: Secondary | ICD-10-CM | POA: Diagnosis not present

## 2018-12-06 DIAGNOSIS — Z23 Encounter for immunization: Secondary | ICD-10-CM | POA: Diagnosis not present

## 2018-12-06 DIAGNOSIS — R7301 Impaired fasting glucose: Secondary | ICD-10-CM | POA: Diagnosis not present

## 2018-12-07 ENCOUNTER — Inpatient Hospital Stay: Payer: Medicare Other | Attending: Oncology

## 2018-12-07 ENCOUNTER — Other Ambulatory Visit: Payer: Self-pay

## 2018-12-07 VITALS — BP 123/47 | HR 90 | Temp 98.6°F | Resp 18

## 2018-12-07 DIAGNOSIS — I129 Hypertensive chronic kidney disease with stage 1 through stage 4 chronic kidney disease, or unspecified chronic kidney disease: Secondary | ICD-10-CM | POA: Insufficient documentation

## 2018-12-07 DIAGNOSIS — D631 Anemia in chronic kidney disease: Secondary | ICD-10-CM | POA: Diagnosis not present

## 2018-12-07 DIAGNOSIS — Z79899 Other long term (current) drug therapy: Secondary | ICD-10-CM | POA: Diagnosis not present

## 2018-12-07 DIAGNOSIS — D509 Iron deficiency anemia, unspecified: Secondary | ICD-10-CM

## 2018-12-07 DIAGNOSIS — N189 Chronic kidney disease, unspecified: Secondary | ICD-10-CM | POA: Insufficient documentation

## 2018-12-07 DIAGNOSIS — D649 Anemia, unspecified: Secondary | ICD-10-CM

## 2018-12-07 MED ORDER — SODIUM CHLORIDE 0.9 % IV SOLN
Freq: Once | INTRAVENOUS | Status: AC
  Administered 2018-12-07: 15:00:00 via INTRAVENOUS
  Filled 2018-12-07: qty 250

## 2018-12-07 MED ORDER — SODIUM CHLORIDE 0.9 % IV SOLN
510.0000 mg | Freq: Once | INTRAVENOUS | Status: AC
  Administered 2018-12-07: 510 mg via INTRAVENOUS
  Filled 2018-12-07: qty 510

## 2018-12-07 NOTE — Patient Instructions (Signed)
Ferumoxytol injection What is this medicine? FERUMOXYTOL is an iron complex. Iron is used to make healthy red blood cells, which carry oxygen and nutrients throughout the body. This medicine is used to treat iron deficiency anemia. This medicine may be used for other purposes; ask your health care provider or pharmacist if you have questions. COMMON BRAND NAME(S): Feraheme What should I tell my health care provider before I take this medicine? They need to know if you have any of these conditions:  anemia not caused by low iron levels  high levels of iron in the blood  magnetic resonance imaging (MRI) test scheduled  an unusual or allergic reaction to iron, other medicines, foods, dyes, or preservatives  pregnant or trying to get pregnant  breast-feeding How should I use this medicine? This medicine is for injection into a vein. It is given by a health care professional in a hospital or clinic setting. Talk to your pediatrician regarding the use of this medicine in children. Special care may be needed. Overdosage: If you think you have taken too much of this medicine contact a poison control center or emergency room at once. NOTE: This medicine is only for you. Do not share this medicine with others. What if I miss a dose? It is important not to miss your dose. Call your doctor or health care professional if you are unable to keep an appointment. What may interact with this medicine? This medicine may interact with the following medications:  other iron products This list may not describe all possible interactions. Give your health care provider a list of all the medicines, herbs, non-prescription drugs, or dietary supplements you use. Also tell them if you smoke, drink alcohol, or use illegal drugs. Some items may interact with your medicine. What should I watch for while using this medicine? Visit your doctor or healthcare professional regularly. Tell your doctor or healthcare  professional if your symptoms do not start to get better or if they get worse. You may need blood work done while you are taking this medicine. You may need to follow a special diet. Talk to your doctor. Foods that contain iron include: whole grains/cereals, dried fruits, beans, or peas, leafy green vegetables, and organ meats (liver, kidney). What side effects may I notice from receiving this medicine? Side effects that you should report to your doctor or health care professional as soon as possible:  allergic reactions like skin rash, itching or hives, swelling of the face, lips, or tongue  breathing problems  changes in blood pressure  feeling faint or lightheaded, falls  fever or chills  flushing, sweating, or hot feelings  swelling of the ankles or feet Side effects that usually do not require medical attention (report to your doctor or health care professional if they continue or are bothersome):  diarrhea  headache  nausea, vomiting  stomach pain This list may not describe all possible side effects. Call your doctor for medical advice about side effects. You may report side effects to FDA at 1-800-FDA-1088. Where should I keep my medicine? This drug is given in a hospital or clinic and will not be stored at home. NOTE: This sheet is a summary. It may not cover all possible information. If you have questions about this medicine, talk to your doctor, pharmacist, or health care provider.  2020 Elsevier/Gold Standard (2016-04-12 20:21:10)    Blood Transfusion, Adult, Care After This sheet gives you information about how to care for yourself after your procedure. Your doctor   may also give you more specific instructions. If you have problems or questions, contact your doctor. Follow these instructions at home:   Take over-the-counter and prescription medicines only as told by your doctor.  Go back to your normal activities as told by your doctor.  Follow instructions  from your doctor about how to take care of the area where an IV tube was put into your vein (insertion site). Make sure you: ? Wash your hands with soap and water before you change your bandage (dressing). If there is no soap and water, use hand sanitizer. ? Change your bandage as told by your doctor.  Check your IV insertion site every day for signs of infection. Check for: ? More redness, swelling, or pain. ? More fluid or blood. ? Warmth. ? Pus or a bad smell. Contact a doctor if:  You have more redness, swelling, or pain around the IV insertion site.  You have more fluid or blood coming from the IV insertion site.  Your IV insertion site feels warm to the touch.  You have pus or a bad smell coming from the IV insertion site.  Your pee (urine) turns pink, red, or brown.  You feel weak after doing your normal activities. Get help right away if:  You have signs of a serious allergic or body defense (immune) system reaction, including: ? Itchiness. ? Hives. ? Trouble breathing. ? Anxiety. ? Pain in your chest or lower back. ? Fever, flushing, and chills. ? Fast pulse. ? Rash. ? Watery poop (diarrhea). ? Throwing up (vomiting). ? Dark pee. ? Serious headache. ? Dizziness. ? Stiff neck. ? Yellow color in your face or the white parts of your eyes (jaundice). Summary  After a blood transfusion, return to your normal activities as told by your doctor.  Every day, check for signs of infection where the IV tube was put into your vein.  Some signs of infection are warm skin, more redness and pain, more fluid or blood, and pus or a bad smell where the needle went in.  Contact your doctor if you feel weak or have any unusual symptoms. This information is not intended to replace advice given to you by your health care provider. Make sure you discuss any questions you have with your health care provider. Document Released: 03/15/2014 Document Revised: 06/29/2017 Document  Reviewed: 10/17/2015 Elsevier Patient Education  2020 Elsevier Inc.  

## 2018-12-14 ENCOUNTER — Ambulatory Visit: Payer: Medicare Other

## 2018-12-14 ENCOUNTER — Inpatient Hospital Stay: Payer: Medicare Other

## 2018-12-14 ENCOUNTER — Other Ambulatory Visit: Payer: Medicare Other

## 2018-12-14 ENCOUNTER — Other Ambulatory Visit: Payer: Self-pay

## 2018-12-14 VITALS — BP 138/60 | HR 90 | Temp 98.3°F | Resp 18

## 2018-12-14 DIAGNOSIS — Z79899 Other long term (current) drug therapy: Secondary | ICD-10-CM | POA: Diagnosis not present

## 2018-12-14 DIAGNOSIS — D631 Anemia in chronic kidney disease: Secondary | ICD-10-CM | POA: Diagnosis not present

## 2018-12-14 DIAGNOSIS — I129 Hypertensive chronic kidney disease with stage 1 through stage 4 chronic kidney disease, or unspecified chronic kidney disease: Secondary | ICD-10-CM | POA: Diagnosis not present

## 2018-12-14 DIAGNOSIS — D509 Iron deficiency anemia, unspecified: Secondary | ICD-10-CM

## 2018-12-14 DIAGNOSIS — D649 Anemia, unspecified: Secondary | ICD-10-CM

## 2018-12-14 DIAGNOSIS — N189 Chronic kidney disease, unspecified: Secondary | ICD-10-CM

## 2018-12-14 LAB — CBC WITH DIFFERENTIAL (CANCER CENTER ONLY)
Abs Immature Granulocytes: 0.06 10*3/uL (ref 0.00–0.07)
Basophils Absolute: 0 10*3/uL (ref 0.0–0.1)
Basophils Relative: 0 %
Eosinophils Absolute: 0.1 10*3/uL (ref 0.0–0.5)
Eosinophils Relative: 2 %
HCT: 28.2 % — ABNORMAL LOW (ref 36.0–46.0)
Hemoglobin: 8.8 g/dL — ABNORMAL LOW (ref 12.0–15.0)
Immature Granulocytes: 1 %
Lymphocytes Relative: 8 %
Lymphs Abs: 0.6 10*3/uL — ABNORMAL LOW (ref 0.7–4.0)
MCH: 30.6 pg (ref 26.0–34.0)
MCHC: 31.2 g/dL (ref 30.0–36.0)
MCV: 97.9 fL (ref 80.0–100.0)
Monocytes Absolute: 0.7 10*3/uL (ref 0.1–1.0)
Monocytes Relative: 9 %
Neutro Abs: 5.4 10*3/uL (ref 1.7–7.7)
Neutrophils Relative %: 80 %
Platelet Count: 253 10*3/uL (ref 150–400)
RBC: 2.88 MIL/uL — ABNORMAL LOW (ref 3.87–5.11)
RDW: 19.9 % — ABNORMAL HIGH (ref 11.5–15.5)
WBC Count: 6.9 10*3/uL (ref 4.0–10.5)
nRBC: 0 % (ref 0.0–0.2)

## 2018-12-14 LAB — SAMPLE TO BLOOD BANK

## 2018-12-14 MED ORDER — DARBEPOETIN ALFA 300 MCG/0.6ML IJ SOSY
300.0000 ug | PREFILLED_SYRINGE | Freq: Once | INTRAMUSCULAR | Status: AC
  Administered 2018-12-14: 300 ug via SUBCUTANEOUS

## 2018-12-14 NOTE — Patient Instructions (Signed)
Darbepoetin Alfa injection What is this medicine? DARBEPOETIN ALFA (dar be POE e tin AL fa) helps your body make more red blood cells. It is used to treat anemia caused by chronic kidney failure and chemotherapy. This medicine may be used for other purposes; ask your health care provider or pharmacist if you have questions. COMMON BRAND NAME(S): Aranesp What should I tell my health care provider before I take this medicine? They need to know if you have any of these conditions:  blood clotting disorders or history of blood clots  cancer patient not on chemotherapy  cystic fibrosis  heart disease, such as angina, heart failure, or a history of a heart attack  hemoglobin level of 12 g/dL or greater  high blood pressure  low levels of folate, iron, or vitamin B12  seizures  an unusual or allergic reaction to darbepoetin, erythropoietin, albumin, hamster proteins, latex, other medicines, foods, dyes, or preservatives  pregnant or trying to get pregnant  breast-feeding How should I use this medicine? This medicine is for injection into a vein or under the skin. It is usually given by a health care professional in a hospital or clinic setting. If you get this medicine at home, you will be taught how to prepare and give this medicine. Use exactly as directed. Take your medicine at regular intervals. Do not take your medicine more often than directed. It is important that you put your used needles and syringes in a special sharps container. Do not put them in a trash can. If you do not have a sharps container, call your pharmacist or healthcare provider to get one. A special MedGuide will be given to you by the pharmacist with each prescription and refill. Be sure to read this information carefully each time. Talk to your pediatrician regarding the use of this medicine in children. While this medicine may be used in children as young as 1 month of age for selected conditions, precautions do  apply. Overdosage: If you think you have taken too much of this medicine contact a poison control center or emergency room at once. NOTE: This medicine is only for you. Do not share this medicine with others. What if I miss a dose? If you miss a dose, take it as soon as you can. If it is almost time for your next dose, take only that dose. Do not take double or extra doses. What may interact with this medicine? Do not take this medicine with any of the following medications:  epoetin alfa This list may not describe all possible interactions. Give your health care provider a list of all the medicines, herbs, non-prescription drugs, or dietary supplements you use. Also tell them if you smoke, drink alcohol, or use illegal drugs. Some items may interact with your medicine. What should I watch for while using this medicine? Your condition will be monitored carefully while you are receiving this medicine. You may need blood work done while you are taking this medicine. This medicine may cause a decrease in vitamin B6. You should make sure that you get enough vitamin B6 while you are taking this medicine. Discuss the foods you eat and the vitamins you take with your health care professional. What side effects may I notice from receiving this medicine? Side effects that you should report to your doctor or health care professional as soon as possible:  allergic reactions like skin rash, itching or hives, swelling of the face, lips, or tongue  breathing problems  changes in   vision  chest pain  confusion, trouble speaking or understanding  feeling faint or lightheaded, falls  high blood pressure  muscle aches or pains  pain, swelling, warmth in the leg  rapid weight gain  severe headaches  sudden numbness or weakness of the face, arm or leg  trouble walking, dizziness, loss of balance or coordination  seizures (convulsions)  swelling of the ankles, feet, hands  unusually weak or  tired Side effects that usually do not require medical attention (report to your doctor or health care professional if they continue or are bothersome):  diarrhea  fever, chills (flu-like symptoms)  headaches  nausea, vomiting  redness, stinging, or swelling at site where injected This list may not describe all possible side effects. Call your doctor for medical advice about side effects. You may report side effects to FDA at 1-800-FDA-1088. Where should I keep my medicine? Keep out of the reach of children. Store in a refrigerator between 2 and 8 degrees C (36 and 46 degrees F). Do not freeze. Do not shake. Throw away any unused portion if using a single-dose vial. Throw away any unused medicine after the expiration date. NOTE: This sheet is a summary. It may not cover all possible information. If you have questions about this medicine, talk to your doctor, pharmacist, or health care provider.  2020 Elsevier/Gold Standard (2017-03-09 16:44:20)  

## 2019-01-03 ENCOUNTER — Inpatient Hospital Stay: Payer: Medicare Other

## 2019-01-03 ENCOUNTER — Other Ambulatory Visit: Payer: Self-pay

## 2019-01-03 VITALS — BP 117/61 | HR 100 | Temp 98.3°F | Resp 18

## 2019-01-03 DIAGNOSIS — D631 Anemia in chronic kidney disease: Secondary | ICD-10-CM

## 2019-01-03 DIAGNOSIS — D509 Iron deficiency anemia, unspecified: Secondary | ICD-10-CM

## 2019-01-03 DIAGNOSIS — D649 Anemia, unspecified: Secondary | ICD-10-CM

## 2019-01-03 DIAGNOSIS — N189 Chronic kidney disease, unspecified: Secondary | ICD-10-CM | POA: Diagnosis not present

## 2019-01-03 DIAGNOSIS — Z79899 Other long term (current) drug therapy: Secondary | ICD-10-CM | POA: Diagnosis not present

## 2019-01-03 DIAGNOSIS — I129 Hypertensive chronic kidney disease with stage 1 through stage 4 chronic kidney disease, or unspecified chronic kidney disease: Secondary | ICD-10-CM | POA: Diagnosis not present

## 2019-01-03 LAB — CBC WITH DIFFERENTIAL (CANCER CENTER ONLY)
Abs Immature Granulocytes: 0.02 10*3/uL (ref 0.00–0.07)
Basophils Absolute: 0.1 10*3/uL (ref 0.0–0.1)
Basophils Relative: 1 %
Eosinophils Absolute: 0.1 10*3/uL (ref 0.0–0.5)
Eosinophils Relative: 2 %
HCT: 28.4 % — ABNORMAL LOW (ref 36.0–46.0)
Hemoglobin: 8.9 g/dL — ABNORMAL LOW (ref 12.0–15.0)
Immature Granulocytes: 0 %
Lymphocytes Relative: 7 %
Lymphs Abs: 0.6 10*3/uL — ABNORMAL LOW (ref 0.7–4.0)
MCH: 31 pg (ref 26.0–34.0)
MCHC: 31.3 g/dL (ref 30.0–36.0)
MCV: 99 fL (ref 80.0–100.0)
Monocytes Absolute: 0.8 10*3/uL (ref 0.1–1.0)
Monocytes Relative: 9 %
Neutro Abs: 6.7 10*3/uL (ref 1.7–7.7)
Neutrophils Relative %: 81 %
Platelet Count: 269 10*3/uL (ref 150–400)
RBC: 2.87 MIL/uL — ABNORMAL LOW (ref 3.87–5.11)
RDW: 17.8 % — ABNORMAL HIGH (ref 11.5–15.5)
WBC Count: 8.3 10*3/uL (ref 4.0–10.5)
nRBC: 0 % (ref 0.0–0.2)

## 2019-01-03 LAB — SAMPLE TO BLOOD BANK

## 2019-01-03 MED ORDER — DARBEPOETIN ALFA 300 MCG/0.6ML IJ SOSY
300.0000 ug | PREFILLED_SYRINGE | Freq: Once | INTRAMUSCULAR | Status: AC
  Administered 2019-01-03: 300 ug via SUBCUTANEOUS

## 2019-01-03 MED ORDER — DARBEPOETIN ALFA 300 MCG/0.6ML IJ SOSY
PREFILLED_SYRINGE | INTRAMUSCULAR | Status: AC
  Filled 2019-01-03: qty 0.6

## 2019-01-03 NOTE — Patient Instructions (Signed)
Darbepoetin Alfa injection What is this medicine? DARBEPOETIN ALFA (dar be POE e tin AL fa) helps your body make more red blood cells. It is used to treat anemia caused by chronic kidney failure and chemotherapy. This medicine may be used for other purposes; ask your health care provider or pharmacist if you have questions. COMMON BRAND NAME(S): Aranesp What should I tell my health care provider before I take this medicine? They need to know if you have any of these conditions:  blood clotting disorders or history of blood clots  cancer patient not on chemotherapy  cystic fibrosis  heart disease, such as angina, heart failure, or a history of a heart attack  hemoglobin level of 12 g/dL or greater  high blood pressure  low levels of folate, iron, or vitamin B12  seizures  an unusual or allergic reaction to darbepoetin, erythropoietin, albumin, hamster proteins, latex, other medicines, foods, dyes, or preservatives  pregnant or trying to get pregnant  breast-feeding How should I use this medicine? This medicine is for injection into a vein or under the skin. It is usually given by a health care professional in a hospital or clinic setting. If you get this medicine at home, you will be taught how to prepare and give this medicine. Use exactly as directed. Take your medicine at regular intervals. Do not take your medicine more often than directed. It is important that you put your used needles and syringes in a special sharps container. Do not put them in a trash can. If you do not have a sharps container, call your pharmacist or healthcare provider to get one. A special MedGuide will be given to you by the pharmacist with each prescription and refill. Be sure to read this information carefully each time. Talk to your pediatrician regarding the use of this medicine in children. While this medicine may be used in children as young as 1 month of age for selected conditions, precautions do  apply. Overdosage: If you think you have taken too much of this medicine contact a poison control center or emergency room at once. NOTE: This medicine is only for you. Do not share this medicine with others. What if I miss a dose? If you miss a dose, take it as soon as you can. If it is almost time for your next dose, take only that dose. Do not take double or extra doses. What may interact with this medicine? Do not take this medicine with any of the following medications:  epoetin alfa This list may not describe all possible interactions. Give your health care provider a list of all the medicines, herbs, non-prescription drugs, or dietary supplements you use. Also tell them if you smoke, drink alcohol, or use illegal drugs. Some items may interact with your medicine. What should I watch for while using this medicine? Your condition will be monitored carefully while you are receiving this medicine. You may need blood work done while you are taking this medicine. This medicine may cause a decrease in vitamin B6. You should make sure that you get enough vitamin B6 while you are taking this medicine. Discuss the foods you eat and the vitamins you take with your health care professional. What side effects may I notice from receiving this medicine? Side effects that you should report to your doctor or health care professional as soon as possible:  allergic reactions like skin rash, itching or hives, swelling of the face, lips, or tongue  breathing problems  changes in   vision  chest pain  confusion, trouble speaking or understanding  feeling faint or lightheaded, falls  high blood pressure  muscle aches or pains  pain, swelling, warmth in the leg  rapid weight gain  severe headaches  sudden numbness or weakness of the face, arm or leg  trouble walking, dizziness, loss of balance or coordination  seizures (convulsions)  swelling of the ankles, feet, hands  unusually weak or  tired Side effects that usually do not require medical attention (report to your doctor or health care professional if they continue or are bothersome):  diarrhea  fever, chills (flu-like symptoms)  headaches  nausea, vomiting  redness, stinging, or swelling at site where injected This list may not describe all possible side effects. Call your doctor for medical advice about side effects. You may report side effects to FDA at 1-800-FDA-1088. Where should I keep my medicine? Keep out of the reach of children. Store in a refrigerator between 2 and 8 degrees C (36 and 46 degrees F). Do not freeze. Do not shake. Throw away any unused portion if using a single-dose vial. Throw away any unused medicine after the expiration date. NOTE: This sheet is a summary. It may not cover all possible information. If you have questions about this medicine, talk to your doctor, pharmacist, or health care provider.  2020 Elsevier/Gold Standard (2017-03-09 16:44:20)  

## 2019-01-04 ENCOUNTER — Ambulatory Visit: Payer: Medicare Other

## 2019-01-04 ENCOUNTER — Other Ambulatory Visit: Payer: Medicare Other

## 2019-01-04 LAB — FERRITIN: Ferritin: 65 ng/mL (ref 11–307)

## 2019-01-04 LAB — IRON AND TIBC
Iron: 34 ug/dL — ABNORMAL LOW (ref 41–142)
Saturation Ratios: 15 % — ABNORMAL LOW (ref 21–57)
TIBC: 219 ug/dL — ABNORMAL LOW (ref 236–444)
UIBC: 185 ug/dL (ref 120–384)

## 2019-01-24 ENCOUNTER — Inpatient Hospital Stay (HOSPITAL_BASED_OUTPATIENT_CLINIC_OR_DEPARTMENT_OTHER): Payer: Medicare Other | Admitting: Oncology

## 2019-01-24 ENCOUNTER — Other Ambulatory Visit: Payer: Self-pay

## 2019-01-24 ENCOUNTER — Inpatient Hospital Stay: Payer: Medicare Other | Attending: Oncology

## 2019-01-24 ENCOUNTER — Inpatient Hospital Stay: Payer: Medicare Other

## 2019-01-24 VITALS — BP 152/72 | HR 105 | Temp 98.3°F | Resp 18 | Ht 61.0 in | Wt 140.3 lb

## 2019-01-24 DIAGNOSIS — Z7982 Long term (current) use of aspirin: Secondary | ICD-10-CM | POA: Insufficient documentation

## 2019-01-24 DIAGNOSIS — D631 Anemia in chronic kidney disease: Secondary | ICD-10-CM | POA: Diagnosis not present

## 2019-01-24 DIAGNOSIS — N189 Chronic kidney disease, unspecified: Secondary | ICD-10-CM | POA: Insufficient documentation

## 2019-01-24 DIAGNOSIS — D649 Anemia, unspecified: Secondary | ICD-10-CM

## 2019-01-24 DIAGNOSIS — R5383 Other fatigue: Secondary | ICD-10-CM | POA: Diagnosis not present

## 2019-01-24 DIAGNOSIS — Z79899 Other long term (current) drug therapy: Secondary | ICD-10-CM | POA: Diagnosis not present

## 2019-01-24 DIAGNOSIS — I129 Hypertensive chronic kidney disease with stage 1 through stage 4 chronic kidney disease, or unspecified chronic kidney disease: Secondary | ICD-10-CM | POA: Insufficient documentation

## 2019-01-24 DIAGNOSIS — D509 Iron deficiency anemia, unspecified: Secondary | ICD-10-CM

## 2019-01-24 LAB — CBC WITH DIFFERENTIAL (CANCER CENTER ONLY)
Abs Immature Granulocytes: 0.03 10*3/uL (ref 0.00–0.07)
Basophils Absolute: 0.1 10*3/uL (ref 0.0–0.1)
Basophils Relative: 1 %
Eosinophils Absolute: 0.1 10*3/uL (ref 0.0–0.5)
Eosinophils Relative: 1 %
HCT: 25.6 % — ABNORMAL LOW (ref 36.0–46.0)
Hemoglobin: 8 g/dL — ABNORMAL LOW (ref 12.0–15.0)
Immature Granulocytes: 0 %
Lymphocytes Relative: 10 %
Lymphs Abs: 0.7 10*3/uL (ref 0.7–4.0)
MCH: 29.3 pg (ref 26.0–34.0)
MCHC: 31.3 g/dL (ref 30.0–36.0)
MCV: 93.8 fL (ref 80.0–100.0)
Monocytes Absolute: 0.7 10*3/uL (ref 0.1–1.0)
Monocytes Relative: 11 %
Neutro Abs: 5.4 10*3/uL (ref 1.7–7.7)
Neutrophils Relative %: 77 %
Platelet Count: 298 10*3/uL (ref 150–400)
RBC: 2.73 MIL/uL — ABNORMAL LOW (ref 3.87–5.11)
RDW: 17 % — ABNORMAL HIGH (ref 11.5–15.5)
WBC Count: 7 10*3/uL (ref 4.0–10.5)
nRBC: 0 % (ref 0.0–0.2)

## 2019-01-24 LAB — SAMPLE TO BLOOD BANK

## 2019-01-24 MED ORDER — DARBEPOETIN ALFA 300 MCG/0.6ML IJ SOSY
PREFILLED_SYRINGE | INTRAMUSCULAR | Status: AC
  Filled 2019-01-24: qty 0.6

## 2019-01-24 MED ORDER — DARBEPOETIN ALFA 300 MCG/0.6ML IJ SOSY
300.0000 ug | PREFILLED_SYRINGE | Freq: Once | INTRAMUSCULAR | Status: AC
  Administered 2019-01-24: 300 ug via SUBCUTANEOUS

## 2019-01-24 NOTE — Progress Notes (Signed)
Hematology and Oncology Follow Up Visit  Lisa [name] [id number] Mar 16, 1923 85 y.o. 01/24/2019 3:49 PM Lisa [name], MDBarnes, [name], MD   Principle Diagnosis: 85 year old woman with anemia related to chronic renal insufficiency, iron deficiency as well as chronic disease diagnosed in 2018.        Prior Therapy: IV iron infusion infusion as well as packed red cell transfusion.  Current therapy:   Aranesp 300 mcg every 3 weeks    Feraheme at 1000 mg every 6 to 9 weeks.  Supportive packed red cell transfusion for hemoglobin below 8.    Interim History: [name] is here for repeat evaluation.  Since the last visit, she reports increased fatigue since the last visit.  She denies any chest pain, shortness of breath or difficulty breathing.  She denies any cough or wheezing.  She does report some stomach upset at times associated with loose bowel habits.  She denies any nausea or vomiting.  She denied any alteration mental status, neuropathy, confusion or dizziness.  Denies any headaches or lethargy.  Denies any night sweats, weight loss or changes in appetite.  Denied orthopnea, dyspnea on exertion or chest discomfort.  Denies shortness of breath, difficulty breathing hemoptysis or cough.  Denies any abdominal distention, nausea, early satiety or dyspepsia.  Denies any hematuria, frequency, dysuria or nocturia.  Denies any skin irritation, dryness or rash.  Denies any ecchymosis or petechiae.  Denies any lymphadenopathy or clotting.  Denies any heat or cold intolerance.  Denies any anxiety or depression.  Remaining review of system is negative.                 Medications: Unchanged on review. Current Outpatient Medications  Medication Sig Dispense Refill  . acetaminophen (TYLENOL) 500 MG tablet Take 500-1,000 mg by mouth at bedtime.    Marland Kitchen aspirin EC 81 MG tablet Take 81 mg by mouth daily.    . calcium carbonate (OS-CAL) 600 MG TABS Take 600 mg by mouth daily.      . ferrous sulfate 325 (65 FE) MG tablet Take 325 mg by mouth daily with breakfast.    . loratadine (CLARITIN) 10 MG tablet Take 10 mg by mouth daily as needed.     . Multiple Vitamins-Minerals (MULTIVITAMIN WITH MINERALS) tablet Take 1 tablet by mouth daily.    Marland Kitchen OVER THE COUNTER MEDICATION Apply topically as needed. Cream for arthritic pain. Patient unable to recall the name at this time.    Marland Kitchen oxybutynin (DITROPAN) 5 MG tablet Take 2.5 mg by mouth 2 (two) times daily.  2  . pramipexole (MIRAPEX) 0.25 MG tablet Take 0.25 mg by mouth at bedtime.    . primidone (MYSOLINE) 50 MG tablet Take 3 tablets (150 mg total) by mouth at bedtime. 270 tablet 3  . raloxifene (EVISTA) 60 MG tablet Take 60 mg by mouth at bedtime.     . timolol (TIMOPTIC) 0.5 % ophthalmic solution Place 1 drop into both eyes daily.   1  . UNABLE TO FIND Med Name: Aranesp infusions every 3 weeks administered by [facility]    . VYTORIN 10-20 MG tablet Take 1 tablet by mouth daily.  0   No current facility-administered medications for this visit.      Allergies:  Allergies  Allergen Reactions  . Mycostatin [Nystatin] Swelling and Rash    Past Medical History, Surgical history, Social history, and Family History without any changes on review.  Physical Exam:     Blood pressure Marland Kitchen)  152/72, pulse (!) 105, temperature 98.3 F (36.8 C), temperature source Temporal, resp. rate 18, height 5\' 1"  (1.549 m), weight 140 lb 4.8 oz (63.6 kg), SpO2 97 %.     ECOG: 1   General appearance: Comfortable appearing without any discomfort Head: Normocephalic without any trauma Oropharynx: Mucous membranes are moist and pink without any thrush or ulcers. Eyes: Pupils are equal and round reactive to light. Lymph nodes: No cervical, supraclavicular, inguinal or axillary lymphadenopathy.   Heart:regular rate and rhythm.  S1 and S2 without leg edema. Lung: Clear without any rhonchi or wheezes.  No dullness to  percussion. Abdomin: Soft, nontender, nondistended with good bowel sounds.  No hepatosplenomegaly. Musculoskeletal: No joint deformity or effusion.  Full range of motion noted. Neurological: No deficits noted on motor, sensory and deep tendon reflex exam. Skin: No petechial rash or dryness.  Appeared moist.         Lab Results: Lab Results  Component Value Date   WBC 7.0 01/24/2019   HGB 8.0 (L) 01/24/2019   HCT 25.6 (L) 01/24/2019   MCV 93.8 01/24/2019   PLT 298 01/24/2019     Chemistry      Component Value Date/Time   NA 138 11/30/2017 1435   NA 135 (A) 04/01/2016   K 4.4 11/30/2017 1435   CL 103 11/30/2017 1435   CO2 30 11/30/2017 1435   BUN 21 11/30/2017 1435   BUN 19 04/01/2016   CREATININE 0.88 11/30/2017 1435   GLU 102 04/01/2016      Component Value Date/Time   CALCIUM 8.6 (L) 11/30/2017 1435   ALKPHOS 74 11/30/2017 1435   AST 17 11/30/2017 1435   ALT 16 11/30/2017 1435   BILITOT <0.2 (L) 11/30/2017 1435       Impression and Plan:  86 year old woman with  1.  Multifactorial anemia related to chronically insufficiency and iron deficiency diagnosed in 2018.    She continues to receive Aranesp at 300 mcg every 3 weeks without any complications.  Risks and benefits of continuing this approach was discussed and she is agreeable to continue.  Potential complications including hypertension and injection related complications were reviewed.  Her hemoglobin is currently at 8.0 but she is symptomatic and we will proceed with 1 unit of packed red cells.  2.  Iron deficiency: Related to poor absorption.  Iron studies obtained in October 2020 continues to show deficiency.  Risks and benefits of repeat Feraheme infusion was discussed.  Potential complications including arthralgias, myalgias and infusion related issues were reviewed.  She is agreeable to continue and will receive a total of 1000 mg split doses.      3.  Follow-up: He will receive 1 unit of packed  red cells and Feraheme infusion on 01/26/2019.  She will also receive Feraheme infusion on 02/02/2019.  She will return every 3 weeks for Aranesp and in 9 weeks for MD follow-up.   25  minutes was spent with the patient face-to-face today.  More than 50% of time was spent on reviewing her disease status, discussing treatment options, complications related to therapy as well as updating her daughter via phone regarding her health condition and treatment plan.      02/04/2019, MD 11/18/20203:49 PM

## 2019-01-25 ENCOUNTER — Ambulatory Visit: Payer: Medicare Other | Admitting: Oncology

## 2019-01-25 ENCOUNTER — Other Ambulatory Visit: Payer: Medicare Other

## 2019-01-25 ENCOUNTER — Telehealth: Payer: Self-pay | Admitting: Oncology

## 2019-01-25 ENCOUNTER — Ambulatory Visit: Payer: Medicare Other

## 2019-01-25 NOTE — Telephone Encounter (Signed)
Scheduled appt per 11/18 los.  Spoke with pt daughter and she is aware of the appt dates and time.

## 2019-01-26 ENCOUNTER — Inpatient Hospital Stay: Payer: Medicare Other

## 2019-01-26 ENCOUNTER — Other Ambulatory Visit: Payer: Self-pay

## 2019-01-26 VITALS — BP 143/54 | HR 92 | Temp 98.5°F | Resp 20

## 2019-01-26 DIAGNOSIS — D631 Anemia in chronic kidney disease: Secondary | ICD-10-CM | POA: Diagnosis not present

## 2019-01-26 DIAGNOSIS — D509 Iron deficiency anemia, unspecified: Secondary | ICD-10-CM

## 2019-01-26 DIAGNOSIS — I129 Hypertensive chronic kidney disease with stage 1 through stage 4 chronic kidney disease, or unspecified chronic kidney disease: Secondary | ICD-10-CM | POA: Diagnosis not present

## 2019-01-26 DIAGNOSIS — D649 Anemia, unspecified: Secondary | ICD-10-CM

## 2019-01-26 DIAGNOSIS — Z79899 Other long term (current) drug therapy: Secondary | ICD-10-CM | POA: Diagnosis not present

## 2019-01-26 DIAGNOSIS — Z7982 Long term (current) use of aspirin: Secondary | ICD-10-CM | POA: Diagnosis not present

## 2019-01-26 DIAGNOSIS — R5383 Other fatigue: Secondary | ICD-10-CM | POA: Diagnosis not present

## 2019-01-26 DIAGNOSIS — N189 Chronic kidney disease, unspecified: Secondary | ICD-10-CM

## 2019-01-26 MED ORDER — FERUMOXYTOL INJECTION 510 MG/17 ML
510.0000 mg | Freq: Once | INTRAVENOUS | Status: AC
Start: 2019-01-26 — End: 2019-01-26
  Administered 2019-01-26: 510 mg via INTRAVENOUS
  Filled 2019-01-26: qty 510

## 2019-01-26 MED ORDER — SODIUM CHLORIDE 0.9% IV SOLUTION
250.0000 mL | Freq: Once | INTRAVENOUS | Status: AC
  Administered 2019-01-26: 250 mL via INTRAVENOUS
  Filled 2019-01-26: qty 250

## 2019-01-26 MED ORDER — SODIUM CHLORIDE 0.9 % IV SOLN
Freq: Once | INTRAVENOUS | Status: AC
  Administered 2019-01-26: 13:00:00 via INTRAVENOUS
  Filled 2019-01-26: qty 250

## 2019-01-26 NOTE — Patient Instructions (Signed)
Blood Transfusion, Adult, Care After This sheet gives you information about how to care for yourself after your procedure. Your health care provider may also give you more specific instructions. If you have problems or questions, contact your health care provider. What can I expect after the procedure? After your procedure, it is common to have:  Bruising and soreness where the IV tube was inserted.  Headache. Follow these instructions at home:   Take over-the-counter and prescription medicines only as told by your health care provider.  Return to your normal activities as told by your health care provider.  Follow instructions from your health care provider about how to take care of your IV insertion site. Make sure you: ? Wash your hands with soap and water before you change your bandage (dressing). If soap and water are not available, use hand sanitizer. ? Change your dressing as told by your health care provider.  Check your IV insertion site every day for signs of infection. Check for: ? More redness, swelling, or pain. ? More fluid or blood. ? Warmth. ? Pus or a bad smell. Contact a health care provider if:  You have more redness, swelling, or pain around the IV insertion site.  You have more fluid or blood coming from the IV insertion site.  Your IV insertion site feels warm to the touch.  You have pus or a bad smell coming from the IV insertion site.  Your urine turns pink, red, or brown.  You feel weak after doing your normal activities. Get help right away if:  You have signs of a serious allergic or immune system reaction, including: ? Itchiness. ? Hives. ? Trouble breathing. ? Anxiety. ? Chest or lower back pain. ? Fever, flushing, and chills. ? Rapid pulse. ? Rash. ? Diarrhea. ? Vomiting. ? Dark urine. ? Serious headache. ? Dizziness. ? Stiff neck. ? Yellow coloration of the face or the white parts of the eyes (jaundice). This information is not  intended to replace advice given to you by your health care provider. Make sure you discuss any questions you have with your health care provider. Document Released: 03/15/2014 Document Revised: 12/20/2016 Document Reviewed: 09/08/2015 Elsevier Patient Education  2020 Elsevier Inc.  

## 2019-01-26 NOTE — Progress Notes (Signed)
Pt takes home supply of Tylenol (500mg ) and Benadryl (25mg ) prior to blood transfusion. Pt took these medications at 1315.

## 2019-01-27 LAB — TYPE AND SCREEN
ABO/RH(D): A POS
Antibody Screen: NEGATIVE
Unit division: 0

## 2019-01-27 LAB — BPAM RBC
Blood Product Expiration Date: 202012152359
ISSUE DATE / TIME: 202011201510
Unit Type and Rh: 6200

## 2019-01-27 LAB — PREPARE RBC (CROSSMATCH)

## 2019-02-05 ENCOUNTER — Other Ambulatory Visit: Payer: Self-pay

## 2019-02-05 ENCOUNTER — Inpatient Hospital Stay: Payer: Medicare Other

## 2019-02-05 VITALS — BP 109/42 | HR 93 | Temp 98.6°F | Resp 20

## 2019-02-05 DIAGNOSIS — D631 Anemia in chronic kidney disease: Secondary | ICD-10-CM

## 2019-02-05 DIAGNOSIS — Z79899 Other long term (current) drug therapy: Secondary | ICD-10-CM | POA: Diagnosis not present

## 2019-02-05 DIAGNOSIS — R5383 Other fatigue: Secondary | ICD-10-CM | POA: Diagnosis not present

## 2019-02-05 DIAGNOSIS — I129 Hypertensive chronic kidney disease with stage 1 through stage 4 chronic kidney disease, or unspecified chronic kidney disease: Secondary | ICD-10-CM | POA: Diagnosis not present

## 2019-02-05 DIAGNOSIS — N189 Chronic kidney disease, unspecified: Secondary | ICD-10-CM | POA: Diagnosis not present

## 2019-02-05 DIAGNOSIS — D509 Iron deficiency anemia, unspecified: Secondary | ICD-10-CM | POA: Diagnosis not present

## 2019-02-05 DIAGNOSIS — Z7982 Long term (current) use of aspirin: Secondary | ICD-10-CM | POA: Diagnosis not present

## 2019-02-05 DIAGNOSIS — D649 Anemia, unspecified: Secondary | ICD-10-CM

## 2019-02-05 MED ORDER — SODIUM CHLORIDE 0.9 % IV SOLN
Freq: Once | INTRAVENOUS | Status: AC
  Administered 2019-02-05: 15:00:00 via INTRAVENOUS
  Filled 2019-02-05: qty 250

## 2019-02-05 MED ORDER — SODIUM CHLORIDE 0.9 % IV SOLN
510.0000 mg | Freq: Once | INTRAVENOUS | Status: AC
  Administered 2019-02-05: 510 mg via INTRAVENOUS
  Filled 2019-02-05: qty 17

## 2019-02-05 NOTE — Patient Instructions (Signed)
Ferumoxytol injection What is this medicine? FERUMOXYTOL is an iron complex. Iron is used to make healthy red blood cells, which carry oxygen and nutrients throughout the body. This medicine is used to treat iron deficiency anemia. This medicine may be used for other purposes; ask your health care provider or pharmacist if you have questions. COMMON BRAND NAME(S): Feraheme What should I tell my health care provider before I take this medicine? They need to know if you have any of these conditions:  anemia not caused by low iron levels  high levels of iron in the blood  magnetic resonance imaging (MRI) test scheduled  an unusual or allergic reaction to iron, other medicines, foods, dyes, or preservatives  pregnant or trying to get pregnant  breast-feeding How should I use this medicine? This medicine is for injection into a vein. It is given by a health care professional in a hospital or clinic setting. Talk to your pediatrician regarding the use of this medicine in children. Special care may be needed. Overdosage: If you think you have taken too much of this medicine contact a poison control center or emergency room at once. NOTE: This medicine is only for you. Do not share this medicine with others. What if I miss a dose? It is important not to miss your dose. Call your doctor or health care professional if you are unable to keep an appointment. What may interact with this medicine? This medicine may interact with the following medications:  other iron products This list may not describe all possible interactions. Give your health care provider a list of all the medicines, herbs, non-prescription drugs, or dietary supplements you use. Also tell them if you smoke, drink alcohol, or use illegal drugs. Some items may interact with your medicine. What should I watch for while using this medicine? Visit your doctor or healthcare professional regularly. Tell your doctor or healthcare  professional if your symptoms do not start to get better or if they get worse. You may need blood work done while you are taking this medicine. You may need to follow a special diet. Talk to your doctor. Foods that contain iron include: whole grains/cereals, dried fruits, beans, or peas, leafy green vegetables, and organ meats (liver, kidney). What side effects may I notice from receiving this medicine? Side effects that you should report to your doctor or health care professional as soon as possible:  allergic reactions like skin rash, itching or hives, swelling of the face, lips, or tongue  breathing problems  changes in blood pressure  feeling faint or lightheaded, falls  fever or chills  flushing, sweating, or hot feelings  swelling of the ankles or feet Side effects that usually do not require medical attention (report to your doctor or health care professional if they continue or are bothersome):  diarrhea  headache  nausea, vomiting  stomach pain This list may not describe all possible side effects. Call your doctor for medical advice about side effects. You may report side effects to FDA at 1-800-FDA-1088. Where should I keep my medicine? This drug is given in a hospital or clinic and will not be stored at home. NOTE: This sheet is a summary. It may not cover all possible information. If you have questions about this medicine, talk to your doctor, pharmacist, or health care provider.  2020 Elsevier/Gold Standard (2016-04-12 20:21:10)    Blood Transfusion, Adult, Care After This sheet gives you information about how to care for yourself after your procedure. Your doctor   may also give you more specific instructions. If you have problems or questions, contact your doctor. Follow these instructions at home:   Take over-the-counter and prescription medicines only as told by your doctor.  Go back to your normal activities as told by your doctor.  Follow instructions  from your doctor about how to take care of the area where an IV tube was put into your vein (insertion site). Make sure you: ? Wash your hands with soap and water before you change your bandage (dressing). If there is no soap and water, use hand sanitizer. ? Change your bandage as told by your doctor.  Check your IV insertion site every day for signs of infection. Check for: ? More redness, swelling, or pain. ? More fluid or blood. ? Warmth. ? Pus or a bad smell. Contact a doctor if:  You have more redness, swelling, or pain around the IV insertion site.  You have more fluid or blood coming from the IV insertion site.  Your IV insertion site feels warm to the touch.  You have pus or a bad smell coming from the IV insertion site.  Your pee (urine) turns pink, red, or brown.  You feel weak after doing your normal activities. Get help right away if:  You have signs of a serious allergic or body defense (immune) system reaction, including: ? Itchiness. ? Hives. ? Trouble breathing. ? Anxiety. ? Pain in your chest or lower back. ? Fever, flushing, and chills. ? Fast pulse. ? Rash. ? Watery poop (diarrhea). ? Throwing up (vomiting). ? Dark pee. ? Serious headache. ? Dizziness. ? Stiff neck. ? Yellow color in your face or the white parts of your eyes (jaundice). Summary  After a blood transfusion, return to your normal activities as told by your doctor.  Every day, check for signs of infection where the IV tube was put into your vein.  Some signs of infection are warm skin, more redness and pain, more fluid or blood, and pus or a bad smell where the needle went in.  Contact your doctor if you feel weak or have any unusual symptoms. This information is not intended to replace advice given to you by your health care provider. Make sure you discuss any questions you have with your health care provider. Document Released: 03/15/2014 Document Revised: 06/29/2017 Document  Reviewed: 10/17/2015 Elsevier Patient Education  2020 Elsevier Inc.  

## 2019-02-06 DIAGNOSIS — B351 Tinea unguium: Secondary | ICD-10-CM | POA: Diagnosis not present

## 2019-02-06 DIAGNOSIS — M79671 Pain in right foot: Secondary | ICD-10-CM | POA: Diagnosis not present

## 2019-02-06 DIAGNOSIS — L84 Corns and callosities: Secondary | ICD-10-CM | POA: Diagnosis not present

## 2019-02-06 DIAGNOSIS — M79672 Pain in left foot: Secondary | ICD-10-CM | POA: Diagnosis not present

## 2019-02-14 ENCOUNTER — Inpatient Hospital Stay: Payer: Medicare Other

## 2019-02-14 ENCOUNTER — Other Ambulatory Visit: Payer: Self-pay

## 2019-02-14 ENCOUNTER — Other Ambulatory Visit: Payer: Medicare Other

## 2019-02-14 ENCOUNTER — Inpatient Hospital Stay: Payer: Medicare Other | Attending: Oncology

## 2019-02-14 ENCOUNTER — Ambulatory Visit: Payer: Medicare Other

## 2019-02-14 VITALS — BP 118/57 | HR 96 | Temp 98.3°F | Resp 20

## 2019-02-14 DIAGNOSIS — N189 Chronic kidney disease, unspecified: Secondary | ICD-10-CM | POA: Diagnosis not present

## 2019-02-14 DIAGNOSIS — Z79899 Other long term (current) drug therapy: Secondary | ICD-10-CM | POA: Insufficient documentation

## 2019-02-14 DIAGNOSIS — I129 Hypertensive chronic kidney disease with stage 1 through stage 4 chronic kidney disease, or unspecified chronic kidney disease: Secondary | ICD-10-CM | POA: Insufficient documentation

## 2019-02-14 DIAGNOSIS — D631 Anemia in chronic kidney disease: Secondary | ICD-10-CM | POA: Diagnosis not present

## 2019-02-14 DIAGNOSIS — D649 Anemia, unspecified: Secondary | ICD-10-CM

## 2019-02-14 DIAGNOSIS — D509 Iron deficiency anemia, unspecified: Secondary | ICD-10-CM

## 2019-02-14 LAB — CBC WITH DIFFERENTIAL (CANCER CENTER ONLY)
Abs Immature Granulocytes: 0.03 10*3/uL (ref 0.00–0.07)
Basophils Absolute: 0 10*3/uL (ref 0.0–0.1)
Basophils Relative: 0 %
Eosinophils Absolute: 0.1 10*3/uL (ref 0.0–0.5)
Eosinophils Relative: 1 %
HCT: 30.6 % — ABNORMAL LOW (ref 36.0–46.0)
Hemoglobin: 9.5 g/dL — ABNORMAL LOW (ref 12.0–15.0)
Immature Granulocytes: 0 %
Lymphocytes Relative: 5 %
Lymphs Abs: 0.5 10*3/uL — ABNORMAL LOW (ref 0.7–4.0)
MCH: 30.2 pg (ref 26.0–34.0)
MCHC: 31 g/dL (ref 30.0–36.0)
MCV: 97.1 fL (ref 80.0–100.0)
Monocytes Absolute: 0.9 10*3/uL (ref 0.1–1.0)
Monocytes Relative: 10 %
Neutro Abs: 8.1 10*3/uL — ABNORMAL HIGH (ref 1.7–7.7)
Neutrophils Relative %: 84 %
Platelet Count: 258 10*3/uL (ref 150–400)
RBC: 3.15 MIL/uL — ABNORMAL LOW (ref 3.87–5.11)
RDW: 19.3 % — ABNORMAL HIGH (ref 11.5–15.5)
WBC Count: 9.7 10*3/uL (ref 4.0–10.5)
nRBC: 0 % (ref 0.0–0.2)

## 2019-02-14 LAB — SAMPLE TO BLOOD BANK

## 2019-02-14 MED ORDER — DARBEPOETIN ALFA 300 MCG/0.6ML IJ SOSY
PREFILLED_SYRINGE | INTRAMUSCULAR | Status: AC
  Filled 2019-02-14: qty 0.6

## 2019-02-14 MED ORDER — DARBEPOETIN ALFA 300 MCG/0.6ML IJ SOSY
300.0000 ug | PREFILLED_SYRINGE | Freq: Once | INTRAMUSCULAR | Status: AC
  Administered 2019-02-14: 300 ug via SUBCUTANEOUS

## 2019-02-14 NOTE — Patient Instructions (Signed)
Darbepoetin Alfa injection What is this medicine? DARBEPOETIN ALFA (dar be POE e tin AL fa) helps your body make more red blood cells. It is used to treat anemia caused by chronic kidney failure and chemotherapy. This medicine may be used for other purposes; ask your health care provider or pharmacist if you have questions. COMMON BRAND NAME(S): Aranesp What should I tell my health care provider before I take this medicine? They need to know if you have any of these conditions:  blood clotting disorders or history of blood clots  cancer patient not on chemotherapy  cystic fibrosis  heart disease, such as angina, heart failure, or a history of a heart attack  hemoglobin level of 12 g/dL or greater  high blood pressure  low levels of folate, iron, or vitamin B12  seizures  an unusual or allergic reaction to darbepoetin, erythropoietin, albumin, hamster proteins, latex, other medicines, foods, dyes, or preservatives  pregnant or trying to get pregnant  breast-feeding How should I use this medicine? This medicine is for injection into a vein or under the skin. It is usually given by a health care professional in a hospital or clinic setting. If you get this medicine at home, you will be taught how to prepare and give this medicine. Use exactly as directed. Take your medicine at regular intervals. Do not take your medicine more often than directed. It is important that you put your used needles and syringes in a special sharps container. Do not put them in a trash can. If you do not have a sharps container, call your pharmacist or healthcare provider to get one. A special MedGuide will be given to you by the pharmacist with each prescription and refill. Be sure to read this information carefully each time. Talk to your pediatrician regarding the use of this medicine in children. While this medicine may be used in children as young as 1 month of age for selected conditions, precautions do  apply. Overdosage: If you think you have taken too much of this medicine contact a poison control center or emergency room at once. NOTE: This medicine is only for you. Do not share this medicine with others. What if I miss a dose? If you miss a dose, take it as soon as you can. If it is almost time for your next dose, take only that dose. Do not take double or extra doses. What may interact with this medicine? Do not take this medicine with any of the following medications:  epoetin alfa This list may not describe all possible interactions. Give your health care provider a list of all the medicines, herbs, non-prescription drugs, or dietary supplements you use. Also tell them if you smoke, drink alcohol, or use illegal drugs. Some items may interact with your medicine. What should I watch for while using this medicine? Your condition will be monitored carefully while you are receiving this medicine. You may need blood work done while you are taking this medicine. This medicine may cause a decrease in vitamin B6. You should make sure that you get enough vitamin B6 while you are taking this medicine. Discuss the foods you eat and the vitamins you take with your health care professional. What side effects may I notice from receiving this medicine? Side effects that you should report to your doctor or health care professional as soon as possible:  allergic reactions like skin rash, itching or hives, swelling of the face, lips, or tongue  breathing problems  changes in   vision  chest pain  confusion, trouble speaking or understanding  feeling faint or lightheaded, falls  high blood pressure  muscle aches or pains  pain, swelling, warmth in the leg  rapid weight gain  severe headaches  sudden numbness or weakness of the face, arm or leg  trouble walking, dizziness, loss of balance or coordination  seizures (convulsions)  swelling of the ankles, feet, hands  unusually weak or  tired Side effects that usually do not require medical attention (report to your doctor or health care professional if they continue or are bothersome):  diarrhea  fever, chills (flu-like symptoms)  headaches  nausea, vomiting  redness, stinging, or swelling at site where injected This list may not describe all possible side effects. Call your doctor for medical advice about side effects. You may report side effects to FDA at 1-800-FDA-1088. Where should I keep my medicine? Keep out of the reach of children. Store in a refrigerator between 2 and 8 degrees C (36 and 46 degrees F). Do not freeze. Do not shake. Throw away any unused portion if using a single-dose vial. Throw away any unused medicine after the expiration date. NOTE: This sheet is a summary. It may not cover all possible information. If you have questions about this medicine, talk to your doctor, pharmacist, or health care provider.  2020 Elsevier/Gold Standard (2017-03-09 16:44:20)  

## 2019-03-07 ENCOUNTER — Ambulatory Visit: Payer: Medicare Other

## 2019-03-07 ENCOUNTER — Other Ambulatory Visit: Payer: Medicare Other

## 2019-03-07 ENCOUNTER — Inpatient Hospital Stay: Payer: Medicare Other

## 2019-03-07 ENCOUNTER — Telehealth: Payer: Self-pay

## 2019-03-07 ENCOUNTER — Other Ambulatory Visit: Payer: Self-pay

## 2019-03-07 VITALS — BP 141/57 | HR 100 | Temp 98.5°F | Resp 18

## 2019-03-07 DIAGNOSIS — D649 Anemia, unspecified: Secondary | ICD-10-CM

## 2019-03-07 DIAGNOSIS — D631 Anemia in chronic kidney disease: Secondary | ICD-10-CM

## 2019-03-07 DIAGNOSIS — Z79899 Other long term (current) drug therapy: Secondary | ICD-10-CM | POA: Diagnosis not present

## 2019-03-07 DIAGNOSIS — N189 Chronic kidney disease, unspecified: Secondary | ICD-10-CM

## 2019-03-07 DIAGNOSIS — I129 Hypertensive chronic kidney disease with stage 1 through stage 4 chronic kidney disease, or unspecified chronic kidney disease: Secondary | ICD-10-CM | POA: Diagnosis not present

## 2019-03-07 DIAGNOSIS — D509 Iron deficiency anemia, unspecified: Secondary | ICD-10-CM

## 2019-03-07 LAB — CBC WITH DIFFERENTIAL (CANCER CENTER ONLY)
Abs Immature Granulocytes: 0.01 10*3/uL (ref 0.00–0.07)
Basophils Absolute: 0 10*3/uL (ref 0.0–0.1)
Basophils Relative: 1 %
Eosinophils Absolute: 0.1 10*3/uL (ref 0.0–0.5)
Eosinophils Relative: 2 %
HCT: 29.5 % — ABNORMAL LOW (ref 36.0–46.0)
Hemoglobin: 9.3 g/dL — ABNORMAL LOW (ref 12.0–15.0)
Immature Granulocytes: 0 %
Lymphocytes Relative: 7 %
Lymphs Abs: 0.5 10*3/uL — ABNORMAL LOW (ref 0.7–4.0)
MCH: 31.2 pg (ref 26.0–34.0)
MCHC: 31.5 g/dL (ref 30.0–36.0)
MCV: 99 fL (ref 80.0–100.0)
Monocytes Absolute: 0.7 10*3/uL (ref 0.1–1.0)
Monocytes Relative: 10 %
Neutro Abs: 5.6 10*3/uL (ref 1.7–7.7)
Neutrophils Relative %: 80 %
Platelet Count: 240 10*3/uL (ref 150–400)
RBC: 2.98 MIL/uL — ABNORMAL LOW (ref 3.87–5.11)
RDW: 18 % — ABNORMAL HIGH (ref 11.5–15.5)
WBC Count: 6.9 10*3/uL (ref 4.0–10.5)
nRBC: 0 % (ref 0.0–0.2)

## 2019-03-07 LAB — FERRITIN: Ferritin: 42 ng/mL (ref 11–307)

## 2019-03-07 LAB — IRON AND TIBC
Iron: 31 ug/dL — ABNORMAL LOW (ref 41–142)
Saturation Ratios: 14 % — ABNORMAL LOW (ref 21–57)
TIBC: 214 ug/dL — ABNORMAL LOW (ref 236–444)
UIBC: 183 ug/dL (ref 120–384)

## 2019-03-07 LAB — SAMPLE TO BLOOD BANK

## 2019-03-07 MED ORDER — DARBEPOETIN ALFA 300 MCG/0.6ML IJ SOSY
PREFILLED_SYRINGE | INTRAMUSCULAR | Status: AC
  Filled 2019-03-07: qty 0.6

## 2019-03-07 MED ORDER — DARBEPOETIN ALFA 300 MCG/0.6ML IJ SOSY
300.0000 ug | PREFILLED_SYRINGE | Freq: Once | INTRAMUSCULAR | Status: AC
  Administered 2019-03-07: 300 ug via SUBCUTANEOUS

## 2019-03-07 NOTE — Telephone Encounter (Signed)
Patient's daughter Pamala Hurry called and stated patient has the opportunity to receive the COVID vaccine on Monday. Pamala Hurry wants to know if Dr. Alen Blew recommends patient receiving the vaccine. Per Dr. Alen Blew he has no objections to patient receiving the vaccine from his standpoint. Pamala Hurry made aware and verbalized understanding.

## 2019-03-27 ENCOUNTER — Telehealth: Payer: Self-pay

## 2019-03-27 NOTE — Telephone Encounter (Signed)
Called and spoke to patient's daughter Lisa [name] about information below. Daughter verbalized understanding and will relay information to patient.

## 2019-03-27 NOTE — Telephone Encounter (Signed)
-----   Message from Benjiman Core, MD sent at 03/27/2019 10:44 AM EST ----- Regarding: RE: Patient question It does not appear to be gout at this time. If she has concerns regarding swelling or pain she needs to talk to her primary care physician. There is really no specific lab that can be done for gout. Thanks ----- Message ----- From: Nonah Mattes, RN Sent: 03/27/2019  10:27 AM EST To: Benjiman Core, MD Subject: Patient question                               Patient's daughter called office stating patient has had "soreness around her big toe" for the past couple days.   Patient has a history of ingrown toenails and sees a podiatrist for this issue. Patient's daughter also stated that when she visited the patient yesterday, she noticed patient had swelling in BLE. Patient's daughter had patient elevate her feet and swelling resolved after a couple hours. Patient's daughter wants to know if "this is being caused by gout and if Dr. Clelia Croft can run a lab test to check".  Patient is scheduled for office visit on 03/29/19.   Please advise.

## 2019-03-29 ENCOUNTER — Inpatient Hospital Stay: Payer: Medicare Other | Attending: Oncology

## 2019-03-29 ENCOUNTER — Inpatient Hospital Stay: Payer: Medicare Other

## 2019-03-29 ENCOUNTER — Inpatient Hospital Stay (HOSPITAL_BASED_OUTPATIENT_CLINIC_OR_DEPARTMENT_OTHER): Payer: Medicare Other | Admitting: Oncology

## 2019-03-29 ENCOUNTER — Other Ambulatory Visit: Payer: Self-pay

## 2019-03-29 VITALS — BP 147/64 | HR 108 | Temp 98.9°F | Resp 18 | Ht 61.0 in | Wt 141.7 lb

## 2019-03-29 DIAGNOSIS — Z7982 Long term (current) use of aspirin: Secondary | ICD-10-CM | POA: Diagnosis not present

## 2019-03-29 DIAGNOSIS — D649 Anemia, unspecified: Secondary | ICD-10-CM

## 2019-03-29 DIAGNOSIS — M79676 Pain in unspecified toe(s): Secondary | ICD-10-CM | POA: Diagnosis not present

## 2019-03-29 DIAGNOSIS — D631 Anemia in chronic kidney disease: Secondary | ICD-10-CM

## 2019-03-29 DIAGNOSIS — I129 Hypertensive chronic kidney disease with stage 1 through stage 4 chronic kidney disease, or unspecified chronic kidney disease: Secondary | ICD-10-CM | POA: Diagnosis not present

## 2019-03-29 DIAGNOSIS — Z79899 Other long term (current) drug therapy: Secondary | ICD-10-CM | POA: Diagnosis not present

## 2019-03-29 DIAGNOSIS — D509 Iron deficiency anemia, unspecified: Secondary | ICD-10-CM

## 2019-03-29 DIAGNOSIS — N189 Chronic kidney disease, unspecified: Secondary | ICD-10-CM

## 2019-03-29 LAB — CBC WITH DIFFERENTIAL (CANCER CENTER ONLY)
Abs Immature Granulocytes: 0.02 10*3/uL (ref 0.00–0.07)
Basophils Absolute: 0 10*3/uL (ref 0.0–0.1)
Basophils Relative: 1 %
Eosinophils Absolute: 0.1 10*3/uL (ref 0.0–0.5)
Eosinophils Relative: 1 %
HCT: 28.1 % — ABNORMAL LOW (ref 36.0–46.0)
Hemoglobin: 8.7 g/dL — ABNORMAL LOW (ref 12.0–15.0)
Immature Granulocytes: 0 %
Lymphocytes Relative: 6 %
Lymphs Abs: 0.5 10*3/uL — ABNORMAL LOW (ref 0.7–4.0)
MCH: 29.2 pg (ref 26.0–34.0)
MCHC: 31 g/dL (ref 30.0–36.0)
MCV: 94.3 fL (ref 80.0–100.0)
Monocytes Absolute: 0.9 10*3/uL (ref 0.1–1.0)
Monocytes Relative: 12 %
Neutro Abs: 6.2 10*3/uL (ref 1.7–7.7)
Neutrophils Relative %: 80 %
Platelet Count: 321 10*3/uL (ref 150–400)
RBC: 2.98 MIL/uL — ABNORMAL LOW (ref 3.87–5.11)
RDW: 16.6 % — ABNORMAL HIGH (ref 11.5–15.5)
WBC Count: 7.8 10*3/uL (ref 4.0–10.5)
nRBC: 0 % (ref 0.0–0.2)

## 2019-03-29 LAB — SAMPLE TO BLOOD BANK

## 2019-03-29 MED ORDER — DARBEPOETIN ALFA 300 MCG/0.6ML IJ SOSY
300.0000 ug | PREFILLED_SYRINGE | Freq: Once | INTRAMUSCULAR | Status: AC
  Administered 2019-03-29: 300 ug via SUBCUTANEOUS

## 2019-03-29 MED ORDER — DARBEPOETIN ALFA 300 MCG/0.6ML IJ SOSY
PREFILLED_SYRINGE | INTRAMUSCULAR | Status: AC
  Filled 2019-03-29: qty 0.6

## 2019-03-29 NOTE — Patient Instructions (Signed)
Darbepoetin Alfa injection What is this medicine? DARBEPOETIN ALFA (dar be POE e tin AL fa) helps your body make more red blood cells. It is used to treat anemia caused by chronic kidney failure and chemotherapy. This medicine may be used for other purposes; ask your health care provider or pharmacist if you have questions. COMMON BRAND NAME(S): Aranesp What should I tell my health care provider before I take this medicine? They need to know if you have any of these conditions:  blood clotting disorders or history of blood clots  cancer patient not on chemotherapy  cystic fibrosis  heart disease, such as angina, heart failure, or a history of a heart attack  hemoglobin level of 12 g/dL or greater  high blood pressure  low levels of folate, iron, or vitamin B12  seizures  an unusual or allergic reaction to darbepoetin, erythropoietin, albumin, hamster proteins, latex, other medicines, foods, dyes, or preservatives  pregnant or trying to get pregnant  breast-feeding How should I use this medicine? This medicine is for injection into a vein or under the skin. It is usually given by a health care professional in a hospital or clinic setting. If you get this medicine at home, you will be taught how to prepare and give this medicine. Use exactly as directed. Take your medicine at regular intervals. Do not take your medicine more often than directed. It is important that you put your used needles and syringes in a special sharps container. Do not put them in a trash can. If you do not have a sharps container, call your pharmacist or healthcare provider to get one. A special MedGuide will be given to you by the pharmacist with each prescription and refill. Be sure to read this information carefully each time. Talk to your pediatrician regarding the use of this medicine in children. While this medicine may be used in children as young as 1 month of age for selected conditions, precautions do  apply. Overdosage: If you think you have taken too much of this medicine contact a poison control center or emergency room at once. NOTE: This medicine is only for you. Do not share this medicine with others. What if I miss a dose? If you miss a dose, take it as soon as you can. If it is almost time for your next dose, take only that dose. Do not take double or extra doses. What may interact with this medicine? Do not take this medicine with any of the following medications:  epoetin alfa This list may not describe all possible interactions. Give your health care provider a list of all the medicines, herbs, non-prescription drugs, or dietary supplements you use. Also tell them if you smoke, drink alcohol, or use illegal drugs. Some items may interact with your medicine. What should I watch for while using this medicine? Your condition will be monitored carefully while you are receiving this medicine. You may need blood work done while you are taking this medicine. This medicine may cause a decrease in vitamin B6. You should make sure that you get enough vitamin B6 while you are taking this medicine. Discuss the foods you eat and the vitamins you take with your health care professional. What side effects may I notice from receiving this medicine? Side effects that you should report to your doctor or health care professional as soon as possible:  allergic reactions like skin rash, itching or hives, swelling of the face, lips, or tongue  breathing problems  changes in   vision  chest pain  confusion, trouble speaking or understanding  feeling faint or lightheaded, falls  high blood pressure  muscle aches or pains  pain, swelling, warmth in the leg  rapid weight gain  severe headaches  sudden numbness or weakness of the face, arm or leg  trouble walking, dizziness, loss of balance or coordination  seizures (convulsions)  swelling of the ankles, feet, hands  unusually weak or  tired Side effects that usually do not require medical attention (report to your doctor or health care professional if they continue or are bothersome):  diarrhea  fever, chills (flu-like symptoms)  headaches  nausea, vomiting  redness, stinging, or swelling at site where injected This list may not describe all possible side effects. Call your doctor for medical advice about side effects. You may report side effects to FDA at 1-800-FDA-1088. Where should I keep my medicine? Keep out of the reach of children. Store in a refrigerator between 2 and 8 degrees C (36 and 46 degrees F). Do not freeze. Do not shake. Throw away any unused portion if using a single-dose vial. Throw away any unused medicine after the expiration date. NOTE: This sheet is a summary. It may not cover all possible information. If you have questions about this medicine, talk to your doctor, pharmacist, or health care provider.  2020 Elsevier/Gold Standard (2017-03-09 16:44:20)  

## 2019-03-29 NOTE — Addendum Note (Signed)
Addended by: Virgie Dad on: 03/29/2019 02:04 PM   Modules accepted: Orders

## 2019-03-29 NOTE — Progress Notes (Signed)
Hematology and Oncology Follow Up Visit  Lisa [name] [id number] [birth year] 86 y.o. 03/29/2019 1:20 PM Lisa [name], MDBarnes, Lisa Manis, MD   Principle Diagnosis: 86 year old woman with multifactorial anemia related to chronic renal sufficiency, iron deficiency and chronic disease since 2018.        Prior Therapy: IV iron infusion infusion as well as packed red cell transfusion.  Current therapy:   Aranesp 300 mcg every 3 weeks   More Feraheme at 1000 mg every 6 to 9 weeks.  Supportive packed red cell transfusion for hemoglobin below 8.    Interim History: Lisa [name] returns for a follow-up visit.  Since the last visit, she reports feeling reasonably well without any complaints.  She does report toe pain but no issues with fatigue, tiredness or confusion.  She denies any chest pain shortness of breath.  She denies any difficulty breathing.  Performance status and activity level remained stable without any decline.                 Medications: Updated on review today. Current Outpatient Medications  Medication Sig Dispense Refill  . acetaminophen (TYLENOL) 500 MG tablet Take 500-1,000 mg by mouth at bedtime.    Marland Kitchen aspirin EC 81 MG tablet Take 81 mg by mouth daily.    . calcium carbonate (OS-CAL) 600 MG TABS Take 600 mg by mouth daily.     . ferrous sulfate 325 (65 FE) MG tablet Take 325 mg by mouth daily with breakfast.    . loratadine (CLARITIN) 10 MG tablet Take 10 mg by mouth daily as needed.     . Multiple Vitamins-Minerals (MULTIVITAMIN WITH MINERALS) tablet Take 1 tablet by mouth daily.    Marland Kitchen OVER THE COUNTER MEDICATION Apply topically as needed. Cream for arthritic pain. Patient unable to recall the name at this time.    Marland Kitchen oxybutynin (DITROPAN) 5 MG tablet Take 2.5 mg by mouth 2 (two) times daily.  2  . pramipexole (MIRAPEX) 0.25 MG tablet Take 0.25 mg by mouth at bedtime.    . primidone (MYSOLINE) 50 MG tablet Take 3 tablets (150 mg total) by mouth at  bedtime. 270 tablet 3  . raloxifene (EVISTA) 60 MG tablet Take 60 mg by mouth at bedtime.     . timolol (TIMOPTIC) 0.5 % ophthalmic solution Place 1 drop into both eyes daily.   1  . UNABLE TO FIND Med Name: Aranesp infusions every 3 weeks administered by [facility]    . VYTORIN 10-20 MG tablet Take 1 tablet by mouth daily.  0   No current facility-administered medications for this visit.     Allergies:  Allergies  Allergen Reactions  . Mycostatin [Nystatin] Swelling and Rash      Physical Exam:         ECOG: 1     General appearance: Alert, awake without any distress. Head: Atraumatic without abnormalities Oropharynx: Without any thrush or ulcers. Eyes: No scleral icterus. Lymph nodes: No lymphadenopathy noted in the cervical, supraclavicular, or axillary nodes Heart:regular rate and rhythm, without any murmurs or gallops.   Lung: Clear to auscultation without any rhonchi, wheezes or dullness to percussion. Abdomin: Soft, nontender without any shifting dullness or ascites. Musculoskeletal: No clubbing or cyanosis. Neurological: No motor or sensory deficits. Skin: No rashes or lesions.          Lab Results: Lab Results  Component Value Date   WBC 6.9 03/07/2019   HGB 9.3 (L) 03/07/2019   HCT 29.5 (  L) 03/07/2019   MCV 99.0 03/07/2019   PLT 240 03/07/2019     Chemistry      Component Value Date/Time   NA 138 11/30/2017 1435   NA 135 (A) 04/01/2016 0000   K 4.4 11/30/2017 1435   CL 103 11/30/2017 1435   CO2 30 11/30/2017 1435   BUN 21 11/30/2017 1435   BUN 19 04/01/2016 0000   CREATININE 0.88 11/30/2017 1435   GLU 102 04/01/2016 0000      Component Value Date/Time   CALCIUM 8.6 (L) 11/30/2017 1435   ALKPHOS 74 11/30/2017 1435   AST 17 11/30/2017 1435   ALT 16 11/30/2017 1435   BILITOT <0.2 (L) 11/30/2017 1435       Impression and Plan:  86 year old woman with:   1.  Anemia with multifactorial etiology including iron  deficiency, renal insufficiency as well as chronic disease.  She is currently on supportive management including Aranesp 300 mcg every 3 weeks as well as packed red cell transfusion as needed.  Hemoglobin today is 8.7 with iron studies obtained on March 07, 2019 showed iron level of 31 with ferritin of 42.  Risks about repeat of packed red cell transfusion was reviewed today.  The plan is to proceed with Aranesp today.  No need for packed red cell transfusion.  2.  Iron deficiency: She is status post a Feraheme infusion given on November 30.  Risks and benefits of repeat IV iron was discussed today.  Potential complications including infusion related complications, arthralgias and myalgias among others were reviewed.  I recommended proceeding with intravenous iron for a total of 1000 mg in 2 doses starting on March 30, 2019.      3.  Follow-up: She will return every 3 weeks for Aranesp and MD follow-up in 9 weeks.   30 minutes was spent on this encounter.  The time was dedicated to reviewing laboratory data, treatment options, risk complication related to therapy and future plan of care.     Lisa Button, MD 1/21/20211:20 PM

## 2019-03-30 ENCOUNTER — Telehealth: Payer: Self-pay | Admitting: Oncology

## 2019-03-30 ENCOUNTER — Inpatient Hospital Stay: Payer: Medicare Other

## 2019-03-30 ENCOUNTER — Other Ambulatory Visit: Payer: Self-pay

## 2019-03-30 VITALS — BP 137/56 | HR 101 | Temp 99.0°F | Resp 18

## 2019-03-30 DIAGNOSIS — D631 Anemia in chronic kidney disease: Secondary | ICD-10-CM

## 2019-03-30 DIAGNOSIS — Z7982 Long term (current) use of aspirin: Secondary | ICD-10-CM | POA: Diagnosis not present

## 2019-03-30 DIAGNOSIS — N189 Chronic kidney disease, unspecified: Secondary | ICD-10-CM | POA: Diagnosis not present

## 2019-03-30 DIAGNOSIS — D509 Iron deficiency anemia, unspecified: Secondary | ICD-10-CM | POA: Diagnosis not present

## 2019-03-30 DIAGNOSIS — D649 Anemia, unspecified: Secondary | ICD-10-CM

## 2019-03-30 DIAGNOSIS — M79676 Pain in unspecified toe(s): Secondary | ICD-10-CM | POA: Diagnosis not present

## 2019-03-30 DIAGNOSIS — Z79899 Other long term (current) drug therapy: Secondary | ICD-10-CM | POA: Diagnosis not present

## 2019-03-30 DIAGNOSIS — I129 Hypertensive chronic kidney disease with stage 1 through stage 4 chronic kidney disease, or unspecified chronic kidney disease: Secondary | ICD-10-CM | POA: Diagnosis not present

## 2019-03-30 MED ORDER — SODIUM CHLORIDE 0.9 % IV SOLN
Freq: Once | INTRAVENOUS | Status: AC
  Filled 2019-03-30: qty 250

## 2019-03-30 MED ORDER — SODIUM CHLORIDE 0.9 % IV SOLN
510.0000 mg | Freq: Once | INTRAVENOUS | Status: AC
  Administered 2019-03-30: 510 mg via INTRAVENOUS
  Filled 2019-03-30: qty 510

## 2019-03-30 NOTE — Telephone Encounter (Signed)
Scheduled appt per 1/21 los.  Patient will get an updated appt calendar at her infusion appt today (1/22)

## 2019-03-30 NOTE — Patient Instructions (Signed)
Ferumoxytol injection What is this medicine? FERUMOXYTOL is an iron complex. Iron is used to make healthy red blood cells, which carry oxygen and nutrients throughout the body. This medicine is used to treat iron deficiency anemia. This medicine may be used for other purposes; ask your health care provider or pharmacist if you have questions. COMMON BRAND NAME(S): Feraheme What should I tell my health care provider before I take this medicine? They need to know if you have any of these conditions:  anemia not caused by low iron levels  high levels of iron in the blood  magnetic resonance imaging (MRI) test scheduled  an unusual or allergic reaction to iron, other medicines, foods, dyes, or preservatives  pregnant or trying to get pregnant  breast-feeding How should I use this medicine? This medicine is for injection into a vein. It is given by a health care professional in a hospital or clinic setting. Talk to your pediatrician regarding the use of this medicine in children. Special care may be needed. Overdosage: If you think you have taken too much of this medicine contact a poison control center or emergency room at once. NOTE: This medicine is only for you. Do not share this medicine with others. What if I miss a dose? It is important not to miss your dose. Call your doctor or health care professional if you are unable to keep an appointment. What may interact with this medicine? This medicine may interact with the following medications:  other iron products This list may not describe all possible interactions. Give your health care provider a list of all the medicines, herbs, non-prescription drugs, or dietary supplements you use. Also tell them if you smoke, drink alcohol, or use illegal drugs. Some items may interact with your medicine. What should I watch for while using this medicine? Visit your doctor or healthcare professional regularly. Tell your doctor or healthcare  professional if your symptoms do not start to get better or if they get worse. You may need blood work done while you are taking this medicine. You may need to follow a special diet. Talk to your doctor. Foods that contain iron include: whole grains/cereals, dried fruits, beans, or peas, leafy green vegetables, and organ meats (liver, kidney). What side effects may I notice from receiving this medicine? Side effects that you should report to your doctor or health care professional as soon as possible:  allergic reactions like skin rash, itching or hives, swelling of the face, lips, or tongue  breathing problems  changes in blood pressure  feeling faint or lightheaded, falls  fever or chills  flushing, sweating, or hot feelings  swelling of the ankles or feet Side effects that usually do not require medical attention (report to your doctor or health care professional if they continue or are bothersome):  diarrhea  headache  nausea, vomiting  stomach pain This list may not describe all possible side effects. Call your doctor for medical advice about side effects. You may report side effects to FDA at 1-800-FDA-1088. Where should I keep my medicine? This drug is given in a hospital or clinic and will not be stored at home. NOTE: This sheet is a summary. It may not cover all possible information. If you have questions about this medicine, talk to your doctor, pharmacist, or health care provider.  2020 Elsevier/Gold Standard (2016-04-12 20:21:10)    Blood Transfusion, Adult, Care After This sheet gives you information about how to care for yourself after your procedure. Your doctor   may also give you more specific instructions. If you have problems or questions, contact your doctor. Follow these instructions at home:   Take over-the-counter and prescription medicines only as told by your doctor.  Go back to your normal activities as told by your doctor.  Follow instructions  from your doctor about how to take care of the area where an IV tube was put into your vein (insertion site). Make sure you: ? Wash your hands with soap and water before you change your bandage (dressing). If there is no soap and water, use hand sanitizer. ? Change your bandage as told by your doctor.  Check your IV insertion site every day for signs of infection. Check for: ? More redness, swelling, or pain. ? More fluid or blood. ? Warmth. ? Pus or a bad smell. Contact a doctor if:  You have more redness, swelling, or pain around the IV insertion site.  You have more fluid or blood coming from the IV insertion site.  Your IV insertion site feels warm to the touch.  You have pus or a bad smell coming from the IV insertion site.  Your pee (urine) turns pink, red, or brown.  You feel weak after doing your normal activities. Get help right away if:  You have signs of a serious allergic or body defense (immune) system reaction, including: ? Itchiness. ? Hives. ? Trouble breathing. ? Anxiety. ? Pain in your chest or lower back. ? Fever, flushing, and chills. ? Fast pulse. ? Rash. ? Watery poop (diarrhea). ? Throwing up (vomiting). ? Dark pee. ? Serious headache. ? Dizziness. ? Stiff neck. ? Yellow color in your face or the white parts of your eyes (jaundice). Summary  After a blood transfusion, return to your normal activities as told by your doctor.  Every day, check for signs of infection where the IV tube was put into your vein.  Some signs of infection are warm skin, more redness and pain, more fluid or blood, and pus or a bad smell where the needle went in.  Contact your doctor if you feel weak or have any unusual symptoms. This information is not intended to replace advice given to you by your health care provider. Make sure you discuss any questions you have with your health care provider. Document Released: 03/15/2014 Document Revised: 06/29/2017 Document  Reviewed: 10/17/2015 Elsevier Patient Education  2020 Elsevier Inc.  

## 2019-04-02 ENCOUNTER — Telehealth: Payer: Self-pay | Admitting: Oncology

## 2019-04-02 NOTE — Telephone Encounter (Signed)
Rescheduled per 1/25 sch msg. Called and spoke with Britta Mccreedy (daughter), confirmed 3/2 appt

## 2019-04-05 ENCOUNTER — Other Ambulatory Visit: Payer: Self-pay

## 2019-04-05 ENCOUNTER — Inpatient Hospital Stay: Payer: Medicare Other

## 2019-04-05 VITALS — BP 117/43 | HR 97 | Temp 99.7°F | Resp 20

## 2019-04-05 DIAGNOSIS — D509 Iron deficiency anemia, unspecified: Secondary | ICD-10-CM

## 2019-04-05 DIAGNOSIS — D649 Anemia, unspecified: Secondary | ICD-10-CM

## 2019-04-05 DIAGNOSIS — Z79899 Other long term (current) drug therapy: Secondary | ICD-10-CM | POA: Diagnosis not present

## 2019-04-05 DIAGNOSIS — I129 Hypertensive chronic kidney disease with stage 1 through stage 4 chronic kidney disease, or unspecified chronic kidney disease: Secondary | ICD-10-CM | POA: Diagnosis not present

## 2019-04-05 DIAGNOSIS — D631 Anemia in chronic kidney disease: Secondary | ICD-10-CM

## 2019-04-05 DIAGNOSIS — N189 Chronic kidney disease, unspecified: Secondary | ICD-10-CM | POA: Diagnosis not present

## 2019-04-05 DIAGNOSIS — M79676 Pain in unspecified toe(s): Secondary | ICD-10-CM | POA: Diagnosis not present

## 2019-04-05 DIAGNOSIS — Z7982 Long term (current) use of aspirin: Secondary | ICD-10-CM | POA: Diagnosis not present

## 2019-04-05 MED ORDER — SODIUM CHLORIDE 0.9 % IV SOLN
Freq: Once | INTRAVENOUS | Status: AC
  Filled 2019-04-05: qty 250

## 2019-04-05 MED ORDER — SODIUM CHLORIDE 0.9 % IV SOLN
510.0000 mg | Freq: Once | INTRAVENOUS | Status: AC
  Administered 2019-04-05: 510 mg via INTRAVENOUS
  Filled 2019-04-05: qty 510

## 2019-04-05 NOTE — Patient Instructions (Signed)

## 2019-04-12 DIAGNOSIS — L03031 Cellulitis of right toe: Secondary | ICD-10-CM | POA: Diagnosis not present

## 2019-04-17 ENCOUNTER — Telehealth: Payer: Self-pay | Admitting: Oncology

## 2019-04-17 NOTE — Telephone Encounter (Signed)
Rescheduled per 2/9 sch msg, pt req. Called and spoke with Britta Mccreedy (daughter), confirmed 3/4 appt

## 2019-04-18 ENCOUNTER — Other Ambulatory Visit: Payer: Self-pay

## 2019-04-18 ENCOUNTER — Inpatient Hospital Stay: Payer: Medicare Other

## 2019-04-18 ENCOUNTER — Inpatient Hospital Stay: Payer: Medicare Other | Attending: Oncology

## 2019-04-18 VITALS — BP 120/70 | HR 108 | Temp 98.5°F | Resp 20

## 2019-04-18 DIAGNOSIS — D649 Anemia, unspecified: Secondary | ICD-10-CM

## 2019-04-18 DIAGNOSIS — N189 Chronic kidney disease, unspecified: Secondary | ICD-10-CM | POA: Insufficient documentation

## 2019-04-18 DIAGNOSIS — I129 Hypertensive chronic kidney disease with stage 1 through stage 4 chronic kidney disease, or unspecified chronic kidney disease: Secondary | ICD-10-CM | POA: Diagnosis not present

## 2019-04-18 DIAGNOSIS — Z79899 Other long term (current) drug therapy: Secondary | ICD-10-CM | POA: Insufficient documentation

## 2019-04-18 DIAGNOSIS — D631 Anemia in chronic kidney disease: Secondary | ICD-10-CM

## 2019-04-18 DIAGNOSIS — D509 Iron deficiency anemia, unspecified: Secondary | ICD-10-CM

## 2019-04-18 LAB — CBC WITH DIFFERENTIAL (CANCER CENTER ONLY)
Abs Immature Granulocytes: 0.02 10*3/uL (ref 0.00–0.07)
Basophils Absolute: 0 10*3/uL (ref 0.0–0.1)
Basophils Relative: 1 %
Eosinophils Absolute: 0.1 10*3/uL (ref 0.0–0.5)
Eosinophils Relative: 1 %
HCT: 30.6 % — ABNORMAL LOW (ref 36.0–46.0)
Hemoglobin: 9.6 g/dL — ABNORMAL LOW (ref 12.0–15.0)
Immature Granulocytes: 0 %
Lymphocytes Relative: 7 %
Lymphs Abs: 0.4 10*3/uL — ABNORMAL LOW (ref 0.7–4.0)
MCH: 31.6 pg (ref 26.0–34.0)
MCHC: 31.4 g/dL (ref 30.0–36.0)
MCV: 100.7 fL — ABNORMAL HIGH (ref 80.0–100.0)
Monocytes Absolute: 0.6 10*3/uL (ref 0.1–1.0)
Monocytes Relative: 9 %
Neutro Abs: 5.2 10*3/uL (ref 1.7–7.7)
Neutrophils Relative %: 82 %
Platelet Count: 281 10*3/uL (ref 150–400)
RBC: 3.04 MIL/uL — ABNORMAL LOW (ref 3.87–5.11)
RDW: 19.8 % — ABNORMAL HIGH (ref 11.5–15.5)
WBC Count: 6.4 10*3/uL (ref 4.0–10.5)
nRBC: 0 % (ref 0.0–0.2)

## 2019-04-18 LAB — SAMPLE TO BLOOD BANK

## 2019-04-18 MED ORDER — DARBEPOETIN ALFA 300 MCG/0.6ML IJ SOSY
PREFILLED_SYRINGE | INTRAMUSCULAR | Status: AC
  Filled 2019-04-18: qty 0.6

## 2019-04-18 MED ORDER — DARBEPOETIN ALFA 300 MCG/0.6ML IJ SOSY
300.0000 ug | PREFILLED_SYRINGE | Freq: Once | INTRAMUSCULAR | Status: AC
  Administered 2019-04-18: 300 ug via SUBCUTANEOUS

## 2019-04-18 NOTE — Patient Instructions (Signed)
Darbepoetin Alfa injection What is this medicine? DARBEPOETIN ALFA (dar be POE e tin AL fa) helps your body make more red blood cells. It is used to treat anemia caused by chronic kidney failure and chemotherapy. This medicine may be used for other purposes; ask your health care provider or pharmacist if you have questions. COMMON BRAND NAME(S): Aranesp What should I tell my health care provider before I take this medicine? They need to know if you have any of these conditions:  blood clotting disorders or history of blood clots  cancer patient not on chemotherapy  cystic fibrosis  heart disease, such as angina, heart failure, or a history of a heart attack  hemoglobin level of 12 g/dL or greater  high blood pressure  low levels of folate, iron, or vitamin B12  seizures  an unusual or allergic reaction to darbepoetin, erythropoietin, albumin, hamster proteins, latex, other medicines, foods, dyes, or preservatives  pregnant or trying to get pregnant  breast-feeding How should I use this medicine? This medicine is for injection into a vein or under the skin. It is usually given by a health care professional in a hospital or clinic setting. If you get this medicine at home, you will be taught how to prepare and give this medicine. Use exactly as directed. Take your medicine at regular intervals. Do not take your medicine more often than directed. It is important that you put your used needles and syringes in a special sharps container. Do not put them in a trash can. If you do not have a sharps container, call your pharmacist or healthcare provider to get one. A special MedGuide will be given to you by the pharmacist with each prescription and refill. Be sure to read this information carefully each time. Talk to your pediatrician regarding the use of this medicine in children. While this medicine may be used in children as young as 1 month of age for selected conditions, precautions do  apply. Overdosage: If you think you have taken too much of this medicine contact a poison control center or emergency room at once. NOTE: This medicine is only for you. Do not share this medicine with others. What if I miss a dose? If you miss a dose, take it as soon as you can. If it is almost time for your next dose, take only that dose. Do not take double or extra doses. What may interact with this medicine? Do not take this medicine with any of the following medications:  epoetin alfa This list may not describe all possible interactions. Give your health care provider a list of all the medicines, herbs, non-prescription drugs, or dietary supplements you use. Also tell them if you smoke, drink alcohol, or use illegal drugs. Some items may interact with your medicine. What should I watch for while using this medicine? Your condition will be monitored carefully while you are receiving this medicine. You may need blood work done while you are taking this medicine. This medicine may cause a decrease in vitamin B6. You should make sure that you get enough vitamin B6 while you are taking this medicine. Discuss the foods you eat and the vitamins you take with your health care professional. What side effects may I notice from receiving this medicine? Side effects that you should report to your doctor or health care professional as soon as possible:  allergic reactions like skin rash, itching or hives, swelling of the face, lips, or tongue  breathing problems  changes in   vision  chest pain  confusion, trouble speaking or understanding  feeling faint or lightheaded, falls  high blood pressure  muscle aches or pains  pain, swelling, warmth in the leg  rapid weight gain  severe headaches  sudden numbness or weakness of the face, arm or leg  trouble walking, dizziness, loss of balance or coordination  seizures (convulsions)  swelling of the ankles, feet, hands  unusually weak or  tired Side effects that usually do not require medical attention (report to your doctor or health care professional if they continue or are bothersome):  diarrhea  fever, chills (flu-like symptoms)  headaches  nausea, vomiting  redness, stinging, or swelling at site where injected This list may not describe all possible side effects. Call your doctor for medical advice about side effects. You may report side effects to FDA at 1-800-FDA-1088. Where should I keep my medicine? Keep out of the reach of children. Store in a refrigerator between 2 and 8 degrees C (36 and 46 degrees F). Do not freeze. Do not shake. Throw away any unused portion if using a single-dose vial. Throw away any unused medicine after the expiration date. NOTE: This sheet is a summary. It may not cover all possible information. If you have questions about this medicine, talk to your doctor, pharmacist, or health care provider.  2020 Elsevier/Gold Standard (2017-03-09 16:44:20)  

## 2019-04-23 DIAGNOSIS — L600 Ingrowing nail: Secondary | ICD-10-CM | POA: Diagnosis not present

## 2019-05-03 DIAGNOSIS — H401131 Primary open-angle glaucoma, bilateral, mild stage: Secondary | ICD-10-CM | POA: Diagnosis not present

## 2019-05-03 DIAGNOSIS — Z961 Presence of intraocular lens: Secondary | ICD-10-CM | POA: Diagnosis not present

## 2019-05-08 ENCOUNTER — Other Ambulatory Visit: Payer: Medicare Other

## 2019-05-08 ENCOUNTER — Ambulatory Visit: Payer: Medicare Other

## 2019-05-09 ENCOUNTER — Other Ambulatory Visit: Payer: Medicare Other

## 2019-05-09 ENCOUNTER — Ambulatory Visit: Payer: Medicare Other

## 2019-05-10 ENCOUNTER — Inpatient Hospital Stay: Payer: Medicare Other

## 2019-05-10 ENCOUNTER — Other Ambulatory Visit: Payer: Self-pay

## 2019-05-10 ENCOUNTER — Inpatient Hospital Stay: Payer: Medicare Other | Attending: Oncology

## 2019-05-10 VITALS — BP 118/59 | HR 88 | Temp 98.2°F | Resp 18

## 2019-05-10 DIAGNOSIS — Z79899 Other long term (current) drug therapy: Secondary | ICD-10-CM | POA: Diagnosis not present

## 2019-05-10 DIAGNOSIS — D649 Anemia, unspecified: Secondary | ICD-10-CM

## 2019-05-10 DIAGNOSIS — N189 Chronic kidney disease, unspecified: Secondary | ICD-10-CM

## 2019-05-10 DIAGNOSIS — D631 Anemia in chronic kidney disease: Secondary | ICD-10-CM | POA: Diagnosis not present

## 2019-05-10 DIAGNOSIS — D509 Iron deficiency anemia, unspecified: Secondary | ICD-10-CM

## 2019-05-10 DIAGNOSIS — I129 Hypertensive chronic kidney disease with stage 1 through stage 4 chronic kidney disease, or unspecified chronic kidney disease: Secondary | ICD-10-CM | POA: Diagnosis not present

## 2019-05-10 DIAGNOSIS — Z7982 Long term (current) use of aspirin: Secondary | ICD-10-CM | POA: Insufficient documentation

## 2019-05-10 LAB — CMP (CANCER CENTER ONLY)
ALT: 16 U/L (ref 0–44)
AST: 18 U/L (ref 15–41)
Albumin: 3.1 g/dL — ABNORMAL LOW (ref 3.5–5.0)
Alkaline Phosphatase: 75 U/L (ref 38–126)
Anion gap: 7 (ref 5–15)
BUN: 21 mg/dL (ref 8–23)
CO2: 29 mmol/L (ref 22–32)
Calcium: 8.6 mg/dL — ABNORMAL LOW (ref 8.9–10.3)
Chloride: 104 mmol/L (ref 98–111)
Creatinine: 0.84 mg/dL (ref 0.44–1.00)
GFR, Est AFR Am: >60 mL/min (ref >60)
GFR, Estimated: 60 mL/min — ABNORMAL LOW (ref >60)
Glucose, Bld: 116 mg/dL — ABNORMAL HIGH (ref 70–99)
Potassium: 4.7 mmol/L (ref 3.5–5.1)
Sodium: 140 mmol/L (ref 135–145)
Total Bilirubin: <0.2 mg/dL — ABNORMAL LOW (ref 0.3–1.2)
Total Protein: 5.6 g/dL — ABNORMAL LOW (ref 6.5–8.1)

## 2019-05-10 LAB — CBC WITH DIFFERENTIAL (CANCER CENTER ONLY)
Abs Immature Granulocytes: 0.03 10*3/uL (ref 0.00–0.07)
Basophils Absolute: 0 10*3/uL (ref 0.0–0.1)
Basophils Relative: 0 %
Eosinophils Absolute: 0.1 10*3/uL (ref 0.0–0.5)
Eosinophils Relative: 1 %
HCT: 27.7 % — ABNORMAL LOW (ref 36.0–46.0)
Hemoglobin: 8.7 g/dL — ABNORMAL LOW (ref 12.0–15.0)
Immature Granulocytes: 0 %
Lymphocytes Relative: 6 %
Lymphs Abs: 0.6 10*3/uL — ABNORMAL LOW (ref 0.7–4.0)
MCH: 30.3 pg (ref 26.0–34.0)
MCHC: 31.4 g/dL (ref 30.0–36.0)
MCV: 96.5 fL (ref 80.0–100.0)
Monocytes Absolute: 0.8 10*3/uL (ref 0.1–1.0)
Monocytes Relative: 9 %
Neutro Abs: 7.2 10*3/uL (ref 1.7–7.7)
Neutrophils Relative %: 84 %
Platelet Count: 275 10*3/uL (ref 150–400)
RBC: 2.87 MIL/uL — ABNORMAL LOW (ref 3.87–5.11)
RDW: 17 % — ABNORMAL HIGH (ref 11.5–15.5)
WBC Count: 8.7 10*3/uL (ref 4.0–10.5)
nRBC: 0 % (ref 0.0–0.2)

## 2019-05-10 LAB — SAMPLE TO BLOOD BANK

## 2019-05-10 MED ORDER — DARBEPOETIN ALFA 300 MCG/0.6ML IJ SOSY
300.0000 ug | PREFILLED_SYRINGE | Freq: Once | INTRAMUSCULAR | Status: AC
  Administered 2019-05-10: 300 ug via SUBCUTANEOUS

## 2019-05-10 MED ORDER — DARBEPOETIN ALFA 300 MCG/0.6ML IJ SOSY
PREFILLED_SYRINGE | INTRAMUSCULAR | Status: AC
  Filled 2019-05-10: qty 0.6

## 2019-05-10 NOTE — Patient Instructions (Signed)
Darbepoetin Alfa injection What is this medicine? DARBEPOETIN ALFA (dar be POE e tin AL fa) helps your body make more red blood cells. It is used to treat anemia caused by chronic kidney failure and chemotherapy. This medicine may be used for other purposes; ask your health care provider or pharmacist if you have questions. COMMON BRAND NAME(S): Aranesp What should I tell my health care provider before I take this medicine? They need to know if you have any of these conditions:  blood clotting disorders or history of blood clots  cancer patient not on chemotherapy  cystic fibrosis  heart disease, such as angina, heart failure, or a history of a heart attack  hemoglobin level of 12 g/dL or greater  high blood pressure  low levels of folate, iron, or vitamin B12  seizures  an unusual or allergic reaction to darbepoetin, erythropoietin, albumin, hamster proteins, latex, other medicines, foods, dyes, or preservatives  pregnant or trying to get pregnant  breast-feeding How should I use this medicine? This medicine is for injection into a vein or under the skin. It is usually given by a health care professional in a hospital or clinic setting. If you get this medicine at home, you will be taught how to prepare and give this medicine. Use exactly as directed. Take your medicine at regular intervals. Do not take your medicine more often than directed. It is important that you put your used needles and syringes in a special sharps container. Do not put them in a trash can. If you do not have a sharps container, call your pharmacist or healthcare provider to get one. A special MedGuide will be given to you by the pharmacist with each prescription and refill. Be sure to read this information carefully each time. Talk to your pediatrician regarding the use of this medicine in children. While this medicine may be used in children as young as 1 month of age for selected conditions, precautions do  apply. Overdosage: If you think you have taken too much of this medicine contact a poison control center or emergency room at once. NOTE: This medicine is only for you. Do not share this medicine with others. What if I miss a dose? If you miss a dose, take it as soon as you can. If it is almost time for your next dose, take only that dose. Do not take double or extra doses. What may interact with this medicine? Do not take this medicine with any of the following medications:  epoetin alfa This list may not describe all possible interactions. Give your health care provider a list of all the medicines, herbs, non-prescription drugs, or dietary supplements you use. Also tell them if you smoke, drink alcohol, or use illegal drugs. Some items may interact with your medicine. What should I watch for while using this medicine? Your condition will be monitored carefully while you are receiving this medicine. You may need blood work done while you are taking this medicine. This medicine may cause a decrease in vitamin B6. You should make sure that you get enough vitamin B6 while you are taking this medicine. Discuss the foods you eat and the vitamins you take with your health care professional. What side effects may I notice from receiving this medicine? Side effects that you should report to your doctor or health care professional as soon as possible:  allergic reactions like skin rash, itching or hives, swelling of the face, lips, or tongue  breathing problems  changes in   vision  chest pain  confusion, trouble speaking or understanding  feeling faint or lightheaded, falls  high blood pressure  muscle aches or pains  pain, swelling, warmth in the leg  rapid weight gain  severe headaches  sudden numbness or weakness of the face, arm or leg  trouble walking, dizziness, loss of balance or coordination  seizures (convulsions)  swelling of the ankles, feet, hands  unusually weak or  tired Side effects that usually do not require medical attention (report to your doctor or health care professional if they continue or are bothersome):  diarrhea  fever, chills (flu-like symptoms)  headaches  nausea, vomiting  redness, stinging, or swelling at site where injected This list may not describe all possible side effects. Call your doctor for medical advice about side effects. You may report side effects to FDA at 1-800-FDA-1088. Where should I keep my medicine? Keep out of the reach of children. Store in a refrigerator between 2 and 8 degrees C (36 and 46 degrees F). Do not freeze. Do not shake. Throw away any unused portion if using a single-dose vial. Throw away any unused medicine after the expiration date. NOTE: This sheet is a summary. It may not cover all possible information. If you have questions about this medicine, talk to your doctor, pharmacist, or health care provider.  2020 Elsevier/Gold Standard (2017-03-09 16:44:20)  

## 2019-05-11 LAB — IRON AND TIBC
Iron: 30 ug/dL — ABNORMAL LOW (ref 41–142)
Saturation Ratios: 13 % — ABNORMAL LOW (ref 21–57)
TIBC: 228 ug/dL — ABNORMAL LOW (ref 236–444)
UIBC: 198 ug/dL (ref 120–384)

## 2019-05-11 LAB — FERRITIN: Ferritin: 46 ng/mL (ref 11–307)

## 2019-05-22 DIAGNOSIS — B351 Tinea unguium: Secondary | ICD-10-CM | POA: Diagnosis not present

## 2019-05-22 DIAGNOSIS — M79672 Pain in left foot: Secondary | ICD-10-CM | POA: Diagnosis not present

## 2019-05-22 DIAGNOSIS — L84 Corns and callosities: Secondary | ICD-10-CM | POA: Diagnosis not present

## 2019-05-22 DIAGNOSIS — M79671 Pain in right foot: Secondary | ICD-10-CM | POA: Diagnosis not present

## 2019-05-30 ENCOUNTER — Other Ambulatory Visit: Payer: Self-pay

## 2019-05-30 ENCOUNTER — Inpatient Hospital Stay (HOSPITAL_BASED_OUTPATIENT_CLINIC_OR_DEPARTMENT_OTHER): Payer: Medicare Other | Admitting: Oncology

## 2019-05-30 ENCOUNTER — Telehealth: Payer: Self-pay

## 2019-05-30 ENCOUNTER — Inpatient Hospital Stay: Payer: Medicare Other

## 2019-05-30 ENCOUNTER — Other Ambulatory Visit: Payer: Self-pay | Admitting: Oncology

## 2019-05-30 VITALS — BP 144/65 | HR 116 | Temp 98.7°F | Resp 18 | Ht 61.0 in | Wt 138.9 lb

## 2019-05-30 DIAGNOSIS — Z79899 Other long term (current) drug therapy: Secondary | ICD-10-CM | POA: Diagnosis not present

## 2019-05-30 DIAGNOSIS — D631 Anemia in chronic kidney disease: Secondary | ICD-10-CM

## 2019-05-30 DIAGNOSIS — D649 Anemia, unspecified: Secondary | ICD-10-CM

## 2019-05-30 DIAGNOSIS — D509 Iron deficiency anemia, unspecified: Secondary | ICD-10-CM

## 2019-05-30 DIAGNOSIS — N189 Chronic kidney disease, unspecified: Secondary | ICD-10-CM | POA: Diagnosis not present

## 2019-05-30 DIAGNOSIS — I129 Hypertensive chronic kidney disease with stage 1 through stage 4 chronic kidney disease, or unspecified chronic kidney disease: Secondary | ICD-10-CM | POA: Diagnosis not present

## 2019-05-30 DIAGNOSIS — Z7982 Long term (current) use of aspirin: Secondary | ICD-10-CM | POA: Diagnosis not present

## 2019-05-30 LAB — CBC WITH DIFFERENTIAL (CANCER CENTER ONLY)
Abs Immature Granulocytes: 0.03 10*3/uL (ref 0.00–0.07)
Basophils Absolute: 0.1 10*3/uL (ref 0.0–0.1)
Basophils Relative: 1 %
Eosinophils Absolute: 0.1 10*3/uL (ref 0.0–0.5)
Eosinophils Relative: 1 %
HCT: 24.9 % — ABNORMAL LOW (ref 36.0–46.0)
Hemoglobin: 7.7 g/dL — ABNORMAL LOW (ref 12.0–15.0)
Immature Granulocytes: 0 %
Lymphocytes Relative: 6 %
Lymphs Abs: 0.6 10*3/uL — ABNORMAL LOW (ref 0.7–4.0)
MCH: 28.6 pg (ref 26.0–34.0)
MCHC: 30.9 g/dL (ref 30.0–36.0)
MCV: 92.6 fL (ref 80.0–100.0)
Monocytes Absolute: 1 10*3/uL (ref 0.1–1.0)
Monocytes Relative: 11 %
Neutro Abs: 7.7 10*3/uL (ref 1.7–7.7)
Neutrophils Relative %: 81 %
Platelet Count: 303 10*3/uL (ref 150–400)
RBC: 2.69 MIL/uL — ABNORMAL LOW (ref 3.87–5.11)
RDW: 17 % — ABNORMAL HIGH (ref 11.5–15.5)
WBC Count: 9.5 10*3/uL (ref 4.0–10.5)
nRBC: 0 % (ref 0.0–0.2)

## 2019-05-30 LAB — SAMPLE TO BLOOD BANK

## 2019-05-30 LAB — IRON AND TIBC
Iron: 18 ug/dL — ABNORMAL LOW (ref 41–142)
Saturation Ratios: 7 % — ABNORMAL LOW (ref 21–57)
TIBC: 271 ug/dL (ref 236–444)
UIBC: 254 ug/dL (ref 120–384)

## 2019-05-30 LAB — PREPARE RBC (CROSSMATCH)

## 2019-05-30 LAB — FERRITIN: Ferritin: 15 ng/mL (ref 11–307)

## 2019-05-30 MED ORDER — DARBEPOETIN ALFA 300 MCG/0.6ML IJ SOSY
300.0000 ug | PREFILLED_SYRINGE | Freq: Once | INTRAMUSCULAR | Status: AC
  Administered 2019-05-30: 300 ug via SUBCUTANEOUS

## 2019-05-30 NOTE — Patient Instructions (Signed)
Darbepoetin Alfa injection What is this medicine? DARBEPOETIN ALFA (dar be POE e tin AL fa) helps your body make more red blood cells. It is used to treat anemia caused by chronic kidney failure and chemotherapy. This medicine may be used for other purposes; ask your health care provider or pharmacist if you have questions. COMMON BRAND NAME(S): Aranesp What should I tell my health care provider before I take this medicine? They need to know if you have any of these conditions:  blood clotting disorders or history of blood clots  cancer patient not on chemotherapy  cystic fibrosis  heart disease, such as angina, heart failure, or a history of a heart attack  hemoglobin level of 12 g/dL or greater  high blood pressure  low levels of folate, iron, or vitamin B12  seizures  an unusual or allergic reaction to darbepoetin, erythropoietin, albumin, hamster proteins, latex, other medicines, foods, dyes, or preservatives  pregnant or trying to get pregnant  breast-feeding How should I use this medicine? This medicine is for injection into a vein or under the skin. It is usually given by a health care professional in a hospital or clinic setting. If you get this medicine at home, you will be taught how to prepare and give this medicine. Use exactly as directed. Take your medicine at regular intervals. Do not take your medicine more often than directed. It is important that you put your used needles and syringes in a special sharps container. Do not put them in a trash can. If you do not have a sharps container, call your pharmacist or healthcare provider to get one. A special MedGuide will be given to you by the pharmacist with each prescription and refill. Be sure to read this information carefully each time. Talk to your pediatrician regarding the use of this medicine in children. While this medicine may be used in children as young as 1 month of age for selected conditions, precautions do  apply. Overdosage: If you think you have taken too much of this medicine contact a poison control center or emergency room at once. NOTE: This medicine is only for you. Do not share this medicine with others. What if I miss a dose? If you miss a dose, take it as soon as you can. If it is almost time for your next dose, take only that dose. Do not take double or extra doses. What may interact with this medicine? Do not take this medicine with any of the following medications:  epoetin alfa This list may not describe all possible interactions. Give your health care provider a list of all the medicines, herbs, non-prescription drugs, or dietary supplements you use. Also tell them if you smoke, drink alcohol, or use illegal drugs. Some items may interact with your medicine. What should I watch for while using this medicine? Your condition will be monitored carefully while you are receiving this medicine. You may need blood work done while you are taking this medicine. This medicine may cause a decrease in vitamin B6. You should make sure that you get enough vitamin B6 while you are taking this medicine. Discuss the foods you eat and the vitamins you take with your health care professional. What side effects may I notice from receiving this medicine? Side effects that you should report to your doctor or health care professional as soon as possible:  allergic reactions like skin rash, itching or hives, swelling of the face, lips, or tongue  breathing problems  changes in   vision  chest pain  confusion, trouble speaking or understanding  feeling faint or lightheaded, falls  high blood pressure  muscle aches or pains  pain, swelling, warmth in the leg  rapid weight gain  severe headaches  sudden numbness or weakness of the face, arm or leg  trouble walking, dizziness, loss of balance or coordination  seizures (convulsions)  swelling of the ankles, feet, hands  unusually weak or  tired Side effects that usually do not require medical attention (report to your doctor or health care professional if they continue or are bothersome):  diarrhea  fever, chills (flu-like symptoms)  headaches  nausea, vomiting  redness, stinging, or swelling at site where injected This list may not describe all possible side effects. Call your doctor for medical advice about side effects. You may report side effects to FDA at 1-800-FDA-1088. Where should I keep my medicine? Keep out of the reach of children. Store in a refrigerator between 2 and 8 degrees C (36 and 46 degrees F). Do not freeze. Do not shake. Throw away any unused portion if using a single-dose vial. Throw away any unused medicine after the expiration date. NOTE: This sheet is a summary. It may not cover all possible information. If you have questions about this medicine, talk to your doctor, pharmacist, or health care provider.  2020 Elsevier/Gold Standard (2017-03-09 16:44:20)  

## 2019-05-30 NOTE — Progress Notes (Signed)
Hematology and Oncology Follow Up Visit  Lisa [name] [id number] Aug 12, 1923 86 y.o. 05/30/2019 2:44 PM [name], MDBarnes, [name], MD   Principle Diagnosis: 86 year old woman with iron deficiency anemia diagnosed in 2018.  She has element of anemia of chronic disease, renal insufficiency as well.        Prior Therapy: IV iron infusion infusion as well as packed red cell transfusion.  Current therapy:   Aranesp 300 mcg every 3 weeks   More Feraheme at 1000 mg every 6 to 9 weeks.  Supportive packed red cell transfusion for hemoglobin below 8.    Interim History: Lisa [name] is here for return evaluation.  Since the last visit, she reports no major changes in her health. She denies any excessive fatigue or tiredness. She denies any recent hospitalization or illnesses. Denies any hematochezia, melena or weight loss. Performance status still limited but unchanged. She still uses a walker and resides in a senior living facility.                 Medications: Unchanged on review. Current Outpatient Medications  Medication Sig Dispense Refill  . acetaminophen (TYLENOL) 500 MG tablet Take 500-1,000 mg by mouth at bedtime.    Marland Kitchen aspirin EC 81 MG tablet Take 81 mg by mouth daily.    . calcium carbonate (OS-CAL) 600 MG TABS Take 600 mg by mouth daily.     Marland Kitchen loratadine (CLARITIN) 10 MG tablet Take 10 mg by mouth daily as needed.     . Multiple Vitamins-Minerals (MULTIVITAMIN WITH MINERALS) tablet Take 1 tablet by mouth daily.    Marland Kitchen OVER THE COUNTER MEDICATION Apply topically as needed. Cream for arthritic pain. Patient unable to recall the name at this time.    Marland Kitchen oxybutynin (DITROPAN) 5 MG tablet Take 2.5 mg by mouth 2 (two) times daily.  2  . pramipexole (MIRAPEX) 0.25 MG tablet Take 0.25 mg by mouth at bedtime.    . primidone (MYSOLINE) 50 MG tablet Take 3 tablets (150 mg total) by mouth at bedtime. 270 tablet 3  . raloxifene (EVISTA) 60 MG tablet Take 60 mg by mouth  at bedtime.     . timolol (TIMOPTIC) 0.5 % ophthalmic solution Place 1 drop into both eyes daily.   1  . UNABLE TO FIND Med Name: Aranesp infusions every 3 weeks administered by [facility]    . VYTORIN 10-20 MG tablet Take 1 tablet by mouth daily.  0   No current facility-administered medications for this visit.     Allergies:  Allergies  Allergen Reactions  . Mycostatin [Nystatin] Swelling and Rash      Physical Exam:    Blood pressure (!) 144/65, pulse (!) 116, temperature 98.7 F (37.1 C), temperature source Temporal, resp. rate 18, height 5\' 1"  (1.549 m), weight 138 lb 14.4 oz (63 kg), SpO2 97 %.       ECOG: 1    General appearance: Comfortable appearing without any discomfort Head: Normocephalic without any trauma Oropharynx: Mucous membranes are moist and pink without any thrush or ulcers. Eyes: Pupils are equal and round reactive to light. Lymph nodes: No cervical, supraclavicular, inguinal or axillary lymphadenopathy.   Heart:regular rate and rhythm.  S1 and S2 without leg edema. Lung: Clear without any rhonchi or wheezes.  No dullness to percussion. Abdomin: Soft, nontender, nondistended with good bowel sounds.  No hepatosplenomegaly. Musculoskeletal: No joint deformity or effusion.  Full range of motion noted. Neurological: No deficits noted on motor,  sensory and deep tendon reflex exam. Skin: No petechial rash or dryness.  Appeared moist.  .           Lab Results: Lab Results  Component Value Date   WBC 8.7 05/10/2019   HGB 8.7 (L) 05/10/2019   HCT 27.7 (L) 05/10/2019   MCV 96.5 05/10/2019   PLT 275 05/10/2019     Chemistry      Component Value Date/Time   NA 140 05/10/2019 1437   NA 135 (A) 04/01/2016 0000   K 4.7 05/10/2019 1437   CL 104 05/10/2019 1437   CO2 29 05/10/2019 1437   BUN 21 05/10/2019 1437   BUN 19 04/01/2016 0000   CREATININE 0.84 05/10/2019 1437   GLU 102 04/01/2016 0000      Component Value  Date/Time   CALCIUM 8.6 (L) 05/10/2019 1437   ALKPHOS 75 05/10/2019 1437   AST 18 05/10/2019 1437   ALT 16 05/10/2019 1437   BILITOT <0.2 (L) 05/10/2019 1437       Impression and Plan:  86 year old woman with:   1.  Multifactorial anemia with element of renal insufficiency diagnosed in 2018.    She has tolerated Aranesp reasonably well in addition to other supportive measures.  Her hemoglobin today is 7.7 although she is not symptomatic.  Treatment options are reviewed at this time.  Complication associated with continuing Aranesp were reviewed include thromboembolism and hypertension.  Risks and benefits of packed red cell transfusion was also reviewed.  After discussion today, we will proceed with Aranesp today and she will receive 1 unit of packed red cell transfusion on May 31, 2019. She has not received a transfusion for November which is an encouraging sign.  2.  Iron deficiency: Continues to require intravenous iron periodically.  Iron studies obtained on March 4 showed iron level of 30 and ferritin of 46 with saturation of 13%.  Risks and benefits of repeat intravenous iron was discussed today.  Potential complications that include arthralgias, myalgias and anaphylaxis were reiterated. He is agreeable to proceed at this time.      3.  Follow-up: Every 3 weeks for Aranesp and in 9 weeks for MD follow-up.   30 minutes dedicated to the visit.  Time was spent on reviewing laboratory data, treatment options and addressing complications related to therapy     Zola Button, MD 3/24/20212:44 PM

## 2019-05-30 NOTE — Telephone Encounter (Signed)
Spoke to [place] in blood bank to confirm orders for tomorrow's blood transfusion have been received. Patient is aware of 1:45 appointment tomorrow and verbalized understanding and knows not to remove the blue blood bank bracelet.

## 2019-05-31 ENCOUNTER — Other Ambulatory Visit: Payer: Self-pay

## 2019-05-31 ENCOUNTER — Telehealth: Payer: Self-pay | Admitting: Oncology

## 2019-05-31 ENCOUNTER — Inpatient Hospital Stay: Payer: Medicare Other

## 2019-05-31 VITALS — BP 125/61 | HR 91 | Temp 98.2°F | Resp 18

## 2019-05-31 DIAGNOSIS — Z79899 Other long term (current) drug therapy: Secondary | ICD-10-CM | POA: Diagnosis not present

## 2019-05-31 DIAGNOSIS — N189 Chronic kidney disease, unspecified: Secondary | ICD-10-CM

## 2019-05-31 DIAGNOSIS — D631 Anemia in chronic kidney disease: Secondary | ICD-10-CM

## 2019-05-31 DIAGNOSIS — Z7982 Long term (current) use of aspirin: Secondary | ICD-10-CM | POA: Diagnosis not present

## 2019-05-31 DIAGNOSIS — I129 Hypertensive chronic kidney disease with stage 1 through stage 4 chronic kidney disease, or unspecified chronic kidney disease: Secondary | ICD-10-CM | POA: Diagnosis not present

## 2019-05-31 DIAGNOSIS — D649 Anemia, unspecified: Secondary | ICD-10-CM

## 2019-05-31 DIAGNOSIS — D509 Iron deficiency anemia, unspecified: Secondary | ICD-10-CM

## 2019-05-31 MED ORDER — SODIUM CHLORIDE 0.9% IV SOLUTION
250.0000 mL | Freq: Once | INTRAVENOUS | Status: AC
  Administered 2019-05-31: 250 mL via INTRAVENOUS
  Filled 2019-05-31: qty 250

## 2019-05-31 MED ORDER — SODIUM CHLORIDE 0.9 % IV SOLN
Freq: Once | INTRAVENOUS | Status: AC
  Filled 2019-05-31: qty 250

## 2019-05-31 MED ORDER — ACETAMINOPHEN 325 MG PO TABS: 650.0000 mg | ORAL_TABLET | Freq: Once | ORAL | Status: DC

## 2019-05-31 MED ORDER — SODIUM CHLORIDE 0.9 % IV SOLN
510.0000 mg | Freq: Once | INTRAVENOUS | Status: AC
  Administered 2019-05-31: 510 mg via INTRAVENOUS
  Filled 2019-05-31: qty 510

## 2019-05-31 MED ORDER — DIPHENHYDRAMINE HCL 25 MG PO CAPS: 25.0000 mg | ORAL_CAPSULE | Freq: Once | ORAL | Status: DC

## 2019-05-31 NOTE — Telephone Encounter (Signed)
Scheduled appt per 3/24 los.  Spoke with pt daughter and she is aware of the appt dates and time.

## 2019-05-31 NOTE — Patient Instructions (Signed)
Ferumoxytol injection What is this medicine? FERUMOXYTOL is an iron complex. Iron is used to make healthy red blood cells, which carry oxygen and nutrients throughout the body. This medicine is used to treat iron deficiency anemia. This medicine may be used for other purposes; ask your health care provider or pharmacist if you have questions. COMMON BRAND NAME(S): Feraheme What should I tell my health care provider before I take this medicine? They need to know if you have any of these conditions:  anemia not caused by low iron levels  high levels of iron in the blood  magnetic resonance imaging (MRI) test scheduled  an unusual or allergic reaction to iron, other medicines, foods, dyes, or preservatives  pregnant or trying to get pregnant  breast-feeding How should I use this medicine? This medicine is for injection into a vein. It is given by a health care professional in a hospital or clinic setting. Talk to your pediatrician regarding the use of this medicine in children. Special care may be needed. Overdosage: If you think you have taken too much of this medicine contact a poison control center or emergency room at once. NOTE: This medicine is only for you. Do not share this medicine with others. What if I miss a dose? It is important not to miss your dose. Call your doctor or health care professional if you are unable to keep an appointment. What may interact with this medicine? This medicine may interact with the following medications:  other iron products This list may not describe all possible interactions. Give your health care provider a list of all the medicines, herbs, non-prescription drugs, or dietary supplements you use. Also tell them if you smoke, drink alcohol, or use illegal drugs. Some items may interact with your medicine. What should I watch for while using this medicine? Visit your doctor or healthcare professional regularly. Tell your doctor or healthcare  professional if your symptoms do not start to get better or if they get worse. You may need blood work done while you are taking this medicine. You may need to follow a special diet. Talk to your doctor. Foods that contain iron include: whole grains/cereals, dried fruits, beans, or peas, leafy green vegetables, and organ meats (liver, kidney). What side effects may I notice from receiving this medicine? Side effects that you should report to your doctor or health care professional as soon as possible:  allergic reactions like skin rash, itching or hives, swelling of the face, lips, or tongue  breathing problems  changes in blood pressure  feeling faint or lightheaded, falls  fever or chills  flushing, sweating, or hot feelings  swelling of the ankles or feet Side effects that usually do not require medical attention (report to your doctor or health care professional if they continue or are bothersome):  diarrhea  headache  nausea, vomiting  stomach pain This list may not describe all possible side effects. Call your doctor for medical advice about side effects. You may report side effects to FDA at 1-800-FDA-1088. Where should I keep my medicine? This drug is given in a hospital or clinic and will not be stored at home. NOTE: This sheet is a summary. It may not cover all possible information. If you have questions about this medicine, talk to your doctor, pharmacist, or health care provider.  2020 Elsevier/Gold Standard (2016-04-12 20:21:10)   Blood Transfusion, Adult, Care After This sheet gives you information about how to care for yourself after your procedure. Your doctor may   also give you more specific instructions. If you have problems or questions, contact your doctor. What can I expect after the procedure? After the procedure, it is common to have:  Bruising and soreness at the IV site.  A fever or chills on the day of the procedure. This may be your body's response  to the new blood cells received.  A headache. Follow these instructions at home: Insertion site care      Follow instructions from your doctor about how to take care of your insertion site. This is where an IV tube was put into your vein. Make sure you: ? Wash your hands with soap and water before and after you change your bandage (dressing). If you cannot use soap and water, use hand sanitizer. ? Change your bandage as told by your doctor.  Check your insertion site every day for signs of infection. Check for: ? Redness, swelling, or pain. ? Bleeding from the site. ? Warmth. ? Pus or a bad smell. General instructions  Take over-the-counter and prescription medicines only as told by your doctor.  Rest as told by your doctor.  Go back to your normal activities as told by your doctor.  Keep all follow-up visits as told by your doctor. This is important. Contact a doctor if:  You have itching or red, swollen areas of skin (hives).  You feel worried or nervous (anxious).  You feel weak after doing your normal activities.  You have redness, swelling, warmth, or pain around the insertion site.  You have blood coming from the insertion site, and the blood does not stop with pressure.  You have pus or a bad smell coming from the insertion site. Get help right away if:  You have signs of a serious reaction. This may be coming from an allergy or the body's defense system (immune system). Signs include: ? Trouble breathing or shortness of breath. ? Swelling of the face or feeling warm (flushed). ? Fever or chills. ? Head, chest, or back pain. ? Dark pee (urine) or blood in the pee. ? Widespread rash. ? Fast heartbeat. ? Feeling dizzy or light-headed. You may receive your blood transfusion in an outpatient setting. If so, you will be told whom to contact to report any reactions. These symptoms may be an emergency. Do not wait to see if the symptoms will go away. Get medical  help right away. Call your local emergency services (911 in the [place] Do not drive yourself to the hospital. Summary  Bruising and soreness at the IV site are common.  Check your insertion site every day for signs of infection.  Rest as told by your doctor. Go back to your normal activities as told by your doctor.  Get help right away if you have signs of a serious reaction. This information is not intended to replace advice given to you by your health care provider. Make sure you discuss any questions you have with your health care provider. Document Revised: 08/17/2018 Document Reviewed: 08/17/2018 Elsevier Patient Education  2020 Elsevier Inc.  

## 2019-06-01 LAB — TYPE AND SCREEN
ABO/RH(D): A POS
Antibody Screen: NEGATIVE
Unit division: 0

## 2019-06-01 LAB — BPAM RBC
Blood Product Expiration Date: 202104162359
ISSUE DATE / TIME: 202103251500
Unit Type and Rh: 6200

## 2019-06-07 ENCOUNTER — Ambulatory Visit: Payer: Medicare Other

## 2019-06-07 ENCOUNTER — Other Ambulatory Visit: Payer: Self-pay

## 2019-06-07 ENCOUNTER — Inpatient Hospital Stay: Payer: Medicare Other | Attending: Oncology

## 2019-06-07 VITALS — BP 131/51 | HR 94 | Temp 98.6°F | Resp 18

## 2019-06-07 DIAGNOSIS — I129 Hypertensive chronic kidney disease with stage 1 through stage 4 chronic kidney disease, or unspecified chronic kidney disease: Secondary | ICD-10-CM | POA: Insufficient documentation

## 2019-06-07 DIAGNOSIS — N189 Chronic kidney disease, unspecified: Secondary | ICD-10-CM | POA: Diagnosis not present

## 2019-06-07 DIAGNOSIS — D631 Anemia in chronic kidney disease: Secondary | ICD-10-CM | POA: Diagnosis not present

## 2019-06-07 DIAGNOSIS — D649 Anemia, unspecified: Secondary | ICD-10-CM

## 2019-06-07 DIAGNOSIS — D509 Iron deficiency anemia, unspecified: Secondary | ICD-10-CM

## 2019-06-07 DIAGNOSIS — Z79899 Other long term (current) drug therapy: Secondary | ICD-10-CM | POA: Diagnosis not present

## 2019-06-07 MED ORDER — SODIUM CHLORIDE 0.9 % IV SOLN
Freq: Once | INTRAVENOUS | Status: AC
  Filled 2019-06-07: qty 250

## 2019-06-07 MED ORDER — SODIUM CHLORIDE 0.9 % IV SOLN
510.0000 mg | Freq: Once | INTRAVENOUS | Status: AC
  Administered 2019-06-07: 510 mg via INTRAVENOUS
  Filled 2019-06-07: qty 510

## 2019-06-07 NOTE — Patient Instructions (Signed)

## 2019-06-21 ENCOUNTER — Other Ambulatory Visit: Payer: Self-pay

## 2019-06-21 ENCOUNTER — Inpatient Hospital Stay: Payer: Medicare Other

## 2019-06-21 VITALS — BP 132/68 | HR 78 | Temp 98.2°F | Resp 18

## 2019-06-21 DIAGNOSIS — D649 Anemia, unspecified: Secondary | ICD-10-CM

## 2019-06-21 DIAGNOSIS — D509 Iron deficiency anemia, unspecified: Secondary | ICD-10-CM

## 2019-06-21 DIAGNOSIS — D631 Anemia in chronic kidney disease: Secondary | ICD-10-CM | POA: Diagnosis not present

## 2019-06-21 DIAGNOSIS — I129 Hypertensive chronic kidney disease with stage 1 through stage 4 chronic kidney disease, or unspecified chronic kidney disease: Secondary | ICD-10-CM | POA: Diagnosis not present

## 2019-06-21 DIAGNOSIS — N189 Chronic kidney disease, unspecified: Secondary | ICD-10-CM

## 2019-06-21 DIAGNOSIS — Z79899 Other long term (current) drug therapy: Secondary | ICD-10-CM | POA: Diagnosis not present

## 2019-06-21 LAB — CBC WITH DIFFERENTIAL (CANCER CENTER ONLY)
Abs Immature Granulocytes: 0.02 10*3/uL (ref 0.00–0.07)
Basophils Absolute: 0.1 10*3/uL (ref 0.0–0.1)
Basophils Relative: 1 %
Eosinophils Absolute: 0.2 10*3/uL (ref 0.0–0.5)
Eosinophils Relative: 3 %
HCT: 31.5 % — ABNORMAL LOW (ref 36.0–46.0)
Hemoglobin: 9.9 g/dL — ABNORMAL LOW (ref 12.0–15.0)
Immature Granulocytes: 0 %
Lymphocytes Relative: 7 %
Lymphs Abs: 0.5 10*3/uL — ABNORMAL LOW (ref 0.7–4.0)
MCH: 30.7 pg (ref 26.0–34.0)
MCHC: 31.4 g/dL (ref 30.0–36.0)
MCV: 97.5 fL (ref 80.0–100.0)
Monocytes Absolute: 0.7 10*3/uL (ref 0.1–1.0)
Monocytes Relative: 9 %
Neutro Abs: 5.9 10*3/uL (ref 1.7–7.7)
Neutrophils Relative %: 80 %
Platelet Count: 235 10*3/uL (ref 150–400)
RBC: 3.23 MIL/uL — ABNORMAL LOW (ref 3.87–5.11)
RDW: 19.7 % — ABNORMAL HIGH (ref 11.5–15.5)
WBC Count: 7.3 10*3/uL (ref 4.0–10.5)
nRBC: 0 % (ref 0.0–0.2)

## 2019-06-21 LAB — IRON AND TIBC
Iron: 54 ug/dL (ref 41–142)
Saturation Ratios: 29 % (ref 21–57)
TIBC: 189 ug/dL — ABNORMAL LOW (ref 236–444)
UIBC: 135 ug/dL (ref 120–384)

## 2019-06-21 LAB — SAMPLE TO BLOOD BANK

## 2019-06-21 LAB — FERRITIN: Ferritin: 294 ng/mL (ref 11–307)

## 2019-06-21 MED ORDER — DARBEPOETIN ALFA 300 MCG/0.6ML IJ SOSY
300.0000 ug | PREFILLED_SYRINGE | Freq: Once | INTRAMUSCULAR | Status: AC
  Administered 2019-06-21: 300 ug via SUBCUTANEOUS

## 2019-06-21 MED ORDER — DARBEPOETIN ALFA 300 MCG/0.6ML IJ SOSY
PREFILLED_SYRINGE | INTRAMUSCULAR | Status: AC
  Filled 2019-06-21: qty 0.6

## 2019-06-21 NOTE — Patient Instructions (Signed)
Darbepoetin Alfa injection What is this medicine? DARBEPOETIN ALFA (dar be POE e tin AL fa) helps your body make more red blood cells. It is used to treat anemia caused by chronic kidney failure and chemotherapy. This medicine may be used for other purposes; ask your health care provider or pharmacist if you have questions. COMMON BRAND NAME(S): Aranesp What should I tell my health care provider before I take this medicine? They need to know if you have any of these conditions:  blood clotting disorders or history of blood clots  cancer patient not on chemotherapy  cystic fibrosis  heart disease, such as angina, heart failure, or a history of a heart attack  hemoglobin level of 12 g/dL or greater  high blood pressure  low levels of folate, iron, or vitamin B12  seizures  an unusual or allergic reaction to darbepoetin, erythropoietin, albumin, hamster proteins, latex, other medicines, foods, dyes, or preservatives  pregnant or trying to get pregnant  breast-feeding How should I use this medicine? This medicine is for injection into a vein or under the skin. It is usually given by a health care professional in a hospital or clinic setting. If you get this medicine at home, you will be taught how to prepare and give this medicine. Use exactly as directed. Take your medicine at regular intervals. Do not take your medicine more often than directed. It is important that you put your used needles and syringes in a special sharps container. Do not put them in a trash can. If you do not have a sharps container, call your pharmacist or healthcare provider to get one. A special MedGuide will be given to you by the pharmacist with each prescription and refill. Be sure to read this information carefully each time. Talk to your pediatrician regarding the use of this medicine in children. While this medicine may be used in children as young as 1 month of age for selected conditions, precautions do  apply. Overdosage: If you think you have taken too much of this medicine contact a poison control center or emergency room at once. NOTE: This medicine is only for you. Do not share this medicine with others. What if I miss a dose? If you miss a dose, take it as soon as you can. If it is almost time for your next dose, take only that dose. Do not take double or extra doses. What may interact with this medicine? Do not take this medicine with any of the following medications:  epoetin alfa This list may not describe all possible interactions. Give your health care provider a list of all the medicines, herbs, non-prescription drugs, or dietary supplements you use. Also tell them if you smoke, drink alcohol, or use illegal drugs. Some items may interact with your medicine. What should I watch for while using this medicine? Your condition will be monitored carefully while you are receiving this medicine. You may need blood work done while you are taking this medicine. This medicine may cause a decrease in vitamin B6. You should make sure that you get enough vitamin B6 while you are taking this medicine. Discuss the foods you eat and the vitamins you take with your health care professional. What side effects may I notice from receiving this medicine? Side effects that you should report to your doctor or health care professional as soon as possible:  allergic reactions like skin rash, itching or hives, swelling of the face, lips, or tongue  breathing problems  changes in   vision  chest pain  confusion, trouble speaking or understanding  feeling faint or lightheaded, falls  high blood pressure  muscle aches or pains  pain, swelling, warmth in the leg  rapid weight gain  severe headaches  sudden numbness or weakness of the face, arm or leg  trouble walking, dizziness, loss of balance or coordination  seizures (convulsions)  swelling of the ankles, feet, hands  unusually weak or  tired Side effects that usually do not require medical attention (report to your doctor or health care professional if they continue or are bothersome):  diarrhea  fever, chills (flu-like symptoms)  headaches  nausea, vomiting  redness, stinging, or swelling at site where injected This list may not describe all possible side effects. Call your doctor for medical advice about side effects. You may report side effects to FDA at 1-800-FDA-1088. Where should I keep my medicine? Keep out of the reach of children. Store in a refrigerator between 2 and 8 degrees C (36 and 46 degrees F). Do not freeze. Do not shake. Throw away any unused portion if using a single-dose vial. Throw away any unused medicine after the expiration date. NOTE: This sheet is a summary. It may not cover all possible information. If you have questions about this medicine, talk to your doctor, pharmacist, or health care provider.  2020 Elsevier/Gold Standard (2017-03-09 16:44:20)  

## 2019-07-11 DIAGNOSIS — L308 Other specified dermatitis: Secondary | ICD-10-CM | POA: Diagnosis not present

## 2019-07-11 DIAGNOSIS — Z85828 Personal history of other malignant neoplasm of skin: Secondary | ICD-10-CM | POA: Diagnosis not present

## 2019-07-11 DIAGNOSIS — L570 Actinic keratosis: Secondary | ICD-10-CM | POA: Diagnosis not present

## 2019-07-11 DIAGNOSIS — L309 Dermatitis, unspecified: Secondary | ICD-10-CM | POA: Diagnosis not present

## 2019-07-12 ENCOUNTER — Inpatient Hospital Stay: Payer: Medicare Other | Attending: Oncology

## 2019-07-12 ENCOUNTER — Other Ambulatory Visit: Payer: Self-pay

## 2019-07-12 ENCOUNTER — Inpatient Hospital Stay: Payer: Medicare Other

## 2019-07-12 VITALS — BP 131/68 | HR 79 | Temp 98.9°F | Resp 20

## 2019-07-12 DIAGNOSIS — N189 Chronic kidney disease, unspecified: Secondary | ICD-10-CM | POA: Diagnosis not present

## 2019-07-12 DIAGNOSIS — D509 Iron deficiency anemia, unspecified: Secondary | ICD-10-CM | POA: Insufficient documentation

## 2019-07-12 DIAGNOSIS — D631 Anemia in chronic kidney disease: Secondary | ICD-10-CM

## 2019-07-12 DIAGNOSIS — I129 Hypertensive chronic kidney disease with stage 1 through stage 4 chronic kidney disease, or unspecified chronic kidney disease: Secondary | ICD-10-CM | POA: Insufficient documentation

## 2019-07-12 DIAGNOSIS — Z7982 Long term (current) use of aspirin: Secondary | ICD-10-CM | POA: Diagnosis not present

## 2019-07-12 DIAGNOSIS — Z79899 Other long term (current) drug therapy: Secondary | ICD-10-CM | POA: Diagnosis not present

## 2019-07-12 DIAGNOSIS — D649 Anemia, unspecified: Secondary | ICD-10-CM

## 2019-07-12 LAB — CBC WITH DIFFERENTIAL (CANCER CENTER ONLY)
Abs Immature Granulocytes: 0.02 10*3/uL (ref 0.00–0.07)
Basophils Absolute: 0 10*3/uL (ref 0.0–0.1)
Basophils Relative: 1 %
Eosinophils Absolute: 0.1 10*3/uL (ref 0.0–0.5)
Eosinophils Relative: 2 %
HCT: 30.3 % — ABNORMAL LOW (ref 36.0–46.0)
Hemoglobin: 9.5 g/dL — ABNORMAL LOW (ref 12.0–15.0)
Immature Granulocytes: 0 %
Lymphocytes Relative: 6 %
Lymphs Abs: 0.4 10*3/uL — ABNORMAL LOW (ref 0.7–4.0)
MCH: 31 pg (ref 26.0–34.0)
MCHC: 31.4 g/dL (ref 30.0–36.0)
MCV: 99 fL (ref 80.0–100.0)
Monocytes Absolute: 0.8 10*3/uL (ref 0.1–1.0)
Monocytes Relative: 11 %
Neutro Abs: 5.5 10*3/uL (ref 1.7–7.7)
Neutrophils Relative %: 80 %
Platelet Count: 255 10*3/uL (ref 150–400)
RBC: 3.06 MIL/uL — ABNORMAL LOW (ref 3.87–5.11)
RDW: 18.5 % — ABNORMAL HIGH (ref 11.5–15.5)
WBC Count: 7 10*3/uL (ref 4.0–10.5)
nRBC: 0 % (ref 0.0–0.2)

## 2019-07-12 LAB — SAMPLE TO BLOOD BANK

## 2019-07-12 LAB — IRON AND TIBC
Iron: 37 ug/dL — ABNORMAL LOW (ref 41–142)
Saturation Ratios: 17 % — ABNORMAL LOW (ref 21–57)
TIBC: 215 ug/dL — ABNORMAL LOW (ref 236–444)
UIBC: 178 ug/dL (ref 120–384)

## 2019-07-12 LAB — FERRITIN: Ferritin: 49 ng/mL (ref 11–307)

## 2019-07-12 MED ORDER — DARBEPOETIN ALFA 300 MCG/0.6ML IJ SOSY
300.0000 ug | PREFILLED_SYRINGE | Freq: Once | INTRAMUSCULAR | Status: AC
  Administered 2019-07-12: 300 ug via SUBCUTANEOUS

## 2019-07-12 MED ORDER — DARBEPOETIN ALFA 300 MCG/0.6ML IJ SOSY
PREFILLED_SYRINGE | INTRAMUSCULAR | Status: AC
  Filled 2019-07-12: qty 0.6

## 2019-07-12 NOTE — Patient Instructions (Signed)
Darbepoetin Alfa injection What is this medicine? DARBEPOETIN ALFA (dar be POE e tin AL fa) helps your body make more red blood cells. It is used to treat anemia caused by chronic kidney failure and chemotherapy. This medicine may be used for other purposes; ask your health care provider or pharmacist if you have questions. COMMON BRAND NAME(S): Aranesp What should I tell my health care provider before I take this medicine? They need to know if you have any of these conditions:  blood clotting disorders or history of blood clots  cancer patient not on chemotherapy  cystic fibrosis  heart disease, such as angina, heart failure, or a history of a heart attack  hemoglobin level of 12 g/dL or greater  high blood pressure  low levels of folate, iron, or vitamin B12  seizures  an unusual or allergic reaction to darbepoetin, erythropoietin, albumin, hamster proteins, latex, other medicines, foods, dyes, or preservatives  pregnant or trying to get pregnant  breast-feeding How should I use this medicine? This medicine is for injection into a vein or under the skin. It is usually given by a health care professional in a hospital or clinic setting. If you get this medicine at home, you will be taught how to prepare and give this medicine. Use exactly as directed. Take your medicine at regular intervals. Do not take your medicine more often than directed. It is important that you put your used needles and syringes in a special sharps container. Do not put them in a trash can. If you do not have a sharps container, call your pharmacist or healthcare provider to get one. A special MedGuide will be given to you by the pharmacist with each prescription and refill. Be sure to read this information carefully each time. Talk to your pediatrician regarding the use of this medicine in children. While this medicine may be used in children as young as 1 month of age for selected conditions, precautions do  apply. Overdosage: If you think you have taken too much of this medicine contact a poison control center or emergency room at once. NOTE: This medicine is only for you. Do not share this medicine with others. What if I miss a dose? If you miss a dose, take it as soon as you can. If it is almost time for your next dose, take only that dose. Do not take double or extra doses. What may interact with this medicine? Do not take this medicine with any of the following medications:  epoetin alfa This list may not describe all possible interactions. Give your health care provider a list of all the medicines, herbs, non-prescription drugs, or dietary supplements you use. Also tell them if you smoke, drink alcohol, or use illegal drugs. Some items may interact with your medicine. What should I watch for while using this medicine? Your condition will be monitored carefully while you are receiving this medicine. You may need blood work done while you are taking this medicine. This medicine may cause a decrease in vitamin B6. You should make sure that you get enough vitamin B6 while you are taking this medicine. Discuss the foods you eat and the vitamins you take with your health care professional. What side effects may I notice from receiving this medicine? Side effects that you should report to your doctor or health care professional as soon as possible:  allergic reactions like skin rash, itching or hives, swelling of the face, lips, or tongue  breathing problems  changes in   vision  chest pain  confusion, trouble speaking or understanding  feeling faint or lightheaded, falls  high blood pressure  muscle aches or pains  pain, swelling, warmth in the leg  rapid weight gain  severe headaches  sudden numbness or weakness of the face, arm or leg  trouble walking, dizziness, loss of balance or coordination  seizures (convulsions)  swelling of the ankles, feet, hands  unusually weak or  tired Side effects that usually do not require medical attention (report to your doctor or health care professional if they continue or are bothersome):  diarrhea  fever, chills (flu-like symptoms)  headaches  nausea, vomiting  redness, stinging, or swelling at site where injected This list may not describe all possible side effects. Call your doctor for medical advice about side effects. You may report side effects to FDA at 1-800-FDA-1088. Where should I keep my medicine? Keep out of the reach of children. Store in a refrigerator between 2 and 8 degrees C (36 and 46 degrees F). Do not freeze. Do not shake. Throw away any unused portion if using a single-dose vial. Throw away any unused medicine after the expiration date. NOTE: This sheet is a summary. It may not cover all possible information. If you have questions about this medicine, talk to your doctor, pharmacist, or health care provider.  2020 Elsevier/Gold Standard (2017-03-09 16:44:20)  

## 2019-08-01 ENCOUNTER — Inpatient Hospital Stay (HOSPITAL_BASED_OUTPATIENT_CLINIC_OR_DEPARTMENT_OTHER): Payer: Medicare Other | Admitting: Oncology

## 2019-08-01 ENCOUNTER — Other Ambulatory Visit: Payer: Self-pay

## 2019-08-01 ENCOUNTER — Inpatient Hospital Stay: Payer: Medicare Other

## 2019-08-01 VITALS — BP 158/72 | HR 106 | Temp 97.8°F | Resp 20 | Ht 61.0 in | Wt 141.6 lb

## 2019-08-01 DIAGNOSIS — N189 Chronic kidney disease, unspecified: Secondary | ICD-10-CM

## 2019-08-01 DIAGNOSIS — D509 Iron deficiency anemia, unspecified: Secondary | ICD-10-CM

## 2019-08-01 DIAGNOSIS — D649 Anemia, unspecified: Secondary | ICD-10-CM

## 2019-08-01 DIAGNOSIS — D631 Anemia in chronic kidney disease: Secondary | ICD-10-CM

## 2019-08-01 DIAGNOSIS — Z7982 Long term (current) use of aspirin: Secondary | ICD-10-CM | POA: Diagnosis not present

## 2019-08-01 DIAGNOSIS — Z79899 Other long term (current) drug therapy: Secondary | ICD-10-CM | POA: Diagnosis not present

## 2019-08-01 DIAGNOSIS — I129 Hypertensive chronic kidney disease with stage 1 through stage 4 chronic kidney disease, or unspecified chronic kidney disease: Secondary | ICD-10-CM | POA: Diagnosis not present

## 2019-08-01 LAB — CBC WITH DIFFERENTIAL (CANCER CENTER ONLY)
Abs Immature Granulocytes: 0.03 10*3/uL (ref 0.00–0.07)
Basophils Absolute: 0.1 10*3/uL (ref 0.0–0.1)
Basophils Relative: 1 %
Eosinophils Absolute: 0.1 10*3/uL (ref 0.0–0.5)
Eosinophils Relative: 1 %
HCT: 29.2 % — ABNORMAL LOW (ref 36.0–46.0)
Hemoglobin: 8.9 g/dL — ABNORMAL LOW (ref 12.0–15.0)
Immature Granulocytes: 0 %
Lymphocytes Relative: 7 %
Lymphs Abs: 0.5 10*3/uL — ABNORMAL LOW (ref 0.7–4.0)
MCH: 28.8 pg (ref 26.0–34.0)
MCHC: 30.5 g/dL (ref 30.0–36.0)
MCV: 94.5 fL (ref 80.0–100.0)
Monocytes Absolute: 0.8 10*3/uL (ref 0.1–1.0)
Monocytes Relative: 11 %
Neutro Abs: 5.8 10*3/uL (ref 1.7–7.7)
Neutrophils Relative %: 80 %
Platelet Count: 299 10*3/uL (ref 150–400)
RBC: 3.09 MIL/uL — ABNORMAL LOW (ref 3.87–5.11)
RDW: 17.6 % — ABNORMAL HIGH (ref 11.5–15.5)
WBC Count: 7.3 10*3/uL (ref 4.0–10.5)
nRBC: 0 % (ref 0.0–0.2)

## 2019-08-01 LAB — IRON AND TIBC
Iron: 16 ug/dL — ABNORMAL LOW (ref 41–142)
Saturation Ratios: 6 % — ABNORMAL LOW (ref 21–57)
TIBC: 260 ug/dL (ref 236–444)
UIBC: 244 ug/dL (ref 120–384)

## 2019-08-01 LAB — SAMPLE TO BLOOD BANK

## 2019-08-01 LAB — FERRITIN: Ferritin: 16 ng/mL (ref 11–307)

## 2019-08-01 MED ORDER — DARBEPOETIN ALFA 300 MCG/0.6ML IJ SOSY
PREFILLED_SYRINGE | INTRAMUSCULAR | Status: AC
  Filled 2019-08-01: qty 0.6

## 2019-08-01 MED ORDER — DARBEPOETIN ALFA 300 MCG/0.6ML IJ SOSY
300.0000 ug | PREFILLED_SYRINGE | Freq: Once | INTRAMUSCULAR | Status: AC
  Administered 2019-08-01: 300 ug via SUBCUTANEOUS

## 2019-08-01 NOTE — Patient Instructions (Signed)
Darbepoetin Alfa injection What is this medicine? DARBEPOETIN ALFA (dar be POE e tin AL fa) helps your body make more red blood cells. It is used to treat anemia caused by chronic kidney failure and chemotherapy. This medicine may be used for other purposes; ask your health care provider or pharmacist if you have questions. COMMON BRAND NAME(S): Aranesp What should I tell my health care provider before I take this medicine? They need to know if you have any of these conditions:  blood clotting disorders or history of blood clots  cancer patient not on chemotherapy  cystic fibrosis  heart disease, such as angina, heart failure, or a history of a heart attack  hemoglobin level of 12 g/dL or greater  high blood pressure  low levels of folate, iron, or vitamin B12  seizures  an unusual or allergic reaction to darbepoetin, erythropoietin, albumin, hamster proteins, latex, other medicines, foods, dyes, or preservatives  pregnant or trying to get pregnant  breast-feeding How should I use this medicine? This medicine is for injection into a vein or under the skin. It is usually given by a health care professional in a hospital or clinic setting. If you get this medicine at home, you will be taught how to prepare and give this medicine. Use exactly as directed. Take your medicine at regular intervals. Do not take your medicine more often than directed. It is important that you put your used needles and syringes in a special sharps container. Do not put them in a trash can. If you do not have a sharps container, call your pharmacist or healthcare provider to get one. A special MedGuide will be given to you by the pharmacist with each prescription and refill. Be sure to read this information carefully each time. Talk to your pediatrician regarding the use of this medicine in children. While this medicine may be used in children as young as 1 month of age for selected conditions, precautions do  apply. Overdosage: If you think you have taken too much of this medicine contact a poison control center or emergency room at once. NOTE: This medicine is only for you. Do not share this medicine with others. What if I miss a dose? If you miss a dose, take it as soon as you can. If it is almost time for your next dose, take only that dose. Do not take double or extra doses. What may interact with this medicine? Do not take this medicine with any of the following medications:  epoetin alfa This list may not describe all possible interactions. Give your health care provider a list of all the medicines, herbs, non-prescription drugs, or dietary supplements you use. Also tell them if you smoke, drink alcohol, or use illegal drugs. Some items may interact with your medicine. What should I watch for while using this medicine? Your condition will be monitored carefully while you are receiving this medicine. You may need blood work done while you are taking this medicine. This medicine may cause a decrease in vitamin B6. You should make sure that you get enough vitamin B6 while you are taking this medicine. Discuss the foods you eat and the vitamins you take with your health care professional. What side effects may I notice from receiving this medicine? Side effects that you should report to your doctor or health care professional as soon as possible:  allergic reactions like skin rash, itching or hives, swelling of the face, lips, or tongue  breathing problems  changes in   vision  chest pain  confusion, trouble speaking or understanding  feeling faint or lightheaded, falls  high blood pressure  muscle aches or pains  pain, swelling, warmth in the leg  rapid weight gain  severe headaches  sudden numbness or weakness of the face, arm or leg  trouble walking, dizziness, loss of balance or coordination  seizures (convulsions)  swelling of the ankles, feet, hands  unusually weak or  tired Side effects that usually do not require medical attention (report to your doctor or health care professional if they continue or are bothersome):  diarrhea  fever, chills (flu-like symptoms)  headaches  nausea, vomiting  redness, stinging, or swelling at site where injected This list may not describe all possible side effects. Call your doctor for medical advice about side effects. You may report side effects to FDA at 1-800-FDA-1088. Where should I keep my medicine? Keep out of the reach of children. Store in a refrigerator between 2 and 8 degrees C (36 and 46 degrees F). Do not freeze. Do not shake. Throw away any unused portion if using a single-dose vial. Throw away any unused medicine after the expiration date. NOTE: This sheet is a summary. It may not cover all possible information. If you have questions about this medicine, talk to your doctor, pharmacist, or health care provider.  2020 Elsevier/Gold Standard (2017-03-09 16:44:20)  

## 2019-08-01 NOTE — Progress Notes (Signed)
Hematology and Oncology Follow Up Visit  Lisa [name] [id number] Sep 28, 1923 86 y.o. 08/01/2019 1:20 PM Lisa [name], MDBarnes, Lisa Manis, MD   Principle Diagnosis: 86 year old woman with multifactorial anemia related to renal insufficiency and iron deficiency diagnosed in 2018.    Prior Therapy: IV iron infusion infusion as well as packed red cell transfusion.  Current therapy:   Aranesp 300 mcg every 3 weeks   More Feraheme at 1000 mg every 6 to 9 weeks.  Supportive packed red cell transfusion for hemoglobin below 8.    Interim History: [name] presents today for a routine evaluation.  Since her last visit, she reports no major changes in her health.  She denies any recent hospitalizations or illnesses.  He denies any chest pain or difficulty breathing.  She denies any excessive weakness or fatigue.  Formal status and quality of life although limited is unchanged.  He does ambulate with the help of a walker without any falls or syncope.                 Medications: Reviewed without changes. Current Outpatient Medications  Medication Sig Dispense Refill  . acetaminophen (TYLENOL) 500 MG tablet Take 500-1,000 mg by mouth at bedtime.    Marland Kitchen aspirin EC 81 MG tablet Take 81 mg by mouth daily.    . calcium carbonate (OS-CAL) 600 MG TABS Take 600 mg by mouth daily.     Marland Kitchen loratadine (CLARITIN) 10 MG tablet Take 10 mg by mouth daily as needed.     . Multiple Vitamins-Minerals (MULTIVITAMIN WITH MINERALS) tablet Take 1 tablet by mouth daily.    Marland Kitchen OVER THE COUNTER MEDICATION Apply topically as needed. Cream for arthritic pain. Patient unable to recall the name at this time.    Marland Kitchen oxybutynin (DITROPAN) 5 MG tablet Take 2.5 mg by mouth 2 (two) times daily.  2  . pramipexole (MIRAPEX) 0.25 MG tablet Take 0.25 mg by mouth at bedtime.    . primidone (MYSOLINE) 50 MG tablet Take 3 tablets (150 mg total) by mouth at bedtime. 270 tablet 3  . raloxifene (EVISTA) 60 MG tablet Take  60 mg by mouth at bedtime.     . timolol (TIMOPTIC) 0.5 % ophthalmic solution Place 1 drop into both eyes daily.   1  . UNABLE TO FIND Med Name: Aranesp infusions every 3 weeks administered by [facility]    . VYTORIN 10-20 MG tablet Take 1 tablet by mouth daily.  0   No current facility-administered medications for this visit.     Allergies:  Allergies  Allergen Reactions  . Mycostatin [Nystatin] Swelling and Rash      Physical Exam:     Blood pressure (!) 158/72, pulse (!) 106, temperature 97.8 F (36.6 C), temperature source Temporal, resp. rate 20, height 5\' 1"  (1.549 m), weight 141 lb 9.6 oz (64.2 kg), SpO2 98 %.       ECOG: 1   General appearance: Alert, awake without any distress. Head: Atraumatic without abnormalities Oropharynx: Without any thrush or ulcers. Eyes: No scleral icterus. Lymph nodes: No lymphadenopathy noted in the cervical, supraclavicular, or axillary nodes Heart:regular rate and rhythm, without any murmurs or gallops.   Lung: Clear to auscultation without any rhonchi, wheezes or dullness to percussion. Abdomin: Soft, nontender without any shifting dullness or ascites. Musculoskeletal: No clubbing or cyanosis. Neurological: No motor or sensory deficits. Skin: No rashes or lesions.  .           Lab  Results: Lab Results  Component Value Date   WBC 7.3 08/01/2019   HGB 8.9 (L) 08/01/2019   HCT 29.2 (L) 08/01/2019   MCV 94.5 08/01/2019   PLT 299 08/01/2019     Chemistry      Component Value Date/Time   NA 140 05/10/2019 1437   NA 135 (A) 04/01/2016 0000   K 4.7 05/10/2019 1437   CL 104 05/10/2019 1437   CO2 29 05/10/2019 1437   BUN 21 05/10/2019 1437   BUN 19 04/01/2016 0000   CREATININE 0.84 05/10/2019 1437   GLU 102 04/01/2016 0000      Component Value Date/Time   CALCIUM 8.6 (L) 05/10/2019 1437   ALKPHOS 75 05/10/2019 1437   AST 18 05/10/2019 1437   ALT 16 05/10/2019 1437   BILITOT <0.2 (L)  05/10/2019 1437       Impression and Plan:  86 year old woman with:   1.  Anemia related to chronically insufficiency as well as iron deficiency since 2018.   She is currently on Aranesp which she continues to tolerate reasonably well which has maintained her hemoglobin better with a decrease in the frequency of transfusion.  Risks and benefits of continuing Aranesp at this time were discussed.  Potential complications including hypertension and venous thromboembolism were updated.  The plan is to continue with his treatment every 3 weeks.  2.  Iron deficiency: She is status post intravenous iron infusion periodically.  Iron studies obtained on Jul 12, 2019 were personally reviewed and showed a declining iron level currently at 37 with saturation of 17%.  Risks and benefits of retreatment with intravenous iron were reviewed.  Potential complications that includes infusion related issues such as arthralgias, myalgias and rarely anaphylaxis.  He is agreeable to proceed and will receive IV iron on Aug 02, 2019 and repeat in 1 week.      3.  Follow-up: She will continue to follow every 3 weeks for Aranesp and have MD follow-up in 9 weeks.   30 minutes were spent on this visit.  Time was spent on updating her disease status, reviewing laboratory data, treatment options and outlining future plan of care.     Zola Button, MD 5/26/20211:20 PM

## 2019-08-02 ENCOUNTER — Other Ambulatory Visit: Payer: Self-pay

## 2019-08-02 ENCOUNTER — Inpatient Hospital Stay: Payer: Medicare Other

## 2019-08-02 VITALS — BP 116/55 | HR 96 | Temp 98.7°F | Resp 18

## 2019-08-02 DIAGNOSIS — N189 Chronic kidney disease, unspecified: Secondary | ICD-10-CM | POA: Diagnosis not present

## 2019-08-02 DIAGNOSIS — Z79899 Other long term (current) drug therapy: Secondary | ICD-10-CM | POA: Diagnosis not present

## 2019-08-02 DIAGNOSIS — I129 Hypertensive chronic kidney disease with stage 1 through stage 4 chronic kidney disease, or unspecified chronic kidney disease: Secondary | ICD-10-CM | POA: Diagnosis not present

## 2019-08-02 DIAGNOSIS — D631 Anemia in chronic kidney disease: Secondary | ICD-10-CM

## 2019-08-02 DIAGNOSIS — D509 Iron deficiency anemia, unspecified: Secondary | ICD-10-CM

## 2019-08-02 DIAGNOSIS — Z7982 Long term (current) use of aspirin: Secondary | ICD-10-CM | POA: Diagnosis not present

## 2019-08-02 DIAGNOSIS — D649 Anemia, unspecified: Secondary | ICD-10-CM

## 2019-08-02 MED ORDER — SODIUM CHLORIDE 0.9 % IV SOLN
510.0000 mg | Freq: Once | INTRAVENOUS | Status: AC
  Administered 2019-08-02: 510 mg via INTRAVENOUS
  Filled 2019-08-02: qty 510

## 2019-08-02 MED ORDER — SODIUM CHLORIDE 0.9 % IV SOLN
Freq: Once | INTRAVENOUS | Status: AC
  Filled 2019-08-02: qty 250

## 2019-08-02 NOTE — Patient Instructions (Signed)

## 2019-08-03 ENCOUNTER — Telehealth: Payer: Self-pay | Admitting: Oncology

## 2019-08-03 NOTE — Telephone Encounter (Signed)
Scheduled appt per 5/26 los.  Spoke with pt daughter and she is going to check my-chart for the appt dates and time, and if something needs to be changed, she stated she will call me on 6/1.

## 2019-08-08 ENCOUNTER — Inpatient Hospital Stay: Payer: Medicare Other | Attending: Oncology

## 2019-08-08 ENCOUNTER — Other Ambulatory Visit: Payer: Self-pay

## 2019-08-08 VITALS — BP 128/61 | HR 97 | Temp 99.1°F | Resp 20

## 2019-08-08 DIAGNOSIS — Z79899 Other long term (current) drug therapy: Secondary | ICD-10-CM | POA: Diagnosis not present

## 2019-08-08 DIAGNOSIS — D509 Iron deficiency anemia, unspecified: Secondary | ICD-10-CM

## 2019-08-08 DIAGNOSIS — D631 Anemia in chronic kidney disease: Secondary | ICD-10-CM | POA: Insufficient documentation

## 2019-08-08 DIAGNOSIS — N189 Chronic kidney disease, unspecified: Secondary | ICD-10-CM | POA: Diagnosis not present

## 2019-08-08 DIAGNOSIS — I129 Hypertensive chronic kidney disease with stage 1 through stage 4 chronic kidney disease, or unspecified chronic kidney disease: Secondary | ICD-10-CM | POA: Insufficient documentation

## 2019-08-08 DIAGNOSIS — D649 Anemia, unspecified: Secondary | ICD-10-CM

## 2019-08-08 MED ORDER — SODIUM CHLORIDE 0.9 % IV SOLN
510.0000 mg | Freq: Once | INTRAVENOUS | Status: AC
  Administered 2019-08-08: 510 mg via INTRAVENOUS
  Filled 2019-08-08: qty 510

## 2019-08-08 MED ORDER — SODIUM CHLORIDE 0.9 % IV SOLN
Freq: Once | INTRAVENOUS | Status: AC
  Filled 2019-08-08: qty 250

## 2019-08-08 NOTE — Patient Instructions (Signed)

## 2019-08-13 ENCOUNTER — Telehealth: Payer: Self-pay | Admitting: *Deleted

## 2019-08-13 NOTE — Telephone Encounter (Signed)
Pt's daughter Thayer [place] (on [place]) called. Pt was placed on low dose Pramipexole QHS by PCP in the past for RLS. Pt's daughter felt the patient may need an increase in dose so she called PCP and asked for BID dosing. She stated she was told to call neurology. However the pt does not see our office for RLS. She asked for advice. I let her know since PCP placed pt on this they would need to manage however I would look into this and give her a call back. She verbalized appreciation.

## 2019-08-14 ENCOUNTER — Other Ambulatory Visit: Payer: Self-pay

## 2019-08-14 ENCOUNTER — Ambulatory Visit (INDEPENDENT_AMBULATORY_CARE_PROVIDER_SITE_OTHER): Payer: Medicare Other | Admitting: Otolaryngology

## 2019-08-14 VITALS — Temp 97.9°F

## 2019-08-14 DIAGNOSIS — H6123 Impacted cerumen, bilateral: Secondary | ICD-10-CM | POA: Diagnosis not present

## 2019-08-14 NOTE — Progress Notes (Signed)
HPI: Lisa [name] is a 86 y.o. female who presents for evaluation of wax buildup in both ears obstructing her hearing aids..  Past Medical History:  Diagnosis Date  . Anemia    sees Dr. Clelia Croft @ [facility]  . Arthritis   . Carotid artery occlusion   . Cataract   . Glaucoma   . High cholesterol   . Hypertension   . Osteoporosis   . Pneumonia July 03, 2014  . Syncope 03/15/2016  . UTI (urinary tract infection)    Past Surgical History:  Procedure Laterality Date  . ABDOMINAL HYSTERECTOMY  1969  . APPENDECTOMY    . EYE SURGERY     Social History   Socioeconomic History  . Marital status: Widowed    Spouse name: Not on file  . Number of children: 3  . Years of education: 57  . Highest education level: Bachelor's degree (e.g., BA, AB, BS)  Occupational History  . Occupation: retired Child psychotherapist  Tobacco Use  . Smoking status: Never Smoker  . Smokeless tobacco: Never Used  Substance and Sexual Activity  . Alcohol use: Not Currently    Alcohol/week: 0.0 standard drinks    Comment: rare; about 3 sips every 2-3 months   . Drug use: No  . Sexual activity: Never  Other Topics Concern  . Not on file  Social History Narrative   Admitted to [facility] skill unit 03/10/16   Currently lives at [facility] independent living    Widowed   Never smoked   Living Will, POA   Social Determinants of Health   Financial Resource Strain:   . Difficulty of Paying Living Expenses:   Food Insecurity:   . Worried About Programme researcher, broadcasting/film/video in the Last Year:   . Barista in the Last Year:   Transportation Needs:   . Freight forwarder (Medical):   Marland Kitchen Lack of Transportation (Non-Medical):   Physical Activity:   . Days of Exercise per Week:   . Minutes of Exercise per Session:   Stress:   . Feeling of Stress :   Social Connections:   . Frequency of Communication with Friends and Family:   . Frequency of Social Gatherings with Friends  and Family:   . Attends Religious Services:   . Active Member of Clubs or Organizations:   . Attends Banker Meetings:   Marland Kitchen Marital Status:    Family History  Problem Relation Age of Onset  . Hypertension Mother   . Heart disease Mother   . Hyperlipidemia Brother   . Hypertension Brother    Allergies  Allergen Reactions  . Mycostatin [Nystatin] Swelling and Rash   Prior to Admission medications   Medication Sig Start Date End Date Taking? Authorizing Provider  acetaminophen (TYLENOL) 500 MG tablet Take 500-1,000 mg by mouth at bedtime.   Yes [provider]  aspirin EC 81 MG tablet Take 81 mg by mouth daily.   Yes [provider]  calcium carbonate (OS-CAL) 600 MG TABS Take 600 mg by mouth daily.    Yes [provider]  hydrocortisone 2.5 % ointment  07/11/19  Yes [provider]  loratadine (CLARITIN) 10 MG tablet Take 10 mg by mouth daily as needed.    Yes [provider]  Multiple Vitamins-Minerals (MULTIVITAMIN WITH MINERALS) tablet Take 1 tablet by mouth daily.   Yes [provider]  mupirocin ointment (BACTROBAN) 2 % APPLY TO  AFFECTED AREA EVERY DAY 04/13/19  Yes [provider]  OVER THE COUNTER MEDICATION Apply topically as needed. Cream for arthritic pain. Patient unable to recall the name at this time.   Yes [provider]  oxybutynin (DITROPAN) 5 MG tablet Take 2.5 mg by mouth 2 (two) times daily. 09/19/17  Yes [provider]  pramipexole (MIRAPEX) 0.25 MG tablet Take 0.25 mg by mouth at bedtime.   Yes [provider]  primidone (MYSOLINE) 50 MG tablet Take 3 tablets (150 mg total) by mouth at bedtime. 11/20/18  Yes Melvenia Beam, MD  raloxifene (EVISTA) 60 MG tablet Take 60 mg by mouth at bedtime.    Yes [provider]  timolol (TIMOPTIC) 0.5 % ophthalmic solution Place 1 drop into both eyes daily.  02/25/15  Yes [provider]  Chelsea Name:  Aranesp infusions every 3 weeks administered by [facility]   Yes [provider]  VYTORIN 10-20 MG tablet Take 1 tablet by mouth daily. 03/11/15  Yes [provider]     Positive ROS: Otherwise negative  All other systems have been reviewed and were otherwise negative with the exception of those mentioned in the HPI and as above.  Physical Exam: Constitutional: Alert, well-appearing, no acute distress Ears: External ears without lesions or tenderness. Ear canals she has small ear canals bilaterally with wax obstructing both ear canals.  She presents today with a hearing aid in the left ear but is unable to fit the hearing aid in her right ear without assistance.  Both ear canals were cleaned with a curette forceps.  TMs were clear bilaterally.. Nasal: External nose without lesions. Clear nasal passages Oral: Oropharynx clear. Neck: No palpable adenopathy or masses Respiratory: Breathing comfortably  Skin: No facial/neck lesions or rash noted.  Cerumen impaction removal  Date/Time: 08/14/2019 3:57 PM Performed by: Rozetta Nunnery, MD Authorized by: Rozetta Nunnery, MD   Consent:    Consent obtained:  Verbal   Consent given by:  Patient   Risks discussed:  Pain and bleeding Procedure details:    Location:  L ear and R ear   Procedure type: curette and forceps   Post-procedure details:    Inspection:  TM intact and canal normal   Hearing quality:  Improved   Patient tolerance of procedure:  Tolerated well, no immediate complications Comments:     TMs are clear bilaterally.    Assessment: Bilateral cerumen buildup Bilateral sensorineural hearing loss.  Wears bilateral hearing aids.  Plan: She will follow-up as needed  Radene Journey, MD

## 2019-08-16 DIAGNOSIS — L570 Actinic keratosis: Secondary | ICD-10-CM | POA: Diagnosis not present

## 2019-08-16 DIAGNOSIS — Z85828 Personal history of other malignant neoplasm of skin: Secondary | ICD-10-CM | POA: Diagnosis not present

## 2019-08-16 NOTE — Telephone Encounter (Signed)
This is fine 

## 2019-08-16 NOTE — Telephone Encounter (Signed)
Since we have not seen her for RLS we would have to see her in the office. I think Amy or Aundra Millet could do this.

## 2019-08-16 NOTE — Telephone Encounter (Signed)
I tried to call the patient's daughter back Britta Mccreedy on [place]). LVM asking for call back. When she calls back, please let her know that we are happy to treat the patient for the restless leg syndrome. However we will have to see her first before we can make any changes to the medication. Our next availability with the nurse practitioners is in September (can schedule with Amy or Megan). We can place her on the wait list as well if daughter would like. Primary care will need to manage the Pramipexole until she is seen here. Also let her know Dr. Lucia Gaskins said we can address the restless leg syndrome and do a regular follow up for her tremor that we were already seeing her for at this appointment.

## 2019-08-20 NOTE — Telephone Encounter (Signed)
Spoke with Britta Mccreedy, pt's daughter, and discussed the message below and also let her know Aundra Millet NP has an opening tomorrow AM. Britta Mccreedy is going to talk to the pt and will call us back and let us know how they want to proceed. Her questions were answered during the call. She verbalized appreciation.

## 2019-08-20 NOTE — Telephone Encounter (Signed)
Patient's daughter called and left a voicemail returning Lisa [name]'s  call.

## 2019-08-22 ENCOUNTER — Other Ambulatory Visit: Payer: Self-pay

## 2019-08-22 ENCOUNTER — Inpatient Hospital Stay: Payer: Medicare Other

## 2019-08-22 VITALS — BP 138/68 | Temp 98.6°F | Resp 18

## 2019-08-22 DIAGNOSIS — D631 Anemia in chronic kidney disease: Secondary | ICD-10-CM

## 2019-08-22 DIAGNOSIS — I129 Hypertensive chronic kidney disease with stage 1 through stage 4 chronic kidney disease, or unspecified chronic kidney disease: Secondary | ICD-10-CM | POA: Diagnosis not present

## 2019-08-22 DIAGNOSIS — N189 Chronic kidney disease, unspecified: Secondary | ICD-10-CM

## 2019-08-22 DIAGNOSIS — Z79899 Other long term (current) drug therapy: Secondary | ICD-10-CM | POA: Diagnosis not present

## 2019-08-22 DIAGNOSIS — D509 Iron deficiency anemia, unspecified: Secondary | ICD-10-CM

## 2019-08-22 DIAGNOSIS — D649 Anemia, unspecified: Secondary | ICD-10-CM

## 2019-08-22 LAB — CBC WITH DIFFERENTIAL (CANCER CENTER ONLY)
Abs Immature Granulocytes: 0.02 10*3/uL (ref 0.00–0.07)
Basophils Absolute: 0.1 10*3/uL (ref 0.0–0.1)
Basophils Relative: 1 %
Eosinophils Absolute: 0.1 10*3/uL (ref 0.0–0.5)
Eosinophils Relative: 1 %
HCT: 32.3 % — ABNORMAL LOW (ref 36.0–46.0)
Hemoglobin: 10.1 g/dL — ABNORMAL LOW (ref 12.0–15.0)
Immature Granulocytes: 0 %
Lymphocytes Relative: 6 %
Lymphs Abs: 0.4 10*3/uL — ABNORMAL LOW (ref 0.7–4.0)
MCH: 31.2 pg (ref 26.0–34.0)
MCHC: 31.3 g/dL (ref 30.0–36.0)
MCV: 99.7 fL (ref 80.0–100.0)
Monocytes Absolute: 0.8 10*3/uL (ref 0.1–1.0)
Monocytes Relative: 11 %
Neutro Abs: 5.7 10*3/uL (ref 1.7–7.7)
Neutrophils Relative %: 81 %
Platelet Count: 232 10*3/uL (ref 150–400)
RBC: 3.24 MIL/uL — ABNORMAL LOW (ref 3.87–5.11)
RDW: 19.1 % — ABNORMAL HIGH (ref 11.5–15.5)
WBC Count: 7 10*3/uL (ref 4.0–10.5)
nRBC: 0 % (ref 0.0–0.2)

## 2019-08-22 LAB — SAMPLE TO BLOOD BANK

## 2019-08-22 MED ORDER — DARBEPOETIN ALFA 300 MCG/0.6ML IJ SOSY
300.0000 ug | PREFILLED_SYRINGE | Freq: Once | INTRAMUSCULAR | Status: AC
  Administered 2019-08-22: 300 ug via SUBCUTANEOUS

## 2019-08-22 MED ORDER — DARBEPOETIN ALFA 300 MCG/0.6ML IJ SOSY
PREFILLED_SYRINGE | INTRAMUSCULAR | Status: AC
  Filled 2019-08-22: qty 0.6

## 2019-08-22 NOTE — Patient Instructions (Signed)
Darbepoetin Alfa injection What is this medicine? DARBEPOETIN ALFA (dar be POE e tin AL fa) helps your body make more red blood cells. It is used to treat anemia caused by chronic kidney failure and chemotherapy. This medicine may be used for other purposes; ask your health care provider or pharmacist if you have questions. COMMON BRAND NAME(S): Aranesp What should I tell my health care provider before I take this medicine? They need to know if you have any of these conditions:  blood clotting disorders or history of blood clots  cancer patient not on chemotherapy  cystic fibrosis  heart disease, such as angina, heart failure, or a history of a heart attack  hemoglobin level of 12 g/dL or greater  high blood pressure  low levels of folate, iron, or vitamin B12  seizures  an unusual or allergic reaction to darbepoetin, erythropoietin, albumin, hamster proteins, latex, other medicines, foods, dyes, or preservatives  pregnant or trying to get pregnant  breast-feeding How should I use this medicine? This medicine is for injection into a vein or under the skin. It is usually given by a health care professional in a hospital or clinic setting. If you get this medicine at home, you will be taught how to prepare and give this medicine. Use exactly as directed. Take your medicine at regular intervals. Do not take your medicine more often than directed. It is important that you put your used needles and syringes in a special sharps container. Do not put them in a trash can. If you do not have a sharps container, call your pharmacist or healthcare provider to get one. A special MedGuide will be given to you by the pharmacist with each prescription and refill. Be sure to read this information carefully each time. Talk to your pediatrician regarding the use of this medicine in children. While this medicine may be used in children as young as 1 month of age for selected conditions, precautions do  apply. Overdosage: If you think you have taken too much of this medicine contact a poison control center or emergency room at once. NOTE: This medicine is only for you. Do not share this medicine with others. What if I miss a dose? If you miss a dose, take it as soon as you can. If it is almost time for your next dose, take only that dose. Do not take double or extra doses. What may interact with this medicine? Do not take this medicine with any of the following medications:  epoetin alfa This list may not describe all possible interactions. Give your health care provider a list of all the medicines, herbs, non-prescription drugs, or dietary supplements you use. Also tell them if you smoke, drink alcohol, or use illegal drugs. Some items may interact with your medicine. What should I watch for while using this medicine? Your condition will be monitored carefully while you are receiving this medicine. You may need blood work done while you are taking this medicine. This medicine may cause a decrease in vitamin B6. You should make sure that you get enough vitamin B6 while you are taking this medicine. Discuss the foods you eat and the vitamins you take with your health care professional. What side effects may I notice from receiving this medicine? Side effects that you should report to your doctor or health care professional as soon as possible:  allergic reactions like skin rash, itching or hives, swelling of the face, lips, or tongue  breathing problems  changes in   vision  chest pain  confusion, trouble speaking or understanding  feeling faint or lightheaded, falls  high blood pressure  muscle aches or pains  pain, swelling, warmth in the leg  rapid weight gain  severe headaches  sudden numbness or weakness of the face, arm or leg  trouble walking, dizziness, loss of balance or coordination  seizures (convulsions)  swelling of the ankles, feet, hands  unusually weak or  tired Side effects that usually do not require medical attention (report to your doctor or health care professional if they continue or are bothersome):  diarrhea  fever, chills (flu-like symptoms)  headaches  nausea, vomiting  redness, stinging, or swelling at site where injected This list may not describe all possible side effects. Call your doctor for medical advice about side effects. You may report side effects to FDA at 1-800-FDA-1088. Where should I keep my medicine? Keep out of the reach of children. Store in a refrigerator between 2 and 8 degrees C (36 and 46 degrees F). Do not freeze. Do not shake. Throw away any unused portion if using a single-dose vial. Throw away any unused medicine after the expiration date. NOTE: This sheet is a summary. It may not cover all possible information. If you have questions about this medicine, talk to your doctor, pharmacist, or health care provider.  2020 Elsevier/Gold Standard (2017-03-09 16:44:20)  

## 2019-08-23 LAB — IRON AND TIBC
Iron: 55 ug/dL (ref 41–142)
Saturation Ratios: 27 % (ref 21–57)
TIBC: 204 ug/dL — ABNORMAL LOW (ref 236–444)
UIBC: 149 ug/dL (ref 120–384)

## 2019-08-23 LAB — FERRITIN: Ferritin: 263 ng/mL (ref 11–307)

## 2019-08-23 NOTE — Telephone Encounter (Signed)
Not a problem, thank you!

## 2019-08-23 NOTE — Telephone Encounter (Signed)
Phone rep checked office voicemail's, @10 :14 pt's daughter called asking for an appointment to be made.  Daughter was called, pt is scheduled with Amy,NP for 06-29.  Daughter(Barabra) was told the check in time would be 12:30 she stated waiting 30 mins would be a bit much and that she would have pt here at 12:45 for the 1:00 appointment. This is FYI no call back requested

## 2019-08-29 DIAGNOSIS — E782 Mixed hyperlipidemia: Secondary | ICD-10-CM | POA: Diagnosis not present

## 2019-08-29 DIAGNOSIS — R7301 Impaired fasting glucose: Secondary | ICD-10-CM | POA: Diagnosis not present

## 2019-08-29 DIAGNOSIS — G250 Essential tremor: Secondary | ICD-10-CM | POA: Diagnosis not present

## 2019-08-29 DIAGNOSIS — E559 Vitamin D deficiency, unspecified: Secondary | ICD-10-CM | POA: Diagnosis not present

## 2019-09-04 ENCOUNTER — Ambulatory Visit: Payer: Medicare Other | Admitting: Family Medicine

## 2019-09-04 DIAGNOSIS — M79672 Pain in left foot: Secondary | ICD-10-CM | POA: Diagnosis not present

## 2019-09-04 DIAGNOSIS — B351 Tinea unguium: Secondary | ICD-10-CM | POA: Diagnosis not present

## 2019-09-04 DIAGNOSIS — M79671 Pain in right foot: Secondary | ICD-10-CM | POA: Diagnosis not present

## 2019-09-04 DIAGNOSIS — L84 Corns and callosities: Secondary | ICD-10-CM | POA: Diagnosis not present

## 2019-09-12 ENCOUNTER — Inpatient Hospital Stay: Payer: Medicare Other | Attending: Oncology

## 2019-09-12 ENCOUNTER — Other Ambulatory Visit: Payer: Self-pay

## 2019-09-12 ENCOUNTER — Inpatient Hospital Stay: Payer: Medicare Other

## 2019-09-12 VITALS — BP 149/63 | HR 97 | Resp 18

## 2019-09-12 DIAGNOSIS — D509 Iron deficiency anemia, unspecified: Secondary | ICD-10-CM | POA: Diagnosis not present

## 2019-09-12 DIAGNOSIS — D631 Anemia in chronic kidney disease: Secondary | ICD-10-CM

## 2019-09-12 DIAGNOSIS — D649 Anemia, unspecified: Secondary | ICD-10-CM

## 2019-09-12 DIAGNOSIS — Z79899 Other long term (current) drug therapy: Secondary | ICD-10-CM | POA: Diagnosis not present

## 2019-09-12 DIAGNOSIS — Z7982 Long term (current) use of aspirin: Secondary | ICD-10-CM | POA: Insufficient documentation

## 2019-09-12 DIAGNOSIS — I129 Hypertensive chronic kidney disease with stage 1 through stage 4 chronic kidney disease, or unspecified chronic kidney disease: Secondary | ICD-10-CM | POA: Insufficient documentation

## 2019-09-12 DIAGNOSIS — N189 Chronic kidney disease, unspecified: Secondary | ICD-10-CM | POA: Diagnosis not present

## 2019-09-12 LAB — IRON AND TIBC
Iron: 37 ug/dL — ABNORMAL LOW (ref 41–142)
Saturation Ratios: 17 % — ABNORMAL LOW (ref 21–57)
TIBC: 214 ug/dL — ABNORMAL LOW (ref 236–444)
UIBC: 177 ug/dL (ref 120–384)

## 2019-09-12 LAB — CBC WITH DIFFERENTIAL (CANCER CENTER ONLY)
Abs Immature Granulocytes: 0.04 10*3/uL (ref 0.00–0.07)
Basophils Absolute: 0.1 10*3/uL (ref 0.0–0.1)
Basophils Relative: 1 %
Eosinophils Absolute: 0.2 10*3/uL (ref 0.0–0.5)
Eosinophils Relative: 2 %
HCT: 32.6 % — ABNORMAL LOW (ref 36.0–46.0)
Hemoglobin: 9.9 g/dL — ABNORMAL LOW (ref 12.0–15.0)
Immature Granulocytes: 1 %
Lymphocytes Relative: 6 %
Lymphs Abs: 0.5 10*3/uL — ABNORMAL LOW (ref 0.7–4.0)
MCH: 30.1 pg (ref 26.0–34.0)
MCHC: 30.4 g/dL (ref 30.0–36.0)
MCV: 99.1 fL (ref 80.0–100.0)
Monocytes Absolute: 0.8 10*3/uL (ref 0.1–1.0)
Monocytes Relative: 10 %
Neutro Abs: 6.2 10*3/uL (ref 1.7–7.7)
Neutrophils Relative %: 80 %
Platelet Count: 255 10*3/uL (ref 150–400)
RBC: 3.29 MIL/uL — ABNORMAL LOW (ref 3.87–5.11)
RDW: 16.9 % — ABNORMAL HIGH (ref 11.5–15.5)
WBC Count: 7.6 10*3/uL (ref 4.0–10.5)
nRBC: 0 % (ref 0.0–0.2)

## 2019-09-12 LAB — FERRITIN: Ferritin: 54 ng/mL (ref 11–307)

## 2019-09-12 LAB — SAMPLE TO BLOOD BANK

## 2019-09-12 MED ORDER — DARBEPOETIN ALFA 300 MCG/0.6ML IJ SOSY
300.0000 ug | PREFILLED_SYRINGE | Freq: Once | INTRAMUSCULAR | Status: AC
  Administered 2019-09-12: 300 ug via SUBCUTANEOUS

## 2019-09-12 MED ORDER — DARBEPOETIN ALFA 300 MCG/0.6ML IJ SOSY
PREFILLED_SYRINGE | INTRAMUSCULAR | Status: AC
  Filled 2019-09-12: qty 0.6

## 2019-09-12 NOTE — Patient Instructions (Signed)
Darbepoetin Alfa injection What is this medicine? DARBEPOETIN ALFA (dar be POE e tin AL fa) helps your body make more red blood cells. It is used to treat anemia caused by chronic kidney failure and chemotherapy. This medicine may be used for other purposes; ask your health care provider or pharmacist if you have questions. COMMON BRAND NAME(S): Aranesp What should I tell my health care provider before I take this medicine? They need to know if you have any of these conditions:  blood clotting disorders or history of blood clots  cancer patient not on chemotherapy  cystic fibrosis  heart disease, such as angina, heart failure, or a history of a heart attack  hemoglobin level of 12 g/dL or greater  high blood pressure  low levels of folate, iron, or vitamin B12  seizures  an unusual or allergic reaction to darbepoetin, erythropoietin, albumin, hamster proteins, latex, other medicines, foods, dyes, or preservatives  pregnant or trying to get pregnant  breast-feeding How should I use this medicine? This medicine is for injection into a vein or under the skin. It is usually given by a health care professional in a hospital or clinic setting. If you get this medicine at home, you will be taught how to prepare and give this medicine. Use exactly as directed. Take your medicine at regular intervals. Do not take your medicine more often than directed. It is important that you put your used needles and syringes in a special sharps container. Do not put them in a trash can. If you do not have a sharps container, call your pharmacist or healthcare provider to get one. A special MedGuide will be given to you by the pharmacist with each prescription and refill. Be sure to read this information carefully each time. Talk to your pediatrician regarding the use of this medicine in children. While this medicine may be used in children as young as 1 month of age for selected conditions, precautions do  apply. Overdosage: If you think you have taken too much of this medicine contact a poison control center or emergency room at once. NOTE: This medicine is only for you. Do not share this medicine with others. What if I miss a dose? If you miss a dose, take it as soon as you can. If it is almost time for your next dose, take only that dose. Do not take double or extra doses. What may interact with this medicine? Do not take this medicine with any of the following medications:  epoetin alfa This list may not describe all possible interactions. Give your health care provider a list of all the medicines, herbs, non-prescription drugs, or dietary supplements you use. Also tell them if you smoke, drink alcohol, or use illegal drugs. Some items may interact with your medicine. What should I watch for while using this medicine? Your condition will be monitored carefully while you are receiving this medicine. You may need blood work done while you are taking this medicine. This medicine may cause a decrease in vitamin B6. You should make sure that you get enough vitamin B6 while you are taking this medicine. Discuss the foods you eat and the vitamins you take with your health care professional. What side effects may I notice from receiving this medicine? Side effects that you should report to your doctor or health care professional as soon as possible:  allergic reactions like skin rash, itching or hives, swelling of the face, lips, or tongue  breathing problems  changes in   vision  chest pain  confusion, trouble speaking or understanding  feeling faint or lightheaded, falls  high blood pressure  muscle aches or pains  pain, swelling, warmth in the leg  rapid weight gain  severe headaches  sudden numbness or weakness of the face, arm or leg  trouble walking, dizziness, loss of balance or coordination  seizures (convulsions)  swelling of the ankles, feet, hands  unusually weak or  tired Side effects that usually do not require medical attention (report to your doctor or health care professional if they continue or are bothersome):  diarrhea  fever, chills (flu-like symptoms)  headaches  nausea, vomiting  redness, stinging, or swelling at site where injected This list may not describe all possible side effects. Call your doctor for medical advice about side effects. You may report side effects to FDA at 1-800-FDA-1088. Where should I keep my medicine? Keep out of the reach of children. Store in a refrigerator between 2 and 8 degrees C (36 and 46 degrees F). Do not freeze. Do not shake. Throw away any unused portion if using a single-dose vial. Throw away any unused medicine after the expiration date. NOTE: This sheet is a summary. It may not cover all possible information. If you have questions about this medicine, talk to your doctor, pharmacist, or health care provider.  2020 Elsevier/Gold Standard (2017-03-09 16:44:20)  

## 2019-09-13 DIAGNOSIS — I10 Essential (primary) hypertension: Secondary | ICD-10-CM | POA: Diagnosis not present

## 2019-09-13 DIAGNOSIS — R7301 Impaired fasting glucose: Secondary | ICD-10-CM | POA: Diagnosis not present

## 2019-09-13 DIAGNOSIS — E782 Mixed hyperlipidemia: Secondary | ICD-10-CM | POA: Diagnosis not present

## 2019-09-13 DIAGNOSIS — E559 Vitamin D deficiency, unspecified: Secondary | ICD-10-CM | POA: Diagnosis not present

## 2019-10-04 ENCOUNTER — Other Ambulatory Visit: Payer: Self-pay

## 2019-10-04 ENCOUNTER — Telehealth: Payer: Self-pay | Admitting: Oncology

## 2019-10-04 ENCOUNTER — Inpatient Hospital Stay: Payer: Medicare Other

## 2019-10-04 ENCOUNTER — Inpatient Hospital Stay (HOSPITAL_BASED_OUTPATIENT_CLINIC_OR_DEPARTMENT_OTHER): Payer: Medicare Other | Admitting: Oncology

## 2019-10-04 VITALS — BP 146/57 | HR 102 | Temp 97.7°F | Resp 18 | Ht 61.0 in | Wt 142.0 lb

## 2019-10-04 DIAGNOSIS — N189 Chronic kidney disease, unspecified: Secondary | ICD-10-CM

## 2019-10-04 DIAGNOSIS — D631 Anemia in chronic kidney disease: Secondary | ICD-10-CM

## 2019-10-04 DIAGNOSIS — D509 Iron deficiency anemia, unspecified: Secondary | ICD-10-CM | POA: Diagnosis not present

## 2019-10-04 DIAGNOSIS — D649 Anemia, unspecified: Secondary | ICD-10-CM

## 2019-10-04 DIAGNOSIS — I129 Hypertensive chronic kidney disease with stage 1 through stage 4 chronic kidney disease, or unspecified chronic kidney disease: Secondary | ICD-10-CM | POA: Diagnosis not present

## 2019-10-04 DIAGNOSIS — Z7982 Long term (current) use of aspirin: Secondary | ICD-10-CM | POA: Diagnosis not present

## 2019-10-04 DIAGNOSIS — Z79899 Other long term (current) drug therapy: Secondary | ICD-10-CM | POA: Diagnosis not present

## 2019-10-04 LAB — CBC WITH DIFFERENTIAL (CANCER CENTER ONLY)
Abs Immature Granulocytes: 0.04 10*3/uL (ref 0.00–0.07)
Basophils Absolute: 0.1 10*3/uL (ref 0.0–0.1)
Basophils Relative: 1 %
Eosinophils Absolute: 0.1 10*3/uL (ref 0.0–0.5)
Eosinophils Relative: 2 %
HCT: 27.2 % — ABNORMAL LOW (ref 36.0–46.0)
Hemoglobin: 8.4 g/dL — ABNORMAL LOW (ref 12.0–15.0)
Immature Granulocytes: 0 %
Lymphocytes Relative: 6 %
Lymphs Abs: 0.6 10*3/uL — ABNORMAL LOW (ref 0.7–4.0)
MCH: 28.3 pg (ref 26.0–34.0)
MCHC: 30.9 g/dL (ref 30.0–36.0)
MCV: 91.6 fL (ref 80.0–100.0)
Monocytes Absolute: 1 10*3/uL (ref 0.1–1.0)
Monocytes Relative: 11 %
Neutro Abs: 7.4 10*3/uL (ref 1.7–7.7)
Neutrophils Relative %: 80 %
Platelet Count: 247 10*3/uL (ref 150–400)
RBC: 2.97 MIL/uL — ABNORMAL LOW (ref 3.87–5.11)
RDW: 16.7 % — ABNORMAL HIGH (ref 11.5–15.5)
WBC Count: 9.1 10*3/uL (ref 4.0–10.5)
nRBC: 0 % (ref 0.0–0.2)

## 2019-10-04 LAB — SAMPLE TO BLOOD BANK

## 2019-10-04 MED ORDER — DARBEPOETIN ALFA 300 MCG/0.6ML IJ SOSY
PREFILLED_SYRINGE | INTRAMUSCULAR | Status: AC
  Filled 2019-10-04: qty 0.6

## 2019-10-04 MED ORDER — DARBEPOETIN ALFA 300 MCG/0.6ML IJ SOSY
300.0000 ug | PREFILLED_SYRINGE | Freq: Once | INTRAMUSCULAR | Status: AC
  Administered 2019-10-04: 300 ug via SUBCUTANEOUS

## 2019-10-04 NOTE — Telephone Encounter (Signed)
Scheduled per 07/29 los, patient received updated calender.

## 2019-10-04 NOTE — Progress Notes (Signed)
Hematology and Oncology Follow Up Visit  Lisa [name] [id number] 1923/11/07 86 y.o. 10/04/2019 3:00 PM Lisa [name], MDBarnes, Lisa Manis, MD   Principle Diagnosis: 86 year old woman with anemia related to iron deficiency and renal insufficiency diagnosed in 2018.     Prior Therapy: IV iron infusion infusion as well as packed red cell transfusion.  Current therapy:   Aranesp 300 mcg every 3 weeks to keep her hemoglobin above 10.   Feraheme at 1000 mg every 6 to 9 weeks.  Packed red cell transfusion for hemoglobin below 8.  Interim History: [name] returns today for a follow-up visit.  Since her last visit, she reports no complications at this time.  She denies any nausea, fatigue or weight loss.  She denies any decline in her energy or shortness of breath.  He denies any hematochezia or melena.                 Medications: Unchanged on review. Current Outpatient Medications  Medication Sig Dispense Refill  . acetaminophen (TYLENOL) 500 MG tablet Take 500-1,000 mg by mouth at bedtime.    Marland Kitchen aspirin EC 81 MG tablet Take 81 mg by mouth daily.    . calcium carbonate (OS-CAL) 600 MG TABS Take 600 mg by mouth daily.     . hydrocortisone 2.5 % ointment     . loratadine (CLARITIN) 10 MG tablet Take 10 mg by mouth daily as needed.     . Multiple Vitamins-Minerals (MULTIVITAMIN WITH MINERALS) tablet Take 1 tablet by mouth daily.    . mupirocin ointment (BACTROBAN) 2 % APPLY TO AFFECTED AREA EVERY DAY    . OVER THE COUNTER MEDICATION Apply topically as needed. Cream for arthritic pain. Patient unable to recall the name at this time.    Marland Kitchen oxybutynin (DITROPAN) 5 MG tablet Take 2.5 mg by mouth 2 (two) times daily.  2  . pramipexole (MIRAPEX) 0.25 MG tablet Take 0.25 mg by mouth at bedtime.    . primidone (MYSOLINE) 50 MG tablet Take 3 tablets (150 mg total) by mouth at bedtime. 270 tablet 3  . raloxifene (EVISTA) 60 MG tablet Take 60 mg by mouth at bedtime.     .  timolol (TIMOPTIC) 0.5 % ophthalmic solution Place 1 drop into both eyes daily.   1  . UNABLE TO FIND Med Name: Aranesp infusions every 3 weeks administered by [facility]    . VYTORIN 10-20 MG tablet Take 1 tablet by mouth daily.  0   No current facility-administered medications for this visit.     Allergies:  Allergies  Allergen Reactions  . Mycostatin [Nystatin] Swelling and Rash      Physical Exam:     Blood pressure (!) 146/57, pulse 102, temperature 97.7 F (36.5 C), temperature source Temporal, resp. rate 18, height 5\' 1"  (1.549 m), weight 142 lb (64.4 kg), SpO2 97 %.       ECOG: 1    General appearance: Comfortable appearing without any discomfort Head: Normocephalic without any trauma Oropharynx: Mucous membranes are moist and pink without any thrush or ulcers. Eyes: Pupils are equal and round reactive to light. Lymph nodes: No cervical, supraclavicular, inguinal or axillary lymphadenopathy.   Heart:regular rate and rhythm.  S1 and S2 without leg edema. Lung: Clear without any rhonchi or wheezes.  No dullness to percussion. Abdomin: Soft, nontender, nondistended with good bowel sounds.  No hepatosplenomegaly. Musculoskeletal: No joint deformity or effusion.  Full range of motion noted. Neurological: No deficits noted  on motor, sensory and deep tendon reflex exam. Skin: No petechial rash or dryness.  Appeared moist.    .           Lab Results: Lab Results  Component Value Date   WBC 9.1 10/04/2019   HGB 8.4 (L) 10/04/2019   HCT 27.2 (L) 10/04/2019   MCV 91.6 10/04/2019   PLT 247 10/04/2019     Chemistry      Component Value Date/Time   NA 140 05/10/2019 1437   NA 135 (A) 04/01/2016 0000   K 4.7 05/10/2019 1437   CL 104 05/10/2019 1437   CO2 29 05/10/2019 1437   BUN 21 05/10/2019 1437   BUN 19 04/01/2016 0000   CREATININE 0.84 05/10/2019 1437   GLU 102 04/01/2016 0000      Component Value Date/Time   CALCIUM 8.6 (L)  05/10/2019 1437   ALKPHOS 75 05/10/2019 1437   AST 18 05/10/2019 1437   ALT 16 05/10/2019 1437   BILITOT <0.2 (L) 05/10/2019 1437       Impression and Plan:  86 year old woman with:   1.  Multifactorial anemia due to iron deficiency as well as renal insufficiency diagnosed in 2018.  Her hemoglobin today is down currently at 8.4 although she has not required transfusion for an extended period of time.  Risks and benefits of resuming Aranesp were reviewed.  He is includes hypertension thromboembolism were discussed.  He is agreeable to proceed at this time.  2.  Iron deficiency: Iron levels on July 7 show iron level of 37 with saturation of 17% indicating further decline.  Risks and benefits of IV iron infusion were reviewed today and she is agreeable to proceed.  She will receive Feraheme infusion on 730 and repeated a week later.      3.  Follow-up: She will continue every 3 weeks evaluation for Aranesp and potential packed red cell transfusion.  She will have MD follow-up in 12 weeks.   30 minutes were dedicated to this encounter.  Time was spent on reviewing her disease status, treatment options and complications related to therapy.     Eli Hose, MD 7/29/20213:00 PM

## 2019-10-04 NOTE — Patient Instructions (Signed)
Darbepoetin Alfa injection What is this medicine? DARBEPOETIN ALFA (dar be POE e tin AL fa) helps your body make more red blood cells. It is used to treat anemia caused by chronic kidney failure and chemotherapy. This medicine may be used for other purposes; ask your health care provider or pharmacist if you have questions. COMMON BRAND NAME(S): Aranesp What should I tell my health care provider before I take this medicine? They need to know if you have any of these conditions:  blood clotting disorders or history of blood clots  cancer patient not on chemotherapy  cystic fibrosis  heart disease, such as angina, heart failure, or a history of a heart attack  hemoglobin level of 12 g/dL or greater  high blood pressure  low levels of folate, iron, or vitamin B12  seizures  an unusual or allergic reaction to darbepoetin, erythropoietin, albumin, hamster proteins, latex, other medicines, foods, dyes, or preservatives  pregnant or trying to get pregnant  breast-feeding How should I use this medicine? This medicine is for injection into a vein or under the skin. It is usually given by a health care professional in a hospital or clinic setting. If you get this medicine at home, you will be taught how to prepare and give this medicine. Use exactly as directed. Take your medicine at regular intervals. Do not take your medicine more often than directed. It is important that you put your used needles and syringes in a special sharps container. Do not put them in a trash can. If you do not have a sharps container, call your pharmacist or healthcare provider to get one. A special MedGuide will be given to you by the pharmacist with each prescription and refill. Be sure to read this information carefully each time. Talk to your pediatrician regarding the use of this medicine in children. While this medicine may be used in children as young as 1 month of age for selected conditions, precautions do  apply. Overdosage: If you think you have taken too much of this medicine contact a poison control center or emergency room at once. NOTE: This medicine is only for you. Do not share this medicine with others. What if I miss a dose? If you miss a dose, take it as soon as you can. If it is almost time for your next dose, take only that dose. Do not take double or extra doses. What may interact with this medicine? Do not take this medicine with any of the following medications:  epoetin alfa This list may not describe all possible interactions. Give your health care provider a list of all the medicines, herbs, non-prescription drugs, or dietary supplements you use. Also tell them if you smoke, drink alcohol, or use illegal drugs. Some items may interact with your medicine. What should I watch for while using this medicine? Your condition will be monitored carefully while you are receiving this medicine. You may need blood work done while you are taking this medicine. This medicine may cause a decrease in vitamin B6. You should make sure that you get enough vitamin B6 while you are taking this medicine. Discuss the foods you eat and the vitamins you take with your health care professional. What side effects may I notice from receiving this medicine? Side effects that you should report to your doctor or health care professional as soon as possible:  allergic reactions like skin rash, itching or hives, swelling of the face, lips, or tongue  breathing problems  changes in   vision  chest pain  confusion, trouble speaking or understanding  feeling faint or lightheaded, falls  high blood pressure  muscle aches or pains  pain, swelling, warmth in the leg  rapid weight gain  severe headaches  sudden numbness or weakness of the face, arm or leg  trouble walking, dizziness, loss of balance or coordination  seizures (convulsions)  swelling of the ankles, feet, hands  unusually weak or  tired Side effects that usually do not require medical attention (report to your doctor or health care professional if they continue or are bothersome):  diarrhea  fever, chills (flu-like symptoms)  headaches  nausea, vomiting  redness, stinging, or swelling at site where injected This list may not describe all possible side effects. Call your doctor for medical advice about side effects. You may report side effects to FDA at 1-800-FDA-1088. Where should I keep my medicine? Keep out of the reach of children. Store in a refrigerator between 2 and 8 degrees C (36 and 46 degrees F). Do not freeze. Do not shake. Throw away any unused portion if using a single-dose vial. Throw away any unused medicine after the expiration date. NOTE: This sheet is a summary. It may not cover all possible information. If you have questions about this medicine, talk to your doctor, pharmacist, or health care provider.  2020 Elsevier/Gold Standard (2017-03-09 16:44:20)  

## 2019-10-05 ENCOUNTER — Ambulatory Visit: Payer: Medicare Other

## 2019-10-05 ENCOUNTER — Inpatient Hospital Stay: Payer: Medicare Other

## 2019-10-05 ENCOUNTER — Other Ambulatory Visit: Payer: Self-pay

## 2019-10-05 VITALS — BP 114/70 | HR 98 | Temp 98.0°F | Resp 18

## 2019-10-05 DIAGNOSIS — Z79899 Other long term (current) drug therapy: Secondary | ICD-10-CM | POA: Diagnosis not present

## 2019-10-05 DIAGNOSIS — D509 Iron deficiency anemia, unspecified: Secondary | ICD-10-CM

## 2019-10-05 DIAGNOSIS — D631 Anemia in chronic kidney disease: Secondary | ICD-10-CM | POA: Diagnosis not present

## 2019-10-05 DIAGNOSIS — N189 Chronic kidney disease, unspecified: Secondary | ICD-10-CM

## 2019-10-05 DIAGNOSIS — D649 Anemia, unspecified: Secondary | ICD-10-CM

## 2019-10-05 DIAGNOSIS — Z7982 Long term (current) use of aspirin: Secondary | ICD-10-CM | POA: Diagnosis not present

## 2019-10-05 DIAGNOSIS — I129 Hypertensive chronic kidney disease with stage 1 through stage 4 chronic kidney disease, or unspecified chronic kidney disease: Secondary | ICD-10-CM | POA: Diagnosis not present

## 2019-10-05 LAB — IRON AND TIBC
Iron: 40 ug/dL — ABNORMAL LOW (ref 41–142)
Saturation Ratios: 16 % — ABNORMAL LOW (ref 21–57)
TIBC: 243 ug/dL (ref 236–444)
UIBC: 203 ug/dL (ref 120–384)

## 2019-10-05 LAB — FERRITIN: Ferritin: 20 ng/mL (ref 11–307)

## 2019-10-05 MED ORDER — SODIUM CHLORIDE 0.9 % IV SOLN
Freq: Once | INTRAVENOUS | Status: AC
  Filled 2019-10-05: qty 250

## 2019-10-05 MED ORDER — SODIUM CHLORIDE 0.9 % IV SOLN
510.0000 mg | Freq: Once | INTRAVENOUS | Status: AC
  Administered 2019-10-05: 510 mg via INTRAVENOUS
  Filled 2019-10-05: qty 510

## 2019-10-05 NOTE — Patient Instructions (Signed)

## 2019-10-11 ENCOUNTER — Ambulatory Visit: Payer: Medicare Other

## 2019-10-11 ENCOUNTER — Inpatient Hospital Stay: Payer: Medicare Other | Attending: Oncology

## 2019-10-11 ENCOUNTER — Other Ambulatory Visit: Payer: Self-pay

## 2019-10-11 VITALS — BP 116/40 | HR 92 | Temp 99.0°F | Resp 18

## 2019-10-11 DIAGNOSIS — N189 Chronic kidney disease, unspecified: Secondary | ICD-10-CM | POA: Insufficient documentation

## 2019-10-11 DIAGNOSIS — D631 Anemia in chronic kidney disease: Secondary | ICD-10-CM | POA: Diagnosis not present

## 2019-10-11 DIAGNOSIS — I129 Hypertensive chronic kidney disease with stage 1 through stage 4 chronic kidney disease, or unspecified chronic kidney disease: Secondary | ICD-10-CM | POA: Diagnosis not present

## 2019-10-11 DIAGNOSIS — Z79899 Other long term (current) drug therapy: Secondary | ICD-10-CM | POA: Insufficient documentation

## 2019-10-11 DIAGNOSIS — D509 Iron deficiency anemia, unspecified: Secondary | ICD-10-CM

## 2019-10-11 DIAGNOSIS — D649 Anemia, unspecified: Secondary | ICD-10-CM

## 2019-10-11 MED ORDER — EPINEPHRINE 0.3 MG/0.3ML IJ SOAJ: 0.3000 mg | Freq: Once | INTRAMUSCULAR | Status: DC | PRN

## 2019-10-11 MED ORDER — ALBUTEROL SULFATE (2.5 MG/3ML) 0.083% IN NEBU
2.5000 mg | INHALATION_SOLUTION | Freq: Once | RESPIRATORY_TRACT | Status: DC | PRN
  Filled 2019-10-11: qty 3

## 2019-10-11 MED ORDER — METHYLPREDNISOLONE SODIUM SUCC 125 MG IJ SOLR: 125.0000 mg | Freq: Once | INTRAMUSCULAR | Status: DC | PRN

## 2019-10-11 MED ORDER — DIPHENHYDRAMINE HCL 50 MG/ML IJ SOLN: 50.0000 mg | Freq: Once | INTRAMUSCULAR | Status: DC | PRN

## 2019-10-11 MED ORDER — FAMOTIDINE IN NACL 20-0.9 MG/50ML-% IV SOLN: 20.0000 mg | Freq: Once | INTRAVENOUS | Status: DC | PRN

## 2019-10-11 MED ORDER — SODIUM CHLORIDE 0.9 % IV SOLN
Freq: Once | INTRAVENOUS | Status: AC
  Filled 2019-10-11: qty 250

## 2019-10-11 MED ORDER — SODIUM CHLORIDE 0.9 % IV SOLN
510.0000 mg | Freq: Once | INTRAVENOUS | Status: AC
  Administered 2019-10-11: 510 mg via INTRAVENOUS
  Filled 2019-10-11: qty 510

## 2019-10-11 MED ORDER — SODIUM CHLORIDE 0.9 % IV SOLN
Freq: Once | INTRAVENOUS | Status: DC | PRN
  Filled 2019-10-11: qty 250

## 2019-10-11 NOTE — Patient Instructions (Signed)

## 2019-10-23 ENCOUNTER — Other Ambulatory Visit: Payer: Self-pay

## 2019-10-23 ENCOUNTER — Other Ambulatory Visit: Payer: Self-pay | Admitting: Oncology

## 2019-10-23 DIAGNOSIS — D631 Anemia in chronic kidney disease: Secondary | ICD-10-CM

## 2019-10-23 DIAGNOSIS — N189 Chronic kidney disease, unspecified: Secondary | ICD-10-CM

## 2019-10-24 ENCOUNTER — Other Ambulatory Visit: Payer: Self-pay

## 2019-10-24 ENCOUNTER — Inpatient Hospital Stay: Payer: Medicare Other

## 2019-10-24 VITALS — BP 131/78 | HR 100 | Resp 18

## 2019-10-24 DIAGNOSIS — N189 Chronic kidney disease, unspecified: Secondary | ICD-10-CM

## 2019-10-24 DIAGNOSIS — D631 Anemia in chronic kidney disease: Secondary | ICD-10-CM

## 2019-10-24 DIAGNOSIS — D649 Anemia, unspecified: Secondary | ICD-10-CM

## 2019-10-24 DIAGNOSIS — D509 Iron deficiency anemia, unspecified: Secondary | ICD-10-CM

## 2019-10-24 DIAGNOSIS — Z79899 Other long term (current) drug therapy: Secondary | ICD-10-CM | POA: Diagnosis not present

## 2019-10-24 DIAGNOSIS — I129 Hypertensive chronic kidney disease with stage 1 through stage 4 chronic kidney disease, or unspecified chronic kidney disease: Secondary | ICD-10-CM | POA: Diagnosis not present

## 2019-10-24 LAB — CBC WITH DIFFERENTIAL (CANCER CENTER ONLY)
Abs Immature Granulocytes: 0.02 10*3/uL (ref 0.00–0.07)
Basophils Absolute: 0.1 10*3/uL (ref 0.0–0.1)
Basophils Relative: 1 %
Eosinophils Absolute: 0.1 10*3/uL (ref 0.0–0.5)
Eosinophils Relative: 2 %
HCT: 30.7 % — ABNORMAL LOW (ref 36.0–46.0)
Hemoglobin: 9.7 g/dL — ABNORMAL LOW (ref 12.0–15.0)
Immature Granulocytes: 0 %
Lymphocytes Relative: 8 %
Lymphs Abs: 0.6 10*3/uL — ABNORMAL LOW (ref 0.7–4.0)
MCH: 30.9 pg (ref 26.0–34.0)
MCHC: 31.6 g/dL (ref 30.0–36.0)
MCV: 97.8 fL (ref 80.0–100.0)
Monocytes Absolute: 0.9 10*3/uL (ref 0.1–1.0)
Monocytes Relative: 12 %
Neutro Abs: 5.6 10*3/uL (ref 1.7–7.7)
Neutrophils Relative %: 77 %
Platelet Count: 251 10*3/uL (ref 150–400)
RBC: 3.14 MIL/uL — ABNORMAL LOW (ref 3.87–5.11)
RDW: 19 % — ABNORMAL HIGH (ref 11.5–15.5)
WBC Count: 7.3 10*3/uL (ref 4.0–10.5)
nRBC: 0 % (ref 0.0–0.2)

## 2019-10-24 LAB — SAMPLE TO BLOOD BANK

## 2019-10-24 MED ORDER — DARBEPOETIN ALFA 300 MCG/0.6ML IJ SOSY
300.0000 ug | PREFILLED_SYRINGE | Freq: Once | INTRAMUSCULAR | Status: AC
  Administered 2019-10-24: 300 ug via SUBCUTANEOUS

## 2019-10-24 MED ORDER — DARBEPOETIN ALFA 300 MCG/0.6ML IJ SOSY
PREFILLED_SYRINGE | INTRAMUSCULAR | Status: AC
  Filled 2019-10-24: qty 0.6

## 2019-10-24 NOTE — Patient Instructions (Signed)
Darbepoetin Alfa injection What is this medicine? DARBEPOETIN ALFA (dar be POE e tin AL fa) helps your body make more red blood cells. It is used to treat anemia caused by chronic kidney failure and chemotherapy. This medicine may be used for other purposes; ask your health care provider or pharmacist if you have questions. COMMON BRAND NAME(S): Aranesp What should I tell my health care provider before I take this medicine? They need to know if you have any of these conditions:  blood clotting disorders or history of blood clots  cancer patient not on chemotherapy  cystic fibrosis  heart disease, such as angina, heart failure, or a history of a heart attack  hemoglobin level of 12 g/dL or greater  high blood pressure  low levels of folate, iron, or vitamin B12  seizures  an unusual or allergic reaction to darbepoetin, erythropoietin, albumin, hamster proteins, latex, other medicines, foods, dyes, or preservatives  pregnant or trying to get pregnant  breast-feeding How should I use this medicine? This medicine is for injection into a vein or under the skin. It is usually given by a health care professional in a hospital or clinic setting. If you get this medicine at home, you will be taught how to prepare and give this medicine. Use exactly as directed. Take your medicine at regular intervals. Do not take your medicine more often than directed. It is important that you put your used needles and syringes in a special sharps container. Do not put them in a trash can. If you do not have a sharps container, call your pharmacist or healthcare provider to get one. A special MedGuide will be given to you by the pharmacist with each prescription and refill. Be sure to read this information carefully each time. Talk to your pediatrician regarding the use of this medicine in children. While this medicine may be used in children as young as 1 month of age for selected conditions, precautions do  apply. Overdosage: If you think you have taken too much of this medicine contact a poison control center or emergency room at once. NOTE: This medicine is only for you. Do not share this medicine with others. What if I miss a dose? If you miss a dose, take it as soon as you can. If it is almost time for your next dose, take only that dose. Do not take double or extra doses. What may interact with this medicine? Do not take this medicine with any of the following medications:  epoetin alfa This list may not describe all possible interactions. Give your health care provider a list of all the medicines, herbs, non-prescription drugs, or dietary supplements you use. Also tell them if you smoke, drink alcohol, or use illegal drugs. Some items may interact with your medicine. What should I watch for while using this medicine? Your condition will be monitored carefully while you are receiving this medicine. You may need blood work done while you are taking this medicine. This medicine may cause a decrease in vitamin B6. You should make sure that you get enough vitamin B6 while you are taking this medicine. Discuss the foods you eat and the vitamins you take with your health care professional. What side effects may I notice from receiving this medicine? Side effects that you should report to your doctor or health care professional as soon as possible:  allergic reactions like skin rash, itching or hives, swelling of the face, lips, or tongue  breathing problems  changes in   vision  chest pain  confusion, trouble speaking or understanding  feeling faint or lightheaded, falls  high blood pressure  muscle aches or pains  pain, swelling, warmth in the leg  rapid weight gain  severe headaches  sudden numbness or weakness of the face, arm or leg  trouble walking, dizziness, loss of balance or coordination  seizures (convulsions)  swelling of the ankles, feet, hands  unusually weak or  tired Side effects that usually do not require medical attention (report to your doctor or health care professional if they continue or are bothersome):  diarrhea  fever, chills (flu-like symptoms)  headaches  nausea, vomiting  redness, stinging, or swelling at site where injected This list may not describe all possible side effects. Call your doctor for medical advice about side effects. You may report side effects to FDA at 1-800-FDA-1088. Where should I keep my medicine? Keep out of the reach of children. Store in a refrigerator between 2 and 8 degrees C (36 and 46 degrees F). Do not freeze. Do not shake. Throw away any unused portion if using a single-dose vial. Throw away any unused medicine after the expiration date. NOTE: This sheet is a summary. It may not cover all possible information. If you have questions about this medicine, talk to your doctor, pharmacist, or health care provider.  2020 Elsevier/Gold Standard (2017-03-09 16:44:20)  

## 2019-10-25 LAB — FERRITIN: Ferritin: 258 ng/mL (ref 11–307)

## 2019-10-25 LAB — IRON AND TIBC
Iron: 47 ug/dL (ref 41–142)
Saturation Ratios: 25 % (ref 21–57)
TIBC: 190 ug/dL — ABNORMAL LOW (ref 236–444)
UIBC: 143 ug/dL (ref 120–384)

## 2019-11-15 ENCOUNTER — Inpatient Hospital Stay: Payer: Medicare Other

## 2019-11-15 ENCOUNTER — Other Ambulatory Visit: Payer: Self-pay

## 2019-11-15 ENCOUNTER — Inpatient Hospital Stay: Payer: Medicare Other | Attending: Oncology

## 2019-11-15 VITALS — BP 133/60 | HR 102 | Resp 20

## 2019-11-15 DIAGNOSIS — D631 Anemia in chronic kidney disease: Secondary | ICD-10-CM | POA: Diagnosis not present

## 2019-11-15 DIAGNOSIS — N189 Chronic kidney disease, unspecified: Secondary | ICD-10-CM | POA: Diagnosis not present

## 2019-11-15 DIAGNOSIS — D649 Anemia, unspecified: Secondary | ICD-10-CM

## 2019-11-15 DIAGNOSIS — Z79899 Other long term (current) drug therapy: Secondary | ICD-10-CM | POA: Insufficient documentation

## 2019-11-15 DIAGNOSIS — Z23 Encounter for immunization: Secondary | ICD-10-CM | POA: Diagnosis not present

## 2019-11-15 DIAGNOSIS — D509 Iron deficiency anemia, unspecified: Secondary | ICD-10-CM | POA: Insufficient documentation

## 2019-11-15 DIAGNOSIS — I129 Hypertensive chronic kidney disease with stage 1 through stage 4 chronic kidney disease, or unspecified chronic kidney disease: Secondary | ICD-10-CM | POA: Diagnosis not present

## 2019-11-15 LAB — CBC WITH DIFFERENTIAL (CANCER CENTER ONLY)
Abs Immature Granulocytes: 0.04 10*3/uL (ref 0.00–0.07)
Basophils Absolute: 0 10*3/uL (ref 0.0–0.1)
Basophils Relative: 0 %
Eosinophils Absolute: 0.1 10*3/uL (ref 0.0–0.5)
Eosinophils Relative: 1 %
HCT: 27.2 % — ABNORMAL LOW (ref 36.0–46.0)
Hemoglobin: 8.8 g/dL — ABNORMAL LOW (ref 12.0–15.0)
Immature Granulocytes: 1 %
Lymphocytes Relative: 6 %
Lymphs Abs: 0.5 10*3/uL — ABNORMAL LOW (ref 0.7–4.0)
MCH: 31 pg (ref 26.0–34.0)
MCHC: 32.4 g/dL (ref 30.0–36.0)
MCV: 95.8 fL (ref 80.0–100.0)
Monocytes Absolute: 1 10*3/uL (ref 0.1–1.0)
Monocytes Relative: 12 %
Neutro Abs: 7.1 10*3/uL (ref 1.7–7.7)
Neutrophils Relative %: 80 %
Platelet Count: 273 10*3/uL (ref 150–400)
RBC: 2.84 MIL/uL — ABNORMAL LOW (ref 3.87–5.11)
RDW: 16.7 % — ABNORMAL HIGH (ref 11.5–15.5)
WBC Count: 8.8 10*3/uL (ref 4.0–10.5)
nRBC: 0 % (ref 0.0–0.2)

## 2019-11-15 LAB — SAMPLE TO BLOOD BANK

## 2019-11-15 MED ORDER — DARBEPOETIN ALFA 300 MCG/0.6ML IJ SOSY: 300.0000 ug | PREFILLED_SYRINGE | Freq: Once | INTRAMUSCULAR | Status: DC

## 2019-11-15 MED ORDER — DARBEPOETIN ALFA 300 MCG/0.6ML IJ SOSY
PREFILLED_SYRINGE | INTRAMUSCULAR | Status: AC
  Filled 2019-11-15: qty 0.6

## 2019-11-15 NOTE — Patient Instructions (Signed)
Darbepoetin Alfa injection What is this medicine? DARBEPOETIN ALFA (dar be POE e tin AL fa) helps your body make more red blood cells. It is used to treat anemia caused by chronic kidney failure and chemotherapy. This medicine may be used for other purposes; ask your health care provider or pharmacist if you have questions. COMMON BRAND NAME(S): Aranesp What should I tell my health care provider before I take this medicine? They need to know if you have any of these conditions:  blood clotting disorders or history of blood clots  cancer patient not on chemotherapy  cystic fibrosis  heart disease, such as angina, heart failure, or a history of a heart attack  hemoglobin level of 12 g/dL or greater  high blood pressure  low levels of folate, iron, or vitamin B12  seizures  an unusual or allergic reaction to darbepoetin, erythropoietin, albumin, hamster proteins, latex, other medicines, foods, dyes, or preservatives  pregnant or trying to get pregnant  breast-feeding How should I use this medicine? This medicine is for injection into a vein or under the skin. It is usually given by a health care professional in a hospital or clinic setting. If you get this medicine at home, you will be taught how to prepare and give this medicine. Use exactly as directed. Take your medicine at regular intervals. Do not take your medicine more often than directed. It is important that you put your used needles and syringes in a special sharps container. Do not put them in a trash can. If you do not have a sharps container, call your pharmacist or healthcare provider to get one. A special MedGuide will be given to you by the pharmacist with each prescription and refill. Be sure to read this information carefully each time. Talk to your pediatrician regarding the use of this medicine in children. While this medicine may be used in children as young as 1 month of age for selected conditions, precautions do  apply. Overdosage: If you think you have taken too much of this medicine contact a poison control center or emergency room at once. NOTE: This medicine is only for you. Do not share this medicine with others. What if I miss a dose? If you miss a dose, take it as soon as you can. If it is almost time for your next dose, take only that dose. Do not take double or extra doses. What may interact with this medicine? Do not take this medicine with any of the following medications:  epoetin alfa This list may not describe all possible interactions. Give your health care provider a list of all the medicines, herbs, non-prescription drugs, or dietary supplements you use. Also tell them if you smoke, drink alcohol, or use illegal drugs. Some items may interact with your medicine. What should I watch for while using this medicine? Your condition will be monitored carefully while you are receiving this medicine. You may need blood work done while you are taking this medicine. This medicine may cause a decrease in vitamin B6. You should make sure that you get enough vitamin B6 while you are taking this medicine. Discuss the foods you eat and the vitamins you take with your health care professional. What side effects may I notice from receiving this medicine? Side effects that you should report to your doctor or health care professional as soon as possible:  allergic reactions like skin rash, itching or hives, swelling of the face, lips, or tongue  breathing problems  changes in   vision  chest pain  confusion, trouble speaking or understanding  feeling faint or lightheaded, falls  high blood pressure  muscle aches or pains  pain, swelling, warmth in the leg  rapid weight gain  severe headaches  sudden numbness or weakness of the face, arm or leg  trouble walking, dizziness, loss of balance or coordination  seizures (convulsions)  swelling of the ankles, feet, hands  unusually weak or  tired Side effects that usually do not require medical attention (report to your doctor or health care professional if they continue or are bothersome):  diarrhea  fever, chills (flu-like symptoms)  headaches  nausea, vomiting  redness, stinging, or swelling at site where injected This list may not describe all possible side effects. Call your doctor for medical advice about side effects. You may report side effects to FDA at 1-800-FDA-1088. Where should I keep my medicine? Keep out of the reach of children. Store in a refrigerator between 2 and 8 degrees C (36 and 46 degrees F). Do not freeze. Do not shake. Throw away any unused portion if using a single-dose vial. Throw away any unused medicine after the expiration date. NOTE: This sheet is a summary. It may not cover all possible information. If you have questions about this medicine, talk to your doctor, pharmacist, or health care provider.  2020 Elsevier/Gold Standard (2017-03-09 16:44:20)  

## 2019-11-16 LAB — FERRITIN: Ferritin: 54 ng/mL (ref 11–307)

## 2019-11-16 LAB — IRON AND TIBC
Iron: 28 ug/dL — ABNORMAL LOW (ref 41–142)
Saturation Ratios: 14 % — ABNORMAL LOW (ref 21–57)
TIBC: 203 ug/dL — ABNORMAL LOW (ref 236–444)
UIBC: 175 ug/dL (ref 120–384)

## 2019-11-20 ENCOUNTER — Ambulatory Visit: Payer: Medicare Other | Admitting: Family Medicine

## 2019-11-20 ENCOUNTER — Other Ambulatory Visit: Payer: Self-pay

## 2019-11-20 ENCOUNTER — Encounter: Payer: Self-pay | Admitting: Family Medicine

## 2019-11-20 VITALS — BP 131/65 | HR 109 | Ht 60.0 in | Wt 139.0 lb

## 2019-11-20 DIAGNOSIS — G250 Essential tremor: Secondary | ICD-10-CM

## 2019-11-20 DIAGNOSIS — G2581 Restless legs syndrome: Secondary | ICD-10-CM

## 2019-11-20 MED ORDER — PRAMIPEXOLE DIHYDROCHLORIDE 0.25 MG PO TABS: 0.2500 mg | ORAL_TABLET | Freq: Every day | ORAL | 3 refills | Status: DC

## 2019-11-20 MED ORDER — PRIMIDONE 50 MG PO TABS: 150.0000 mg | ORAL_TABLET | Freq: Every day | ORAL | 3 refills | Status: AC

## 2019-11-20 NOTE — Progress Notes (Addendum)
PATIENT: Lisa [name] DOB: 1923-07-31  REASON FOR VISIT: follow up HISTORY FROM: patient  Chief Complaint  Patient presents with  . Follow-up    rm 1  . Tremors    Pt here with her daughter. Pt sais she is here for a f/u on RLS.     HISTORY OF PRESENT ILLNESS: Today 11/21/19 Lisa [name] is a 86 y.o. female here today for follow up for tremor. She continues primidone  at bedtime. She has taken this since 2017. Propranolol was discontinued in 2017 due to reports of feeling tired. She continues to have tremor of face, chin and hands. She does not feel she can tolerate increased dose of primidone.   PCP started pramipexole 0.25mg  2-3 years ago. She initially felt that medication worked well. Over the past few months, RLS has worsened. She complains of her legs jumping when she is sitting in her chair. She is trying to be more active. She denies adverse effects with pramipexole. She denies falls. She continues treatment for anemia. She is followed by hematology with regular iron treatment.    HISTORY: (copied from Dr Trevor Mace note on 11/24/2017)  Interval history: propranolol is making her tired, will discontinue. Her hands are still better. But her chin tremor is ongoing. She is seeing orthopaedics for injections into CTS. She wears braces at night. She cannot tolerate more primidone. She would not like to try any medications for tremor, today we discussed Carpal Tunnel syndrome a new problem, etiology, diagnosis and therapy. Discussed emg/ncs  Interval history 10/09/2015: She is on 3 pills of primidone at night. The hand tremor is better. The chin tremor is still bad. Some sedation and feeling sluggish likely die to the primidone, discussed, cannot increase here. Discussed other medications and possibly a very low dose b-blocker. She is sleeping later in the morning. She goes to be at 11pm and 1130. She takes the pills at 10pm. She wakes up at 830. She feels sluggish. Will ask  her to take primidone a little earlier. Cannot increase at this time. Discussed other options, decided on propranolol very low dose and discussed side effects at length. Continue primidone at current dose.    Interval history:She is eating more and better. She is on more of a routine at the nursing home. She is on a whole pill of primidone at night. She does not feel that the primidone is helping her however she is only on 50 mg at night. Discussed that we may have to increase this medication more than not to see results. Propranolol the past helped but was discontinued due to side effects. Discussed with daughter and patient again.She denies any side effects at [name] to the primidone. Let daughter know that she doesn't have to wait several months if the medication is not working, they can call me and I'm happy to increase it. We'll plan on increasing the primidone very slowly to 100 mg at night. They understand that we may have to go up much more than that and that this may be limited by side effects. Can cause sedation in the middle of the night, advised patient to keep lights on, and if she has any side effects or any increased sedation that she should go back to the last dose of the primidone that was tolerated. Discussed other medications we could try including Neurontin, Keppra, Topamax for essential tremor. Propranolol and primidone are the most effective however Neurontin often works as well.  HPI: Lisa Riling  [name] is a 86 y.o. female here as a referral from Dr. Zachery Dauer for tremor. PMHx of CAP, asymptomatic carotid artery disease, Chronic venous insufficiency, Acute resp failure with hypoxia, HTN, dizziness, glucose intolerance, osteoporosis, benign essential tremor, anemia, allergic rhinitis,, HLD, glaucoma. She has had a tremor for years. Started 10 years ago in the right hand and then to the left hand then to the face. Worsening over the last year. No pain just very annoying. Continuous. It is  embarrassing. She was on a b-blocker and that was discontinued. She was dizzy on the b-blocker with hypotension. The beta blocker help the tremor, Worsened since stopping the b-blocker. The tremor is continuous. Her mohter had a tremor as well as her brother. Slowly progressive. Daughter is with her and also provides information.  Reviewed notes, labs and imaging from outside physicians, which showed:  Cbc with anemia (9.5/28.1), BMP unremarkable, B12 745, TSH 3.120  MRI of the brain 2005: MRI BRAIN WO/W CONTRAST 15 cc IV Omniscan. No comparison. There is moderate cortical atrophy. There are scattered small subcortical and periventricular white matter hyperintensities as well as a 1 cm hyperintensity in the left inferior temporal lobe. 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 There also is some minimal patchy hyperintensity in the pons bilaterally compatible with chronic ischemic change. Diffusion weighted imaging is negative for acute infarct. The enhancement pattern is normal and there is no mass. IMPRESSION Chronic small vessel ischemic changes. No acute abnormalities  Reviewed records from [facility] physicians. She also has dyslipidemia and impaired fasting glucose. Patient (solving [place]. Spent a good move for her. She enjoys activities in the company. She the dining hall twice a day is probably eating better but is also exercising more. She has a history of benign essential tremor primarily in her jaw. She was on a beta blocker without discontinued due to low blood pressure and becoming dizzy. He frequently clears her throat has a lot of sneezing or blowing her nose.    REVIEW OF SYSTEMS: Out of a complete 14 system review of symptoms, the patient complains only of the following symptoms, tremor, restless legs,  and [name] other reviewed systems are negative.  ALLERGIES: Allergies  Allergen Reactions  . Mycostatin [Nystatin] Swelling and Rash    HOME MEDICATIONS: Outpatient  Medications Prior to Visit  Medication Sig Dispense Refill  . acetaminophen (TYLENOL) 500 MG tablet Take 500-1,000 mg by mouth at bedtime.    Marland Kitchen aspirin EC 81 MG tablet Take 81 mg by mouth daily.    . calcium carbonate (OS-CAL) 600 MG TABS Take 600 mg by mouth daily.     . hydrocortisone 2.5 % ointment     . Multiple Vitamins-Minerals (MULTIVITAMIN WITH MINERALS) tablet Take 1 tablet by mouth daily.    . mupirocin ointment (BACTROBAN) 2 % APPLY TO AFFECTED AREA EVERY DAY    . OVER THE COUNTER MEDICATION Apply topically as needed. Cream for arthritic pain. Patient unable to recall the name at this time.    . raloxifene (EVISTA) 60 MG tablet Take 60 mg by mouth at bedtime.     . timolol (TIMOPTIC) 0.5 % ophthalmic solution Place 1 drop into both eyes daily.   1  . UNABLE TO FIND Med Name: Aranesp infusions every 3 weeks administered by [facility]    . VYTORIN 10-20 MG tablet Take 1 tablet by mouth daily.  0  . pramipexole (MIRAPEX) 0.25 MG tablet Take 0.25 mg by mouth at bedtime.    Marland Kitchen  primidone (MYSOLINE) 50 MG tablet Take 3 tablets (150 mg total) by mouth at bedtime. 270 tablet 3  . loratadine (CLARITIN) 10 MG tablet Take 10 mg by mouth daily as needed.     Marland Kitchen oxybutynin (DITROPAN) 5 MG tablet Take 2.5 mg by mouth 2 (two) times daily.  2   No facility-administered medications prior to visit.    PAST MEDICAL HISTORY: Past Medical History:  Diagnosis Date  . Anemia    sees Dr. Clelia Croft @ [facility]  . Arthritis   . Carotid artery occlusion   . Cataract   . Glaucoma   . High cholesterol   . Hypertension   . Osteoporosis   . Pneumonia July 03, 2014  . Syncope 03/15/2016  . UTI (urinary tract infection)     PAST SURGICAL HISTORY: Past Surgical History:  Procedure Laterality Date  . ABDOMINAL HYSTERECTOMY  1969  . APPENDECTOMY    . EYE SURGERY      FAMILY HISTORY: Family History  Problem Relation Age of Onset  . Hypertension Mother   . Heart disease  Mother   . Hyperlipidemia Brother   . Hypertension Brother     SOCIAL HISTORY: Social History   Socioeconomic History  . Marital status: Widowed    Spouse name: Not on file  . Number of children: 3  . Years of education: 41  . Highest education level: Bachelor's degree (e.g., BA, AB, BS)  Occupational History  . Occupation: retired Child psychotherapist  Tobacco Use  . Smoking status: Never Smoker  . Smokeless tobacco: Never Used  Vaping Use  . Vaping Use: Never used  Substance and Sexual Activity  . Alcohol use: Not Currently    Alcohol/week: 0.0 standard drinks    Comment: rare; about 3 sips every 2-3 months   . Drug use: No  . Sexual activity: Never  Other Topics Concern  . Not on file  Social History Narrative   Admitted to [facility] skill unit 03/10/16   Currently lives at [facility] independent living    Widowed   Never smoked   Living Will, POA   Social Determinants of Health   Financial Resource Strain:   . Difficulty of Paying Living Expenses: Not on file  Food Insecurity:   . Worried About Programme researcher, broadcasting/film/video in the Last Year: Not on file  . Ran Out of Food in the Last Year: Not on file  Transportation Needs:   . Lack of Transportation (Medical): Not on file  . Lack of Transportation (Non-Medical): Not on file  Physical Activity:   . Days of Exercise per Week: Not on file  . Minutes of Exercise per Session: Not on file  Stress:   . Feeling of Stress : Not on file  Social Connections:   . Frequency of Communication with Friends and Family: Not on file  . Frequency of Social Gatherings with Friends and Family: Not on file  . Attends Religious Services: Not on file  . Active Member of Clubs or Organizations: Not on file  . Attends Banker Meetings: Not on file  . Marital Status: Not on file  Intimate Partner Violence:   . Fear of Current or Ex-Partner: Not on file  . Emotionally Abused: Not on file  . Physically Abused: Not  on file  . Sexually Abused: Not on file      PHYSICAL EXAM  Vitals:   11/20/19 1453  BP: 131/65  Pulse: Marland Kitchen)  109  Weight: 139 lb (63 kg)  Height: 5' (1.524 m)   Body mass index is 27.15 kg/m.  Generalized: Well developed, in no acute distress  Cardiology: normal rate and rhythm, no murmur noted Respiratory: clear to auscultation bilaterally  Neurological examination  Mentation: Alert oriented to time, place, history taking. Follows [name] commands speech and language fluent Cranial nerve II-XII: Pupils were equal round reactive to light. Extraocular movements were full, visual field were full on confrontational test. Facial sensation and strength were normal. Head turning and shoulder shrug  were normal and symmetric. Motor: The motor testing reveals 5 over 5 strength of bilateral extremities, 4/5 bilateral lowers. Good symmetric motor tone is noted throughout. Tremor noted of head and bilateral hands, right> left  Sensory: Sensory testing is intact to soft touch on [name] 4 extremities. No evidence of extinction is noted.  Gait and station: Gait is stable with walker    DIAGNOSTIC DATA (LABS, IMAGING, TESTING) - I reviewed patient records, labs, notes, testing and imaging myself where available.  No flowsheet data found.   Lab Results  Component Value Date   WBC 8.8 11/15/2019   HGB 8.8 (L) 11/15/2019   HCT 27.2 (L) 11/15/2019   MCV 95.8 11/15/2019   PLT 273 11/15/2019      Component Value Date/Time   NA 140 05/10/2019 1437   NA 135 (A) 04/01/2016 0000   K 4.7 05/10/2019 1437   CL 104 05/10/2019 1437   CO2 29 05/10/2019 1437   GLUCOSE 116 (H) 05/10/2019 1437   BUN 21 05/10/2019 1437   BUN 19 04/01/2016 0000   CREATININE 0.84 05/10/2019 1437   CALCIUM 8.6 (L) 05/10/2019 1437   PROT 5.6 (L) 05/10/2019 1437   ALBUMIN 3.1 (L) 05/10/2019 1437   AST 18 05/10/2019 1437   ALT 16 05/10/2019 1437   ALKPHOS 75 05/10/2019 1437   BILITOT <0.2 (L) 05/10/2019 1437   GFRNONAA 60  (L) 05/10/2019 1437   GFRAA >60 05/10/2019 1437   No results found for: CHOL, HDL, LDLCALC, LDLDIRECT, TRIG, CHOLHDL No results found for: [id number] Lab Results  Component Value Date   VITAMINB12 427 10/05/2016   Lab Results  Component Value Date   TSH 0.666 03/07/2016       ASSESSMENT AND PLAN 86 y.o. year old female  has a past medical history of Anemia, Arthritis, Carotid artery occlusion, Cataract, Glaucoma, High cholesterol, Hypertension, Osteoporosis, Pneumonia (July 03, 2014), Syncope (03/15/2016), and UTI (urinary tract infection). here with     ICD-10-CM   1. Restless leg  G25.81   2. Essential tremor  G25.0 primidone (MYSOLINE) 50 MG tablet    Lisa [name] is doing well, overall. She feels tremors is stable on primidone. Can not tolerate increased doses due to sedation. She is concerned that RS has worsened over the past two years. She has tolerated pramipexole 0.25mg  at bedtime without adverse effects. We will increase dose to 0.25mg  twice daily. She was educated on possible side effects and will notify me if she has any concerns. She will continue primidone 150mg  at bedtime. She was encouraged to stay well hydrated. Healthy lifestyle habits encouraged. She will follow up in 6 months, sooner if needed. She and her daughter verbalize understanding and agreement with this plan.    No orders of the defined types were placed in this encounter.    Meds ordered this encounter  Medications  . pramipexole (MIRAPEX) 0.25 MG tablet    Sig: Take 1 tablet (0.25 mg total)  by mouth at bedtime.    Dispense:  180 tablet    Refill:  3    Order Specific Question:   Supervising Provider    Answer:   Anson Fret [id number]  . primidone (MYSOLINE) 50 MG tablet    Sig: Take 3 tablets (150 mg total) by mouth at bedtime.    Dispense:  270 tablet    Refill:  3    Order Specific Question:   Supervising Provider    Answer:   Anson Fret [id number]      I spent 30 minutes with the patient.  50% of this time was spent counseling and educating patient on plan of care and medications.    Shawnie Dapper, FNP-C 11/21/2019, 7:23 AM [facility] [address], [address] [place], [place] [address] [phone]  Made any corrections needed, and agree with history, physical, neuro exam,assessment and plan as stated.     Naomie Dean, MD [facility]

## 2019-11-20 NOTE — Patient Instructions (Signed)
We will continue primidone  at bedtime. I will increase pramipexole to 0.25mg  twice daily.   Follow up in 6 months, sooner if needed     Restless Legs Syndrome Restless legs syndrome is a condition that causes uncomfortable feelings or sensations in the legs, especially while sitting or lying down. The sensations usually cause an overwhelming urge to move the legs. The arms can also sometimes be affected. The condition can range from mild to severe. The symptoms often interfere with a person's ability to sleep. What are the causes? The cause of this condition is not known. What increases the risk? The following factors may make you more likely to develop this condition:  Being older than 50.  Pregnancy.  Being a woman. In general, the condition is more common in women than in men.  A family history of the condition.  Having iron deficiency.  Overuse of caffeine, nicotine, or alcohol.  Certain medical conditions, such as kidney disease, Parkinson's disease, or nerve damage.  Certain medicines, such as those for high blood pressure, nausea, colds, allergies, depression, and some heart conditions. What are the signs or symptoms? The main symptom of this condition is uncomfortable sensations in the legs, such as:  Pulling.  Tingling.  Prickling.  Throbbing.  Crawling.  Burning. Usually, the sensations:  Affect both sides of the body.  Are worse when you sit or lie down.  Are worse at night. These may wake you up or make it difficult to fall asleep.  Make you have a strong urge to move your legs.  Are temporarily relieved by moving your legs. The arms can also be affected, but this is rare. People who have this condition often have tiredness during the day because of their lack of sleep at night. How is this diagnosed? This condition may be diagnosed based on:  Your symptoms.  Blood tests. In some cases, you may be monitored in a sleep lab by a  specialist (a sleep study). This can detect any disruptions in your sleep. How is this treated? This condition is treated by managing the symptoms. This may include:  Lifestyle changes, such as exercising, using relaxation techniques, and avoiding caffeine, alcohol, or tobacco.  Medicines. Anti-seizure medicines may be tried first. Follow these instructions at home:     General instructions  Take over-the-counter and prescription medicines only as told by your health care provider.  Use methods to help relieve the uncomfortable sensations, such as: ? Massaging your legs. ? Walking or stretching. ? Taking a cold or hot bath.  Keep all follow-up visits as told by your health care provider. This is important. Lifestyle  Practice good sleep habits. For example, go to bed and get up at the same time every day. Most adults should get 7-9 hours of sleep each night.  Exercise regularly. Try to get at least 30 minutes of exercise most days of the week.  Practice ways of relaxing, such as yoga or meditation.  Avoid caffeine and alcohol.  Do not use any products that contain nicotine or tobacco, such as cigarettes and e-cigarettes. If you need help quitting, ask your health care provider. Contact a health care provider if:  Your symptoms get worse or they do not improve with treatment. Summary  Restless legs syndrome is a condition that causes uncomfortable feelings or sensations in the legs, especially while sitting or lying down.  The symptoms often interfere with a person's ability to sleep.  This condition is treated by managing  the symptoms. You may need to make lifestyle changes or take medicines. This information is not intended to replace advice given to you by your health care provider. Make sure you discuss any questions you have with your health care provider. Document Revised: 03/14/2017 Document Reviewed: 03/14/2017 Elsevier Patient Education  2020 Elsevier Inc.    Pramipexole tablets What is this medicine? PRAMIPEXOLE (pra mi PEX ole) is used to treat symptoms of Parkinson's disease. It is also used to treat Restless Legs Syndrome. This medicine may be used for other purposes; ask your health care provider or pharmacist if you have questions. COMMON BRAND NAME(S): Mirapex What should I tell my health care provider before I take this medicine? They need to know if you have any of these conditions:  heart disease  if you often drink alcohol  kidney disease  low blood pressure  mental illness  narcolepsy  on hemodialysis  sleep apnea  an unusual or allergic reaction to pramipexole, other medicines, foods, dyes, or preservatives  pregnant or trying to get pregnant  breast-feeding How should I use this medicine? Take this medicine by mouth with a glass of water. Follow the directions on the prescription label. Take with food. Take your doses at regular intervals. Do not stop taking this medicine suddenly except upon the advice of your doctor. Stopping this medicine too quickly may cause side effects or your condition may worsen. Talk to your pediatrician regarding the use of this medicine in children. Special care may be needed. Overdosage: If you think you have taken too much of this medicine contact a poison control center or emergency room at once. NOTE: This medicine is only for you. Do not share this medicine with others. What if I miss a dose? If you miss a dose, take it as soon as you can. If it is almost time for your next dose, take only that dose. Do not take double or extra doses. What may interact with this medicine?  alcohol  antihistamines for allergy, cough and cold  certain medicines for depression, anxiety, or psychotic disturbances  certain medicines for seizures like phenobarbital, primidone  certain medicines for sleep  general anesthetics like halothane, isoflurane, methoxyflurane, propofol  medicines for  blood pressure  medicines that relax muscles for surgery  metoclopramide  narcotic medicines for pain This list may not describe all possible interactions. Give your health care provider a list of all the medicines, herbs, non-prescription drugs, or dietary supplements you use. Also tell them if you smoke, drink alcohol, or use illegal drugs. Some items may interact with your medicine. What should I watch for while using this medicine? Visit your health care professional for regular checks on your progress. Tell your health care professional if your symptoms do not start to get better or if they get worse. Do not stop taking except on your health care professional's advice. You may develop a severe reaction. Your health care professional will tell you how much medicine to take. You may get drowsy or dizzy. Do not drive, use machinery, or do anything that needs mental alertness until you know how this drug affects you. Do not stand or sit up quickly, especially if you are an older patient. This reduces the risk of dizzy or fainting spells. Alcohol may interfere with the effect of this medicine. Avoid alcoholic drinks. Your mouth may get dry. Chewing sugarless gum or sucking hard candy and drinking plenty of water may help. Contact your health care professional if the problem  does not go away or is severe. When taking this medicine, you may fall asleep without notice. You may be doing activities like driving a car, talking, or eating. You may not feel drowsy before it happens. Contact your health care provider right away if this happens to you. There have been reports of increased sexual urges or other strong urges such as gambling while taking this medicine. If you experience any of these while taking this medicine, you should report this to your health care provider as soon as possible. Talk with your doctor if you have posture changes you cannot control. These may include your neck bending forward,  your spine bending forward at the waist, or tilting sideways when you sit, stand, or walk. What side effects may I notice from receiving this medicine? Side effects that you should report to your doctor or health care professional as soon as possible:  allergic reactions like skin rash, itching or hives, swelling of the face, lips, or tongue  changes in emotions or moods  changes in vision  confusion  falling asleep during normal activities like driving  hallucinations  involuntary muscle contractions; difficulty swallowing; difficulty walking  new or increased gambling urges, sexual urges, uncontrolled spending, binge or compulsive eating, or other urges  new or worsening curve in the spine  signs and symptoms of low blood pressure like dizziness; feeling faint or lightheaded; falls; unusually weak or tired  uncontrollable movements of the arms, face, head, mouth, neck, or upper body Side effects that usually do not require medical attention (report to your doctor or health care professional if they continue or are bothersome):  constipation  drowsiness  dry mouth  nausea  trouble sleeping This list may not describe all possible side effects. Call your doctor for medical advice about side effects. You may report side effects to FDA at 1-800-FDA-1088. Where should I keep my medicine? Keep out of the reach of children. Store at room temperature between 15 and 30 degrees C (59 and 86 degrees F). Protect from light. Throw away any unused medicine after the expiration date. NOTE: This sheet is a summary. It may not cover all possible information. If you have questions about this medicine, talk to your doctor, pharmacist, or health care provider.  2020 Elsevier/Gold Standard (2018-10-30 20:05:16)   Primidone tablets What is this medicine? PRIMIDONE (PRI mi done) is a barbiturate. This medicine is used to control seizures in certain types of epilepsy. It is not for use in  absence (petit mal) seizures. This medicine may be used for other purposes; ask your health care provider or pharmacist if you have questions. COMMON BRAND NAME(S): Mysoline What should I tell my health care provider before I take this medicine? They need to know if you have any of these conditions:  kidney disease  liver disease  porphyria  suicidal thoughts, plans, or attempt; a previous suicide attempt by you or a family member  an unusual or allergic reaction to primidone, phenobarbital, other barbiturates or seizure medications, other medicines, foods, dyes, or preservatives  pregnant or trying to get pregnant  breast-feeding How should I use this medicine? Take this medicine by mouth with a glass of water. Follow the directions on the prescription label. Take your doses at regular intervals. Do not take your medicine more often than directed. Do not stop taking except on the advice of your doctor or health care professional. A special MedGuide will be given to you by the pharmacist with each prescription and  refill. Be sure to read this information carefully each time. Contact your pediatrician or health care professional regarding the use of this medication in children. Special care may be needed. While this drug may be prescribed for children for selected conditions, precautions do apply. Overdosage: If you think you have taken too much of this medicine contact a poison control center or emergency room at once. NOTE: This medicine is only for you. Do not share this medicine with others. What if I miss a dose? If you miss a dose, take it as soon as you can. If it is almost time for your next dose, take only that dose. Do not take double or extra doses. What may interact with this medicine? Do not take this medicine with any of the following medications:  voriconazole This medicine may also interact with the following medications:  cancer-treating medications  cyclosporine   disopyramide  doxycycline  female hormones, including contraceptive or birth control pills  medicines for mental depression, anxiety or other mood problems  medicines for treating HIV infection or AIDS  modafinil  prescription pain medications  quinidine  warfarin This list may not describe all possible interactions. Give your health care provider a list of all the medicines, herbs, non-prescription drugs, or dietary supplements you use. Also tell them if you smoke, drink alcohol, or use illegal drugs. Some items may interact with your medicine. What should I watch for while using this medicine? Visit your doctor or health care professional for regular checks on your progress. It may be 2 to 3 weeks before you see the full effects of this medicine. Do not suddenly stop taking this medicine, you may increase the risk of seizures. Your doctor or health care professional may want to gradually reduce the dose. Wear a medical identification bracelet or chain to say you have epilepsy, and carry a card that lists all your medications. You may get drowsy or dizzy. Do not drive, use machinery, or do anything that needs mental alertness until you know how this medicine affects you. Do not stand or sit up quickly, especially if you are an older patient. This reduces the risk of dizzy or fainting spells. Alcohol may interfere with the effect of this medicine. Avoid alcoholic drinks. Birth control pills may not work properly while you are taking this medicine. Talk to your doctor about using an extra method of birth control. The use of this medicine may increase the chance of suicidal thoughts or actions. Pay special attention to how you are responding while on this medicine. Any worsening of mood, or thoughts of suicide or dying should be reported to your health care professional right away. Women who become pregnant while using this medicine may enroll in the [place] [employer] Antiepileptic Drug Pregnancy  Registry by calling [phone]. This registry collects information about the safety of antiepileptic drug use during pregnancy. This medicine may cause a decrease in vitamin D and folic acid. You should make sure that you get enough vitamins while you are taking this medicine. Discuss the foods you eat and the vitamins you take with your health care professional. What side effects may I notice from receiving this medicine? Side effects that you should report to your doctor or health care professional as soon as possible:  allergic reactions like skin rash, itching or hives, swelling of the face, lips, or tongue  blurred, double vision, or uncontrollable rolling or movements of the eyes  redness, blistering, peeling or loosening of the skin, including inside  the mouth  shortness of breath or difficulty breathing  unusual excitement or restlessness, more likely in children and the elderly  unusually weak or tired  worsening of mood, thoughts or actions of suicide or dying Side effects that usually do not require medical attention (report to your doctor or health care professional if they continue or are bothersome):  clumsiness, unsteadiness, or a hang-over effect  decreased sexual ability  dizziness, drowsiness  loss of appetite  nausea or vomiting This list may not describe all possible side effects. Call your doctor for medical advice about side effects. You may report side effects to FDA at 1-800-FDA-1088. Where should I keep my medicine? Keep out of the reach of children. This medicine may cause accidental overdose and death if it taken by other adults, children, or pets. Mix any unused medicine with a substance like cat litter or coffee grounds. Then throw the medicine away in a sealed container like a sealed bag or a coffee can with a lid. Do not use the medicine after the expiration date. Store at room temperature between 15 and 30 degrees C (59 and 86 degrees F). NOTE:  This sheet is a summary. It may not cover all possible information. If you have questions about this medicine, talk to your doctor, pharmacist, or health care provider.  2020 Elsevier/Gold Standard (2016-10-06 15:21:47)

## 2019-11-21 ENCOUNTER — Encounter: Payer: Self-pay | Admitting: Family Medicine

## 2019-11-22 ENCOUNTER — Telehealth: Payer: Self-pay | Admitting: Family Medicine

## 2019-11-22 ENCOUNTER — Other Ambulatory Visit: Payer: Self-pay | Admitting: *Deleted

## 2019-11-22 MED ORDER — PRAMIPEXOLE DIHYDROCHLORIDE 0.25 MG PO TABS
0.2500 mg | ORAL_TABLET | Freq: Two times a day (BID) | ORAL | 3 refills | Status: DC
Start: 2019-11-22 — End: 2020-07-23

## 2019-11-22 NOTE — Telephone Encounter (Signed)
Pt's daughter, Thayer Dallas called, Pharmacy processing prescription had only 1 tablet per day for pramipexole (MIRAPEX) 0.25 MG tablet. Pharmacy said they would communication with [facility] 2 tablet per day. Prescription dosage. Would like a call from the nurse today.

## 2019-11-22 NOTE — Telephone Encounter (Signed)
Spoke to daughter, Lisa [name] and I relayed that new prescription with correct instructions were placed for the mirapex 0.25mg  po bid #[phone] refills at [facility] at 1248. She verbalized understanding. Will pick up tomorrow.

## 2019-11-27 ENCOUNTER — Telehealth: Payer: Self-pay

## 2019-11-27 NOTE — Telephone Encounter (Signed)
Patient's daughter called questioning if patient should have flu shot before she has her aranesp shot. Advised Dr Clelia Croft is out of the office and I would send him a message. Patient's daughter states she will hold out on flu shot until Dr Clelia Croft is back in office. Also questioning timing of flu shot, aranesp and covid boast shot. States that the covid boast is supposed to be in two weeks but nothing is set as of now. Sent FYI to Dr Clelia Croft. States she will ask when she is seen in office next week.

## 2019-11-29 ENCOUNTER — Telehealth: Payer: Self-pay

## 2019-11-29 NOTE — Telephone Encounter (Signed)
-----   Message from Lorayne Marek, RN sent at 11/29/2019  9:20 AM EDT ----- Selena Batten, I am not sure how Dr. Clelia Croft feels about the flu shot and COVID shot proximity but pharmacy says it does not matter how far apart the flu shot and COVID booster are given.  In fact, they can be given on the same day.  As far as getting her flu shot the day of her Aranesp, that should also not be an issue.  She can get her flu shot anytime here.  Lorayne Marek, RN ----- Message ----- From: Jonny Ruiz, LPN Sent: 4/48/1856  12:22 PM EDT To: Benjiman Core, MD, [facility]  FYI Patient is wanting to have FLU shot. Patient is questioning how much time needs to between the FLU shot and her Aranesp. And patient assisted living facility may be giving a boaster COVID shot, she wants to know how long she should wait to do the boaster with getting the Flu shot and her aranesp. Will ask about this at appointment. Thank you.    Kim LPN

## 2019-11-29 NOTE — Telephone Encounter (Signed)
Call patient's daughter and left a message per pharmacy guidance with spacing of giving vaccines. Advised patient's daughter to reach out to the office with any further questions.

## 2019-12-05 ENCOUNTER — Inpatient Hospital Stay (HOSPITAL_BASED_OUTPATIENT_CLINIC_OR_DEPARTMENT_OTHER): Payer: Medicare Other | Admitting: Oncology

## 2019-12-05 ENCOUNTER — Other Ambulatory Visit: Payer: Self-pay

## 2019-12-05 ENCOUNTER — Telehealth: Payer: Self-pay | Admitting: Oncology

## 2019-12-05 ENCOUNTER — Inpatient Hospital Stay: Payer: Medicare Other

## 2019-12-05 VITALS — BP 141/68 | HR 109 | Temp 99.5°F | Resp 20

## 2019-12-05 DIAGNOSIS — D509 Iron deficiency anemia, unspecified: Secondary | ICD-10-CM

## 2019-12-05 DIAGNOSIS — D631 Anemia in chronic kidney disease: Secondary | ICD-10-CM

## 2019-12-05 DIAGNOSIS — N189 Chronic kidney disease, unspecified: Secondary | ICD-10-CM

## 2019-12-05 DIAGNOSIS — D649 Anemia, unspecified: Secondary | ICD-10-CM | POA: Diagnosis not present

## 2019-12-05 DIAGNOSIS — Z23 Encounter for immunization: Secondary | ICD-10-CM

## 2019-12-05 DIAGNOSIS — Z79899 Other long term (current) drug therapy: Secondary | ICD-10-CM | POA: Diagnosis not present

## 2019-12-05 DIAGNOSIS — I129 Hypertensive chronic kidney disease with stage 1 through stage 4 chronic kidney disease, or unspecified chronic kidney disease: Secondary | ICD-10-CM | POA: Diagnosis not present

## 2019-12-05 LAB — CBC WITH DIFFERENTIAL (CANCER CENTER ONLY)
Abs Immature Granulocytes: 0.05 10*3/uL (ref 0.00–0.07)
Basophils Absolute: 0 10*3/uL (ref 0.0–0.1)
Basophils Relative: 0 %
Eosinophils Absolute: 0.1 10*3/uL (ref 0.0–0.5)
Eosinophils Relative: 1 %
HCT: 23.9 % — ABNORMAL LOW (ref 36.0–46.0)
Hemoglobin: 7.4 g/dL — ABNORMAL LOW (ref 12.0–15.0)
Immature Granulocytes: 1 %
Lymphocytes Relative: 8 %
Lymphs Abs: 0.7 10*3/uL (ref 0.7–4.0)
MCH: 28.6 pg (ref 26.0–34.0)
MCHC: 31 g/dL (ref 30.0–36.0)
MCV: 92.3 fL (ref 80.0–100.0)
Monocytes Absolute: 0.9 10*3/uL (ref 0.1–1.0)
Monocytes Relative: 10 %
Neutro Abs: 7.6 10*3/uL (ref 1.7–7.7)
Neutrophils Relative %: 80 %
Platelet Count: 323 10*3/uL (ref 150–400)
RBC: 2.59 MIL/uL — ABNORMAL LOW (ref 3.87–5.11)
RDW: 16.9 % — ABNORMAL HIGH (ref 11.5–15.5)
WBC Count: 9.3 10*3/uL (ref 4.0–10.5)
nRBC: 0 % (ref 0.0–0.2)

## 2019-12-05 LAB — PREPARE RBC (CROSSMATCH)

## 2019-12-05 LAB — SAMPLE TO BLOOD BANK

## 2019-12-05 MED ORDER — INFLUENZA VAC SPLIT QUAD 0.5 ML IM SUSY
PREFILLED_SYRINGE | INTRAMUSCULAR | Status: AC
  Filled 2019-12-05: qty 0.5

## 2019-12-05 MED ORDER — DARBEPOETIN ALFA 300 MCG/0.6ML IJ SOSY
PREFILLED_SYRINGE | INTRAMUSCULAR | Status: AC
  Filled 2019-12-05: qty 0.6

## 2019-12-05 MED ORDER — DARBEPOETIN ALFA 300 MCG/0.6ML IJ SOSY
300.0000 ug | PREFILLED_SYRINGE | Freq: Once | INTRAMUSCULAR | Status: AC
  Administered 2019-12-05: 300 ug via SUBCUTANEOUS

## 2019-12-05 MED ORDER — INFLUENZA VAC SPLIT QUAD 0.5 ML IM SUSY
0.5000 mL | PREFILLED_SYRINGE | Freq: Once | INTRAMUSCULAR | Status: AC
  Administered 2019-12-05: 0.5 mL via INTRAMUSCULAR

## 2019-12-05 NOTE — Progress Notes (Signed)
Spoke to [place] in blood bank to confirm orders for blood and platelets for transfusion tomorrow received. Patient is aware not to remove blue blood bank bracelet.

## 2019-12-05 NOTE — Progress Notes (Signed)
Hematology and Oncology Follow Up Visit  Lisa [name] [id number] [birth year] 86 y.o. 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     Prior Therapy: IV iron infusion infusion as well as packed red cell transfusion.  Current therapy:   Aranesp 300 mcg every 3 weeks to keep her hemoglobin above 10.   Feraheme at 1000 mg every 6 weeks.  Packed red cell transfusion for hemoglobin below 8.  Interim History: Lisa [name] is here for return evaluation.  Since the last visit, she reports no major changes in her health.  He did report slight decline in her appetite but overall energy and performance status remained stable.  She does use a walker without any recent falls or syncope.  She denies any recent hospitalization or illnesses.                Medications: Unchanged on review. Current Outpatient Medications  Medication Sig Dispense Refill  . acetaminophen (TYLENOL) 500 MG tablet Take 500-1,000 mg by mouth at bedtime.    Marland Kitchen aspirin EC 81 MG tablet Take 81 mg by mouth daily.    . calcium carbonate (OS-CAL) 600 MG TABS Take 600 mg by mouth daily.     . hydrocortisone 2.5 % ointment     . Multiple Vitamins-Minerals (MULTIVITAMIN WITH MINERALS) tablet Take 1 tablet by mouth daily.    . mupirocin ointment (BACTROBAN) 2 % APPLY TO AFFECTED AREA EVERY DAY    . OVER THE COUNTER MEDICATION Apply topically as needed. Cream for arthritic pain. Patient unable to recall the name at this time.    . pramipexole (MIRAPEX) 0.25 MG tablet Take 1 tablet (0.25 mg total) by mouth in the morning and at bedtime. 180 tablet 3  . primidone (MYSOLINE) 50 MG tablet Take 3 tablets (150 mg total) by mouth at bedtime. 270 tablet 3  . raloxifene (EVISTA) 60 MG tablet Take 60 mg by mouth at bedtime.     . timolol (TIMOPTIC) 0.5 % ophthalmic solution Place  1 drop into both eyes daily.   1  . UNABLE TO FIND Med Name: Aranesp infusions every 3 weeks administered by [facility]    . VYTORIN 10-20 MG tablet Take 1 tablet by mouth daily.  0   No current facility-administered medications for this visit.     Allergies:  Allergies  Allergen Reactions  . Mycostatin [Nystatin] Swelling and Rash      Physical Exam:     Blood pressure (!) 141/68, pulse (!) 109, temperature 99.5 F (37.5 C), temperature source Oral, resp. rate 20, SpO2 98 %.        ECOG: 1   General appearance: Alert, awake without any distress. Head: Atraumatic without abnormalities Oropharynx: Without any thrush or ulcers. Eyes: No scleral icterus. Lymph nodes: No lymphadenopathy noted in the cervical, supraclavicular, or axillary nodes Heart:regular rate and rhythm, without any murmurs or gallops.   Lung: Clear to auscultation without any rhonchi, wheezes or dullness to percussion. Abdomin: Soft, nontender without any shifting dullness or ascites. Musculoskeletal: No clubbing or cyanosis. Neurological: No motor or sensory deficits. Skin: No rashes or lesions.           Lab Results: Lab Results  Component Value Date   WBC 8.8 11/15/2019   HGB 8.8 (L) 11/15/2019   HCT 27.2 (L) 11/15/2019   MCV 95.8 11/15/2019   PLT  273 11/15/2019     Chemistry      Component Value Date/Time   NA 140 05/10/2019 1437   NA 135 (A) 04/01/2016 0000   K 4.7 05/10/2019 1437   CL 104 05/10/2019 1437   CO2 29 05/10/2019 1437   BUN 21 05/10/2019 1437   BUN 19 04/01/2016 0000   CREATININE 0.84 05/10/2019 1437   GLU 102 04/01/2016 0000      Component Value Date/Time   CALCIUM 8.6 (L) 05/10/2019 1437   ALKPHOS 75 05/10/2019 1437   AST 18 05/10/2019 1437   ALT 16 05/10/2019 1437   BILITOT <0.2 (L) 05/10/2019 1437       Impression and Plan:  86 year old woman with:   1.  Anemia diagnosed in 2018.  The differential diagnosis includes iron  deficiency as well as chronic renal sufficiency and possibly myelodysplasia.    Her hemoglobin today is down to 7.4 and would benefit for 1 unit of packed red cell transfusion.  Risks and benefits of transfusion were discussed today she is agreeable to proceed.   2.  Iron deficiency: She is currently receiving intravenous iron infusion intermittently with the last treatment given in July 2021.  Risks and benefits of repeat intravenous iron were reviewed.  These will include arthralgias, myalgias as well as infusion related complications.  Iron level on September 9 was down to 28 with ferritin of 54.  He is agreeable to proceed and will receive 1 iron infusion on 9/30 and repeated the following week.  3.  Follow-up: Every 3 weeks for Aranesp and MD follow-up in 9 weeks..   30 minutes were spent on this visit.  The time was dedicated to reviewing her disease status, discussing treatment options and future plan of care review.      Lisa Hose, MD 9/29/20212:34 PM

## 2019-12-05 NOTE — Patient Instructions (Signed)
Influenza, Adult Influenza is also called "the flu." It is an infection in the lungs, nose, and throat (respiratory tract). It is caused by a virus. The flu causes symptoms that are similar to symptoms of a cold. It also causes a high fever and body aches. The flu spreads easily from person to person (is contagious). Getting a flu shot (influenza vaccination) every year is the best way to prevent the flu. What are the causes? This condition is caused by the influenza virus. You can get the virus by:  Breathing in droplets that are in the air from the cough or sneeze of a person who has the virus.  Touching something that has the virus on it (is contaminated) and then touching your mouth, nose, or eyes. What increases the risk? Certain things may make you more likely to get the flu. These include:  Not washing your hands often.  Having close contact with many people during cold and flu season.  Touching your mouth, eyes, or nose without first washing your hands.  Not getting a flu shot every year. You may have a higher risk for the flu, along with serious problems such as a lung infection (pneumonia), if you:  Are older than 65.  Are pregnant.  Have a weakened disease-fighting system (immune system) because of a disease or taking certain medicines.  Have a long-term (chronic) illness, such as: ? Heart, kidney, or lung disease. ? Diabetes. ? Asthma.  Have a liver disorder.  Are very overweight (morbidly obese).  Have anemia. This is a condition that affects your red blood cells. What are the signs or symptoms? Symptoms usually begin suddenly and last 4-14 days. They may include:  Fever and chills.  Headaches, body aches, or muscle aches.  Sore throat.  Cough.  Runny or stuffy (congested) nose.  Chest discomfort.  Not wanting to eat as much as normal (poor appetite).  Weakness or feeling tired (fatigue).  Dizziness.  Feeling sick to your stomach (nauseous) or  throwing up (vomiting). How is this treated? If the flu is found early, you can be treated with medicine that can help reduce how bad the illness is and how long it lasts (antiviral medicine). This may be given by mouth (orally) or through an IV tube. Taking care of yourself at home can help your symptoms get better. Your doctor may suggest:  Taking over-the-counter medicines.  Drinking plenty of fluids. The flu often goes away on its own. If you have very bad symptoms or other problems, you may be treated in a hospital. Follow these instructions at home:     Activity  Rest as needed. Get plenty of sleep.  Stay home from work or school as told by your doctor. ? Do not leave home until you do not have a fever for 24 hours without taking medicine. ? Leave home only to visit your doctor. Eating and drinking  Take an ORS (oral rehydration solution). This is a drink that is sold at pharmacies and stores.  Drink enough fluid to keep your pee (urine) pale yellow.  Drink clear fluids in small amounts as you are able. Clear fluids include: ? Water. ? Ice chips. ? Fruit juice that has water added (diluted fruit juice). ? Low-calorie sports drinks.  Eat bland, easy-to-digest foods in small amounts as you are able. These foods include: ? Bananas. ? Applesauce. ? Rice. ? Lean meats. ? Toast. ? Crackers.  Do not eat or drink: ? Fluids that have a lot   of sugar or caffeine. ? Alcohol. ? Spicy or fatty foods. General instructions  Take over-the-counter and prescription medicines only as told by your doctor.  Use a cool mist humidifier to add moisture to the air in your home. This can make it easier for you to breathe.  Cover your mouth and nose when you cough or sneeze.  Wash your hands with soap and water often, especially after you cough or sneeze. If you cannot use soap and water, use alcohol-based hand sanitizer.  Keep all follow-up visits as told by your doctor. This is  important. How is this prevented?   Get a flu shot every year. You may get the flu shot in late summer, fall, or winter. Ask your doctor when you should get your flu shot.  Avoid contact with people who are sick during fall and winter (cold and flu season). Contact a doctor if:  You get new symptoms.  You have: ? Chest pain. ? Watery poop (diarrhea). ? A fever.  Your cough gets worse.  You start to have more mucus.  You feel sick to your stomach.  You throw up. Get help right away if you:  Have shortness of breath.  Have trouble breathing.  Have skin or nails that turn a bluish color.  Have very bad pain or stiffness in your neck.  Get a sudden headache.  Get sudden pain in your face or ear.  Cannot eat or drink without throwing up. Summary  Influenza ("the flu") is an infection in the lungs, nose, and throat. It is caused by a virus.  Take over-the-counter and prescription medicines only as told by your doctor.  Getting a flu shot every year is the best way to avoid getting the flu. This information is not intended to replace advice given to you by your health care provider. Make sure you discuss any questions you have with your health care provider. Document Revised: 08/10/2017 Document Reviewed: 08/10/2017 Elsevier Patient Education  2020 Else[employer]  Darbepoetin Alfa injection What is this medicine? DARBEPOETIN ALFA (dar be POE e tin AL fa) helps your body make more red blood cells. It is used to treat anemia caused by chronic kidney failure and chemotherapy. This medicine may be used for other purposes; ask your health care provider or pharmacist if you have questions. COMMON BRAND NAME(S): Aranesp What should I tell my health care provider before I take this medicine? They need to know if you have any of these conditions:  blood clotting disorders or history of blood clots  cancer patient not on chemotherapy  cystic fibrosis  heart disease, such  as angina, heart failure, or a history of a heart attack  hemoglobin level of 12 g/dL or greater  high blood pressure  low levels of folate, iron, or vitamin B12  seizures  an unusual or allergic reaction to darbepoetin, erythropoietin, albumin, hamster proteins, latex, other medicines, foods, dyes, or preservatives  pregnant or trying to get pregnant  breast-feeding How should I use this medicine? This medicine is for injection into a vein or under the skin. It is usually given by a health care professional in a hospital or clinic setting. If you get this medicine at home, you will be taught how to prepare and give this medicine. Use exactly as directed. Take your medicine at regular intervals. Do not take your medicine more often than directed. It is important that you put your used needles and syringes in a special sharps container. Do not  put them in a trash can. If you do not have a sharps container, call your pharmacist or healthcare provider to get one. A special MedGuide will be given to you by the pharmacist with each prescription and refill. Be sure to read this information carefully each time. Talk to your pediatrician regarding the use of this medicine in children. While this medicine may be used in children as young as 1 month of age for selected conditions, precautions do apply. Overdosage: If you think you have taken too much of this medicine contact a poison control center or emergency room at once. NOTE: This medicine is only for you. Do not share this medicine with others. What if I miss a dose? If you miss a dose, take it as soon as you can. If it is almost time for your next dose, take only that dose. Do not take double or extra doses. What may interact with this medicine? Do not take this medicine with any of the following medications:  epoetin alfa This list may not describe all possible interactions. Give your health care provider a list of all the medicines,  herbs, non-prescription drugs, or dietary supplements you use. Also tell them if you smoke, drink alcohol, or use illegal drugs. Some items may interact with your medicine. What should I watch for while using this medicine? Your condition will be monitored carefully while you are receiving this medicine. You may need blood work done while you are taking this medicine. This medicine may cause a decrease in vitamin B6. You should make sure that you get enough vitamin B6 while you are taking this medicine. Discuss the foods you eat and the vitamins you take with your health care professional. What side effects may I notice from receiving this medicine? Side effects that you should report to your doctor or health care professional as soon as possible:  allergic reactions like skin rash, itching or hives, swelling of the face, lips, or tongue  breathing problems  changes in vision  chest pain  confusion, trouble speaking or understanding  feeling faint or lightheaded, falls  high blood pressure  muscle aches or pains  pain, swelling, warmth in the leg  rapid weight gain  severe headaches  sudden numbness or weakness of the face, arm or leg  trouble walking, dizziness, loss of balance or coordination  seizures (convulsions)  swelling of the ankles, feet, hands  unusually weak or tired Side effects that usually do not require medical attention (report to your doctor or health care professional if they continue or are bothersome):  diarrhea  fever, chills (flu-like symptoms)  headaches  nausea, vomiting  redness, stinging, or swelling at site where injected This list may not describe all possible side effects. Call your doctor for medical advice about side effects. You may report side effects to FDA at 1-800-FDA-1088. Where should I keep my medicine? Keep out of the reach of children. Store in a refrigerator between 2 and 8 degrees C (36 and 46 degrees F). Do not freeze.  Do not shake. Throw away any unused portion if using a single-dose vial. Throw away any unused medicine after the expiration date. NOTE: This sheet is a summary. It may not cover all possible information. If you have questions about this medicine, talk to your doctor, pharmacist, or health care provider.  2020 Elsevier/Gold Standard (2017-03-09 16:44:20)

## 2019-12-05 NOTE — Telephone Encounter (Signed)
Scheduled per 9/29 los. Printed avs and calendar for pt.  

## 2019-12-06 ENCOUNTER — Inpatient Hospital Stay: Payer: Medicare Other

## 2019-12-06 ENCOUNTER — Other Ambulatory Visit: Payer: Self-pay

## 2019-12-06 VITALS — BP 140/60 | HR 92 | Temp 98.6°F | Resp 20

## 2019-12-06 DIAGNOSIS — N189 Chronic kidney disease, unspecified: Secondary | ICD-10-CM

## 2019-12-06 DIAGNOSIS — I129 Hypertensive chronic kidney disease with stage 1 through stage 4 chronic kidney disease, or unspecified chronic kidney disease: Secondary | ICD-10-CM | POA: Diagnosis not present

## 2019-12-06 DIAGNOSIS — Z23 Encounter for immunization: Secondary | ICD-10-CM | POA: Diagnosis not present

## 2019-12-06 DIAGNOSIS — Z79899 Other long term (current) drug therapy: Secondary | ICD-10-CM | POA: Diagnosis not present

## 2019-12-06 DIAGNOSIS — D649 Anemia, unspecified: Secondary | ICD-10-CM

## 2019-12-06 DIAGNOSIS — D631 Anemia in chronic kidney disease: Secondary | ICD-10-CM

## 2019-12-06 DIAGNOSIS — D509 Iron deficiency anemia, unspecified: Secondary | ICD-10-CM

## 2019-12-06 LAB — PREPARE PLATELET PHERESIS: Unit division: 0

## 2019-12-06 LAB — FERRITIN: Ferritin: 20 ng/mL (ref 11–307)

## 2019-12-06 LAB — BPAM PLATELET PHERESIS
Blood Product Expiration Date: 202110012359
Unit Type and Rh: 7300

## 2019-12-06 LAB — IRON AND TIBC
Iron: 21 ug/dL — ABNORMAL LOW (ref 41–142)
Saturation Ratios: 10 % — ABNORMAL LOW (ref 21–57)
TIBC: 218 ug/dL — ABNORMAL LOW (ref 236–444)
UIBC: 196 ug/dL (ref 120–384)

## 2019-12-06 MED ORDER — SODIUM CHLORIDE 0.9% IV SOLUTION
250.0000 mL | Freq: Once | INTRAVENOUS | Status: AC
  Administered 2019-12-06: 250 mL via INTRAVENOUS
  Filled 2019-12-06: qty 250

## 2019-12-06 MED ORDER — SODIUM CHLORIDE 0.9 % IV SOLN
Freq: Once | INTRAVENOUS | Status: AC
  Filled 2019-12-06: qty 250

## 2019-12-06 MED ORDER — ACETAMINOPHEN 325 MG PO TABS
ORAL_TABLET | ORAL | Status: AC
  Filled 2019-12-06: qty 2

## 2019-12-06 MED ORDER — ACETAMINOPHEN 325 MG PO TABS
650.0000 mg | ORAL_TABLET | Freq: Once | ORAL | Status: AC
  Administered 2019-12-06: 650 mg via ORAL

## 2019-12-06 MED ORDER — SODIUM CHLORIDE 0.9 % IV SOLN
510.0000 mg | Freq: Once | INTRAVENOUS | Status: AC
  Administered 2019-12-06: 510 mg via INTRAVENOUS
  Filled 2019-12-06: qty 510

## 2019-12-06 MED ORDER — DIPHENHYDRAMINE HCL 25 MG PO CAPS
25.0000 mg | ORAL_CAPSULE | Freq: Once | ORAL | Status: AC
  Administered 2019-12-06: 25 mg via ORAL

## 2019-12-06 MED ORDER — DIPHENHYDRAMINE HCL 25 MG PO CAPS
ORAL_CAPSULE | ORAL | Status: AC
  Filled 2019-12-06: qty 1

## 2019-12-06 NOTE — Patient Instructions (Signed)
Ferumoxytol injection What is this medicine? FERUMOXYTOL is an iron complex. Iron is used to make healthy red blood cells, which carry oxygen and nutrients throughout the body. This medicine is used to treat iron deficiency anemia. This medicine may be used for other purposes; ask your health care provider or pharmacist if you have questions. COMMON BRAND NAME(S): Feraheme What should I tell my health care provider before I take this medicine? They need to know if you have any of these conditions:  anemia not caused by low iron levels  high levels of iron in the blood  magnetic resonance imaging (MRI) test scheduled  an unusual or allergic reaction to iron, other medicines, foods, dyes, or preservatives  pregnant or trying to get pregnant  breast-feeding How should I use this medicine? This medicine is for injection into a vein. It is given by a health care professional in a hospital or clinic setting. Talk to your pediatrician regarding the use of this medicine in children. Special care may be needed. Overdosage: If you think you have taken too much of this medicine contact a poison control center or emergency room at once. NOTE: This medicine is only for you. Do not share this medicine with others. What if I miss a dose? It is important not to miss your dose. Call your doctor or health care professional if you are unable to keep an appointment. What may interact with this medicine? This medicine may interact with the following medications:  other iron products This list may not describe all possible interactions. Give your health care provider a list of all the medicines, herbs, non-prescription drugs, or dietary supplements you use. Also tell them if you smoke, drink alcohol, or use illegal drugs. Some items may interact with your medicine. What should I watch for while using this medicine? Visit your doctor or healthcare professional regularly. Tell your doctor or healthcare  professional if your symptoms do not start to get better or if they get worse. You may need blood work done while you are taking this medicine. You may need to follow a special diet. Talk to your doctor. Foods that contain iron include: whole grains/cereals, dried fruits, beans, or peas, leafy green vegetables, and organ meats (liver, kidney). What side effects may I notice from receiving this medicine? Side effects that you should report to your doctor or health care professional as soon as possible:  allergic reactions like skin rash, itching or hives, swelling of the face, lips, or tongue  breathing problems  changes in blood pressure  feeling faint or lightheaded, falls  fever or chills  flushing, sweating, or hot feelings  swelling of the ankles or feet Side effects that usually do not require medical attention (report to your doctor or health care professional if they continue or are bothersome):  diarrhea  headache  nausea, vomiting  stomach pain This list may not describe all possible side effects. Call your doctor for medical advice about side effects. You may report side effects to FDA at 1-800-FDA-1088. Where should I keep my medicine? This drug is given in a hospital or clinic and will not be stored at home. NOTE: This sheet is a summary. It may not cover all possible information. If you have questions about this medicine, talk to your doctor, pharmacist, or health care provider.  2020 Elsevier/Gold Standard (2016-04-12 20:21:10)   Blood Transfusion, Adult, Care After This sheet gives you information about how to care for yourself after your procedure. Your doctor may   also give you more specific instructions. If you have problems or questions, contact your doctor. What can I expect after the procedure? After the procedure, it is common to have:  Bruising and soreness at the IV site.  A fever or chills on the day of the procedure. This may be your body's response  to the new blood cells received.  A headache. Follow these instructions at home: Insertion site care      Follow instructions from your doctor about how to take care of your insertion site. This is where an IV tube was put into your vein. Make sure you: ? Wash your hands with soap and water before and after you change your bandage (dressing). If you cannot use soap and water, use hand sanitizer. ? Change your bandage as told by your doctor.  Check your insertion site every day for signs of infection. Check for: ? Redness, swelling, or pain. ? Bleeding from the site. ? Warmth. ? Pus or a bad smell. General instructions  Take over-the-counter and prescription medicines only as told by your doctor.  Rest as told by your doctor.  Go back to your normal activities as told by your doctor.  Keep all follow-up visits as told by your doctor. This is important. Contact a doctor if:  You have itching or red, swollen areas of skin (hives).  You feel worried or nervous (anxious).  You feel weak after doing your normal activities.  You have redness, swelling, warmth, or pain around the insertion site.  You have blood coming from the insertion site, and the blood does not stop with pressure.  You have pus or a bad smell coming from the insertion site. Get help right away if:  You have signs of a serious reaction. This may be coming from an allergy or the body's defense system (immune system). Signs include: ? Trouble breathing or shortness of breath. ? Swelling of the face or feeling warm (flushed). ? Fever or chills. ? Head, chest, or back pain. ? Dark pee (urine) or blood in the pee. ? Widespread rash. ? Fast heartbeat. ? Feeling dizzy or light-headed. You may receive your blood transfusion in an outpatient setting. If so, you will be told whom to contact to report any reactions. These symptoms may be an emergency. Do not wait to see if the symptoms will go away. Get medical  help right away. Call your local emergency services (911 in the [place] Do not drive yourself to the hospital. Summary  Bruising and soreness at the IV site are common.  Check your insertion site every day for signs of infection.  Rest as told by your doctor. Go back to your normal activities as told by your doctor.  Get help right away if you have signs of a serious reaction. This information is not intended to replace advice given to you by your health care provider. Make sure you discuss any questions you have with your health care provider. Document Revised: 08/17/2018 Document Reviewed: 08/17/2018 Elsevier Patient Education  2020 Elsevier Inc.  

## 2019-12-07 ENCOUNTER — Other Ambulatory Visit: Payer: Self-pay

## 2019-12-07 LAB — BPAM RBC
Blood Product Expiration Date: 202110162359
ISSUE DATE / TIME: 202109301324
Unit Type and Rh: 6200

## 2019-12-07 LAB — TYPE AND SCREEN
ABO/RH(D): A POS
Antibody Screen: NEGATIVE
Unit division: 0

## 2019-12-13 DIAGNOSIS — H401131 Primary open-angle glaucoma, bilateral, mild stage: Secondary | ICD-10-CM | POA: Diagnosis not present

## 2019-12-13 DIAGNOSIS — Z961 Presence of intraocular lens: Secondary | ICD-10-CM | POA: Diagnosis not present

## 2019-12-14 ENCOUNTER — Other Ambulatory Visit: Payer: Self-pay

## 2019-12-14 ENCOUNTER — Inpatient Hospital Stay: Payer: Medicare Other | Attending: Oncology

## 2019-12-14 VITALS — BP 149/70 | HR 102 | Temp 98.5°F | Resp 20

## 2019-12-14 DIAGNOSIS — D631 Anemia in chronic kidney disease: Secondary | ICD-10-CM | POA: Diagnosis not present

## 2019-12-14 DIAGNOSIS — D509 Iron deficiency anemia, unspecified: Secondary | ICD-10-CM

## 2019-12-14 DIAGNOSIS — I129 Hypertensive chronic kidney disease with stage 1 through stage 4 chronic kidney disease, or unspecified chronic kidney disease: Secondary | ICD-10-CM | POA: Diagnosis not present

## 2019-12-14 DIAGNOSIS — N189 Chronic kidney disease, unspecified: Secondary | ICD-10-CM | POA: Insufficient documentation

## 2019-12-14 DIAGNOSIS — Z79899 Other long term (current) drug therapy: Secondary | ICD-10-CM | POA: Diagnosis not present

## 2019-12-14 DIAGNOSIS — D649 Anemia, unspecified: Secondary | ICD-10-CM

## 2019-12-14 MED ORDER — SODIUM CHLORIDE 0.9 % IV SOLN
Freq: Once | INTRAVENOUS | Status: AC
  Filled 2019-12-14: qty 250

## 2019-12-14 MED ORDER — SODIUM CHLORIDE 0.9 % IV SOLN
510.0000 mg | Freq: Once | INTRAVENOUS | Status: AC
  Administered 2019-12-14: 510 mg via INTRAVENOUS
  Filled 2019-12-14: qty 510

## 2019-12-14 NOTE — Patient Instructions (Signed)

## 2019-12-18 DIAGNOSIS — M79672 Pain in left foot: Secondary | ICD-10-CM | POA: Diagnosis not present

## 2019-12-18 DIAGNOSIS — B351 Tinea unguium: Secondary | ICD-10-CM | POA: Diagnosis not present

## 2019-12-18 DIAGNOSIS — L84 Corns and callosities: Secondary | ICD-10-CM | POA: Diagnosis not present

## 2019-12-18 DIAGNOSIS — M79671 Pain in right foot: Secondary | ICD-10-CM | POA: Diagnosis not present

## 2019-12-26 ENCOUNTER — Inpatient Hospital Stay: Payer: Medicare Other

## 2019-12-26 ENCOUNTER — Other Ambulatory Visit: Payer: Self-pay

## 2019-12-26 VITALS — BP 147/77 | HR 101 | Resp 20

## 2019-12-26 DIAGNOSIS — N189 Chronic kidney disease, unspecified: Secondary | ICD-10-CM

## 2019-12-26 DIAGNOSIS — D649 Anemia, unspecified: Secondary | ICD-10-CM

## 2019-12-26 DIAGNOSIS — D631 Anemia in chronic kidney disease: Secondary | ICD-10-CM

## 2019-12-26 DIAGNOSIS — D509 Iron deficiency anemia, unspecified: Secondary | ICD-10-CM

## 2019-12-26 DIAGNOSIS — I129 Hypertensive chronic kidney disease with stage 1 through stage 4 chronic kidney disease, or unspecified chronic kidney disease: Secondary | ICD-10-CM | POA: Diagnosis not present

## 2019-12-26 DIAGNOSIS — Z79899 Other long term (current) drug therapy: Secondary | ICD-10-CM | POA: Diagnosis not present

## 2019-12-26 LAB — CBC WITH DIFFERENTIAL (CANCER CENTER ONLY)
Abs Immature Granulocytes: 0.03 10*3/uL (ref 0.00–0.07)
Basophils Absolute: 0.1 10*3/uL (ref 0.0–0.1)
Basophils Relative: 1 %
Eosinophils Absolute: 0.1 10*3/uL (ref 0.0–0.5)
Eosinophils Relative: 1 %
HCT: 31.6 % — ABNORMAL LOW (ref 36.0–46.0)
Hemoglobin: 9.9 g/dL — ABNORMAL LOW (ref 12.0–15.0)
Immature Granulocytes: 1 %
Lymphocytes Relative: 6 %
Lymphs Abs: 0.4 10*3/uL — ABNORMAL LOW (ref 0.7–4.0)
MCH: 28.8 pg (ref 26.0–34.0)
MCHC: 31.3 g/dL (ref 30.0–36.0)
MCV: 91.9 fL (ref 80.0–100.0)
Monocytes Absolute: 0.9 10*3/uL (ref 0.1–1.0)
Monocytes Relative: 15 %
Neutro Abs: 4.9 10*3/uL (ref 1.7–7.7)
Neutrophils Relative %: 76 %
Platelet Count: 260 10*3/uL (ref 150–400)
RBC: 3.44 MIL/uL — ABNORMAL LOW (ref 3.87–5.11)
RDW: 21.2 % — ABNORMAL HIGH (ref 11.5–15.5)
WBC Count: 6.4 10*3/uL (ref 4.0–10.5)
nRBC: 0 % (ref 0.0–0.2)

## 2019-12-26 MED ORDER — DARBEPOETIN ALFA 300 MCG/0.6ML IJ SOSY
300.0000 ug | PREFILLED_SYRINGE | Freq: Once | INTRAMUSCULAR | Status: AC
  Administered 2019-12-26: 300 ug via SUBCUTANEOUS

## 2019-12-26 MED ORDER — DARBEPOETIN ALFA 300 MCG/0.6ML IJ SOSY
PREFILLED_SYRINGE | INTRAMUSCULAR | Status: AC
  Filled 2019-12-26: qty 0.6

## 2019-12-27 LAB — IRON AND TIBC
Iron: 53 ug/dL (ref 41–142)
Saturation Ratios: 31 % (ref 21–57)
TIBC: 168 ug/dL — ABNORMAL LOW (ref 236–444)
UIBC: 115 ug/dL — ABNORMAL LOW (ref 120–384)

## 2019-12-27 LAB — FERRITIN: Ferritin: 455 ng/mL — ABNORMAL HIGH (ref 11–307)

## 2020-01-03 DIAGNOSIS — E559 Vitamin D deficiency, unspecified: Secondary | ICD-10-CM | POA: Diagnosis not present

## 2020-01-03 DIAGNOSIS — I10 Essential (primary) hypertension: Secondary | ICD-10-CM | POA: Diagnosis not present

## 2020-01-03 DIAGNOSIS — N189 Chronic kidney disease, unspecified: Secondary | ICD-10-CM | POA: Diagnosis not present

## 2020-01-03 DIAGNOSIS — D509 Iron deficiency anemia, unspecified: Secondary | ICD-10-CM | POA: Diagnosis not present

## 2020-01-16 ENCOUNTER — Other Ambulatory Visit: Payer: Self-pay

## 2020-01-16 ENCOUNTER — Inpatient Hospital Stay: Payer: Medicare Other | Attending: Oncology

## 2020-01-16 ENCOUNTER — Inpatient Hospital Stay: Payer: Medicare Other

## 2020-01-16 VITALS — BP 144/72 | HR 98 | Temp 98.2°F | Resp 18

## 2020-01-16 DIAGNOSIS — R197 Diarrhea, unspecified: Secondary | ICD-10-CM | POA: Diagnosis not present

## 2020-01-16 DIAGNOSIS — Z7982 Long term (current) use of aspirin: Secondary | ICD-10-CM | POA: Diagnosis not present

## 2020-01-16 DIAGNOSIS — N189 Chronic kidney disease, unspecified: Secondary | ICD-10-CM

## 2020-01-16 DIAGNOSIS — D631 Anemia in chronic kidney disease: Secondary | ICD-10-CM

## 2020-01-16 DIAGNOSIS — I129 Hypertensive chronic kidney disease with stage 1 through stage 4 chronic kidney disease, or unspecified chronic kidney disease: Secondary | ICD-10-CM | POA: Insufficient documentation

## 2020-01-16 DIAGNOSIS — D509 Iron deficiency anemia, unspecified: Secondary | ICD-10-CM | POA: Diagnosis not present

## 2020-01-16 DIAGNOSIS — D649 Anemia, unspecified: Secondary | ICD-10-CM

## 2020-01-16 DIAGNOSIS — Z79899 Other long term (current) drug therapy: Secondary | ICD-10-CM | POA: Insufficient documentation

## 2020-01-16 DIAGNOSIS — R600 Localized edema: Secondary | ICD-10-CM | POA: Diagnosis not present

## 2020-01-16 LAB — CBC WITH DIFFERENTIAL (CANCER CENTER ONLY)
Abs Immature Granulocytes: 0.01 10*3/uL (ref 0.00–0.07)
Basophils Absolute: 0.1 10*3/uL (ref 0.0–0.1)
Basophils Relative: 1 %
Eosinophils Absolute: 0.1 10*3/uL (ref 0.0–0.5)
Eosinophils Relative: 1 %
HCT: 32 % — ABNORMAL LOW (ref 36.0–46.0)
Hemoglobin: 10.3 g/dL — ABNORMAL LOW (ref 12.0–15.0)
Immature Granulocytes: 0 %
Lymphocytes Relative: 6 %
Lymphs Abs: 0.5 10*3/uL — ABNORMAL LOW (ref 0.7–4.0)
MCH: 30.7 pg (ref 26.0–34.0)
MCHC: 32.2 g/dL (ref 30.0–36.0)
MCV: 95.2 fL (ref 80.0–100.0)
Monocytes Absolute: 0.8 10*3/uL (ref 0.1–1.0)
Monocytes Relative: 10 %
Neutro Abs: 6.2 10*3/uL (ref 1.7–7.7)
Neutrophils Relative %: 82 %
Platelet Count: 302 10*3/uL (ref 150–400)
RBC: 3.36 MIL/uL — ABNORMAL LOW (ref 3.87–5.11)
RDW: 19.9 % — ABNORMAL HIGH (ref 11.5–15.5)
WBC Count: 7.5 10*3/uL (ref 4.0–10.5)
nRBC: 0 % (ref 0.0–0.2)

## 2020-01-16 LAB — SAMPLE TO BLOOD BANK

## 2020-01-16 MED ORDER — DARBEPOETIN ALFA 300 MCG/0.6ML IJ SOSY
PREFILLED_SYRINGE | INTRAMUSCULAR | Status: AC
  Filled 2020-01-16: qty 0.6

## 2020-01-16 MED ORDER — DARBEPOETIN ALFA 300 MCG/0.6ML IJ SOSY
300.0000 ug | PREFILLED_SYRINGE | Freq: Once | INTRAMUSCULAR | Status: AC
  Administered 2020-01-16: 300 ug via SUBCUTANEOUS

## 2020-01-16 NOTE — Patient Instructions (Signed)
Darbepoetin Alfa injection What is this medicine? DARBEPOETIN ALFA (dar be POE e tin AL fa) helps your body make more red blood cells. It is used to treat anemia caused by chronic kidney failure and chemotherapy. This medicine may be used for other purposes; ask your health care provider or pharmacist if you have questions. COMMON BRAND NAME(S): Aranesp What should I tell my health care provider before I take this medicine? They need to know if you have any of these conditions:  blood clotting disorders or history of blood clots  cancer patient not on chemotherapy  cystic fibrosis  heart disease, such as angina, heart failure, or a history of a heart attack  hemoglobin level of 12 g/dL or greater  high blood pressure  low levels of folate, iron, or vitamin B12  seizures  an unusual or allergic reaction to darbepoetin, erythropoietin, albumin, hamster proteins, latex, other medicines, foods, dyes, or preservatives  pregnant or trying to get pregnant  breast-feeding How should I use this medicine? This medicine is for injection into a vein or under the skin. It is usually given by a health care professional in a hospital or clinic setting. If you get this medicine at home, you will be taught how to prepare and give this medicine. Use exactly as directed. Take your medicine at regular intervals. Do not take your medicine more often than directed. It is important that you put your used needles and syringes in a special sharps container. Do not put them in a trash can. If you do not have a sharps container, call your pharmacist or healthcare provider to get one. A special MedGuide will be given to you by the pharmacist with each prescription and refill. Be sure to read this information carefully each time. Talk to your pediatrician regarding the use of this medicine in children. While this medicine may be used in children as young as 1 month of age for selected conditions, precautions do  apply. Overdosage: If you think you have taken too much of this medicine contact a poison control center or emergency room at once. NOTE: This medicine is only for you. Do not share this medicine with others. What if I miss a dose? If you miss a dose, take it as soon as you can. If it is almost time for your next dose, take only that dose. Do not take double or extra doses. What may interact with this medicine? Do not take this medicine with any of the following medications:  epoetin alfa This list may not describe all possible interactions. Give your health care provider a list of all the medicines, herbs, non-prescription drugs, or dietary supplements you use. Also tell them if you smoke, drink alcohol, or use illegal drugs. Some items may interact with your medicine. What should I watch for while using this medicine? Your condition will be monitored carefully while you are receiving this medicine. You may need blood work done while you are taking this medicine. This medicine may cause a decrease in vitamin B6. You should make sure that you get enough vitamin B6 while you are taking this medicine. Discuss the foods you eat and the vitamins you take with your health care professional. What side effects may I notice from receiving this medicine? Side effects that you should report to your doctor or health care professional as soon as possible:  allergic reactions like skin rash, itching or hives, swelling of the face, lips, or tongue  breathing problems  changes in   vision  chest pain  confusion, trouble speaking or understanding  feeling faint or lightheaded, falls  high blood pressure  muscle aches or pains  pain, swelling, warmth in the leg  rapid weight gain  severe headaches  sudden numbness or weakness of the face, arm or leg  trouble walking, dizziness, loss of balance or coordination  seizures (convulsions)  swelling of the ankles, feet, hands  unusually weak or  tired Side effects that usually do not require medical attention (report to your doctor or health care professional if they continue or are bothersome):  diarrhea  fever, chills (flu-like symptoms)  headaches  nausea, vomiting  redness, stinging, or swelling at site where injected This list may not describe all possible side effects. Call your doctor for medical advice about side effects. You may report side effects to FDA at 1-800-FDA-1088. Where should I keep my medicine? Keep out of the reach of children. Store in a refrigerator between 2 and 8 degrees C (36 and 46 degrees F). Do not freeze. Do not shake. Throw away any unused portion if using a single-dose vial. Throw away any unused medicine after the expiration date. NOTE: This sheet is a summary. It may not cover all possible information. If you have questions about this medicine, talk to your doctor, pharmacist, or health care provider.  2020 Elsevier/Gold Standard (2017-03-09 16:44:20)  

## 2020-01-16 NOTE — Progress Notes (Signed)
Patient here for Aranesp 300 injection. Hgb is 10.3. Proceed with injection today per Dr Clelia Croft

## 2020-01-17 LAB — IRON AND TIBC
Iron: 43 ug/dL (ref 41–142)
Saturation Ratios: 23 % (ref 21–57)
TIBC: 186 ug/dL — ABNORMAL LOW (ref 236–444)
UIBC: 144 ug/dL (ref 120–384)

## 2020-01-17 LAB — FERRITIN: Ferritin: 122 ng/mL (ref 11–307)

## 2020-02-05 ENCOUNTER — Inpatient Hospital Stay: Payer: Medicare Other

## 2020-02-05 ENCOUNTER — Other Ambulatory Visit: Payer: Self-pay

## 2020-02-05 ENCOUNTER — Inpatient Hospital Stay (HOSPITAL_BASED_OUTPATIENT_CLINIC_OR_DEPARTMENT_OTHER): Payer: Medicare Other | Admitting: Oncology

## 2020-02-05 ENCOUNTER — Telehealth: Payer: Self-pay | Admitting: Oncology

## 2020-02-05 VITALS — BP 152/80 | HR 103 | Temp 98.7°F | Resp 20 | Wt 131.3 lb

## 2020-02-05 DIAGNOSIS — I129 Hypertensive chronic kidney disease with stage 1 through stage 4 chronic kidney disease, or unspecified chronic kidney disease: Secondary | ICD-10-CM | POA: Diagnosis not present

## 2020-02-05 DIAGNOSIS — D631 Anemia in chronic kidney disease: Secondary | ICD-10-CM | POA: Diagnosis not present

## 2020-02-05 DIAGNOSIS — D509 Iron deficiency anemia, unspecified: Secondary | ICD-10-CM

## 2020-02-05 DIAGNOSIS — D649 Anemia, unspecified: Secondary | ICD-10-CM

## 2020-02-05 DIAGNOSIS — Z79899 Other long term (current) drug therapy: Secondary | ICD-10-CM | POA: Diagnosis not present

## 2020-02-05 DIAGNOSIS — R600 Localized edema: Secondary | ICD-10-CM | POA: Diagnosis not present

## 2020-02-05 DIAGNOSIS — R197 Diarrhea, unspecified: Secondary | ICD-10-CM | POA: Diagnosis not present

## 2020-02-05 DIAGNOSIS — Z7982 Long term (current) use of aspirin: Secondary | ICD-10-CM | POA: Diagnosis not present

## 2020-02-05 DIAGNOSIS — N189 Chronic kidney disease, unspecified: Secondary | ICD-10-CM

## 2020-02-05 LAB — CBC WITH DIFFERENTIAL (CANCER CENTER ONLY)
Abs Immature Granulocytes: 0.03 10*3/uL (ref 0.00–0.07)
Basophils Absolute: 0.1 10*3/uL (ref 0.0–0.1)
Basophils Relative: 1 %
Eosinophils Absolute: 0.1 10*3/uL (ref 0.0–0.5)
Eosinophils Relative: 1 %
HCT: 28.5 % — ABNORMAL LOW (ref 36.0–46.0)
Hemoglobin: 8.9 g/dL — ABNORMAL LOW (ref 12.0–15.0)
Immature Granulocytes: 0 %
Lymphocytes Relative: 5 %
Lymphs Abs: 0.4 10*3/uL — ABNORMAL LOW (ref 0.7–4.0)
MCH: 29.6 pg (ref 26.0–34.0)
MCHC: 31.2 g/dL (ref 30.0–36.0)
MCV: 94.7 fL (ref 80.0–100.0)
Monocytes Absolute: 0.9 10*3/uL (ref 0.1–1.0)
Monocytes Relative: 10 %
Neutro Abs: 7.3 10*3/uL (ref 1.7–7.7)
Neutrophils Relative %: 83 %
Platelet Count: 300 10*3/uL (ref 150–400)
RBC: 3.01 MIL/uL — ABNORMAL LOW (ref 3.87–5.11)
RDW: 18.6 % — ABNORMAL HIGH (ref 11.5–15.5)
WBC Count: 8.9 10*3/uL (ref 4.0–10.5)
nRBC: 0 % (ref 0.0–0.2)

## 2020-02-05 LAB — IRON AND TIBC
Iron: 35 ug/dL — ABNORMAL LOW (ref 41–142)
Saturation Ratios: 15 % — ABNORMAL LOW (ref 21–57)
TIBC: 226 ug/dL — ABNORMAL LOW (ref 236–444)
UIBC: 191 ug/dL (ref 120–384)

## 2020-02-05 LAB — FERRITIN: Ferritin: 34 ng/mL (ref 11–307)

## 2020-02-05 MED ORDER — DARBEPOETIN ALFA 300 MCG/0.6ML IJ SOSY
PREFILLED_SYRINGE | INTRAMUSCULAR | Status: AC
  Filled 2020-02-05: qty 0.6

## 2020-02-05 MED ORDER — DARBEPOETIN ALFA 300 MCG/0.6ML IJ SOSY
300.0000 ug | PREFILLED_SYRINGE | Freq: Once | INTRAMUSCULAR | Status: AC
  Administered 2020-02-05: 300 ug via SUBCUTANEOUS

## 2020-02-05 NOTE — Patient Instructions (Signed)
Darbepoetin Alfa injection What is this medicine? DARBEPOETIN ALFA (dar be POE e tin AL fa) helps your body make more red blood cells. It is used to treat anemia caused by chronic kidney failure and chemotherapy. This medicine may be used for other purposes; ask your health care provider or pharmacist if you have questions. COMMON BRAND NAME(S): Aranesp What should I tell my health care provider before I take this medicine? They need to know if you have any of these conditions:  blood clotting disorders or history of blood clots  cancer patient not on chemotherapy  cystic fibrosis  heart disease, such as angina, heart failure, or a history of a heart attack  hemoglobin level of 12 g/dL or greater  high blood pressure  low levels of folate, iron, or vitamin B12  seizures  an unusual or allergic reaction to darbepoetin, erythropoietin, albumin, hamster proteins, latex, other medicines, foods, dyes, or preservatives  pregnant or trying to get pregnant  breast-feeding How should I use this medicine? This medicine is for injection into a vein or under the skin. It is usually given by a health care professional in a hospital or clinic setting. If you get this medicine at home, you will be taught how to prepare and give this medicine. Use exactly as directed. Take your medicine at regular intervals. Do not take your medicine more often than directed. It is important that you put your used needles and syringes in a special sharps container. Do not put them in a trash can. If you do not have a sharps container, call your pharmacist or healthcare provider to get one. A special MedGuide will be given to you by the pharmacist with each prescription and refill. Be sure to read this information carefully each time. Talk to your pediatrician regarding the use of this medicine in children. While this medicine may be used in children as young as 1 month of age for selected conditions, precautions do  apply. Overdosage: If you think you have taken too much of this medicine contact a poison control center or emergency room at once. NOTE: This medicine is only for you. Do not share this medicine with others. What if I miss a dose? If you miss a dose, take it as soon as you can. If it is almost time for your next dose, take only that dose. Do not take double or extra doses. What may interact with this medicine? Do not take this medicine with any of the following medications:  epoetin alfa This list may not describe all possible interactions. Give your health care provider a list of all the medicines, herbs, non-prescription drugs, or dietary supplements you use. Also tell them if you smoke, drink alcohol, or use illegal drugs. Some items may interact with your medicine. What should I watch for while using this medicine? Your condition will be monitored carefully while you are receiving this medicine. You may need blood work done while you are taking this medicine. This medicine may cause a decrease in vitamin B6. You should make sure that you get enough vitamin B6 while you are taking this medicine. Discuss the foods you eat and the vitamins you take with your health care professional. What side effects may I notice from receiving this medicine? Side effects that you should report to your doctor or health care professional as soon as possible:  allergic reactions like skin rash, itching or hives, swelling of the face, lips, or tongue  breathing problems  changes in   vision  chest pain  confusion, trouble speaking or understanding  feeling faint or lightheaded, falls  high blood pressure  muscle aches or pains  pain, swelling, warmth in the leg  rapid weight gain  severe headaches  sudden numbness or weakness of the face, arm or leg  trouble walking, dizziness, loss of balance or coordination  seizures (convulsions)  swelling of the ankles, feet, hands  unusually weak or  tired Side effects that usually do not require medical attention (report to your doctor or health care professional if they continue or are bothersome):  diarrhea  fever, chills (flu-like symptoms)  headaches  nausea, vomiting  redness, stinging, or swelling at site where injected This list may not describe all possible side effects. Call your doctor for medical advice about side effects. You may report side effects to FDA at 1-800-FDA-1088. Where should I keep my medicine? Keep out of the reach of children. Store in a refrigerator between 2 and 8 degrees C (36 and 46 degrees F). Do not freeze. Do not shake. Throw away any unused portion if using a single-dose vial. Throw away any unused medicine after the expiration date. NOTE: This sheet is a summary. It may not cover all possible information. If you have questions about this medicine, talk to your doctor, pharmacist, or health care provider.  2020 Elsevier/Gold Standard (2017-03-09 16:44:20)  

## 2020-02-05 NOTE — Progress Notes (Signed)
Hematology and Oncology Follow Up Visit  Lisa [name] [id number] [birth year] 86 y.o. 02/05/2020 2:27 PM Lisa [name], MDBarnes, [name], MD   Principle Diagnosis: 86 year old woman with iron deficiency anemia with element of chronic renal insufficiency diagnosed in 2018.     Prior Therapy: IV iron infusion infusion as well as packed red cell transfusion.  Current therapy:   Aranesp 300 mcg every 3 weeks to keep her hemoglobin above 10.   Feraheme at 1000 mg every 6 weeks.  Last infusion completed in October 2021.  Packed red cell transfusion for hemoglobin below 8.  Interim History: [name] presents today for repeat evaluation.  Since her last visit, she reports no major changes in her health.  She has reported increased lower extremity edema and overall poor nutrition.  She is eating less and has lost more weight.  She denies any abdominal pain or discomfort.  She has reported occasional diarrhea.  He denies any recent hospitalization or illnesses.               Medications: Updated on review. Current Outpatient Medications  Medication Sig Dispense Refill  . acetaminophen (TYLENOL) 500 MG tablet Take 500-1,000 mg by mouth at bedtime.    Marland Kitchen aspirin EC 81 MG tablet Take 81 mg by mouth daily.    . calcium carbonate (OS-CAL) 600 MG TABS Take 600 mg by mouth daily.     . hydrocortisone 2.5 % ointment     . Multiple Vitamins-Minerals (MULTIVITAMIN WITH MINERALS) tablet Take 1 tablet by mouth daily.    . mupirocin ointment (BACTROBAN) 2 % APPLY TO AFFECTED AREA EVERY DAY    . OVER THE COUNTER MEDICATION Apply topically as needed. Cream for arthritic pain. Patient unable to recall the name at this time.    . pramipexole (MIRAPEX) 0.25 MG tablet Take 1 tablet (0.25 mg total) by mouth in the morning and at bedtime. 180 tablet 3  . primidone (MYSOLINE) 50 MG tablet Take 3 tablets (150 mg total) by mouth at bedtime. 270 tablet 3  . raloxifene (EVISTA) 60 MG tablet Take  60 mg by mouth at bedtime.     . timolol (TIMOPTIC) 0.5 % ophthalmic solution Place 1 drop into both eyes daily.   1  . UNABLE TO FIND Med Name: Aranesp infusions every 3 weeks administered by [facility]    . VYTORIN 10-20 MG tablet Take 1 tablet by mouth daily.  0   No current facility-administered medications for this visit.     Allergies:  Allergies  Allergen Reactions  . Mycostatin [Nystatin] Swelling and Rash      Physical Exam:     Blood pressure (!) 152/80, pulse (!) 103, temperature 98.7 F (37.1 C), temperature source Oral, resp. rate 20, weight 131 lb 4.8 oz (59.6 kg), SpO2 98 %.         ECOG: 1    General appearance: Comfortable appearing without any discomfort Head: Normocephalic without any trauma Oropharynx: Mucous membranes are moist and pink without any thrush or ulcers. Eyes: Pupils are equal and round reactive to light. Lymph nodes: No cervical, supraclavicular, inguinal or axillary lymphadenopathy.   Heart:regular rate and rhythm.  S1 and S2.  Bilateral lower extremity edema noted. Lung: Clear without any rhonchi or wheezes.  No dullness to percussion. Abdomin: Soft, nontender, nondistended with good bowel sounds.  No hepatosplenomegaly. Musculoskeletal: No joint deformity or effusion.  Full range of motion noted. Neurological: No deficits noted on motor, sensory and deep  tendon reflex exam. Skin: No petechial rash or dryness.  Appeared moist.             Lab Results: Lab Results  Component Value Date   WBC 7.5 01/16/2020   HGB 10.3 (L) 01/16/2020   HCT 32.0 (L) 01/16/2020   MCV 95.2 01/16/2020   PLT 302 01/16/2020     Chemistry      Component Value Date/Time   NA 140 05/10/2019 1437   NA 135 (A) 04/01/2016 0000   K 4.7 05/10/2019 1437   CL 104 05/10/2019 1437   CO2 29 05/10/2019 1437   BUN 21 05/10/2019 1437   BUN 19 04/01/2016 0000   CREATININE 0.84 05/10/2019 1437   GLU 102 04/01/2016 0000       Component Value Date/Time   CALCIUM 8.6 (L) 05/10/2019 1437   ALKPHOS 75 05/10/2019 1437   AST 18 05/10/2019 1437   ALT 16 05/10/2019 1437   BILITOT <0.2 (L) 05/10/2019 1437       Impression and Plan:  86 year old woman with:   1.  Multifactorial anemia with chronic renal insufficiency diagnosed in 2018.     She is currently on Aranesp to keep hemoglobin above 10 without any major complications.  Risks and benefits of continuing this treatment long-term were reviewed.  Potential complication occluding hypertension and thromboembolism were discussed.  He is agreeable to proceed.  Her hemoglobin is currently at 8.9 and will receive Aranesp injection but no need for transfusion at this time.   2.  Iron deficiency: She has tolerated iron infusions without any major complications.  Iron studies obtained on January 16, 2020 showed iron level of 43 with saturation of 23% and ferritin of 140.   Risks and benefits of repeat intravenous iron were discussed.  Complications include arthralgias, myalgias and infusion related complications were reiterated.  Iron studies are currently pending from today.  She is agreeable to proceed with intravenous iron infusion at this time.  Iron studies from today indicate lower iron and saturation percentage.  She would certainly benefit from intravenous iron infusion.  3.  Lower extremity edema weight loss: Appears to be related to poor nutritional intake and decreased protein.  We have discussed the strategies to boost her nutritional intake and the length dietary consultation for her.  3.  Follow-up: She will continue to return every 3 weeks for Aranesp injection and MD follow-up in 9 weeks.   30 minutes were dedicated to this encounter.  The time was spent on reviewing disease status, reviewing laboratory data and addressing complication related to her treatment.   Eli Hose, MD 11/30/20212:27 PM

## 2020-02-05 NOTE — Telephone Encounter (Signed)
Scheduled appointments per 11/30 los. Spoke to patient's daughter who is aware of appointments dates and times.

## 2020-02-06 ENCOUNTER — Inpatient Hospital Stay: Payer: Medicare Other

## 2020-02-06 ENCOUNTER — Inpatient Hospital Stay: Payer: Medicare Other | Attending: Oncology

## 2020-02-06 ENCOUNTER — Ambulatory Visit: Payer: Medicare Other

## 2020-02-06 ENCOUNTER — Other Ambulatory Visit: Payer: Self-pay

## 2020-02-06 VITALS — BP 135/55 | HR 96 | Temp 98.7°F | Resp 18

## 2020-02-06 DIAGNOSIS — Z79899 Other long term (current) drug therapy: Secondary | ICD-10-CM | POA: Diagnosis not present

## 2020-02-06 DIAGNOSIS — I129 Hypertensive chronic kidney disease with stage 1 through stage 4 chronic kidney disease, or unspecified chronic kidney disease: Secondary | ICD-10-CM | POA: Diagnosis not present

## 2020-02-06 DIAGNOSIS — N189 Chronic kidney disease, unspecified: Secondary | ICD-10-CM

## 2020-02-06 DIAGNOSIS — D631 Anemia in chronic kidney disease: Secondary | ICD-10-CM

## 2020-02-06 DIAGNOSIS — D649 Anemia, unspecified: Secondary | ICD-10-CM

## 2020-02-06 DIAGNOSIS — D509 Iron deficiency anemia, unspecified: Secondary | ICD-10-CM

## 2020-02-06 MED ORDER — SODIUM CHLORIDE 0.9 % IV SOLN
510.0000 mg | Freq: Once | INTRAVENOUS | Status: AC
  Administered 2020-02-06: 510 mg via INTRAVENOUS
  Filled 2020-02-06: qty 510

## 2020-02-06 MED ORDER — SODIUM CHLORIDE 0.9 % IV SOLN
Freq: Once | INTRAVENOUS | Status: AC
  Filled 2020-02-06: qty 250

## 2020-02-06 NOTE — Patient Instructions (Signed)

## 2020-02-06 NOTE — Progress Notes (Signed)
Nutrition Assessment:  Referral from Dr Alen Blew due to weight loss and poor appetite.  86 year old female with iron deficiency anemia.  Past medical history of chronic renal insufficiency.    Met with patient in infusion area.  Patient reports poor appetite and does not really like the food at facility.  She prepares own breakfast of cereal and coffee or waflle with butter and maple syrup.  Lunch and Dinner she goes to dining room.  Reports that she drinks 1/2 of ensure (thinks original) mid morning each day.  Was able to drink all of it but no appetite.    Medications: MVI, calcium carbonate  Labs: reviewed  Anthropometrics:   Height: 61 inches Weight: 131 lb 11/30 9/14 139 lb 7/29 142 lb BMI: 25  8% weight loss in the last 4 months   NUTRITION DIAGNOSIS: Inadequate oral intake related to poor appetite as evidenced by 8% weight loss    INTERVENTION:  Encouraged patient switch to higher calorie shakes (350 calorie) for more calories and protein. Sample of ensure complete given to patient to try. Discussed ways to add calories and protein to current eating pattern.  Handout provided.  Encouraged patient to add peanut butter to waffle for more calories and and protein.  Patient is going to ask facility to prepare hard boiled eggs for her.   Contact information provided     MONITORING, EVALUATION, GOAL: weight trends, intake   NEXT VISIT: Dec 15 during infusion  Landrie Beale B. Zenia Resides, [place], [place] Registered Dietitian [phone] (mobile)

## 2020-02-19 ENCOUNTER — Telehealth: Payer: Self-pay | Admitting: Oncology

## 2020-02-19 NOTE — Telephone Encounter (Signed)
Rescheduled appts per 12/14 incoming call. Pt's daughter confirmed appt date and time.

## 2020-02-20 ENCOUNTER — Other Ambulatory Visit: Payer: Self-pay

## 2020-02-20 ENCOUNTER — Inpatient Hospital Stay: Payer: Medicare Other

## 2020-02-20 VITALS — BP 123/53 | HR 92 | Temp 98.7°F | Resp 16

## 2020-02-20 DIAGNOSIS — N189 Chronic kidney disease, unspecified: Secondary | ICD-10-CM

## 2020-02-20 DIAGNOSIS — I129 Hypertensive chronic kidney disease with stage 1 through stage 4 chronic kidney disease, or unspecified chronic kidney disease: Secondary | ICD-10-CM | POA: Diagnosis not present

## 2020-02-20 DIAGNOSIS — D631 Anemia in chronic kidney disease: Secondary | ICD-10-CM | POA: Diagnosis not present

## 2020-02-20 DIAGNOSIS — Z79899 Other long term (current) drug therapy: Secondary | ICD-10-CM | POA: Diagnosis not present

## 2020-02-20 DIAGNOSIS — D649 Anemia, unspecified: Secondary | ICD-10-CM

## 2020-02-20 DIAGNOSIS — D509 Iron deficiency anemia, unspecified: Secondary | ICD-10-CM

## 2020-02-20 MED ORDER — SODIUM CHLORIDE 0.9 % IV SOLN
Freq: Once | INTRAVENOUS | Status: AC
  Filled 2020-02-20: qty 250

## 2020-02-20 MED ORDER — SODIUM CHLORIDE 0.9 % IV SOLN
510.0000 mg | Freq: Once | INTRAVENOUS | Status: AC
  Administered 2020-02-20: 510 mg via INTRAVENOUS
  Filled 2020-02-20: qty 17

## 2020-02-20 NOTE — Patient Instructions (Signed)

## 2020-02-20 NOTE — Progress Notes (Signed)
Patient requested to stay for 15 minutes of her 30-minute observation period. Patient stable upon discharge and VSS. Patient verbalized understanding to notify the [facility] if she had any questions or concerns.

## 2020-02-25 ENCOUNTER — Inpatient Hospital Stay: Payer: Medicare Other

## 2020-02-25 ENCOUNTER — Other Ambulatory Visit: Payer: Self-pay

## 2020-02-25 VITALS — BP 127/61 | Temp 98.4°F | Resp 18

## 2020-02-25 DIAGNOSIS — D631 Anemia in chronic kidney disease: Secondary | ICD-10-CM

## 2020-02-25 DIAGNOSIS — D509 Iron deficiency anemia, unspecified: Secondary | ICD-10-CM

## 2020-02-25 DIAGNOSIS — D649 Anemia, unspecified: Secondary | ICD-10-CM

## 2020-02-25 DIAGNOSIS — I129 Hypertensive chronic kidney disease with stage 1 through stage 4 chronic kidney disease, or unspecified chronic kidney disease: Secondary | ICD-10-CM | POA: Diagnosis not present

## 2020-02-25 DIAGNOSIS — Z79899 Other long term (current) drug therapy: Secondary | ICD-10-CM | POA: Diagnosis not present

## 2020-02-25 DIAGNOSIS — N189 Chronic kidney disease, unspecified: Secondary | ICD-10-CM | POA: Diagnosis not present

## 2020-02-25 LAB — IRON AND TIBC
Iron: 66 ug/dL (ref 41–142)
Saturation Ratios: 35 % (ref 21–57)
TIBC: 185 ug/dL — ABNORMAL LOW (ref 236–444)
UIBC: 120 ug/dL (ref 120–384)

## 2020-02-25 LAB — CBC WITH DIFFERENTIAL (CANCER CENTER ONLY)
Abs Immature Granulocytes: 0.03 10*3/uL (ref 0.00–0.07)
Basophils Absolute: 0.1 10*3/uL (ref 0.0–0.1)
Basophils Relative: 1 %
Eosinophils Absolute: 0.1 10*3/uL (ref 0.0–0.5)
Eosinophils Relative: 1 %
HCT: 27.7 % — ABNORMAL LOW (ref 36.0–46.0)
Hemoglobin: 8.6 g/dL — ABNORMAL LOW (ref 12.0–15.0)
Immature Granulocytes: 0 %
Lymphocytes Relative: 5 %
Lymphs Abs: 0.4 10*3/uL — ABNORMAL LOW (ref 0.7–4.0)
MCH: 30.7 pg (ref 26.0–34.0)
MCHC: 31 g/dL (ref 30.0–36.0)
MCV: 98.9 fL (ref 80.0–100.0)
Monocytes Absolute: 0.8 10*3/uL (ref 0.1–1.0)
Monocytes Relative: 9 %
Neutro Abs: 7.5 10*3/uL (ref 1.7–7.7)
Neutrophils Relative %: 84 %
Platelet Count: 305 10*3/uL (ref 150–400)
RBC: 2.8 MIL/uL — ABNORMAL LOW (ref 3.87–5.11)
RDW: 19.1 % — ABNORMAL HIGH (ref 11.5–15.5)
WBC Count: 8.9 10*3/uL (ref 4.0–10.5)
nRBC: 0 % (ref 0.0–0.2)

## 2020-02-25 LAB — SAMPLE TO BLOOD BANK

## 2020-02-25 LAB — FERRITIN: Ferritin: 584 ng/mL — ABNORMAL HIGH (ref 11–307)

## 2020-02-25 MED ORDER — DARBEPOETIN ALFA 300 MCG/0.6ML IJ SOSY
PREFILLED_SYRINGE | INTRAMUSCULAR | Status: AC
  Filled 2020-02-25: qty 0.6

## 2020-02-25 MED ORDER — DARBEPOETIN ALFA 300 MCG/0.6ML IJ SOSY
300.0000 ug | PREFILLED_SYRINGE | Freq: Once | INTRAMUSCULAR | Status: AC
  Administered 2020-02-25: 300 ug via SUBCUTANEOUS

## 2020-02-25 NOTE — Patient Instructions (Signed)
Darbepoetin Alfa injection What is this medicine? DARBEPOETIN ALFA (dar be POE e tin AL fa) helps your body make more red blood cells. It is used to treat anemia caused by chronic kidney failure and chemotherapy. This medicine may be used for other purposes; ask your health care provider or pharmacist if you have questions. COMMON BRAND NAME(S): Aranesp What should I tell my health care provider before I take this medicine? They need to know if you have any of these conditions:  blood clotting disorders or history of blood clots  cancer patient not on chemotherapy  cystic fibrosis  heart disease, such as angina, heart failure, or a history of a heart attack  hemoglobin level of 12 g/dL or greater  high blood pressure  low levels of folate, iron, or vitamin B12  seizures  an unusual or allergic reaction to darbepoetin, erythropoietin, albumin, hamster proteins, latex, other medicines, foods, dyes, or preservatives  pregnant or trying to get pregnant  breast-feeding How should I use this medicine? This medicine is for injection into a vein or under the skin. It is usually given by a health care professional in a hospital or clinic setting. If you get this medicine at home, you will be taught how to prepare and give this medicine. Use exactly as directed. Take your medicine at regular intervals. Do not take your medicine more often than directed. It is important that you put your used needles and syringes in a special sharps container. Do not put them in a trash can. If you do not have a sharps container, call your pharmacist or healthcare provider to get one. A special MedGuide will be given to you by the pharmacist with each prescription and refill. Be sure to read this information carefully each time. Talk to your pediatrician regarding the use of this medicine in children. While this medicine may be used in children as young as 1 month of age for selected conditions, precautions do  apply. Overdosage: If you think you have taken too much of this medicine contact a poison control center or emergency room at once. NOTE: This medicine is only for you. Do not share this medicine with others. What if I miss a dose? If you miss a dose, take it as soon as you can. If it is almost time for your next dose, take only that dose. Do not take double or extra doses. What may interact with this medicine? Do not take this medicine with any of the following medications:  epoetin alfa This list may not describe all possible interactions. Give your health care provider a list of all the medicines, herbs, non-prescription drugs, or dietary supplements you use. Also tell them if you smoke, drink alcohol, or use illegal drugs. Some items may interact with your medicine. What should I watch for while using this medicine? Your condition will be monitored carefully while you are receiving this medicine. You may need blood work done while you are taking this medicine. This medicine may cause a decrease in vitamin B6. You should make sure that you get enough vitamin B6 while you are taking this medicine. Discuss the foods you eat and the vitamins you take with your health care professional. What side effects may I notice from receiving this medicine? Side effects that you should report to your doctor or health care professional as soon as possible:  allergic reactions like skin rash, itching or hives, swelling of the face, lips, or tongue  breathing problems  changes in   vision  chest pain  confusion, trouble speaking or understanding  feeling faint or lightheaded, falls  high blood pressure  muscle aches or pains  pain, swelling, warmth in the leg  rapid weight gain  severe headaches  sudden numbness or weakness of the face, arm or leg  trouble walking, dizziness, loss of balance or coordination  seizures (convulsions)  swelling of the ankles, feet, hands  unusually weak or  tired Side effects that usually do not require medical attention (report to your doctor or health care professional if they continue or are bothersome):  diarrhea  fever, chills (flu-like symptoms)  headaches  nausea, vomiting  redness, stinging, or swelling at site where injected This list may not describe all possible side effects. Call your doctor for medical advice about side effects. You may report side effects to FDA at 1-800-FDA-1088. Where should I keep my medicine? Keep out of the reach of children. Store in a refrigerator between 2 and 8 degrees C (36 and 46 degrees F). Do not freeze. Do not shake. Throw away any unused portion if using a single-dose vial. Throw away any unused medicine after the expiration date. NOTE: This sheet is a summary. It may not cover all possible information. If you have questions about this medicine, talk to your doctor, pharmacist, or health care provider.  2020 Elsevier/Gold Standard (2017-03-09 16:44:20)  

## 2020-02-26 ENCOUNTER — Ambulatory Visit: Payer: Medicare Other

## 2020-02-26 ENCOUNTER — Other Ambulatory Visit: Payer: Medicare Other

## 2020-02-26 DIAGNOSIS — M79672 Pain in left foot: Secondary | ICD-10-CM | POA: Diagnosis not present

## 2020-02-26 DIAGNOSIS — L84 Corns and callosities: Secondary | ICD-10-CM | POA: Diagnosis not present

## 2020-02-26 DIAGNOSIS — B351 Tinea unguium: Secondary | ICD-10-CM | POA: Diagnosis not present

## 2020-02-26 DIAGNOSIS — M79671 Pain in right foot: Secondary | ICD-10-CM | POA: Diagnosis not present

## 2020-02-27 ENCOUNTER — Telehealth: Payer: Self-pay | Admitting: Dietician

## 2020-02-27 NOTE — Telephone Encounter (Signed)
Nutrition Follow-up completed via phone. Spoke with patients daughter Thayer [place]).  Daughter reports ongoing decreased appetite and new lower extremity swelling, seeing PCP tomorrow. Reports pt had half of a bacon, egg, and cheese biscuit for breakfast, the rest for lunch. Daughter reports pt liked Ensure Plus, drinks one on most days. She likes to snack, daughter recalls cheese and crackers, yogurt, bananas, cottage cheese, and peanut butter crackers. Pt lives independently at [facility], on a meal plan and receives 2 meals/day. Patient has not been going to dining hall as much, usually will go once and bring back food to eat later. Daughter has arranged for meals to be delivered to her apartment.   Medications: Os-cal, MVI, Mirapex, Mysoline  Labs: Ferratin 584 (H), RBC 2.8 (L), Hgb 8.6 (L), HCT 27.7 (L) on 12/20  Anthropometrics:   11/30 - 131 lbs 4.8 oz   NUTRITION DIAGNOSIS: Inadequate oral intake related to poor appetite continues   INTERVENTION:  Encouraged daily Ensure Plus supplement  Discussed high calorie/protein snacks   Contact information provided  MONITORING, EVALUATION, GOAL: weight trends   NEXT VISIT: Follow-up via telephone in 3-4 weeks  Lars Masson, RD, LDN Clinical Nutrition After Hours/Weekend Pager # in Amion

## 2020-02-28 DIAGNOSIS — R197 Diarrhea, unspecified: Secondary | ICD-10-CM | POA: Diagnosis not present

## 2020-02-28 DIAGNOSIS — R634 Abnormal weight loss: Secondary | ICD-10-CM | POA: Diagnosis not present

## 2020-02-28 DIAGNOSIS — R600 Localized edema: Secondary | ICD-10-CM | POA: Diagnosis not present

## 2020-02-28 DIAGNOSIS — R000 Tachycardia, unspecified: Secondary | ICD-10-CM | POA: Diagnosis not present

## 2020-03-05 DIAGNOSIS — L821 Other seborrheic keratosis: Secondary | ICD-10-CM | POA: Diagnosis not present

## 2020-03-05 DIAGNOSIS — L820 Inflamed seborrheic keratosis: Secondary | ICD-10-CM | POA: Diagnosis not present

## 2020-03-05 DIAGNOSIS — Z85828 Personal history of other malignant neoplasm of skin: Secondary | ICD-10-CM | POA: Diagnosis not present

## 2020-03-05 DIAGNOSIS — C44311 Basal cell carcinoma of skin of nose: Secondary | ICD-10-CM | POA: Diagnosis not present

## 2020-03-05 DIAGNOSIS — D485 Neoplasm of uncertain behavior of skin: Secondary | ICD-10-CM | POA: Diagnosis not present

## 2020-03-18 ENCOUNTER — Other Ambulatory Visit: Payer: Self-pay

## 2020-03-18 ENCOUNTER — Inpatient Hospital Stay: Payer: Medicare Other

## 2020-03-18 ENCOUNTER — Inpatient Hospital Stay: Payer: Medicare Other | Attending: Oncology

## 2020-03-18 VITALS — BP 140/63 | HR 107 | Resp 18

## 2020-03-18 DIAGNOSIS — N189 Chronic kidney disease, unspecified: Secondary | ICD-10-CM | POA: Diagnosis not present

## 2020-03-18 DIAGNOSIS — Z79899 Other long term (current) drug therapy: Secondary | ICD-10-CM | POA: Diagnosis not present

## 2020-03-18 DIAGNOSIS — D509 Iron deficiency anemia, unspecified: Secondary | ICD-10-CM

## 2020-03-18 DIAGNOSIS — D631 Anemia in chronic kidney disease: Secondary | ICD-10-CM

## 2020-03-18 DIAGNOSIS — D649 Anemia, unspecified: Secondary | ICD-10-CM

## 2020-03-18 DIAGNOSIS — I129 Hypertensive chronic kidney disease with stage 1 through stage 4 chronic kidney disease, or unspecified chronic kidney disease: Secondary | ICD-10-CM | POA: Insufficient documentation

## 2020-03-18 LAB — CBC WITH DIFFERENTIAL (CANCER CENTER ONLY)
Abs Immature Granulocytes: 0.05 10*3/uL (ref 0.00–0.07)
Basophils Absolute: 0.1 10*3/uL (ref 0.0–0.1)
Basophils Relative: 1 %
Eosinophils Absolute: 0.1 10*3/uL (ref 0.0–0.5)
Eosinophils Relative: 1 %
HCT: 27.6 % — ABNORMAL LOW (ref 36.0–46.0)
Hemoglobin: 8.6 g/dL — ABNORMAL LOW (ref 12.0–15.0)
Immature Granulocytes: 1 %
Lymphocytes Relative: 4 %
Lymphs Abs: 0.4 10*3/uL — ABNORMAL LOW (ref 0.7–4.0)
MCH: 31.4 pg (ref 26.0–34.0)
MCHC: 31.2 g/dL (ref 30.0–36.0)
MCV: 100.7 fL — ABNORMAL HIGH (ref 80.0–100.0)
Monocytes Absolute: 0.8 10*3/uL (ref 0.1–1.0)
Monocytes Relative: 8 %
Neutro Abs: 8.4 10*3/uL — ABNORMAL HIGH (ref 1.7–7.7)
Neutrophils Relative %: 85 %
Platelet Count: 270 10*3/uL (ref 150–400)
RBC: 2.74 MIL/uL — ABNORMAL LOW (ref 3.87–5.11)
RDW: 16.7 % — ABNORMAL HIGH (ref 11.5–15.5)
WBC Count: 9.8 10*3/uL (ref 4.0–10.5)
nRBC: 0 % (ref 0.0–0.2)

## 2020-03-18 LAB — IRON AND TIBC
Iron: 20 ug/dL — ABNORMAL LOW (ref 41–142)
Saturation Ratios: 10 % — ABNORMAL LOW (ref 21–57)
TIBC: 203 ug/dL — ABNORMAL LOW (ref 236–444)
UIBC: 183 ug/dL (ref 120–384)

## 2020-03-18 LAB — FERRITIN: Ferritin: 98 ng/mL (ref 11–307)

## 2020-03-18 LAB — SAMPLE TO BLOOD BANK

## 2020-03-18 MED ORDER — DARBEPOETIN ALFA 300 MCG/0.6ML IJ SOSY
300.0000 ug | PREFILLED_SYRINGE | Freq: Once | INTRAMUSCULAR | Status: AC
  Administered 2020-03-18: 300 ug via SUBCUTANEOUS

## 2020-03-18 MED ORDER — DARBEPOETIN ALFA 300 MCG/0.6ML IJ SOSY
PREFILLED_SYRINGE | INTRAMUSCULAR | Status: AC
  Filled 2020-03-18: qty 0.6

## 2020-03-18 NOTE — Patient Instructions (Signed)
Darbepoetin Alfa injection What is this medicine? DARBEPOETIN ALFA (dar be POE e tin AL fa) helps your body make more red blood cells. It is used to treat anemia caused by chronic kidney failure and chemotherapy. This medicine may be used for other purposes; ask your health care provider or pharmacist if you have questions. COMMON BRAND NAME(S): Aranesp What should I tell my health care provider before I take this medicine? They need to know if you have any of these conditions:  blood clotting disorders or history of blood clots  cancer patient not on chemotherapy  cystic fibrosis  heart disease, such as angina, heart failure, or a history of a heart attack  hemoglobin level of 12 g/dL or greater  high blood pressure  low levels of folate, iron, or vitamin B12  seizures  an unusual or allergic reaction to darbepoetin, erythropoietin, albumin, hamster proteins, latex, other medicines, foods, dyes, or preservatives  pregnant or trying to get pregnant  breast-feeding How should I use this medicine? This medicine is for injection into a vein or under the skin. It is usually given by a health care professional in a hospital or clinic setting. If you get this medicine at home, you will be taught how to prepare and give this medicine. Use exactly as directed. Take your medicine at regular intervals. Do not take your medicine more often than directed. It is important that you put your used needles and syringes in a special sharps container. Do not put them in a trash can. If you do not have a sharps container, call your pharmacist or healthcare provider to get one. A special MedGuide will be given to you by the pharmacist with each prescription and refill. Be sure to read this information carefully each time. Talk to your pediatrician regarding the use of this medicine in children. While this medicine may be used in children as young as 1 month of age for selected conditions, precautions do  apply. Overdosage: If you think you have taken too much of this medicine contact a poison control center or emergency room at once. NOTE: This medicine is only for you. Do not share this medicine with others. What if I miss a dose? If you miss a dose, take it as soon as you can. If it is almost time for your next dose, take only that dose. Do not take double or extra doses. What may interact with this medicine? Do not take this medicine with any of the following medications:  epoetin alfa This list may not describe all possible interactions. Give your health care provider a list of all the medicines, herbs, non-prescription drugs, or dietary supplements you use. Also tell them if you smoke, drink alcohol, or use illegal drugs. Some items may interact with your medicine. What should I watch for while using this medicine? Your condition will be monitored carefully while you are receiving this medicine. You may need blood work done while you are taking this medicine. This medicine may cause a decrease in vitamin B6. You should make sure that you get enough vitamin B6 while you are taking this medicine. Discuss the foods you eat and the vitamins you take with your health care professional. What side effects may I notice from receiving this medicine? Side effects that you should report to your doctor or health care professional as soon as possible:  allergic reactions like skin rash, itching or hives, swelling of the face, lips, or tongue  breathing problems  changes in   vision  chest pain  confusion, trouble speaking or understanding  feeling faint or lightheaded, falls  high blood pressure  muscle aches or pains  pain, swelling, warmth in the leg  rapid weight gain  severe headaches  sudden numbness or weakness of the face, arm or leg  trouble walking, dizziness, loss of balance or coordination  seizures (convulsions)  swelling of the ankles, feet, hands  unusually weak or  tired Side effects that usually do not require medical attention (report to your doctor or health care professional if they continue or are bothersome):  diarrhea  fever, chills (flu-like symptoms)  headaches  nausea, vomiting  redness, stinging, or swelling at site where injected This list may not describe all possible side effects. Call your doctor for medical advice about side effects. You may report side effects to FDA at 1-800-FDA-1088. Where should I keep my medicine? Keep out of the reach of children. Store in a refrigerator between 2 and 8 degrees C (36 and 46 degrees F). Do not freeze. Do not shake. Throw away any unused portion if using a single-dose vial. Throw away any unused medicine after the expiration date. NOTE: This sheet is a summary. It may not cover all possible information. If you have questions about this medicine, talk to your doctor, pharmacist, or health care provider.  2021 Elsevier/Gold Standard (2017-03-09 16:44:20)  

## 2020-04-08 ENCOUNTER — Inpatient Hospital Stay (HOSPITAL_BASED_OUTPATIENT_CLINIC_OR_DEPARTMENT_OTHER): Payer: Medicare Other | Admitting: Oncology

## 2020-04-08 ENCOUNTER — Inpatient Hospital Stay: Payer: Medicare Other | Attending: Oncology

## 2020-04-08 ENCOUNTER — Other Ambulatory Visit: Payer: Self-pay

## 2020-04-08 ENCOUNTER — Inpatient Hospital Stay: Payer: Medicare Other

## 2020-04-08 VITALS — BP 125/57 | HR 115 | Temp 98.1°F | Resp 20 | Ht 60.0 in | Wt 126.7 lb

## 2020-04-08 DIAGNOSIS — D631 Anemia in chronic kidney disease: Secondary | ICD-10-CM

## 2020-04-08 DIAGNOSIS — Z79899 Other long term (current) drug therapy: Secondary | ICD-10-CM | POA: Diagnosis not present

## 2020-04-08 DIAGNOSIS — I129 Hypertensive chronic kidney disease with stage 1 through stage 4 chronic kidney disease, or unspecified chronic kidney disease: Secondary | ICD-10-CM | POA: Diagnosis not present

## 2020-04-08 DIAGNOSIS — N189 Chronic kidney disease, unspecified: Secondary | ICD-10-CM

## 2020-04-08 DIAGNOSIS — D649 Anemia, unspecified: Secondary | ICD-10-CM

## 2020-04-08 DIAGNOSIS — D509 Iron deficiency anemia, unspecified: Secondary | ICD-10-CM | POA: Diagnosis not present

## 2020-04-08 LAB — IRON AND TIBC
Iron: 18 ug/dL — ABNORMAL LOW (ref 41–142)
Saturation Ratios: 7 % — ABNORMAL LOW (ref 21–57)
TIBC: 245 ug/dL (ref 236–444)
UIBC: 227 ug/dL (ref 120–384)

## 2020-04-08 LAB — CBC WITH DIFFERENTIAL (CANCER CENTER ONLY)
Abs Immature Granulocytes: 0.06 10*3/uL (ref 0.00–0.07)
Basophils Absolute: 0 10*3/uL (ref 0.0–0.1)
Basophils Relative: 0 %
Eosinophils Absolute: 0.1 10*3/uL (ref 0.0–0.5)
Eosinophils Relative: 1 %
HCT: 23.5 % — ABNORMAL LOW (ref 36.0–46.0)
Hemoglobin: 7.4 g/dL — ABNORMAL LOW (ref 12.0–15.0)
Immature Granulocytes: 1 %
Lymphocytes Relative: 5 %
Lymphs Abs: 0.5 10*3/uL — ABNORMAL LOW (ref 0.7–4.0)
MCH: 29.6 pg (ref 26.0–34.0)
MCHC: 31.5 g/dL (ref 30.0–36.0)
MCV: 94 fL (ref 80.0–100.0)
Monocytes Absolute: 0.9 10*3/uL (ref 0.1–1.0)
Monocytes Relative: 9 %
Neutro Abs: 8.6 10*3/uL — ABNORMAL HIGH (ref 1.7–7.7)
Neutrophils Relative %: 84 %
Platelet Count: 347 10*3/uL (ref 150–400)
RBC: 2.5 MIL/uL — ABNORMAL LOW (ref 3.87–5.11)
RDW: 16.4 % — ABNORMAL HIGH (ref 11.5–15.5)
WBC Count: 10.1 10*3/uL (ref 4.0–10.5)
nRBC: 0 % (ref 0.0–0.2)

## 2020-04-08 LAB — FERRITIN: Ferritin: 33 ng/mL (ref 11–307)

## 2020-04-08 LAB — SAMPLE TO BLOOD BANK

## 2020-04-08 MED ORDER — DARBEPOETIN ALFA 300 MCG/0.6ML IJ SOSY
PREFILLED_SYRINGE | INTRAMUSCULAR | Status: AC
  Filled 2020-04-08: qty 0.6

## 2020-04-08 MED ORDER — DARBEPOETIN ALFA 300 MCG/0.6ML IJ SOSY
300.0000 ug | PREFILLED_SYRINGE | Freq: Once | INTRAMUSCULAR | Status: AC
  Administered 2020-04-08: 300 ug via SUBCUTANEOUS

## 2020-04-08 NOTE — Progress Notes (Signed)
Hematology and Oncology Follow Up Visit  Lisa [name] [id number] April 06, 1923 87 y.o. 04/08/2020 2:39 PM Lisa [name], MDBarnes, [name], MD   Principle Diagnosis: 87 year old woman with anemia diagnosed in 2018.  She was found to have iron deficiency in addition to renal insufficiency indicating multifactorial causes.   Prior Therapy: IV iron infusion infusion as well as packed red cell transfusion.  Current therapy:   Aranesp 300 mcg every 3 weeks to keep her hemoglobin above 10.  Feraheme at 1000 mg every 6 to 9 weeks.  Packed red cell transfusion for hemoglobin above 8.  Interim History: Lisa [name] is here for a follow-up visit.  Since last visit, she reports no major changes in her health. She has reported more fatigue tiredness and decrease in her oral intake and of lost more weight. She is still ambulatory with the help of a walker but had not had any falls or syncope. She denies any complications related to Aranesp.                Medications: Updated on review. Current Outpatient Medications  Medication Sig Dispense Refill  . acetaminophen (TYLENOL) 500 MG tablet Take 500-1,000 mg by mouth at bedtime.    Marland Kitchen aspirin EC 81 MG tablet Take 81 mg by mouth daily.    . calcium carbonate (OS-CAL) 600 MG TABS Take 600 mg by mouth daily.     . hydrocortisone 2.5 % ointment     . Multiple Vitamins-Minerals (MULTIVITAMIN WITH MINERALS) tablet Take 1 tablet by mouth daily.    . mupirocin ointment (BACTROBAN) 2 % APPLY TO AFFECTED AREA EVERY DAY    . OVER THE COUNTER MEDICATION Apply topically as needed. Cream for arthritic pain. Patient unable to recall the name at this time.    . pramipexole (MIRAPEX) 0.25 MG tablet Take 1 tablet (0.25 mg total) by mouth in the morning and at bedtime. 180 tablet 3  . primidone (MYSOLINE) 50 MG tablet Take 3 tablets (150 mg total) by mouth at bedtime. 270 tablet 3  . raloxifene (EVISTA) 60 MG tablet Take 60 mg by mouth at bedtime.      . timolol (TIMOPTIC) 0.5 % ophthalmic solution Place 1 drop into both eyes daily.   1  . UNABLE TO FIND Med Name: Aranesp infusions every 3 weeks administered by [facility]    . VYTORIN 10-20 MG tablet Take 1 tablet by mouth daily.  0   No current facility-administered medications for this visit.     Allergies:  Allergies  Allergen Reactions  . Mycostatin [Nystatin] Swelling and Rash      Physical Exam:  Blood pressure (!) 125/57, pulse (!) 115, temperature 98.1 F (36.7 C), temperature source Tympanic, resp. rate 20, height 5' (1.524 m), weight 126 lb 11.2 oz (57.5 kg), SpO2 98 %.    ECOG: 1    General appearance: Comfortable appearing without any discomfort Head: Normocephalic without any trauma Oropharynx: Mucous membranes are moist and pink without any thrush or ulcers. Eyes: Pupils are equal and round reactive to light. Lymph nodes: No cervical, supraclavicular, inguinal or axillary lymphadenopathy.   Heart:regular rate and rhythm.  S1 and S2 without leg edema. Lung: Clear without any rhonchi or wheezes.  No dullness to percussion. Abdomin: Soft, nontender, nondistended with good bowel sounds.  No hepatosplenomegaly. Musculoskeletal: No joint deformity or effusion.  Full range of motion noted. Neurological: No deficits noted on motor, sensory and deep tendon reflex exam. Skin: No petechial rash or  dryness.  Appeared moist.  .           Lab Results: Lab Results  Component Value Date   WBC 9.8 03/18/2020   HGB 8.6 (L) 03/18/2020   HCT 27.6 (L) 03/18/2020   MCV 100.7 (H) 03/18/2020   PLT 270 03/18/2020     Chemistry      Component Value Date/Time   NA 140 05/10/2019 1437   NA 135 (A) 04/01/2016 0000   K 4.7 05/10/2019 1437   CL 104 05/10/2019 1437   CO2 29 05/10/2019 1437   BUN 21 05/10/2019 1437   BUN 19 04/01/2016 0000   CREATININE 0.84 05/10/2019 1437   GLU 102 04/01/2016 0000      Component Value Date/Time   CALCIUM 8.6  (L) 05/10/2019 1437   ALKPHOS 75 05/10/2019 1437   AST 18 05/10/2019 1437   ALT 16 05/10/2019 1437   BILITOT <0.2 (L) 05/10/2019 1437       Impression and Plan:  87 year old woman with:   1.  Multifactorial anemia with anemia of renal disease, iron deficiency diagnosed in 2018.     She has been receiving supportive measures including growth factor support and transfusion as needed.  Risks and benefits of continuing this approach were reviewed at this time.  Potential complication associated with Aranesp occluding hypertension and venous thromboembolism were reiterated.  Hemoglobin today is down to 7.4 and she will receive 1 unit of packed red cell transfusion. She is agreeable to proceed.   2.  Iron deficiency: Related to poor absorption and potential GI losses.  She has received intravenous iron infusion periodically.  Iron studies obtained on March 18, 2020 indicates worsening iron deficiency and that we will arrange for intravenous iron infusion in the near future.  She will receive Feraheme infusion on February 2 and repeat on February 9.  3.  Follow-up: She will continue to follow-up every 3 weeks with Aranesp and MD follow-up in 9 weeks.   30 minutes were dedicated to this encounter.  Time was spent on reviewing disease status, reviewing treatment options and future plan of care discussion.     Eli Hose, MD 2/1/20222:39 PM

## 2020-04-09 ENCOUNTER — Other Ambulatory Visit: Payer: Self-pay

## 2020-04-09 ENCOUNTER — Inpatient Hospital Stay: Payer: Medicare Other

## 2020-04-09 VITALS — BP 138/67 | HR 92 | Temp 99.3°F | Resp 18

## 2020-04-09 DIAGNOSIS — D631 Anemia in chronic kidney disease: Secondary | ICD-10-CM

## 2020-04-09 DIAGNOSIS — D509 Iron deficiency anemia, unspecified: Secondary | ICD-10-CM | POA: Diagnosis not present

## 2020-04-09 DIAGNOSIS — N189 Chronic kidney disease, unspecified: Secondary | ICD-10-CM | POA: Diagnosis not present

## 2020-04-09 DIAGNOSIS — D649 Anemia, unspecified: Secondary | ICD-10-CM

## 2020-04-09 DIAGNOSIS — Z79899 Other long term (current) drug therapy: Secondary | ICD-10-CM | POA: Diagnosis not present

## 2020-04-09 DIAGNOSIS — I129 Hypertensive chronic kidney disease with stage 1 through stage 4 chronic kidney disease, or unspecified chronic kidney disease: Secondary | ICD-10-CM | POA: Diagnosis not present

## 2020-04-09 LAB — PREPARE RBC (CROSSMATCH)

## 2020-04-09 MED ORDER — DIPHENHYDRAMINE HCL 25 MG PO CAPS
ORAL_CAPSULE | ORAL | Status: AC
  Filled 2020-04-09: qty 1

## 2020-04-09 MED ORDER — ACETAMINOPHEN 325 MG PO TABS
ORAL_TABLET | ORAL | Status: AC
  Filled 2020-04-09: qty 2

## 2020-04-09 MED ORDER — SODIUM CHLORIDE 0.9% IV SOLUTION
250.0000 mL | Freq: Once | INTRAVENOUS | Status: AC
  Administered 2020-04-09: 250 mL via INTRAVENOUS
  Filled 2020-04-09: qty 250

## 2020-04-09 MED ORDER — SODIUM CHLORIDE 0.9 % IV SOLN
Freq: Once | INTRAVENOUS | Status: AC
  Filled 2020-04-09: qty 250

## 2020-04-09 MED ORDER — DIPHENHYDRAMINE HCL 25 MG PO CAPS
25.0000 mg | ORAL_CAPSULE | Freq: Once | ORAL | Status: AC
  Administered 2020-04-09: 25 mg via ORAL

## 2020-04-09 MED ORDER — SODIUM CHLORIDE 0.9 % IV SOLN
510.0000 mg | Freq: Once | INTRAVENOUS | Status: AC
Start: 2020-04-09 — End: 2020-04-09
  Administered 2020-04-09: 510 mg via INTRAVENOUS
  Filled 2020-04-09: qty 510

## 2020-04-09 MED ORDER — ACETAMINOPHEN 325 MG PO TABS
650.0000 mg | ORAL_TABLET | Freq: Once | ORAL | Status: AC
  Administered 2020-04-09: 650 mg via ORAL

## 2020-04-09 NOTE — Progress Notes (Signed)
Per Dr. Clelia Croft patient to receive Feraheme and one unit of blood today. Orders placed for one unit of blood.

## 2020-04-09 NOTE — Patient Instructions (Signed)
Iron Sucrose injection What is this medicine? IRON SUCROSE (AHY ern SOO krohs) is an iron complex. Iron is used to make healthy red blood cells, which carry oxygen and nutrients throughout the body. This medicine is used to treat iron deficiency anemia in people with chronic kidney disease. This medicine may be used for other purposes; ask your health care provider or pharmacist if you have questions. COMMON BRAND NAME(S): Venofer What should I tell my health care provider before I take this medicine? They need to know if you have any of these conditions:  anemia not caused by low iron levels  heart disease  high levels of iron in the blood  kidney disease  liver disease  an unusual or allergic reaction to iron, other medicines, foods, dyes, or preservatives  pregnant or trying to get pregnant  breast-feeding How should I use this medicine? This medicine is for infusion into a vein. It is given by a health care professional in a hospital or clinic setting. Talk to your pediatrician regarding the use of this medicine in children. While this drug may be prescribed for children as young as 2 years for selected conditions, precautions do apply. Overdosage: If you think you have taken too much of this medicine contact a poison control center or emergency room at once. NOTE: This medicine is only for you. Do not share this medicine with others. What if I miss a dose? It is important not to miss your dose. Call your doctor or health care professional if you are unable to keep an appointment. What may interact with this medicine? Do not take this medicine with any of the following medications:  deferoxamine  dimercaprol  other iron products This medicine may also interact with the following medications:  chloramphenicol  deferasirox This list may not describe all possible interactions. Give your health care provider a list of all the medicines, herbs, non-prescription drugs, or  dietary supplements you use. Also tell them if you smoke, drink alcohol, or use illegal drugs. Some items may interact with your medicine. What should I watch for while using this medicine? Visit your doctor or healthcare professional regularly. Tell your doctor or healthcare professional if your symptoms do not start to get better or if they get worse. You may need blood work done while you are taking this medicine. You may need to follow a special diet. Talk to your doctor. Foods that contain iron include: whole grains/cereals, dried fruits, beans, or peas, leafy green vegetables, and organ meats (liver, kidney). What side effects may I notice from receiving this medicine? Side effects that you should report to your doctor or health care professional as soon as possible:  allergic reactions like skin rash, itching or hives, swelling of the face, lips, or tongue  breathing problems  changes in blood pressure  cough  fast, irregular heartbeat  feeling faint or lightheaded, falls  fever or chills  flushing, sweating, or hot feelings  joint or muscle aches/pains  seizures  swelling of the ankles or feet  unusually weak or tired Side effects that usually do not require medical attention (report to your doctor or health care professional if they continue or are bothersome):  diarrhea  feeling achy  headache  irritation at site where injected  nausea, vomiting  stomach upset  tiredness This list may not describe all possible side effects. Call your doctor for medical advice about side effects. You may report side effects to FDA at 1-800-FDA-1088. Where should I keep   my medicine? This drug is given in a hospital or clinic and will not be stored at home. NOTE: This sheet is a summary. It may not cover all possible information. If you have questions about this medicine, talk to your doctor, pharmacist, or health care provider.  2021 Elsevier/Gold Standard (2010-12-03  17:14:35) Blood Transfusion, Adult, Care After This sheet gives you information about how to care for yourself after your procedure. Your doctor may also give you more specific instructions. If you have problems or questions, contact your doctor. What can I expect after the procedure? After the procedure, it is common to have:  Bruising and soreness at the IV site.  A fever or chills on the day of the procedure. This may be your body's response to the new blood cells received.  A headache. Follow these instructions at home: Insertion site care  Follow instructions from your doctor about how to take care of your insertion site. This is where an IV tube was put into your vein. Make sure you: ? Wash your hands with soap and water before and after you change your bandage (dressing). If you cannot use soap and water, use hand sanitizer. ? Change your bandage as told by your doctor.  Check your insertion site every day for signs of infection. Check for: ? Redness, swelling, or pain. ? Bleeding from the site. ? Warmth. ? Pus or a bad smell.      General instructions  Take over-the-counter and prescription medicines only as told by your doctor.  Rest as told by your doctor.  Go back to your normal activities as told by your doctor.  Keep all follow-up visits as told by your doctor. This is important. Contact a doctor if:  You have itching or red, swollen areas of skin (hives).  You feel worried or nervous (anxious).  You feel weak after doing your normal activities.  You have redness, swelling, warmth, or pain around the insertion site.  You have blood coming from the insertion site, and the blood does not stop with pressure.  You have pus or a bad smell coming from the insertion site. Get help right away if:  You have signs of a serious reaction. This may be coming from an allergy or the body's defense system (immune system). Signs include: ? Trouble breathing or shortness  of breath. ? Swelling of the face or feeling warm (flushed). ? Fever or chills. ? Head, chest, or back pain. ? Dark pee (urine) or blood in the pee. ? Widespread rash. ? Fast heartbeat. ? Feeling dizzy or light-headed. You may receive your blood transfusion in an outpatient setting. If so, you will be told whom to contact to report any reactions. These symptoms may be an emergency. Do not wait to see if the symptoms will go away. Get medical help right away. Call your local emergency services (911 in the [place] Do not drive yourself to the hospital. Summary  Bruising and soreness at the IV site are common.  Check your insertion site every day for signs of infection.  Rest as told by your doctor. Go back to your normal activities as told by your doctor.  Get help right away if you have signs of a serious reaction. This information is not intended to replace advice given to you by your health care provider. Make sure you discuss any questions you have with your health care provider. Document Revised: 08/17/2018 Document Reviewed: 08/17/2018 Elsevier Patient Education  2021 Elsevier Inc.  

## 2020-04-10 ENCOUNTER — Other Ambulatory Visit: Payer: Self-pay | Admitting: Oncology

## 2020-04-10 LAB — TYPE AND SCREEN
ABO/RH(D): A POS
Antibody Screen: NEGATIVE
Unit division: 0

## 2020-04-10 LAB — BPAM RBC
Blood Product Expiration Date: 202202212359
ISSUE DATE / TIME: 202202021355
Unit Type and Rh: 6200

## 2020-04-15 ENCOUNTER — Telehealth: Payer: Self-pay

## 2020-04-15 NOTE — Telephone Encounter (Signed)
Nutrition Follow-up:   Patient with iron deficiency anemia.  Followed by Dr Clelia Croft.    Spoke with daughter for nutrition follow-up.  Daughter said she only had a few minutes to talk.  Reports that she has been experimenting with different foods to see if patient will eat it.  States that ensure plus sometimes upsets patients stomach and planning to try carnation instant breakfast mixed with 2% milk.  Daughter has found a yogurt that patient enjoys and planning to try tapioca pudding.    Medications: reviewed  Labs: reviewed  Anthropometrics:   Weight 126 lb 11.2 decreased from 131 on 11/30.     NUTRITION DIAGNOSIS: Inadequate oral intake continues   INTERVENTION:  Encouraged daughter to continue offering high calorie, high protein foods for patient Agree with trying carnation instant breakfast to see if patient able to tolerate better Daughter denied additional questions or concerns at this time.  RD provided contact information and daughter will contact if needed in the future.    NEXT VISIT: no follow-up Dtr to call with questions  Sujey Gundry B. Freida Busman, RD, LDN Registered Dietitian [phone] (mobile)

## 2020-04-16 ENCOUNTER — Other Ambulatory Visit: Payer: Self-pay

## 2020-04-16 ENCOUNTER — Inpatient Hospital Stay: Payer: Medicare Other

## 2020-04-16 VITALS — BP 143/67 | HR 100 | Temp 99.1°F | Resp 18

## 2020-04-16 DIAGNOSIS — D509 Iron deficiency anemia, unspecified: Secondary | ICD-10-CM | POA: Diagnosis not present

## 2020-04-16 DIAGNOSIS — N189 Chronic kidney disease, unspecified: Secondary | ICD-10-CM | POA: Diagnosis not present

## 2020-04-16 DIAGNOSIS — D631 Anemia in chronic kidney disease: Secondary | ICD-10-CM

## 2020-04-16 DIAGNOSIS — I129 Hypertensive chronic kidney disease with stage 1 through stage 4 chronic kidney disease, or unspecified chronic kidney disease: Secondary | ICD-10-CM | POA: Diagnosis not present

## 2020-04-16 DIAGNOSIS — Z79899 Other long term (current) drug therapy: Secondary | ICD-10-CM | POA: Diagnosis not present

## 2020-04-16 DIAGNOSIS — D649 Anemia, unspecified: Secondary | ICD-10-CM

## 2020-04-16 MED ORDER — SODIUM CHLORIDE 0.9 % IV SOLN
Freq: Once | INTRAVENOUS | Status: AC
Start: 2020-04-16 — End: 2020-04-16
  Filled 2020-04-16: qty 250

## 2020-04-16 MED ORDER — SODIUM CHLORIDE 0.9 % IV SOLN
125.0000 mg | Freq: Once | INTRAVENOUS | Status: AC
  Administered 2020-04-16: 125 mg via INTRAVENOUS
  Filled 2020-04-16: qty 10

## 2020-04-16 NOTE — Patient Instructions (Signed)
Sodium Ferric Gluconate Complex injection What is this medicine? SODIUM FERRIC GLUCONATE COMPLEX (SOE dee um FER ik GLOO koe nate KOM pleks) is an iron replacement. It is used with epoetin therapy to treat low iron levels in patients who are receiving hemodialysis. This medicine may be used for other purposes; ask your health care provider or pharmacist if you have questions. COMMON BRAND NAME(S): Ferrlecit, Nulecit What should I tell my health care provider before I take this medicine? They need to know if you have any of the following conditions:  anemia that is not from iron deficiency  high levels of iron in the body  an unusual or allergic reaction to iron, benzyl alcohol, other medicines, foods, dyes, or preservatives  pregnant or are trying to become pregnant  breast-feeding How should I use this medicine? This medicine is for infusion into a vein. It is given by a health care professional in a hospital or clinic setting. Talk to your pediatrician regarding the use of this medicine in children. While this drug may be prescribed for children as young as 6 years old for selected conditions, precautions do apply. Overdosage: If you think you have taken too much of this medicine contact a poison control center or emergency room at once. NOTE: This medicine is only for you. Do not share this medicine with others. What if I miss a dose? It is important not to miss your dose. Call your doctor or health care professional if you are unable to keep an appointment. What may interact with this medicine? Do not take this medicine with any of the following medications:  deferoxamine  dimercaprol  other iron products This medicine may also interact with the following medications:  chloramphenicol  deferasirox  medicine for blood pressure like enalapril This list may not describe all possible interactions. Give your health care provider a list of all the medicines, herbs,  non-prescription drugs, or dietary supplements you use. Also tell them if you smoke, drink alcohol, or use illegal drugs. Some items may interact with your medicine. What should I watch for while using this medicine? Your condition will be monitored carefully while you are receiving this medicine. Visit your doctor for check-ups as directed. What side effects may I notice from receiving this medicine? Side effects that you should report to your doctor or health care professional as soon as possible:  allergic reactions like skin rash, itching or hives, swelling of the face, lips, or tongue  breathing problems  changes in hearing  changes in vision  chills, flushing, or sweating  fast, irregular heartbeat  feeling faint or lightheaded, falls  fever, flu-like symptoms  high or low blood pressure  pain, tingling, numbness in the hands or feet  severe pain in the chest, back, flanks, or groin  swelling of the ankles, feet, hands  trouble passing urine or change in the amount of urine  unusually weak or tired Side effects that usually do not require medical attention (report to your doctor or health care professional if they continue or are bothersome):  cramps  dark colored stools  diarrhea  headache  nausea, vomiting  stomach upset This list may not describe all possible side effects. Call your doctor for medical advice about side effects. You may report side effects to FDA at 1-800-FDA-1088. Where should I keep my medicine? This drug is given in a hospital or clinic and will not be stored at home. NOTE: This sheet is a summary. It may not cover all   possible information. If you have questions about this medicine, talk to your doctor, pharmacist, or health care provider.  2021 Elsevier/Gold Standard (2007-10-25 15:58:57)   

## 2020-04-28 ENCOUNTER — Other Ambulatory Visit: Payer: Medicare Other

## 2020-04-28 ENCOUNTER — Ambulatory Visit: Payer: Medicare Other

## 2020-04-29 ENCOUNTER — Inpatient Hospital Stay: Payer: Medicare Other

## 2020-04-29 ENCOUNTER — Other Ambulatory Visit: Payer: Self-pay

## 2020-04-29 VITALS — BP 145/76 | HR 111 | Resp 18 | Wt 124.3 lb

## 2020-04-29 DIAGNOSIS — D631 Anemia in chronic kidney disease: Secondary | ICD-10-CM | POA: Diagnosis not present

## 2020-04-29 DIAGNOSIS — D509 Iron deficiency anemia, unspecified: Secondary | ICD-10-CM | POA: Diagnosis not present

## 2020-04-29 DIAGNOSIS — I129 Hypertensive chronic kidney disease with stage 1 through stage 4 chronic kidney disease, or unspecified chronic kidney disease: Secondary | ICD-10-CM | POA: Diagnosis not present

## 2020-04-29 DIAGNOSIS — N189 Chronic kidney disease, unspecified: Secondary | ICD-10-CM

## 2020-04-29 DIAGNOSIS — Z79899 Other long term (current) drug therapy: Secondary | ICD-10-CM | POA: Diagnosis not present

## 2020-04-29 DIAGNOSIS — D649 Anemia, unspecified: Secondary | ICD-10-CM

## 2020-04-29 LAB — CBC WITH DIFFERENTIAL (CANCER CENTER ONLY)
Abs Immature Granulocytes: 0.04 10*3/uL (ref 0.00–0.07)
Basophils Absolute: 0.1 10*3/uL (ref 0.0–0.1)
Basophils Relative: 1 %
Eosinophils Absolute: 0.1 10*3/uL (ref 0.0–0.5)
Eosinophils Relative: 1 %
HCT: 26.7 % — ABNORMAL LOW (ref 36.0–46.0)
Hemoglobin: 8.2 g/dL — ABNORMAL LOW (ref 12.0–15.0)
Immature Granulocytes: 1 %
Lymphocytes Relative: 6 %
Lymphs Abs: 0.5 10*3/uL — ABNORMAL LOW (ref 0.7–4.0)
MCH: 29.3 pg (ref 26.0–34.0)
MCHC: 30.7 g/dL (ref 30.0–36.0)
MCV: 95.4 fL (ref 80.0–100.0)
Monocytes Absolute: 0.8 10*3/uL (ref 0.1–1.0)
Monocytes Relative: 9 %
Neutro Abs: 6.9 10*3/uL (ref 1.7–7.7)
Neutrophils Relative %: 82 %
Platelet Count: 300 10*3/uL (ref 150–400)
RBC: 2.8 MIL/uL — ABNORMAL LOW (ref 3.87–5.11)
RDW: 18.1 % — ABNORMAL HIGH (ref 11.5–15.5)
WBC Count: 8.3 10*3/uL (ref 4.0–10.5)
nRBC: 0 % (ref 0.0–0.2)

## 2020-04-29 LAB — SAMPLE TO BLOOD BANK

## 2020-04-29 MED ORDER — DARBEPOETIN ALFA 300 MCG/0.6ML IJ SOSY
300.0000 ug | PREFILLED_SYRINGE | Freq: Once | INTRAMUSCULAR | Status: AC
  Administered 2020-04-29: 300 ug via SUBCUTANEOUS

## 2020-04-29 MED ORDER — DARBEPOETIN ALFA 300 MCG/0.6ML IJ SOSY
PREFILLED_SYRINGE | INTRAMUSCULAR | Status: AC
  Filled 2020-04-29: qty 0.6

## 2020-04-30 LAB — IRON AND TIBC
Iron: 29 ug/dL — ABNORMAL LOW (ref 41–142)
Saturation Ratios: 15 % — ABNORMAL LOW (ref 21–57)
TIBC: 188 ug/dL — ABNORMAL LOW (ref 236–444)
UIBC: 159 ug/dL (ref 120–384)

## 2020-04-30 LAB — FERRITIN: Ferritin: 123 ng/mL (ref 11–307)

## 2020-05-06 DIAGNOSIS — M79672 Pain in left foot: Secondary | ICD-10-CM | POA: Diagnosis not present

## 2020-05-06 DIAGNOSIS — L84 Corns and callosities: Secondary | ICD-10-CM | POA: Diagnosis not present

## 2020-05-06 DIAGNOSIS — B351 Tinea unguium: Secondary | ICD-10-CM | POA: Diagnosis not present

## 2020-05-06 DIAGNOSIS — M79671 Pain in right foot: Secondary | ICD-10-CM | POA: Diagnosis not present

## 2020-05-20 ENCOUNTER — Ambulatory Visit: Payer: Medicare Other | Admitting: Family Medicine

## 2020-05-21 ENCOUNTER — Inpatient Hospital Stay: Payer: Medicare Other

## 2020-05-21 ENCOUNTER — Other Ambulatory Visit: Payer: Self-pay

## 2020-05-21 ENCOUNTER — Telehealth: Payer: Self-pay | Admitting: Oncology

## 2020-05-21 ENCOUNTER — Telehealth: Payer: Self-pay | Admitting: Family Medicine

## 2020-05-21 ENCOUNTER — Inpatient Hospital Stay: Payer: Medicare Other | Attending: Oncology

## 2020-05-21 ENCOUNTER — Telehealth: Payer: Self-pay

## 2020-05-21 VITALS — BP 141/67 | HR 125

## 2020-05-21 DIAGNOSIS — D649 Anemia, unspecified: Secondary | ICD-10-CM

## 2020-05-21 DIAGNOSIS — D631 Anemia in chronic kidney disease: Secondary | ICD-10-CM | POA: Diagnosis not present

## 2020-05-21 DIAGNOSIS — N189 Chronic kidney disease, unspecified: Secondary | ICD-10-CM | POA: Diagnosis not present

## 2020-05-21 DIAGNOSIS — D509 Iron deficiency anemia, unspecified: Secondary | ICD-10-CM

## 2020-05-21 DIAGNOSIS — Z79899 Other long term (current) drug therapy: Secondary | ICD-10-CM | POA: Insufficient documentation

## 2020-05-21 DIAGNOSIS — I129 Hypertensive chronic kidney disease with stage 1 through stage 4 chronic kidney disease, or unspecified chronic kidney disease: Secondary | ICD-10-CM | POA: Diagnosis not present

## 2020-05-21 LAB — CBC WITH DIFFERENTIAL (CANCER CENTER ONLY)
Abs Immature Granulocytes: 0.03 10*3/uL (ref 0.00–0.07)
Basophils Absolute: 0 10*3/uL (ref 0.0–0.1)
Basophils Relative: 0 %
Eosinophils Absolute: 0.1 10*3/uL (ref 0.0–0.5)
Eosinophils Relative: 1 %
HCT: 20.1 % — ABNORMAL LOW (ref 36.0–46.0)
Hemoglobin: 6 g/dL — CL (ref 12.0–15.0)
Immature Granulocytes: 0 %
Lymphocytes Relative: 5 %
Lymphs Abs: 0.4 10*3/uL — ABNORMAL LOW (ref 0.7–4.0)
MCH: 27.4 pg (ref 26.0–34.0)
MCHC: 29.9 g/dL — ABNORMAL LOW (ref 30.0–36.0)
MCV: 91.8 fL (ref 80.0–100.0)
Monocytes Absolute: 0.9 10*3/uL (ref 0.1–1.0)
Monocytes Relative: 11 %
Neutro Abs: 6.7 10*3/uL (ref 1.7–7.7)
Neutrophils Relative %: 83 %
Platelet Count: 325 10*3/uL (ref 150–400)
RBC: 2.19 MIL/uL — ABNORMAL LOW (ref 3.87–5.11)
RDW: 17.7 % — ABNORMAL HIGH (ref 11.5–15.5)
WBC Count: 8.1 10*3/uL (ref 4.0–10.5)
nRBC: 0 % (ref 0.0–0.2)

## 2020-05-21 LAB — PREPARE RBC (CROSSMATCH)

## 2020-05-21 LAB — SAMPLE TO BLOOD BANK

## 2020-05-21 MED ORDER — DARBEPOETIN ALFA 300 MCG/0.6ML IJ SOSY
PREFILLED_SYRINGE | INTRAMUSCULAR | Status: AC
  Filled 2020-05-21: qty 0.6

## 2020-05-21 MED ORDER — DARBEPOETIN ALFA 300 MCG/0.6ML IJ SOSY
300.0000 ug | PREFILLED_SYRINGE | Freq: Once | INTRAMUSCULAR | Status: AC
  Administered 2020-05-21: 300 ug via SUBCUTANEOUS

## 2020-05-21 NOTE — Telephone Encounter (Signed)
I called daughter, pt is having increasening tremors (last month) in both heands, R > L, making it difficult to eat and do ADL's is having caregivers in now weekly for her. Has appt 06-05-20, asking if would allow if safe for pt to increase primidone prior to coming in.  No available earlier appt in pm.  Please advise.

## 2020-05-21 NOTE — Telephone Encounter (Signed)
Scheduled per 03/16 scheduled message, providers nurse told me she is aware.

## 2020-05-21 NOTE — Progress Notes (Signed)
Patient is scheduled for 2 units of blood on 3/19 per Dr. Clelia Croft. Patient and Patien'ts daughter were made aware not to remove blue blood bank bracelet. Confirmed with Misty Stanley in blood bank who confirmed that she could see the orders.

## 2020-05-21 NOTE — Telephone Encounter (Signed)
I would be happy to concern. My biggest concerns is that Lisa [name] reported at last visit that she could not tolerate an increased dose do to sleepiness. We increased her RLS medication, pramipexole, to 0.25mg  BID. Has this helped at all? I would like to see her and discuss pros/cons first if she is willing. TY.

## 2020-05-21 NOTE — Telephone Encounter (Signed)
I called daughter and relayed message.  She stated had seen oncologist has low hemoglobin, possibly triggering increase tremors, will wait for 2 wk appt to discuss.  Will be receiving 2 units blood- FYI.

## 2020-05-21 NOTE — Telephone Encounter (Addendum)
CRITICAL VALUE STICKER  CRITICAL VALUE: Hgb = 6.0  RECEIVER (on-site recipient of call): Ellen Henri, CMA  DATE & TIME NOTIFIED: 05/21/20 AT 3:33PM  MESSENGER (representative from lab): Vonna Kotyk  MD NOTIFIED: Dr. Clelia Croft  TIME OF NOTIFICATION: 05/21/20 AT 3:33PM  RESPONSE: Notification given to Gloriajean Dell, CMA for follow-up with provider.

## 2020-05-21 NOTE — Telephone Encounter (Signed)
Daughter([name]) asking for a call to discuss possibly increasing pt's medication for tremors

## 2020-05-22 ENCOUNTER — Telehealth: Payer: Self-pay

## 2020-05-22 LAB — IRON AND TIBC
Iron: 16 ug/dL — ABNORMAL LOW (ref 41–142)
Saturation Ratios: 6 % — ABNORMAL LOW (ref 21–57)
TIBC: 250 ug/dL (ref 236–444)
UIBC: 234 ug/dL (ref 120–384)

## 2020-05-22 LAB — FERRITIN: Ferritin: 26 ng/mL (ref 11–307)

## 2020-05-22 NOTE — Telephone Encounter (Signed)
Lisa [name] was reassured after hearing [name] advice and verbalized understanding. I advised her to contact us with any further concerns going forward.

## 2020-05-22 NOTE — Telephone Encounter (Signed)
-----   Message from Benjiman Core, MD sent at 05/22/2020 12:54 PM EDT ----- I do not see any clear changes in her condition.  The requirement for blood or iron can fluctuates with time.  The drop in her hemoglobin and iron could be a function of poor nutritional status among other things.  We can address further at her next appointment.  I do not believe a move is risky at this point but we can discuss further at next appointment. ----- Message ----- From: Dionicio Stall, CMA Sent: 05/22/2020  12:49 PM EDT To: Benjiman Core, MD  Dr. Clelia Croft,  Lisa [name] has called today expressing concern for her Mother and would like to know what your thoughts/recommendations are in regards to her recent drop in Hgb and needing blood transfusions just 6 weeks apart. She states that this seems to be a "set back" and would like to know "where things stand".   Lisa [name] would like to know if you feel like the patient should be seen sooner than her next appointment with you on 4/5.  She states that she has spoken with you about making a potential move to [facility] [place] where she would also be moving Lisa [name] to. Lisa [name] would like to know if you think a move at this point in time would be too risky for the patient given the change in her health.  Please advise. Thanks.

## 2020-05-24 ENCOUNTER — Other Ambulatory Visit: Payer: Self-pay

## 2020-05-24 ENCOUNTER — Inpatient Hospital Stay: Payer: Medicare Other

## 2020-05-24 DIAGNOSIS — Z79899 Other long term (current) drug therapy: Secondary | ICD-10-CM | POA: Diagnosis not present

## 2020-05-24 DIAGNOSIS — D631 Anemia in chronic kidney disease: Secondary | ICD-10-CM | POA: Diagnosis not present

## 2020-05-24 DIAGNOSIS — N189 Chronic kidney disease, unspecified: Secondary | ICD-10-CM | POA: Diagnosis not present

## 2020-05-24 DIAGNOSIS — I129 Hypertensive chronic kidney disease with stage 1 through stage 4 chronic kidney disease, or unspecified chronic kidney disease: Secondary | ICD-10-CM | POA: Diagnosis not present

## 2020-05-24 MED ORDER — DIPHENHYDRAMINE HCL 25 MG PO CAPS
25.0000 mg | ORAL_CAPSULE | Freq: Once | ORAL | Status: AC
  Administered 2020-05-24: 25 mg via ORAL

## 2020-05-24 MED ORDER — ACETAMINOPHEN 325 MG PO TABS
650.0000 mg | ORAL_TABLET | Freq: Once | ORAL | Status: AC
  Administered 2020-05-24: 650 mg via ORAL

## 2020-05-24 MED ORDER — SODIUM CHLORIDE 0.9% IV SOLUTION
250.0000 mL | Freq: Once | INTRAVENOUS | Status: AC
  Administered 2020-05-24: 250 mL via INTRAVENOUS
  Filled 2020-05-24: qty 250

## 2020-05-24 NOTE — Patient Instructions (Signed)

## 2020-05-25 LAB — BPAM RBC
Blood Product Expiration Date: 202204072359
Blood Product Expiration Date: 202204122359
ISSUE DATE / TIME: 202203190906
ISSUE DATE / TIME: 202203190906
Unit Type and Rh: 6200
Unit Type and Rh: 6200

## 2020-05-25 LAB — TYPE AND SCREEN
ABO/RH(D): A POS
Antibody Screen: NEGATIVE
Unit division: 0
Unit division: 0

## 2020-05-26 DIAGNOSIS — L97229 Non-pressure chronic ulcer of left calf with unspecified severity: Secondary | ICD-10-CM | POA: Diagnosis not present

## 2020-05-26 DIAGNOSIS — R600 Localized edema: Secondary | ICD-10-CM | POA: Diagnosis not present

## 2020-06-05 ENCOUNTER — Other Ambulatory Visit: Payer: Self-pay

## 2020-06-05 ENCOUNTER — Encounter: Payer: Self-pay | Admitting: Family Medicine

## 2020-06-05 ENCOUNTER — Ambulatory Visit: Payer: Medicare Other | Admitting: Family Medicine

## 2020-06-05 VITALS — BP 134/74 | HR 101 | Ht 60.0 in | Wt 122.0 lb

## 2020-06-05 DIAGNOSIS — G250 Essential tremor: Secondary | ICD-10-CM | POA: Diagnosis not present

## 2020-06-05 DIAGNOSIS — G2581 Restless legs syndrome: Secondary | ICD-10-CM

## 2020-06-05 NOTE — Patient Instructions (Signed)
Below is our plan:  We will continue primidone 150mg  every night and pramipexole 0.25mg  twice daily.   Please make sure you are staying well hydrated. I recommend 50-60 ounces daily. Well balanced diet and regular exercise encouraged. Consistent sleep schedule with 6-8 hours recommended.   Please continue follow up with care team as directed.   Follow up in 6-12 months   You may receive a survey regarding today's visit. I encourage you to leave honest feed back as I do use this information to improve patient care. Thank you for seeing me today!      Restless Legs Syndrome Restless legs syndrome is a condition that causes uncomfortable feelings or sensations in the legs, especially while sitting or lying down. The sensations usually cause an overwhelming urge to move the legs. The arms can also sometimes be affected. The condition can range from mild to severe. The symptoms often interfere with a person's ability to sleep. What are the causes? The cause of this condition is not known. What increases the risk? The following factors may make you more likely to develop this condition:  Being older than 50.  Pregnancy.  Being a woman. In general, the condition is more common in women than in men.  A family history of the condition.  Having iron deficiency.  Overuse of caffeine, nicotine, or alcohol.  Certain medical conditions, such as kidney disease, Parkinson's disease, or nerve damage.  Certain medicines, such as those for high blood pressure, nausea, colds, allergies, depression, and some heart conditions. What are the signs or symptoms? The main symptom of this condition is uncomfortable sensations in the legs, such as:  Pulling.  Tingling.  Prickling.  Throbbing.  Crawling.  Burning. Usually, the sensations:  Affect both sides of the body.  Are worse when you sit or lie down.  Are worse at night. These may wake you up or make it difficult to fall  asleep.  Make you have a strong urge to move your legs.  Are temporarily relieved by moving your legs. The arms can also be affected, but this is rare. People who have this condition often have tiredness during the day because of their lack of sleep at night. How is this diagnosed? This condition may be diagnosed based on:  Your symptoms.  Blood tests. In some cases, you may be monitored in a sleep lab by a specialist (a sleep study). This can detect any disruptions in your sleep. How is this treated? This condition is treated by managing the symptoms. This may include:  Lifestyle changes, such as exercising, using relaxation techniques, and avoiding caffeine, alcohol, or tobacco.  Medicines. Anti-seizure medicines may be tried first. Follow these instructions at home: General instructions  Take over-the-counter and prescription medicines only as told by your health care provider.  Use methods to help relieve the uncomfortable sensations, such as: ? Massaging your legs. ? Walking or stretching. ? Taking a cold or hot bath.  Keep all follow-up visits as told by your health care provider. This is important. Lifestyle  Practice good sleep habits. For example, go to bed and get up at the same time every day. Most adults should get 7-9 hours of sleep each night.  Exercise regularly. Try to get at least 30 minutes of exercise most days of the week.  Practice ways of relaxing, such as yoga or meditation.  Avoid caffeine and alcohol.  Do not use any products that contain nicotine or tobacco, such as cigarettes and e-cigarettes.  If you need help quitting, ask your health care provider.      Contact a health care provider if:  Your symptoms get worse or they do not improve with treatment. Summary  Restless legs syndrome is a condition that causes uncomfortable feelings or sensations in the legs, especially while sitting or lying down.  The symptoms often interfere with a  person's ability to sleep.  This condition is treated by managing the symptoms. You may need to make lifestyle changes or take medicines. This information is not intended to replace advice given to you by your health care provider. Make sure you discuss any questions you have with your health care provider. Document Revised: 04/13/2019 Document Reviewed: 03/14/2017 Elsevier Patient Education  2021 [employer].

## 2020-06-05 NOTE — Progress Notes (Addendum)
PATIENT: Lisa [name] DOB: May 12, 1923  REASON FOR VISIT: follow up HISTORY FROM: patient  Chief Complaint  Patient presents with  . Follow-up    Rm 2 with daughter Britta Mccreedy) Pt is well, still having tremors, seems to be more in R hand, face and head. Some days are worse that others, having lower extremity swelling      HISTORY OF PRESENT ILLNESS: 06/05/20 ALL: She returns for follow up for tremor and RLS. She continues primidone 150mg  daily and pramipexole 0.25mg  BID. She does feel that increased pramipexole dose to twice dialy as helped RLS. She is sleeping well. Tremor has worsened. She is noticing more head and voice tremor. Both hands tremor almost all the time. Worse with eating. She would like to work with OT for adaptive treatment options. She is very hesitant to increase medications for fears of dizziness/falls. She denies changes with gait. Rare dizziness. No falls. She is using Rolator. She plans to move back in with her daughter versus assisted living with [facility]. She is followed closely by hematology for anemia. She has had multiple transfusions this month for Hgb of 6.    11/20/2019 ALL:  Lisa [name] is a 87 y.o. female here today for follow up for tremor. She continues primidone 150mg  at bedtime. She has taken this since 2017. Propranolol was discontinued in 2017 due to reports of feeling tired. She continues to have tremor of face, chin and hands. She does not feel she can tolerate increased dose of primidone.   PCP started pramipexole 0.25mg  2-3 years ago. She initially felt that medication worked well. Over the past few months, RLS has worsened. She complains of her legs jumping when she is sitting in her chair. She is trying to be more active. She denies adverse effects with pramipexole. She denies falls. She continues treatment for anemia. She is followed by hematology with regular iron treatment.    HISTORY: (copied from Dr 2018 note on  11/24/2017)  Interval history: propranolol is making her tired, will discontinue. Her hands are still better. But her chin tremor is ongoing. She is seeing orthopaedics for injections into CTS. She wears braces at night. She cannot tolerate more primidone. She would not like to try any medications for tremor, today we discussed Carpal Tunnel syndrome a new problem, etiology, diagnosis and therapy. Discussed emg/ncs  Interval history 10/09/2015: She is on 3 pills of primidone at night. The hand tremor is better. The chin tremor is still bad. Some sedation and feeling sluggish likely die to the primidone, discussed, cannot increase here. Discussed other medications and possibly a very low dose b-blocker. She is sleeping later in the morning. She goes to be at 11pm and 1130. She takes the pills at 10pm. She wakes up at 830. She feels sluggish. Will ask her to take primidone a little earlier. Cannot increase at this time. Discussed other options, decided on propranolol very low dose and discussed side effects at length. Continue primidone at current dose.    Interval history:She is eating more and better. She is on more of a routine at the nursing home. She is on a whole pill of primidone at night. She does not feel that the primidone is helping her however she is only on 50 mg at night. Discussed that we may have to increase this medication more than not to see results. Propranolol the past helped but was discontinued due to side effects. Discussed with daughter and patient again.She denies any  side effects at all to the primidone. Let daughter know that she doesn't have to wait several months if the medication is not working, they can call me and I'm happy to increase it. We'll plan on increasing the primidone very slowly to 100 mg at night. They understand that we may have to go up much more than that and that this may be limited by side effects. Can cause sedation in the middle of the night, advised  patient to keep lights on, and if she has any side effects or any increased sedation that she should go back to the last dose of the primidone that was tolerated. Discussed other medications we could try including Neurontin, Keppra, Topamax for essential tremor. Propranolol and primidone are the most effective however Neurontin often works as well.  HPI: Lisa [name] is a 87 y.o. female here as a referral from Dr. Zachery DauerBarnes for tremor. PMHx of CAP, asymptomatic carotid artery disease, Chronic venous insufficiency, Acute resp failure with hypoxia, HTN, dizziness, glucose intolerance, osteoporosis, benign essential tremor, anemia, allergic rhinitis,, HLD, glaucoma. She has had a tremor for years. Started 10 years ago in the right hand and then to the left hand then to the face. Worsening over the last year. No pain just very annoying. Continuous. It is embarrassing. She was on a b-blocker and that was discontinued. She was dizzy on the b-blocker with hypotension. The beta blocker help the tremor, Worsened since stopping the b-blocker. The tremor is continuous. Her mohter had a tremor as well as her brother. Slowly progressive. Daughter is with her and also provides information.  Reviewed notes, labs and imaging from outside physicians, which showed:  Cbc with anemia (9.5/28.1), BMP unremarkable, B12 745, TSH 3.120  MRI of the brain 2005: MRI BRAIN WO/W CONTRAST 15 cc IV Omniscan. No comparison. There is moderate cortical atrophy. There are scattered small subcortical and periventricular white matter hyperintensities as well as a 1 cm hyperintensity in the left inferior temporal lobe. 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 There also is some minimal patchy hyperintensity in the pons bilaterally compatible with chronic ischemic change. Diffusion weighted imaging is negative for acute infarct. The enhancement pattern is normal and there is no mass. IMPRESSION Chronic small vessel ischemic changes.  No acute abnormalities  [facility]. She also has dyslipidemia and impaired fasting glucose. Patient (solving [facility]. Spent a good move for her. She enjoys activities in the company. She the dining hall twice a day is probably eating better but is also exercising more. She has a history of benign essential tremor primarily in her jaw. She was on a beta blocker without discontinued due to low blood pressure and becoming dizzy. He frequently clears her throat has a lot of sneezing or blowing her nose.    REVIEW OF SYSTEMS: Out of a complete 14 system review of symptoms, the patient complains only of the following symptoms, tremor, restless legs,  and all other reviewed systems are negative.  ALLERGIES: Allergies  Allergen Reactions  . Mycostatin [Nystatin] Swelling and Rash    HOME MEDICATIONS: Outpatient Medications Prior to Visit  Medication Sig Dispense Refill  . acetaminophen (TYLENOL) 500 MG tablet Take 500-1,000 mg by mouth at bedtime.    Marland Kitchen. aspirin EC 81 MG tablet Take 81 mg by mouth daily.    . Iron-Vitamin C (IRON 100/C) 100-250 MG TABS Take by mouth as needed.    . mupirocin ointment (BACTROBAN) 2 % Apply topically 2 (two) times daily.    .Marland Kitchen  OVER THE COUNTER MEDICATION Apply topically as needed. Cream for arthritic pain. Patient unable to recall the name at this time.    . pramipexole (MIRAPEX) 0.25 MG tablet Take 1 tablet (0.25 mg total) by mouth in the morning and at bedtime. 180 tablet 3  . primidone (MYSOLINE) 50 MG tablet Take 3 tablets (150 mg total) by mouth at bedtime. 270 tablet 3  . raloxifene (EVISTA) 60 MG tablet Take 60 mg by mouth at bedtime.     . timolol (TIMOPTIC) 0.5 % ophthalmic solution Place 1 drop into both eyes daily.   1  . UNABLE TO FIND Med Name: Aranesp infusions every 3 weeks administered by [facility]    . VYTORIN 10-20 MG tablet Take 1 tablet by mouth daily.  0  . calcium carbonate (OS-CAL) 600 MG TABS Take  600 mg by mouth daily.     . Multiple Vitamins-Minerals (MULTIVITAMIN WITH MINERALS) tablet Take 1 tablet by mouth daily.     No facility-administered medications prior to visit.    PAST MEDICAL HISTORY: Past Medical History:  Diagnosis Date  . Anemia    sees Dr. Clelia Croft @ [facility]  . Arthritis   . Carotid artery occlusion   . Cataract   . Glaucoma   . High cholesterol   . Hypertension   . Osteoporosis   . Pneumonia July 03, 2014  . Syncope 03/15/2016  . UTI (urinary tract infection)     PAST SURGICAL HISTORY: Past Surgical History:  Procedure Laterality Date  . ABDOMINAL HYSTERECTOMY  1969  . APPENDECTOMY    . EYE SURGERY      FAMILY HISTORY: Family History  Problem Relation Age of Onset  . Hypertension Mother   . Heart disease Mother   . Hyperlipidemia Brother   . Hypertension Brother     SOCIAL HISTORY: Social History   Socioeconomic History  . Marital status: Widowed    Spouse name: Not on file  . Number of children: 3  . Years of education: 34  . Highest education level: Bachelor's degree (e.g., BA, AB, BS)  Occupational History  . Occupation: retired Child psychotherapist  Tobacco Use  . Smoking status: Never Smoker  . Smokeless tobacco: Never Used  Vaping Use  . Vaping Use: Never used  Substance and Sexual Activity  . Alcohol use: Not Currently    Alcohol/week: 0.0 standard drinks    Comment: rare; about 3 sips every 2-3 months   . Drug use: No  . Sexual activity: Never  Other Topics Concern  . Not on file  Social History Narrative   Admitted to [facility] 03/10/16   Currently lives at [facility] independent living    Widowed   Never smoked   Living Will, POA   Social Determinants of Health   Financial Resource Strain: Not on file  Food Insecurity: Not on file  Transportation Needs: Not on file  Physical Activity: Not on file  Stress: Not on file  Social Connections: Not on file  Intimate  Partner Violence: Not on file      PHYSICAL EXAM  Vitals:   06/05/20 1420  BP: 134/74  Pulse: (!) 101  Weight: 122 lb (55.3 kg)  Height: 5' (1.524 m)   Body mass index is 23.83 kg/m.  Generalized: Well developed, in no acute distress  Cardiology: normal rate and rhythm, no murmur noted Respiratory: clear to auscultation bilaterally  Neurological examination  Mentation: Alert  oriented to time, place, history taking. Follows all commands speech and language fluent Cranial nerve II-XII: Pupils were equal round reactive to light. Extraocular movements were full, visual field were full on confrontational test. Facial sensation and strength were normal. Head turning and shoulder shrug  were normal and symmetric. Motor: The motor testing reveals 5 over 5 strength of bilateral extremities, 4/5 bilateral lowers. Good symmetric motor tone is noted throughout. Tremor noted of head and bilateral hands, right> left  Sensory: Sensory testing is intact to soft touch on all 4 extremities. No evidence of extinction is noted.  Gait and station: Gait is stable with walker    DIAGNOSTIC DATA (LABS, IMAGING, TESTING) - I reviewed patient records, labs, notes, testing and imaging myself where available.  No flowsheet data found.   Lab Results  Component Value Date   WBC 8.1 05/21/2020   HGB 6.0 (LL) 05/21/2020   HCT 20.1 (L) 05/21/2020   MCV 91.8 05/21/2020   PLT 325 05/21/2020      Component Value Date/Time   NA 140 05/10/2019 1437   NA 135 (A) 04/01/2016 0000   K 4.7 05/10/2019 1437   CL 104 05/10/2019 1437   CO2 29 05/10/2019 1437   GLUCOSE 116 (H) 05/10/2019 1437   BUN 21 05/10/2019 1437   BUN 19 04/01/2016 0000   CREATININE 0.84 05/10/2019 1437   CALCIUM 8.6 (L) 05/10/2019 1437   PROT 5.6 (L) 05/10/2019 1437   ALBUMIN 3.1 (L) 05/10/2019 1437   AST 18 05/10/2019 1437   ALT 16 05/10/2019 1437   ALKPHOS 75 05/10/2019 1437   BILITOT <0.2 (L) 05/10/2019 1437   GFRNONAA 60 (L)  05/10/2019 1437   GFRAA >60 05/10/2019 1437   No results found for: CHOL, HDL, LDLCALC, LDLDIRECT, TRIG, CHOLHDL No results found for: PZWC5E Lab Results  Component Value Date   VITAMINB12 427 10/05/2016   Lab Results  Component Value Date   TSH 0.666 03/07/2016      ASSESSMENT AND PLAN 87 y.o. year old female  has a past medical history of Anemia, Arthritis, Carotid artery occlusion, Cataract, Glaucoma, High cholesterol, Hypertension, Osteoporosis, Pneumonia (July 03, 2014), Syncope (03/15/2016), and UTI (urinary tract infection). here with     ICD-10-CM   1. Essential tremor  G25.0 Ambulatory referral to Occupational Therapy  2. Restless leg  G25.81      Lisa [name] is doing well, overall. She feels tremors are worsening but very hesitant about changing dose of primidone. She feels she can not tolerate increased doses due to sedation and concerns of dizziness. She is very fearful of falling. We will send referral to occupation therapy for help with adaptive therapy. She will continue primidone 150mg  at bedtime. RLS is beter on pramipexole 0.25mg  BID. She is tolerating increased dose. She was encouraged to stay well hydrated. Healthy lifestyle habits encouraged. She will follow up in 6-12 months, sooner if needed. She and her daughter verbalize understanding and agreement with this plan.    Orders Placed This Encounter  Procedures  . Ambulatory referral to Occupational Therapy    Referral Priority:   Routine    Referral Type:   Occupational Therapy    Referral Reason:   Specialty Services Required    Requested Specialty:   Occupational Therapy    Number of Visits Requested:   1     No orders of the defined types were placed in this encounter.     I spent 30 minutes with the patient. 50% of this  time was spent counseling and educating patient on plan of care and medications.    Shawnie Dapper, FNP-C 06/05/2020, 3:27 PM [facility] [address], [address] [place], [place] [address] [phone]   agree with assessment and plan Naomie Dean, MD

## 2020-06-09 DIAGNOSIS — I83009 Varicose veins of unspecified lower extremity with ulcer of unspecified site: Secondary | ICD-10-CM | POA: Diagnosis not present

## 2020-06-09 DIAGNOSIS — R600 Localized edema: Secondary | ICD-10-CM | POA: Diagnosis not present

## 2020-06-10 ENCOUNTER — Other Ambulatory Visit: Payer: Self-pay

## 2020-06-10 ENCOUNTER — Inpatient Hospital Stay: Payer: Medicare Other | Attending: Oncology | Admitting: Oncology

## 2020-06-10 ENCOUNTER — Inpatient Hospital Stay: Payer: Medicare Other

## 2020-06-10 VITALS — BP 131/66 | HR 103 | Temp 97.9°F | Resp 17 | Wt 120.3 lb

## 2020-06-10 DIAGNOSIS — Z7982 Long term (current) use of aspirin: Secondary | ICD-10-CM | POA: Insufficient documentation

## 2020-06-10 DIAGNOSIS — N189 Chronic kidney disease, unspecified: Secondary | ICD-10-CM | POA: Diagnosis not present

## 2020-06-10 DIAGNOSIS — D509 Iron deficiency anemia, unspecified: Secondary | ICD-10-CM | POA: Insufficient documentation

## 2020-06-10 DIAGNOSIS — D631 Anemia in chronic kidney disease: Secondary | ICD-10-CM | POA: Insufficient documentation

## 2020-06-10 DIAGNOSIS — I129 Hypertensive chronic kidney disease with stage 1 through stage 4 chronic kidney disease, or unspecified chronic kidney disease: Secondary | ICD-10-CM | POA: Diagnosis not present

## 2020-06-10 DIAGNOSIS — Z79899 Other long term (current) drug therapy: Secondary | ICD-10-CM | POA: Insufficient documentation

## 2020-06-10 DIAGNOSIS — D649 Anemia, unspecified: Secondary | ICD-10-CM

## 2020-06-10 LAB — SAMPLE TO BLOOD BANK

## 2020-06-10 LAB — CBC WITH DIFFERENTIAL (CANCER CENTER ONLY)
Abs Immature Granulocytes: 0.02 10*3/uL (ref 0.00–0.07)
Basophils Absolute: 0 10*3/uL (ref 0.0–0.1)
Basophils Relative: 1 %
Eosinophils Absolute: 0.1 10*3/uL (ref 0.0–0.5)
Eosinophils Relative: 1 %
HCT: 24.9 % — ABNORMAL LOW (ref 36.0–46.0)
Hemoglobin: 7.5 g/dL — ABNORMAL LOW (ref 12.0–15.0)
Immature Granulocytes: 0 %
Lymphocytes Relative: 6 %
Lymphs Abs: 0.4 10*3/uL — ABNORMAL LOW (ref 0.7–4.0)
MCH: 26.3 pg (ref 26.0–34.0)
MCHC: 30.1 g/dL (ref 30.0–36.0)
MCV: 87.4 fL (ref 80.0–100.0)
Monocytes Absolute: 0.7 10*3/uL (ref 0.1–1.0)
Monocytes Relative: 12 %
Neutro Abs: 5 10*3/uL (ref 1.7–7.7)
Neutrophils Relative %: 80 %
Platelet Count: 316 10*3/uL (ref 150–400)
RBC: 2.85 MIL/uL — ABNORMAL LOW (ref 3.87–5.11)
RDW: 16.8 % — ABNORMAL HIGH (ref 11.5–15.5)
WBC Count: 6.2 10*3/uL (ref 4.0–10.5)
nRBC: 0 % (ref 0.0–0.2)

## 2020-06-10 LAB — FERRITIN: Ferritin: 19 ng/mL (ref 11–307)

## 2020-06-10 LAB — IRON AND TIBC
Iron: 27 ug/dL — ABNORMAL LOW (ref 41–142)
Saturation Ratios: 10 % — ABNORMAL LOW (ref 21–57)
TIBC: 258 ug/dL (ref 236–444)
UIBC: 231 ug/dL (ref 120–384)

## 2020-06-10 MED ORDER — DARBEPOETIN ALFA 300 MCG/0.6ML IJ SOSY
PREFILLED_SYRINGE | INTRAMUSCULAR | Status: AC
  Filled 2020-06-10: qty 0.6

## 2020-06-10 MED ORDER — DARBEPOETIN ALFA 300 MCG/0.6ML IJ SOSY
300.0000 ug | PREFILLED_SYRINGE | Freq: Once | INTRAMUSCULAR | Status: AC
  Administered 2020-06-10: 300 ug via SUBCUTANEOUS

## 2020-06-10 NOTE — Progress Notes (Signed)
Hematology and Oncology Follow Up Visit  Lisa [name] [id number] 19-Sep-1923 87 y.o. 06/10/2020 12:32 PM Lisa [name], MDBarnes, Lisa Mola, MD   Principle Diagnosis: 87 year old woman with iron deficiency anemia diagnosed in 2018.  She has element of anemia of chronic disease and chronic renal disease anemia   Prior Therapy: IV iron infusion infusion as well as packed red cell transfusion.  Current therapy:   Aranesp 300 mcg every 3 weeks to keep her hemoglobin above 10.  Feraheme at 1000 mg every 6 to 9 weeks.  Packed red cell transfusion for hemoglobin above 8.  Interim History: [name] returns today for repeat evaluation.  Since her last visit, he reports no major changes in her health.  She denies any recent hospitalization or illnesses.  She did require 2 units of packed red cell transfusion with improvement in her hemoglobin.  She denies any diarrhea or excessive fatigue.  She denies any shortness of breath.  She has reported chronic lower extremity edema and venous stasis ulcer that has developed at this time.  Her mobility has been more and more limited.  She is planning to move in with her daughter permanently.                Medications: Unchanged on review. Current Outpatient Medications  Medication Sig Dispense Refill  . acetaminophen (TYLENOL) 500 MG tablet Take 500-1,000 mg by mouth at bedtime.    Marland Kitchen aspirin EC 81 MG tablet Take 81 mg by mouth daily.    . Iron-Vitamin C (IRON 100/C) 100-250 MG TABS Take by mouth as needed.    . mupirocin ointment (BACTROBAN) 2 % Apply topically 2 (two) times daily.    Marland Kitchen OVER THE COUNTER MEDICATION Apply topically as needed. Cream for arthritic pain. Patient unable to recall the name at this time.    . pramipexole (MIRAPEX) 0.25 MG tablet Take 1 tablet (0.25 mg total) by mouth in the morning and at bedtime. 180 tablet 3  . primidone (MYSOLINE) 50 MG tablet Take 3 tablets (150 mg total) by mouth at bedtime. 270 tablet 3   . raloxifene (EVISTA) 60 MG tablet Take 60 mg by mouth at bedtime.     . timolol (TIMOPTIC) 0.5 % ophthalmic solution Place 1 drop into both eyes daily.   1  . UNABLE TO FIND Med Name: Aranesp infusions every 3 weeks administered by [facility]    . VYTORIN 10-20 MG tablet Take 1 tablet by mouth daily.  0   No current facility-administered medications for this visit.     Allergies:  Allergies  Allergen Reactions  . Mycostatin [Nystatin] Swelling and Rash      Physical Exam:   Blood pressure 131/66, pulse (!) 103, temperature 97.9 F (36.6 C), temperature source Tympanic, resp. rate 17, weight 120 lb 4.8 oz (54.6 kg), SpO2 98 %.   ECOG: 1   General appearance: Alert, awake without any distress. Head: Atraumatic without abnormalities Oropharynx: Without any thrush or ulcers. Eyes: No scleral icterus. Lymph nodes: No lymphadenopathy noted in the cervical, supraclavicular, or axillary nodes Heart:regular rate and rhythm, without any murmurs or gallops.   Lung: Clear to auscultation without any rhonchi, wheezes or dullness to percussion. Abdomin: Soft, nontender without any shifting dullness or ascites. Musculoskeletal: No clubbing or cyanosis. Neurological: No motor or sensory deficits. Skin: No rashes or lesions.  .           Lab Results: Lab Results  Component Value Date   WBC  8.1 05/21/2020   HGB 6.0 (LL) 05/21/2020   HCT 20.1 (L) 05/21/2020   MCV 91.8 05/21/2020   PLT 325 05/21/2020     Chemistry      Component Value Date/Time   NA 140 05/10/2019 1437   NA 135 (A) 04/01/2016 0000   K 4.7 05/10/2019 1437   CL 104 05/10/2019 1437   CO2 29 05/10/2019 1437   BUN 21 05/10/2019 1437   BUN 19 04/01/2016 0000   CREATININE 0.84 05/10/2019 1437   GLU 102 04/01/2016 0000      Component Value Date/Time   CALCIUM 8.6 (L) 05/10/2019 1437   ALKPHOS 75 05/10/2019 1437   AST 18 05/10/2019 1437   ALT 16 05/10/2019 1437   BILITOT <0.2 (L)  05/10/2019 1437       Impression and Plan:  87 year old woman with:   1.  Anemia diagnosed in 2018.  Etiology is related to iron deficiency, chronic disease and chronic renal insufficiency.    She continues to receive supportive measures and required packed red cell transfusion on May 21, 2020.  She has also been receiving growth factor support in the form of Aranesp without any complications.  Risks and benefits of continuing this approach were reviewed.  Additional consideration for anemia would be myelodysplastic syndrome or infiltrative bone marrow disease.  She has refused bone marrow biopsy in the past and I do not think it is necessary at this time.  I do not think it will alter our management approach given her overall fragility and overall frail status.  Hemoglobin today is 7.5 which improved after transfusion but still low.  I recommend proceeding with Aranesp, and IV iron.  We will tentatively schedule I unit of packed red cell transfusion in 3 weeks based on her wishes.   2.  Iron deficiency: Related to chronic losses and poor absorption issues.  He received intravenous iron infusion in February and will be repeated in the near future.  Complications that include arthralgias, myalgias and infusion related complications were reiterated.  She received ferric gluconate which could have resulted in suboptimal iron stores replacement and I recommend proceeding with Feraheme instead with the next infusion.  3.  Follow-up: Every 3 weeks for Aranesp and possible IV iron infusion.  She will have MD follow-up in 9 weeks.   30 minutes were spent on this visit.  The time was dedicated to reviewing laboratory data, disease status update and outlining future plan of care.     Lisa Button, MD 4/5/202212:32 PM

## 2020-06-11 ENCOUNTER — Inpatient Hospital Stay: Payer: Medicare Other

## 2020-06-11 VITALS — BP 145/81 | HR 99 | Temp 98.6°F | Resp 20

## 2020-06-11 DIAGNOSIS — Z79899 Other long term (current) drug therapy: Secondary | ICD-10-CM | POA: Diagnosis not present

## 2020-06-11 DIAGNOSIS — D509 Iron deficiency anemia, unspecified: Secondary | ICD-10-CM | POA: Diagnosis not present

## 2020-06-11 DIAGNOSIS — D649 Anemia, unspecified: Secondary | ICD-10-CM

## 2020-06-11 DIAGNOSIS — Z7982 Long term (current) use of aspirin: Secondary | ICD-10-CM | POA: Diagnosis not present

## 2020-06-11 DIAGNOSIS — D631 Anemia in chronic kidney disease: Secondary | ICD-10-CM

## 2020-06-11 DIAGNOSIS — N189 Chronic kidney disease, unspecified: Secondary | ICD-10-CM | POA: Diagnosis not present

## 2020-06-11 DIAGNOSIS — I129 Hypertensive chronic kidney disease with stage 1 through stage 4 chronic kidney disease, or unspecified chronic kidney disease: Secondary | ICD-10-CM | POA: Diagnosis not present

## 2020-06-11 MED ORDER — SODIUM CHLORIDE 0.9 % IV SOLN
Freq: Once | INTRAVENOUS | Status: AC
  Filled 2020-06-11: qty 250

## 2020-06-11 MED ORDER — SODIUM CHLORIDE 0.9 % IV SOLN
510.0000 mg | Freq: Once | INTRAVENOUS | Status: AC
  Administered 2020-06-11: 510 mg via INTRAVENOUS
  Filled 2020-06-11: qty 510

## 2020-06-11 NOTE — Patient Instructions (Signed)
Ferumoxytol injection What is this medicine? FERUMOXYTOL is an iron complex. Iron is used to make healthy red blood cells, which carry oxygen and nutrients throughout the body. This medicine is used to treat iron deficiency anemia. This medicine may be used for other purposes; ask your health care provider or pharmacist if you have questions. COMMON BRAND NAME(S): Feraheme What should I tell my health care provider before I take this medicine? They need to know if you have any of these conditions:  anemia not caused by low iron levels  high levels of iron in the blood  magnetic resonance imaging (MRI) test scheduled  an unusual or allergic reaction to iron, other medicines, foods, dyes, or preservatives  pregnant or trying to get pregnant  breast-feeding How should I use this medicine? This medicine is for injection into a vein. It is given by a health care professional in a hospital or clinic setting. Talk to your pediatrician regarding the use of this medicine in children. Special care may be needed. Overdosage: If you think you have taken too much of this medicine contact a poison control center or emergency room at once. NOTE: This medicine is only for you. Do not share this medicine with others. What if I miss a dose? It is important not to miss your dose. Call your doctor or health care professional if you are unable to keep an appointment. What may interact with this medicine? This medicine may interact with the following medications:  other iron products This list may not describe all possible interactions. Give your health care provider a list of all the medicines, herbs, non-prescription drugs, or dietary supplements you use. Also tell them if you smoke, drink alcohol, or use illegal drugs. Some items may interact with your medicine. What should I watch for while using this medicine? Visit your doctor or healthcare professional regularly. Tell your doctor or healthcare  professional if your symptoms do not start to get better or if they get worse. You may need blood work done while you are taking this medicine. You may need to follow a special diet. Talk to your doctor. Foods that contain iron include: whole grains/cereals, dried fruits, beans, or peas, leafy green vegetables, and organ meats (liver, kidney). What side effects may I notice from receiving this medicine? Side effects that you should report to your doctor or health care professional as soon as possible:  allergic reactions like skin rash, itching or hives, swelling of the face, lips, or tongue  breathing problems  changes in blood pressure  feeling faint or lightheaded, falls  fever or chills  flushing, sweating, or hot feelings  swelling of the ankles or feet Side effects that usually do not require medical attention (report to your doctor or health care professional if they continue or are bothersome):  diarrhea  headache  nausea, vomiting  stomach pain This list may not describe all possible side effects. Call your doctor for medical advice about side effects. You may report side effects to FDA at 1-800-FDA-1088. Where should I keep my medicine? This drug is given in a hospital or clinic and will not be stored at home. NOTE: This sheet is a summary. It may not cover all possible information. If you have questions about this medicine, talk to your doctor, pharmacist, or health care provider.  2021 Elsevier/Gold Standard (2016-04-12 20:21:10)  

## 2020-06-16 ENCOUNTER — Telehealth: Payer: Self-pay | Admitting: Oncology

## 2020-06-16 NOTE — Telephone Encounter (Signed)
Rescheduled upcoming appointment per 4/11 schedule message. Patient's daughter is aware of changes.

## 2020-06-18 ENCOUNTER — Ambulatory Visit: Payer: Medicare Other

## 2020-06-20 ENCOUNTER — Inpatient Hospital Stay: Payer: Medicare Other

## 2020-06-20 ENCOUNTER — Other Ambulatory Visit: Payer: Self-pay

## 2020-06-20 VITALS — BP 129/49 | HR 95 | Temp 98.8°F | Resp 20

## 2020-06-20 DIAGNOSIS — Z79899 Other long term (current) drug therapy: Secondary | ICD-10-CM | POA: Diagnosis not present

## 2020-06-20 DIAGNOSIS — D631 Anemia in chronic kidney disease: Secondary | ICD-10-CM | POA: Diagnosis not present

## 2020-06-20 DIAGNOSIS — D509 Iron deficiency anemia, unspecified: Secondary | ICD-10-CM | POA: Diagnosis not present

## 2020-06-20 DIAGNOSIS — N189 Chronic kidney disease, unspecified: Secondary | ICD-10-CM | POA: Diagnosis not present

## 2020-06-20 DIAGNOSIS — I129 Hypertensive chronic kidney disease with stage 1 through stage 4 chronic kidney disease, or unspecified chronic kidney disease: Secondary | ICD-10-CM | POA: Diagnosis not present

## 2020-06-20 DIAGNOSIS — Z7982 Long term (current) use of aspirin: Secondary | ICD-10-CM | POA: Diagnosis not present

## 2020-06-20 DIAGNOSIS — D649 Anemia, unspecified: Secondary | ICD-10-CM

## 2020-06-20 MED ORDER — SODIUM CHLORIDE 0.9 % IV SOLN
510.0000 mg | Freq: Once | INTRAVENOUS | Status: AC
  Administered 2020-06-20: 510 mg via INTRAVENOUS
  Filled 2020-06-20: qty 510

## 2020-06-20 MED ORDER — SODIUM CHLORIDE 0.9 % IV SOLN
Freq: Once | INTRAVENOUS | Status: AC
  Filled 2020-06-20: qty 250

## 2020-06-20 NOTE — Patient Instructions (Signed)
Ferumoxytol injection What is this medicine? FERUMOXYTOL is an iron complex. Iron is used to make healthy red blood cells, which carry oxygen and nutrients throughout the body. This medicine is used to treat iron deficiency anemia. This medicine may be used for other purposes; ask your health care provider or pharmacist if you have questions. COMMON BRAND NAME(S): Feraheme What should I tell my health care provider before I take this medicine? They need to know if you have any of these conditions:  anemia not caused by low iron levels  high levels of iron in the blood  magnetic resonance imaging (MRI) test scheduled  an unusual or allergic reaction to iron, other medicines, foods, dyes, or preservatives  pregnant or trying to get pregnant  breast-feeding How should I use this medicine? This medicine is for injection into a vein. It is given by a health care professional in a hospital or clinic setting. Talk to your pediatrician regarding the use of this medicine in children. Special care may be needed. Overdosage: If you think you have taken too much of this medicine contact a poison control center or emergency room at once. NOTE: This medicine is only for you. Do not share this medicine with others. What if I miss a dose? It is important not to miss your dose. Call your doctor or health care professional if you are unable to keep an appointment. What may interact with this medicine? This medicine may interact with the following medications:  other iron products This list may not describe all possible interactions. Give your health care provider a list of all the medicines, herbs, non-prescription drugs, or dietary supplements you use. Also tell them if you smoke, drink alcohol, or use illegal drugs. Some items may interact with your medicine. What should I watch for while using this medicine? Visit your doctor or healthcare professional regularly. Tell your doctor or healthcare  professional if your symptoms do not start to get better or if they get worse. You may need blood work done while you are taking this medicine. You may need to follow a special diet. Talk to your doctor. Foods that contain iron include: whole grains/cereals, dried fruits, beans, or peas, leafy green vegetables, and organ meats (liver, kidney). What side effects may I notice from receiving this medicine? Side effects that you should report to your doctor or health care professional as soon as possible:  allergic reactions like skin rash, itching or hives, swelling of the face, lips, or tongue  breathing problems  changes in blood pressure  feeling faint or lightheaded, falls  fever or chills  flushing, sweating, or hot feelings  swelling of the ankles or feet Side effects that usually do not require medical attention (report to your doctor or health care professional if they continue or are bothersome):  diarrhea  headache  nausea, vomiting  stomach pain This list may not describe all possible side effects. Call your doctor for medical advice about side effects. You may report side effects to FDA at 1-800-FDA-1088. Where should I keep my medicine? This drug is given in a hospital or clinic and will not be stored at home. NOTE: This sheet is a summary. It may not cover all possible information. If you have questions about this medicine, talk to your doctor, pharmacist, or health care provider.  2021 Elsevier/Gold Standard (2016-04-12 20:21:10)  

## 2020-06-30 ENCOUNTER — Inpatient Hospital Stay: Payer: Medicare Other

## 2020-06-30 ENCOUNTER — Other Ambulatory Visit: Payer: Self-pay

## 2020-06-30 ENCOUNTER — Telehealth: Payer: Self-pay

## 2020-06-30 VITALS — BP 128/52 | HR 88 | Temp 98.2°F | Resp 18

## 2020-06-30 DIAGNOSIS — Z79899 Other long term (current) drug therapy: Secondary | ICD-10-CM | POA: Diagnosis not present

## 2020-06-30 DIAGNOSIS — D631 Anemia in chronic kidney disease: Secondary | ICD-10-CM

## 2020-06-30 DIAGNOSIS — Z7982 Long term (current) use of aspirin: Secondary | ICD-10-CM | POA: Diagnosis not present

## 2020-06-30 DIAGNOSIS — N189 Chronic kidney disease, unspecified: Secondary | ICD-10-CM | POA: Diagnosis not present

## 2020-06-30 DIAGNOSIS — D509 Iron deficiency anemia, unspecified: Secondary | ICD-10-CM

## 2020-06-30 DIAGNOSIS — D649 Anemia, unspecified: Secondary | ICD-10-CM

## 2020-06-30 DIAGNOSIS — I129 Hypertensive chronic kidney disease with stage 1 through stage 4 chronic kidney disease, or unspecified chronic kidney disease: Secondary | ICD-10-CM | POA: Diagnosis not present

## 2020-06-30 LAB — CBC WITH DIFFERENTIAL (CANCER CENTER ONLY)
Abs Immature Granulocytes: 0.02 10*3/uL (ref 0.00–0.07)
Basophils Absolute: 0.1 10*3/uL (ref 0.0–0.1)
Basophils Relative: 1 %
Eosinophils Absolute: 0.1 10*3/uL (ref 0.0–0.5)
Eosinophils Relative: 1 %
HCT: 27.1 % — ABNORMAL LOW (ref 36.0–46.0)
Hemoglobin: 8.3 g/dL — ABNORMAL LOW (ref 12.0–15.0)
Immature Granulocytes: 0 %
Lymphocytes Relative: 7 %
Lymphs Abs: 0.5 10*3/uL — ABNORMAL LOW (ref 0.7–4.0)
MCH: 29.1 pg (ref 26.0–34.0)
MCHC: 30.6 g/dL (ref 30.0–36.0)
MCV: 95.1 fL (ref 80.0–100.0)
Monocytes Absolute: 0.7 10*3/uL (ref 0.1–1.0)
Monocytes Relative: 9 %
Neutro Abs: 6.4 10*3/uL (ref 1.7–7.7)
Neutrophils Relative %: 82 %
Platelet Count: 317 10*3/uL (ref 150–400)
RBC: 2.85 MIL/uL — ABNORMAL LOW (ref 3.87–5.11)
RDW: 22.5 % — ABNORMAL HIGH (ref 11.5–15.5)
WBC Count: 7.9 10*3/uL (ref 4.0–10.5)
nRBC: 0 % (ref 0.0–0.2)

## 2020-06-30 LAB — IRON AND TIBC
Iron: 47 ug/dL (ref 41–142)
Saturation Ratios: 23 % (ref 21–57)
TIBC: 205 ug/dL — ABNORMAL LOW (ref 236–444)
UIBC: 159 ug/dL (ref 120–384)

## 2020-06-30 LAB — FERRITIN: Ferritin: 339 ng/mL — ABNORMAL HIGH (ref 11–307)

## 2020-06-30 LAB — SAMPLE TO BLOOD BANK

## 2020-06-30 MED ORDER — DARBEPOETIN ALFA 300 MCG/0.6ML IJ SOSY
300.0000 ug | PREFILLED_SYRINGE | Freq: Once | INTRAMUSCULAR | Status: AC
  Administered 2020-06-30: 300 ug via SUBCUTANEOUS

## 2020-06-30 MED ORDER — DARBEPOETIN ALFA 300 MCG/0.6ML IJ SOSY
PREFILLED_SYRINGE | INTRAMUSCULAR | Status: AC
  Filled 2020-06-30: qty 0.6

## 2020-06-30 NOTE — Patient Instructions (Signed)
Darbepoetin Alfa injection What is this medicine? DARBEPOETIN ALFA (dar be POE e tin AL fa) helps your body make more red blood cells. It is used to treat anemia caused by chronic kidney failure and chemotherapy. This medicine may be used for other purposes; ask your health care provider or pharmacist if you have questions. COMMON BRAND NAME(S): Aranesp What should I tell my health care provider before I take this medicine? They need to know if you have any of these conditions:  blood clotting disorders or history of blood clots  cancer patient not on chemotherapy  cystic fibrosis  heart disease, such as angina, heart failure, or a history of a heart attack  hemoglobin level of 12 g/dL or greater  high blood pressure  low levels of folate, iron, or vitamin B12  seizures  an unusual or allergic reaction to darbepoetin, erythropoietin, albumin, hamster proteins, latex, other medicines, foods, dyes, or preservatives  pregnant or trying to get pregnant  breast-feeding How should I use this medicine? This medicine is for injection into a vein or under the skin. It is usually given by a health care professional in a hospital or clinic setting. If you get this medicine at home, you will be taught how to prepare and give this medicine. Use exactly as directed. Take your medicine at regular intervals. Do not take your medicine more often than directed. It is important that you put your used needles and syringes in a special sharps container. Do not put them in a trash can. If you do not have a sharps container, call your pharmacist or healthcare provider to get one. A special MedGuide will be given to you by the pharmacist with each prescription and refill. Be sure to read this information carefully each time. Talk to your pediatrician regarding the use of this medicine in children. While this medicine may be used in children as young as 1 month of age for selected conditions, precautions do  apply. Overdosage: If you think you have taken too much of this medicine contact a poison control center or emergency room at once. NOTE: This medicine is only for you. Do not share this medicine with others. What if I miss a dose? If you miss a dose, take it as soon as you can. If it is almost time for your next dose, take only that dose. Do not take double or extra doses. What may interact with this medicine? Do not take this medicine with any of the following medications:  epoetin alfa This list may not describe all possible interactions. Give your health care provider a list of all the medicines, herbs, non-prescription drugs, or dietary supplements you use. Also tell them if you smoke, drink alcohol, or use illegal drugs. Some items may interact with your medicine. What should I watch for while using this medicine? Your condition will be monitored carefully while you are receiving this medicine. You may need blood work done while you are taking this medicine. This medicine may cause a decrease in vitamin B6. You should make sure that you get enough vitamin B6 while you are taking this medicine. Discuss the foods you eat and the vitamins you take with your health care professional. What side effects may I notice from receiving this medicine? Side effects that you should report to your doctor or health care professional as soon as possible:  allergic reactions like skin rash, itching or hives, swelling of the face, lips, or tongue  breathing problems  changes in   vision  chest pain  confusion, trouble speaking or understanding  feeling faint or lightheaded, falls  high blood pressure  muscle aches or pains  pain, swelling, warmth in the leg  rapid weight gain  severe headaches  sudden numbness or weakness of the face, arm or leg  trouble walking, dizziness, loss of balance or coordination  seizures (convulsions)  swelling of the ankles, feet, hands  unusually weak or  tired Side effects that usually do not require medical attention (report to your doctor or health care professional if they continue or are bothersome):  diarrhea  fever, chills (flu-like symptoms)  headaches  nausea, vomiting  redness, stinging, or swelling at site where injected This list may not describe all possible side effects. Call your doctor for medical advice about side effects. You may report side effects to FDA at 1-800-FDA-1088. Where should I keep my medicine? Keep out of the reach of children. Store in a refrigerator between 2 and 8 degrees C (36 and 46 degrees F). Do not freeze. Do not shake. Throw away any unused portion if using a single-dose vial. Throw away any unused medicine after the expiration date. NOTE: This sheet is a summary. It may not cover all possible information. If you have questions about this medicine, talk to your doctor, pharmacist, or health care provider.  2021 Elsevier/Gold Standard (2017-03-09 16:44:20)  

## 2020-06-30 NOTE — Telephone Encounter (Signed)
Lisa [name], [name] daughter to let her know that she will not need blood tomorrow due to Hgb 8.3 and stable vital signs.  She also received Aranesp today.  Dr. Clelia Croft is out of office and Dr. Arbutus Ped was asked about her treatment being cancelled and he agreed that it was OK to cancel her blood transfusion.  Went over appts with Britta Mccreedy and will send a scheduling message to get [name] in with Dr. Clelia Croft on 6/7 or 6/8 in case she may need blood.  She has iron scheduled on 6/9 and 6/16 and I made [facility] aware of those appts too. Lorayne Marek, RN

## 2020-07-01 ENCOUNTER — Inpatient Hospital Stay: Payer: Medicare Other

## 2020-07-01 ENCOUNTER — Telehealth: Payer: Self-pay | Admitting: Oncology

## 2020-07-01 NOTE — Telephone Encounter (Signed)
Called patient's daughter regarding June appointments, patient will be notified of upcoming appointments.

## 2020-07-02 DIAGNOSIS — I10 Essential (primary) hypertension: Secondary | ICD-10-CM | POA: Diagnosis not present

## 2020-07-02 DIAGNOSIS — R600 Localized edema: Secondary | ICD-10-CM | POA: Diagnosis not present

## 2020-07-02 DIAGNOSIS — I499 Cardiac arrhythmia, unspecified: Secondary | ICD-10-CM | POA: Diagnosis not present

## 2020-07-18 DIAGNOSIS — R600 Localized edema: Secondary | ICD-10-CM | POA: Diagnosis not present

## 2020-07-18 DIAGNOSIS — K0889 Other specified disorders of teeth and supporting structures: Secondary | ICD-10-CM | POA: Diagnosis not present

## 2020-07-18 DIAGNOSIS — R634 Abnormal weight loss: Secondary | ICD-10-CM | POA: Diagnosis not present

## 2020-07-18 DIAGNOSIS — C44311 Basal cell carcinoma of skin of nose: Secondary | ICD-10-CM | POA: Diagnosis not present

## 2020-07-21 ENCOUNTER — Telehealth: Payer: Self-pay | Admitting: Family Medicine

## 2020-07-21 NOTE — Telephone Encounter (Addendum)
Spoke to daughter relayed that mirapex TID per Amy was okay it does have side effects of sedation and dizziness.  Could make Gait, imbalance- safety issue regarding falls.  She went ahead and gave 1 tab at 1300 and then another 1 tablet at 7p and if she has breakthrough she may give her another 1/2tab  3 to 4 hours later.  Needs new prescription.  Will see about giving TID ( how it works better to be given TID am pm regularly).

## 2020-07-21 NOTE — Telephone Encounter (Signed)
Pt daughter asking if the pramipexole (MIRAPEX) 0.25 MG tablet can be increased due to pt's RLS worsening in the evening, please call

## 2020-07-21 NOTE — Telephone Encounter (Signed)
Spoke to daughter.  Last seen 2 mon ago.  Now living with daughter at her home.  Stays in recliner most of the time.  Takes mirapex 0.25mg  po bid.  Last Friday and this Sunday, she has noted that 2 tabs are not sorking for her.  She has since living with her started giving to her 0.25mg  at noon and bedtime to see if would last some for her but the last Friday and Sunday nights pt has had achiness, uncomfortable, jerking and is requesting some more relief for her.  Increase dose?  Please advise.

## 2020-07-21 NOTE — Telephone Encounter (Signed)
We have discussed increased dosing several times and Lisa [name] is always worried about dizziness. I would not be opposed to trying mirapex 0.25mg  TID but they have to understand that this medication can increase risk of sedation, dizziness, imbalance and falls. I am really concerned that leg pain could be caused by anemia. Are the infusions helping?

## 2020-07-22 ENCOUNTER — Inpatient Hospital Stay: Payer: Medicare Other | Attending: Oncology

## 2020-07-22 ENCOUNTER — Inpatient Hospital Stay: Payer: Medicare Other

## 2020-07-22 ENCOUNTER — Other Ambulatory Visit: Payer: Self-pay

## 2020-07-22 ENCOUNTER — Telehealth: Payer: Self-pay | Admitting: *Deleted

## 2020-07-22 VITALS — BP 123/61 | HR 113 | Temp 98.9°F | Wt 111.9 lb

## 2020-07-22 DIAGNOSIS — N189 Chronic kidney disease, unspecified: Secondary | ICD-10-CM

## 2020-07-22 DIAGNOSIS — D509 Iron deficiency anemia, unspecified: Secondary | ICD-10-CM

## 2020-07-22 DIAGNOSIS — Z79899 Other long term (current) drug therapy: Secondary | ICD-10-CM | POA: Insufficient documentation

## 2020-07-22 DIAGNOSIS — D631 Anemia in chronic kidney disease: Secondary | ICD-10-CM

## 2020-07-22 DIAGNOSIS — I129 Hypertensive chronic kidney disease with stage 1 through stage 4 chronic kidney disease, or unspecified chronic kidney disease: Secondary | ICD-10-CM | POA: Insufficient documentation

## 2020-07-22 DIAGNOSIS — D649 Anemia, unspecified: Secondary | ICD-10-CM

## 2020-07-22 LAB — IRON AND TIBC
Iron: 31 ug/dL — ABNORMAL LOW (ref 41–142)
Saturation Ratios: 14 % — ABNORMAL LOW (ref 21–57)
TIBC: 220 ug/dL — ABNORMAL LOW (ref 236–444)
UIBC: 190 ug/dL (ref 120–384)

## 2020-07-22 LAB — CBC WITH DIFFERENTIAL (CANCER CENTER ONLY)
Abs Immature Granulocytes: 0.05 10*3/uL (ref 0.00–0.07)
Basophils Absolute: 0.1 10*3/uL (ref 0.0–0.1)
Basophils Relative: 1 %
Eosinophils Absolute: 0.1 10*3/uL (ref 0.0–0.5)
Eosinophils Relative: 1 %
HCT: 30.3 % — ABNORMAL LOW (ref 36.0–46.0)
Hemoglobin: 9.2 g/dL — ABNORMAL LOW (ref 12.0–15.0)
Immature Granulocytes: 1 %
Lymphocytes Relative: 5 %
Lymphs Abs: 0.4 10*3/uL — ABNORMAL LOW (ref 0.7–4.0)
MCH: 28.8 pg (ref 26.0–34.0)
MCHC: 30.4 g/dL (ref 30.0–36.0)
MCV: 94.7 fL (ref 80.0–100.0)
Monocytes Absolute: 1 10*3/uL (ref 0.1–1.0)
Monocytes Relative: 10 %
Neutro Abs: 7.6 10*3/uL (ref 1.7–7.7)
Neutrophils Relative %: 82 %
Platelet Count: 294 10*3/uL (ref 150–400)
RBC: 3.2 MIL/uL — ABNORMAL LOW (ref 3.87–5.11)
RDW: 19 % — ABNORMAL HIGH (ref 11.5–15.5)
WBC Count: 9.2 10*3/uL (ref 4.0–10.5)
nRBC: 0 % (ref 0.0–0.2)

## 2020-07-22 LAB — SAMPLE TO BLOOD BANK

## 2020-07-22 LAB — FERRITIN: Ferritin: 56 ng/mL (ref 11–307)

## 2020-07-22 MED ORDER — DARBEPOETIN ALFA 300 MCG/0.6ML IJ SOSY
PREFILLED_SYRINGE | INTRAMUSCULAR | Status: AC
  Filled 2020-07-22: qty 0.6

## 2020-07-22 MED ORDER — DARBEPOETIN ALFA 300 MCG/0.6ML IJ SOSY
300.0000 ug | PREFILLED_SYRINGE | Freq: Once | INTRAMUSCULAR | Status: AC
  Administered 2020-07-22: 300 ug via SUBCUTANEOUS

## 2020-07-22 NOTE — Telephone Encounter (Signed)
Patient's daughter Lisa [name] contacted office and shared the following: Patient will be moving in late June with Ms. Forsyth to area near [employer], [place], [place].  Patient has labs and receives Aranesp every 3 weeks, with MD appt every 9 weeks - next appts on 08/11/20 for Lab/MD/Injection.  Appts for Iron infusion 08/14/20 and 08/21/20.  Ms. Berton Lan would like a referral to an oncologist in the new area for her mother. She would like the current treatment schedule to be considered when making the referral so that her mother does not miss any care.  Advised her that this information will be given to Dr. Clelia Croft. She verbalized understanding.

## 2020-07-23 ENCOUNTER — Telehealth: Payer: Self-pay | Admitting: *Deleted

## 2020-07-23 MED ORDER — PRAMIPEXOLE DIHYDROCHLORIDE 0.25 MG PO TABS: ORAL_TABLET | ORAL | 3 refills | Status: AC

## 2020-07-23 NOTE — Telephone Encounter (Signed)
Please make a referral to [facility] [address]. [place]. [phone].  Diagnosis is chronic anemia.  This can go to any provider. You can fax any records needed.

## 2020-07-23 NOTE — Telephone Encounter (Signed)
PC to patient's daughter, Britta Mccreedy, informed her Dr. Clelia Croft is referring patient to [facility] in [place].  Also informed [place] that release of information form needs to be completed for patient's information to be sent.  She will print form from [facility] website & bring it to [facility] tomorrow.

## 2020-07-23 NOTE — Addendum Note (Signed)
Addended by: Shawnie Dapper L on: 07/23/2020 08:58 AM   Modules accepted: Orders

## 2020-07-24 DIAGNOSIS — R1031 Right lower quadrant pain: Secondary | ICD-10-CM | POA: Diagnosis not present

## 2020-07-24 DIAGNOSIS — N3001 Acute cystitis with hematuria: Secondary | ICD-10-CM | POA: Diagnosis not present

## 2020-07-25 ENCOUNTER — Telehealth: Payer: Self-pay | Admitting: *Deleted

## 2020-07-25 NOTE — Telephone Encounter (Signed)
Medical Release of Information signed by patient, delivered to office, forwarded to HIM. Patient was referred to [facility] by Dr. Clelia Croft.

## 2020-07-30 ENCOUNTER — Telehealth: Payer: Self-pay | Admitting: *Deleted

## 2020-07-30 NOTE — Telephone Encounter (Signed)
Returned PC to patient's daughter, Britta Mccreedy, informed her Dr. Clelia Croft states the patient's leg pain is not related to her iron deficiency.  Katina Dung to F/U with patient's neurologist & PCP, also informed her to call this office with any questions/concerns.  She verbalizes understanding.

## 2020-07-30 NOTE — Telephone Encounter (Signed)
-----   Message from Benjiman Core, MD sent at 07/30/2020 11:42 AM EDT ----- Regarding: RE: Leg pain This pain is unrelated to iron or iron deficiency.  She will receive iron supplements intravenously if her iron level is low not because of her pain.  Thanks ----- Message ----- From: Arville Care, RN Sent: 07/30/2020  11:38 AM EDT To: Benjiman Core, MD Subject: Leg pain                                       This patient's daughter called, the patient has hx of restless leg syndrome and is on mirapex from her neurologist for this.  However, her daughter says she is waking up during the night screaming with leg pain. She has increased the mirapex dose after consulting the neurologist but this pain is still occurring.  She said she researched this on-line and found that it can be related to iron deficiency.  She is asking if you think this could be the problem & should her mother come in for an iron infusion before scheduled in June.  If not, do you think an oral iron supplement would help?  Please advise, I will call her back. Thanks, Darel Hong

## 2020-08-01 DIAGNOSIS — R634 Abnormal weight loss: Secondary | ICD-10-CM | POA: Diagnosis not present

## 2020-08-01 DIAGNOSIS — K0889 Other specified disorders of teeth and supporting structures: Secondary | ICD-10-CM | POA: Diagnosis not present

## 2020-08-01 DIAGNOSIS — R600 Localized edema: Secondary | ICD-10-CM | POA: Diagnosis not present

## 2020-08-05 ENCOUNTER — Telehealth: Payer: Self-pay | Admitting: Neurology

## 2020-08-05 DIAGNOSIS — G2581 Restless legs syndrome: Secondary | ICD-10-CM

## 2020-08-05 NOTE — Telephone Encounter (Signed)
Patient's daughter called in this morning, they are moving to [facility] and asking for a referral to a neurologist there. I have already sent her a records release form to get that process started, and wanted to see if there is a specific place you recommend for her. Thank you!

## 2020-08-08 DIAGNOSIS — I739 Peripheral vascular disease, unspecified: Secondary | ICD-10-CM | POA: Diagnosis not present

## 2020-08-08 DIAGNOSIS — J019 Acute sinusitis, unspecified: Secondary | ICD-10-CM | POA: Diagnosis not present

## 2020-08-08 DIAGNOSIS — L603 Nail dystrophy: Secondary | ICD-10-CM | POA: Diagnosis not present

## 2020-08-12 ENCOUNTER — Other Ambulatory Visit: Payer: Medicare Other

## 2020-08-12 ENCOUNTER — Ambulatory Visit: Payer: Medicare Other

## 2020-08-12 ENCOUNTER — Ambulatory Visit: Payer: Medicare Other | Admitting: Oncology

## 2020-08-12 NOTE — Telephone Encounter (Signed)
I called patient to discuss referral to [facility].  There is a [facility] clinic in [place].  I was wondering if this would be an acceptable place for the patient.  No answer, left a voicemail asking patient to call me back.

## 2020-08-12 NOTE — Telephone Encounter (Signed)
Patient's daughter, Lisa [name], per [facility], returned my call.  She is unsure which neurology group in [place], [place] she would like patient to see.  She will research this tonight and call us back in the morning.  She would prefer not to leave a message with this information but rather speak to a staff member about this tomorrow.  I advised her that Ladona Ridgel or Joni Reining could help her tomorrow or Thursday if she calls back those days.  Patient's daughter verbalized understanding.

## 2020-08-13 ENCOUNTER — Inpatient Hospital Stay: Payer: Medicare Other

## 2020-08-13 ENCOUNTER — Other Ambulatory Visit: Payer: Self-pay

## 2020-08-13 ENCOUNTER — Inpatient Hospital Stay: Payer: Medicare Other | Attending: Oncology

## 2020-08-13 ENCOUNTER — Ambulatory Visit: Payer: Medicare Other | Admitting: Oncology

## 2020-08-13 ENCOUNTER — Inpatient Hospital Stay (HOSPITAL_BASED_OUTPATIENT_CLINIC_OR_DEPARTMENT_OTHER): Payer: Medicare Other | Admitting: Oncology

## 2020-08-13 ENCOUNTER — Telehealth: Payer: Self-pay | Admitting: Family Medicine

## 2020-08-13 VITALS — BP 96/46 | HR 86 | Temp 97.4°F | Resp 18 | Wt 104.0 lb

## 2020-08-13 DIAGNOSIS — D509 Iron deficiency anemia, unspecified: Secondary | ICD-10-CM | POA: Diagnosis not present

## 2020-08-13 DIAGNOSIS — N189 Chronic kidney disease, unspecified: Secondary | ICD-10-CM

## 2020-08-13 DIAGNOSIS — Z79899 Other long term (current) drug therapy: Secondary | ICD-10-CM | POA: Insufficient documentation

## 2020-08-13 DIAGNOSIS — R5383 Other fatigue: Secondary | ICD-10-CM | POA: Insufficient documentation

## 2020-08-13 DIAGNOSIS — I129 Hypertensive chronic kidney disease with stage 1 through stage 4 chronic kidney disease, or unspecified chronic kidney disease: Secondary | ICD-10-CM | POA: Insufficient documentation

## 2020-08-13 DIAGNOSIS — D649 Anemia, unspecified: Secondary | ICD-10-CM

## 2020-08-13 DIAGNOSIS — D631 Anemia in chronic kidney disease: Secondary | ICD-10-CM

## 2020-08-13 LAB — CBC WITH DIFFERENTIAL (CANCER CENTER ONLY)
Abs Immature Granulocytes: 0.05 K/uL (ref 0.00–0.07)
Basophils Absolute: 0 K/uL (ref 0.0–0.1)
Basophils Relative: 0 %
Eosinophils Absolute: 0 K/uL (ref 0.0–0.5)
Eosinophils Relative: 0 %
HCT: 22.7 % — ABNORMAL LOW (ref 36.0–46.0)
Hemoglobin: 7.3 g/dL — ABNORMAL LOW (ref 12.0–15.0)
Immature Granulocytes: 1 %
Lymphocytes Relative: 3 %
Lymphs Abs: 0.3 K/uL — ABNORMAL LOW (ref 0.7–4.0)
MCH: 28 pg (ref 26.0–34.0)
MCHC: 32.2 g/dL (ref 30.0–36.0)
MCV: 87 fL (ref 80.0–100.0)
Monocytes Absolute: 0.9 K/uL (ref 0.1–1.0)
Monocytes Relative: 8 %
Neutro Abs: 9.7 K/uL — ABNORMAL HIGH (ref 1.7–7.7)
Neutrophils Relative %: 88 %
Platelet Count: 262 K/uL (ref 150–400)
RBC: 2.61 MIL/uL — ABNORMAL LOW (ref 3.87–5.11)
RDW: 17.9 % — ABNORMAL HIGH (ref 11.5–15.5)
WBC Count: 11 K/uL — ABNORMAL HIGH (ref 4.0–10.5)
nRBC: 0 % (ref 0.0–0.2)

## 2020-08-13 LAB — SAMPLE TO BLOOD BANK

## 2020-08-13 LAB — IRON AND TIBC
Iron: 15 ug/dL — ABNORMAL LOW (ref 41–142)
Saturation Ratios: 6 % — ABNORMAL LOW (ref 21–57)
TIBC: 234 ug/dL — ABNORMAL LOW (ref 236–444)
UIBC: 219 ug/dL (ref 120–384)

## 2020-08-13 LAB — FERRITIN: Ferritin: 29 ng/mL (ref 11–307)

## 2020-08-13 MED ORDER — DARBEPOETIN ALFA 300 MCG/0.6ML IJ SOSY
PREFILLED_SYRINGE | INTRAMUSCULAR | Status: AC
  Filled 2020-08-13: qty 0.6

## 2020-08-13 MED ORDER — DARBEPOETIN ALFA 300 MCG/0.6ML IJ SOSY
300.0000 ug | PREFILLED_SYRINGE | Freq: Once | INTRAMUSCULAR | Status: AC
  Administered 2020-08-13: 300 ug via SUBCUTANEOUS

## 2020-08-13 NOTE — Telephone Encounter (Signed)
Pt's daughter(on DPR) is asking for a call to discuss pt's medications pramipexole (MIRAPEX) 0.25 MG tablet & primidone (MYSOLINE) 50 MG tablet

## 2020-08-13 NOTE — Progress Notes (Signed)
Hematology and Oncology Follow Up Visit  Lisa [name] [id number] Jul 09, 1923 87 y.o. 08/13/2020 10:38 AM Lisa [name], MDMcNeill, Toniann Fail, MD   Principle Diagnosis: 87 year old woman with multifactorial anemia related to iron deficiency, chronic disease and poor iron absorption diagnosed in 2018.     Prior Therapy: IV iron infusion infusion as well as packed red cell transfusion.  Current therapy:   Aranesp 300 mcg every 3 weeks to keep her hemoglobin above 10.  Feraheme at 1000 mg every 6 to 9 weeks.  Packed red cell transfusion for hemoglobin above 8.  Interim History: Lisa [name] returns today for a follow-up visit.  Since the last visit, she reports no major changes in her health.  Her daughter has reported some fatigue and some tiredness and overall slight functional decline.  She is currently residing with her daughter full-time.  They are planning to move to [facility] in the near future.  She denies any hematochezia, melena or hemoptysis.  She does report loose bowel habits at times.                Medications: Updated on review. Current Outpatient Medications  Medication Sig Dispense Refill  . acetaminophen (TYLENOL) 500 MG tablet Take 500-1,000 mg by mouth at bedtime.    Marland Kitchen aspirin EC 81 MG tablet Take 81 mg by mouth daily.    . furosemide (LASIX) 20 MG tablet Take 10 mg by mouth daily.    . Iron-Vitamin C 100-250 MG TABS Take by mouth as needed.    . mupirocin ointment (BACTROBAN) 2 % Apply topically 2 (two) times daily.    Marland Kitchen OVER THE COUNTER MEDICATION Apply topically as needed. Cream for arthritic pain. Patient unable to recall the name at this time.    . pramipexole (MIRAPEX) 0.25 MG tablet Take 1 tablet twice daily. May take additional tablet at bedtime as needed. 270 tablet 3  . primidone (MYSOLINE) 50 MG tablet Take 3 tablets (150 mg total) by mouth at bedtime. 270 tablet 3  . raloxifene (EVISTA) 60 MG tablet Take 60 mg by mouth at bedtime.     . timolol  (TIMOPTIC) 0.5 % ophthalmic solution Place 1 drop into both eyes daily.   1  . UNABLE TO FIND Med Name: Aranesp infusions every 3 weeks administered by [facility]    . VYTORIN 10-20 MG tablet Take 1 tablet by mouth daily.  0   No current facility-administered medications for this visit.     Allergies:  Allergies  Allergen Reactions  . Mycostatin [Nystatin] Swelling and Rash      Physical Exam:   Blood pressure (!) 96/46, pulse 86, temperature (!) 97.4 F (36.3 C), resp. rate 18, weight 104 lb (47.2 kg), SpO2 98 %.    ECOG: 2    General appearance: Comfortable appearing without any discomfort Head: Normocephalic without any trauma Oropharynx: Mucous membranes are moist and pink without any thrush or ulcers. Eyes: Pupils are equal and round reactive to light. Lymph nodes: No cervical, supraclavicular, inguinal or axillary lymphadenopathy.   Heart:regular rate and rhythm.  S1 and S2 without leg edema. Lung: Clear without any rhonchi or wheezes.  No dullness to percussion. Abdomin: Soft, nontender, nondistended with good bowel sounds.  No hepatosplenomegaly. Musculoskeletal: No joint deformity or effusion.  Full range of motion noted. Neurological: No deficits noted on motor, sensory and deep tendon reflex exam. Skin: No petechial rash or dryness.  Appeared moist.     .  Lab Results: Lab Results  Component Value Date   WBC 9.2 07/22/2020   HGB 9.2 (L) 07/22/2020   HCT 30.3 (L) 07/22/2020   MCV 94.7 07/22/2020   PLT 294 07/22/2020     Chemistry      Component Value Date/Time   NA 140 05/10/2019 1437   NA 135 (A) 04/01/2016 0000   K 4.7 05/10/2019 1437   CL 104 05/10/2019 1437   CO2 29 05/10/2019 1437   BUN 21 05/10/2019 1437   BUN 19 04/01/2016 0000   CREATININE 0.84 05/10/2019 1437   GLU 102 04/01/2016 0000      Component Value Date/Time   CALCIUM 8.6 (L) 05/10/2019 1437   ALKPHOS 75 05/10/2019 1437   AST 18 05/10/2019  1437   ALT 16 05/10/2019 1437   BILITOT <0.2 (L) 05/10/2019 1437       Impression and Plan:  87 year old woman with:   1.  Multifactorial anemia diagnosed in 2018.  She has iron deficiency related to poor iron absorption in addition to chronic disease.  Her disease status was updated at this time and treatment options were reviewed.  She continues to receive supportive management with Aranesp every 3 weeks, IV iron infusion every 6 weeks and transfusion as needed.  Laboratory data on Jul 22, 2020 showed a hemoglobin of 9.2 with iron level of 31 and saturation of 14%.  CBC reviewed today and showed a hemoglobin of 7.2 and will receive Aranesp today and IV iron on 08/14/2020.    2.  Iron deficiency: Iron studies obtained on Jul 22, 2020 showed declining iron levels despite receiving Feraheme in April 2022.  Risks and benefits of proceeding with Feraheme versus ferric gluconate were discussed at this time.  Preferred to proceed with Feraheme at this time.    3.  Follow-up: Every 3 weeks for repeat laboratory testing, Aranesp and IV iron as needed.  This will not be scheduled for the time being given her pending move to [place].   30 minutes were dedicated to this encounter.  Time was spent on reviewing laboratory data, disease status update and outlining future plan of care.     Eli Hose, MD 6/8/202210:38 AM

## 2020-08-14 ENCOUNTER — Inpatient Hospital Stay: Payer: Medicare Other

## 2020-08-14 ENCOUNTER — Other Ambulatory Visit: Payer: Self-pay | Admitting: Oncology

## 2020-08-14 ENCOUNTER — Encounter: Payer: Self-pay | Admitting: *Deleted

## 2020-08-14 VITALS — BP 108/50 | HR 95 | Temp 98.8°F | Resp 16 | Wt 109.0 lb

## 2020-08-14 DIAGNOSIS — I129 Hypertensive chronic kidney disease with stage 1 through stage 4 chronic kidney disease, or unspecified chronic kidney disease: Secondary | ICD-10-CM | POA: Diagnosis not present

## 2020-08-14 DIAGNOSIS — D649 Anemia, unspecified: Secondary | ICD-10-CM

## 2020-08-14 DIAGNOSIS — D631 Anemia in chronic kidney disease: Secondary | ICD-10-CM

## 2020-08-14 DIAGNOSIS — D509 Iron deficiency anemia, unspecified: Secondary | ICD-10-CM

## 2020-08-14 MED ORDER — SODIUM CHLORIDE 0.9 % IV SOLN
Freq: Once | INTRAVENOUS | Status: AC
  Filled 2020-08-14: qty 250

## 2020-08-14 MED ORDER — SODIUM CHLORIDE 0.9 % IV SOLN
510.0000 mg | Freq: Once | INTRAVENOUS | Status: AC
  Administered 2020-08-14: 510 mg via INTRAVENOUS
  Filled 2020-08-14: qty 510

## 2020-08-14 NOTE — Patient Instructions (Signed)
Ferumoxytol injection What is this medicine? FERUMOXYTOL is an iron complex. Iron is used to make healthy red blood cells, which carry oxygen and nutrients throughout the body. This medicine is used to treat iron deficiency anemia. This medicine may be used for other purposes; ask your health care provider or pharmacist if you have questions. COMMON BRAND NAME(S): Feraheme What should I tell my health care provider before I take this medicine? They need to know if you have any of these conditions:  anemia not caused by low iron levels  high levels of iron in the blood  magnetic resonance imaging (MRI) test scheduled  an unusual or allergic reaction to iron, other medicines, foods, dyes, or preservatives  pregnant or trying to get pregnant  breast-feeding How should I use this medicine? This medicine is for injection into a vein. It is given by a health care professional in a hospital or clinic setting. Talk to your pediatrician regarding the use of this medicine in children. Special care may be needed. Overdosage: If you think you have taken too much of this medicine contact a poison control center or emergency room at once. NOTE: This medicine is only for you. Do not share this medicine with others. What if I miss a dose? It is important not to miss your dose. Call your doctor or health care professional if you are unable to keep an appointment. What may interact with this medicine? This medicine may interact with the following medications:  other iron products This list may not describe all possible interactions. Give your health care provider a list of all the medicines, herbs, non-prescription drugs, or dietary supplements you use. Also tell them if you smoke, drink alcohol, or use illegal drugs. Some items may interact with your medicine. What should I watch for while using this medicine? Visit your doctor or healthcare professional regularly. Tell your doctor or healthcare  professional if your symptoms do not start to get better or if they get worse. You may need blood work done while you are taking this medicine. You may need to follow a special diet. Talk to your doctor. Foods that contain iron include: whole grains/cereals, dried fruits, beans, or peas, leafy green vegetables, and organ meats (liver, kidney). What side effects may I notice from receiving this medicine? Side effects that you should report to your doctor or health care professional as soon as possible:  allergic reactions like skin rash, itching or hives, swelling of the face, lips, or tongue  breathing problems  changes in blood pressure  feeling faint or lightheaded, falls  fever or chills  flushing, sweating, or hot feelings  swelling of the ankles or feet Side effects that usually do not require medical attention (report to your doctor or health care professional if they continue or are bothersome):  diarrhea  headache  nausea, vomiting  stomach pain This list may not describe all possible side effects. Call your doctor for medical advice about side effects. You may report side effects to FDA at 1-800-FDA-1088. Where should I keep my medicine? This drug is given in a hospital or clinic and will not be stored at home. NOTE: This sheet is a summary. It may not cover all possible information. If you have questions about this medicine, talk to your doctor, pharmacist, or health care provider.  2021 Elsevier/Gold Standard (2016-04-12 20:21:10)  

## 2020-08-14 NOTE — Telephone Encounter (Signed)
I called daughter the patient.  She had a slip from her potty chair down to the floor yesterday morning about 5.  She could not get her up had to call paramedics to help assist to get her up off the floor. She is giving her 3 tablets of the Mirapex for restless leg which is effective for that but it makes her really groggy.  She is asking if the primidone that the patient is taking 3 tablets daily for the tremor,would be also a contributing factor of drowsiness and if so since it is not really helping th tremors that she has could be tapered down. (If this would help the gorgginess/sleepiness).  I thought it would be good to have appt (even mychartVV to address).   She had tried giving 2.5 tabs but not effective.  Does have iron infusion periodically (this I thought was scheduled tomorrow). Has not moved to [place] yet.

## 2020-08-18 ENCOUNTER — Telehealth: Payer: Self-pay | Admitting: Family Medicine

## 2020-08-18 NOTE — Progress Notes (Deleted)
   PATIENT: Lisa [name] DOB: September 01, 1923  REASON FOR VISIT: follow up HISTORY FROM: patient  Virtual Visit via Telephone Note  I connected with Lisa [name] on 08/18/20 at  9:00 AM EDT by telephone and verified that I am speaking with the correct person using two identifiers.   I discussed the limitations, risks, security and privacy concerns of performing an evaluation and management service by telephone and the availability of in person appointments. I also discussed with the patient that there may be a patient responsible charge related to this service. The patient expressed understanding and agreed to proceed.   History of Present Illness:  08/18/2020 ALL: Ezell returns for follow up for tremor. Her daughter called to report a fall 08/13/20. She slipped off of her potty chair at home. She could not get her mother up and had to call paramedics. Fortunately, no injuries.    06/05/2020 ALL:  She returns for follow up for tremor and RLS. She continues primidone 150mg  daily and pramipexole 0.25mg  BID. She does feel that increased pramipexole dose to twice dialy as helped RLS. She is sleeping well. Tremor has worsened. She is noticing more head and voice tremor. Both hands tremor almost all the time. Worse with eating. She would like to work with OT for adaptive treatment options. She is very hesitant to increase medications for fears of dizziness/falls. She denies changes with gait. Rare dizziness. No falls. She is using Rolator. She plans to move back in with her daughter versus assisted living with [facility]. She is followed closely by hematology for anemia. She has had multiple transfusions this month for Hgb of 6.    Observations/Objective:  Generalized: Well developed, in no acute distress  Mentation: Alert oriented to time, place, history taking. Follows all commands speech and language fluent   Assessment and Plan:  87 y.o. year old female  has a past medical history of  Anemia, Arthritis, Carotid artery occlusion, Cataract, Glaucoma, High cholesterol, Hypertension, Osteoporosis, Pneumonia (July 03, 2014), Syncope (03/15/2016), and UTI (urinary tract infection). here with    ICD-10-CM   1. Essential tremor  G25.0     2. Restless legs  G25.81       No orders of the defined types were placed in this encounter.   No orders of the defined types were placed in this encounter.    Follow Up Instructions:  I discussed the assessment and treatment plan with the patient. The patient was provided an opportunity to ask questions and all were answered. The patient agreed with the plan and demonstrated an understanding of the instructions.   The patient was advised to call back or seek an in-person evaluation if the symptoms worsen or if the condition fails to improve as anticipated.  I provided *** minutes of non-face-to-face time during this encounter. Patient located at their place of residence during Mychart visit. Provider is in the office.    05/13/2016, NP

## 2020-08-18 NOTE — Telephone Encounter (Signed)
Pt's daughter, Lisa [name] cancelled appt for today due to forgetting appt.

## 2020-08-21 ENCOUNTER — Inpatient Hospital Stay: Payer: Medicare Other

## 2020-08-21 ENCOUNTER — Other Ambulatory Visit: Payer: Self-pay

## 2020-08-21 VITALS — BP 118/54 | HR 92 | Temp 98.2°F | Resp 18

## 2020-08-21 DIAGNOSIS — N189 Chronic kidney disease, unspecified: Secondary | ICD-10-CM

## 2020-08-21 DIAGNOSIS — D649 Anemia, unspecified: Secondary | ICD-10-CM

## 2020-08-21 DIAGNOSIS — D509 Iron deficiency anemia, unspecified: Secondary | ICD-10-CM

## 2020-08-21 DIAGNOSIS — I129 Hypertensive chronic kidney disease with stage 1 through stage 4 chronic kidney disease, or unspecified chronic kidney disease: Secondary | ICD-10-CM | POA: Diagnosis not present

## 2020-08-21 DIAGNOSIS — D631 Anemia in chronic kidney disease: Secondary | ICD-10-CM

## 2020-08-21 MED ORDER — SODIUM CHLORIDE 0.9 % IV SOLN
Freq: Once | INTRAVENOUS | Status: AC
  Filled 2020-08-21: qty 250

## 2020-08-21 MED ORDER — SODIUM CHLORIDE 0.9 % IV SOLN
510.0000 mg | Freq: Once | INTRAVENOUS | Status: AC
  Administered 2020-08-21: 510 mg via INTRAVENOUS
  Filled 2020-08-21: qty 510

## 2020-08-21 NOTE — Patient Instructions (Signed)
Ferumoxytol injection What is this medicine? FERUMOXYTOL is an iron complex. Iron is used to make healthy red blood cells, which carry oxygen and nutrients throughout the body. This medicine is used to treat iron deficiency anemia. This medicine may be used for other purposes; ask your health care provider or pharmacist if you have questions. COMMON BRAND NAME(S): Feraheme What should I tell my health care provider before I take this medicine? They need to know if you have any of these conditions:  anemia not caused by low iron levels  high levels of iron in the blood  magnetic resonance imaging (MRI) test scheduled  an unusual or allergic reaction to iron, other medicines, foods, dyes, or preservatives  pregnant or trying to get pregnant  breast-feeding How should I use this medicine? This medicine is for injection into a vein. It is given by a health care professional in a hospital or clinic setting. Talk to your pediatrician regarding the use of this medicine in children. Special care may be needed. Overdosage: If you think you have taken too much of this medicine contact a poison control center or emergency room at once. NOTE: This medicine is only for you. Do not share this medicine with others. What if I miss a dose? It is important not to miss your dose. Call your doctor or health care professional if you are unable to keep an appointment. What may interact with this medicine? This medicine may interact with the following medications:  other iron products This list may not describe all possible interactions. Give your health care provider a list of all the medicines, herbs, non-prescription drugs, or dietary supplements you use. Also tell them if you smoke, drink alcohol, or use illegal drugs. Some items may interact with your medicine. What should I watch for while using this medicine? Visit your doctor or healthcare professional regularly. Tell your doctor or healthcare  professional if your symptoms do not start to get better or if they get worse. You may need blood work done while you are taking this medicine. You may need to follow a special diet. Talk to your doctor. Foods that contain iron include: whole grains/cereals, dried fruits, beans, or peas, leafy green vegetables, and organ meats (liver, kidney). What side effects may I notice from receiving this medicine? Side effects that you should report to your doctor or health care professional as soon as possible:  allergic reactions like skin rash, itching or hives, swelling of the face, lips, or tongue  breathing problems  changes in blood pressure  feeling faint or lightheaded, falls  fever or chills  flushing, sweating, or hot feelings  swelling of the ankles or feet Side effects that usually do not require medical attention (report to your doctor or health care professional if they continue or are bothersome):  diarrhea  headache  nausea, vomiting  stomach pain This list may not describe all possible side effects. Call your doctor for medical advice about side effects. You may report side effects to FDA at 1-800-FDA-1088. Where should I keep my medicine? This drug is given in a hospital or clinic and will not be stored at home. NOTE: This sheet is a summary. It may not cover all possible information. If you have questions about this medicine, talk to your doctor, pharmacist, or health care provider.  2021 Elsevier/Gold Standard (2016-04-12 20:21:10)  

## 2020-08-21 NOTE — Progress Notes (Signed)
Patient observed 30 minutes after iron infusion. Discharged in stable condition.

## 2020-08-22 DIAGNOSIS — N189 Chronic kidney disease, unspecified: Secondary | ICD-10-CM | POA: Diagnosis not present

## 2020-08-22 DIAGNOSIS — N390 Urinary tract infection, site not specified: Secondary | ICD-10-CM | POA: Diagnosis not present

## 2020-08-22 DIAGNOSIS — E871 Hypo-osmolality and hyponatremia: Secondary | ICD-10-CM | POA: Diagnosis not present

## 2020-08-22 DIAGNOSIS — R600 Localized edema: Secondary | ICD-10-CM | POA: Diagnosis not present

## 2020-08-22 DIAGNOSIS — E559 Vitamin D deficiency, unspecified: Secondary | ICD-10-CM | POA: Diagnosis not present

## 2020-08-22 DIAGNOSIS — I10 Essential (primary) hypertension: Secondary | ICD-10-CM | POA: Diagnosis not present

## 2020-10-06 DEATH — deceased
